# Patient Record
Sex: Female | Born: 1935 | Race: White | Hispanic: No | Marital: Single | Smoking: Never smoker
Health system: Southern US, Community
[De-identification: ages and names within clinical notes are randomized; demographics above are authoritative.]

## PROBLEM LIST (undated history)

## (undated) DIAGNOSIS — J449 Chronic obstructive pulmonary disease, unspecified: Secondary | ICD-10-CM

## (undated) DIAGNOSIS — I10 Essential (primary) hypertension: Secondary | ICD-10-CM

## (undated) DIAGNOSIS — H9190 Unspecified hearing loss, unspecified ear: Secondary | ICD-10-CM

## (undated) DIAGNOSIS — K449 Diaphragmatic hernia without obstruction or gangrene: Secondary | ICD-10-CM

## (undated) DIAGNOSIS — I340 Nonrheumatic mitral (valve) insufficiency: Secondary | ICD-10-CM

## (undated) DIAGNOSIS — Z8679 Personal history of other diseases of the circulatory system: Secondary | ICD-10-CM

## (undated) DIAGNOSIS — I499 Cardiac arrhythmia, unspecified: Secondary | ICD-10-CM

## (undated) DIAGNOSIS — G459 Transient cerebral ischemic attack, unspecified: Secondary | ICD-10-CM

## (undated) DIAGNOSIS — T7840XA Allergy, unspecified, initial encounter: Secondary | ICD-10-CM

## (undated) DIAGNOSIS — H269 Unspecified cataract: Secondary | ICD-10-CM

## (undated) DIAGNOSIS — M810 Age-related osteoporosis without current pathological fracture: Secondary | ICD-10-CM

## (undated) DIAGNOSIS — E785 Hyperlipidemia, unspecified: Secondary | ICD-10-CM

## (undated) DIAGNOSIS — R011 Cardiac murmur, unspecified: Secondary | ICD-10-CM

## (undated) DIAGNOSIS — Z973 Presence of spectacles and contact lenses: Secondary | ICD-10-CM

## (undated) DIAGNOSIS — H409 Unspecified glaucoma: Secondary | ICD-10-CM

## (undated) DIAGNOSIS — Z889 Allergy status to unspecified drugs, medicaments and biological substances status: Secondary | ICD-10-CM

## (undated) DIAGNOSIS — K219 Gastro-esophageal reflux disease without esophagitis: Secondary | ICD-10-CM

## (undated) DIAGNOSIS — Z862 Personal history of diseases of the blood and blood-forming organs and certain disorders involving the immune mechanism: Secondary | ICD-10-CM

## (undated) HISTORY — PX: BREAST SURGERY: SHX581

## (undated) HISTORY — DX: Unspecified cataract: H26.9

## (undated) HISTORY — DX: Transient cerebral ischemic attack, unspecified: G45.9

## (undated) HISTORY — DX: Unspecified glaucoma: H40.9

## (undated) HISTORY — DX: Allergy, unspecified, initial encounter: T78.40XA

## (undated) HISTORY — DX: Hyperlipidemia, unspecified: E78.5

## (undated) HISTORY — DX: Diaphragmatic hernia without obstruction or gangrene: K44.9

## (undated) HISTORY — PX: APPENDECTOMY: SHX54

## (undated) HISTORY — DX: Cardiac murmur, unspecified: R01.1

## (undated) HISTORY — DX: Cardiac arrhythmia, unspecified: I49.9

## (undated) HISTORY — DX: Personal history of diseases of the blood and blood-forming organs and certain disorders involving the immune mechanism: Z86.2

## (undated) HISTORY — DX: Chronic obstructive pulmonary disease, unspecified: J44.9

## (undated) HISTORY — DX: Nonrheumatic mitral (valve) insufficiency: I34.0

## (undated) HISTORY — DX: Allergy status to unspecified drugs, medicaments and biological substances: Z88.9

## (undated) HISTORY — PX: OTHER SURGICAL HISTORY: SHX169

## (undated) HISTORY — DX: Essential (primary) hypertension: I10

## (undated) HISTORY — DX: Gastro-esophageal reflux disease without esophagitis: K21.9

## (undated) HISTORY — DX: Personal history of other diseases of the circulatory system: Z86.79

## (undated) HISTORY — PX: TONSILLECTOMY AND ADENOIDECTOMY: SHX28

## (undated) HISTORY — DX: Age-related osteoporosis without current pathological fracture: M81.0

## (undated) HISTORY — PX: EYE SURGERY: SHX253

---

## 2004-11-23 ENCOUNTER — Emergency Department (HOSPITAL_COMMUNITY): Admission: EM | Admit: 2004-11-23 | Discharge: 2004-11-23 | Payer: Self-pay | Admitting: Emergency Medicine

## 2004-11-23 DIAGNOSIS — K449 Diaphragmatic hernia without obstruction or gangrene: Secondary | ICD-10-CM | POA: Insufficient documentation

## 2006-04-26 HISTORY — PX: CORNEAL TRANSPLANT: SHX108

## 2009-11-01 ENCOUNTER — Encounter: Admission: RE | Admit: 2009-11-01 | Discharge: 2009-11-01 | Payer: Self-pay | Admitting: Cardiology

## 2010-06-11 ENCOUNTER — Ambulatory Visit: Payer: Self-pay | Admitting: Cardiology

## 2010-06-13 ENCOUNTER — Ambulatory Visit: Payer: Self-pay | Admitting: Cardiology

## 2010-06-20 ENCOUNTER — Ambulatory Visit: Payer: Self-pay | Admitting: Cardiology

## 2010-12-24 ENCOUNTER — Ambulatory Visit (INDEPENDENT_AMBULATORY_CARE_PROVIDER_SITE_OTHER): Payer: Medicare Other | Admitting: Cardiology

## 2010-12-24 DIAGNOSIS — E7800 Pure hypercholesterolemia, unspecified: Secondary | ICD-10-CM

## 2010-12-24 DIAGNOSIS — Z79899 Other long term (current) drug therapy: Secondary | ICD-10-CM

## 2010-12-24 DIAGNOSIS — I119 Hypertensive heart disease without heart failure: Secondary | ICD-10-CM

## 2010-12-24 DIAGNOSIS — D509 Iron deficiency anemia, unspecified: Secondary | ICD-10-CM

## 2010-12-25 ENCOUNTER — Ambulatory Visit: Payer: Self-pay | Admitting: Cardiology

## 2011-08-04 ENCOUNTER — Encounter: Payer: Self-pay | Admitting: *Deleted

## 2011-08-04 DIAGNOSIS — Z862 Personal history of diseases of the blood and blood-forming organs and certain disorders involving the immune mechanism: Secondary | ICD-10-CM | POA: Insufficient documentation

## 2011-08-04 DIAGNOSIS — I340 Nonrheumatic mitral (valve) insufficiency: Secondary | ICD-10-CM | POA: Insufficient documentation

## 2011-08-04 DIAGNOSIS — Z889 Allergy status to unspecified drugs, medicaments and biological substances status: Secondary | ICD-10-CM | POA: Insufficient documentation

## 2011-08-04 DIAGNOSIS — Z8679 Personal history of other diseases of the circulatory system: Secondary | ICD-10-CM | POA: Insufficient documentation

## 2011-08-04 DIAGNOSIS — K219 Gastro-esophageal reflux disease without esophagitis: Secondary | ICD-10-CM | POA: Insufficient documentation

## 2011-08-04 DIAGNOSIS — K449 Diaphragmatic hernia without obstruction or gangrene: Secondary | ICD-10-CM | POA: Insufficient documentation

## 2011-08-05 ENCOUNTER — Encounter: Payer: Self-pay | Admitting: Cardiology

## 2011-08-05 ENCOUNTER — Ambulatory Visit (INDEPENDENT_AMBULATORY_CARE_PROVIDER_SITE_OTHER): Payer: Medicare Other | Admitting: Cardiology

## 2011-08-05 ENCOUNTER — Other Ambulatory Visit: Payer: Self-pay | Admitting: *Deleted

## 2011-08-05 VITALS — BP 167/73 | HR 70 | Wt 141.1 lb

## 2011-08-05 DIAGNOSIS — I059 Rheumatic mitral valve disease, unspecified: Secondary | ICD-10-CM

## 2011-08-05 DIAGNOSIS — E7800 Pure hypercholesterolemia, unspecified: Secondary | ICD-10-CM

## 2011-08-05 DIAGNOSIS — I340 Nonrheumatic mitral (valve) insufficiency: Secondary | ICD-10-CM

## 2011-08-05 DIAGNOSIS — I119 Hypertensive heart disease without heart failure: Secondary | ICD-10-CM

## 2011-08-05 DIAGNOSIS — Z862 Personal history of diseases of the blood and blood-forming organs and certain disorders involving the immune mechanism: Secondary | ICD-10-CM

## 2011-08-05 LAB — BASIC METABOLIC PANEL WITH GFR
BUN: 16 mg/dL (ref 6–23)
CO2: 24 meq/L (ref 19–32)
Calcium: 9 mg/dL (ref 8.4–10.5)
Chloride: 112 meq/L (ref 96–112)
Creatinine, Ser: 0.9 mg/dL (ref 0.4–1.2)
GFR: 61.55 mL/min
Glucose, Bld: 101 mg/dL — ABNORMAL HIGH (ref 70–99)
Potassium: 4.9 meq/L (ref 3.5–5.1)
Sodium: 144 meq/L (ref 135–145)

## 2011-08-05 LAB — LIPID PANEL
Cholesterol: 179 mg/dL (ref 0–200)
HDL: 57.8 mg/dL
LDL Cholesterol: 95 mg/dL (ref 0–99)
Total CHOL/HDL Ratio: 3
Triglycerides: 133 mg/dL (ref 0.0–149.0)
VLDL: 26.6 mg/dL (ref 0.0–40.0)

## 2011-08-05 LAB — HEPATIC FUNCTION PANEL
Albumin: 4 g/dL (ref 3.5–5.2)
Alkaline Phosphatase: 62 U/L (ref 39–117)
Total Bilirubin: 0.8 mg/dL (ref 0.3–1.2)

## 2011-08-05 NOTE — Progress Notes (Signed)
[name] Date of Birth:  [birth year] [facility] / [facility] [address]   [address] [place], [place]  [address] [phone]           Fax   [phone]  History of Present Illness: This pleasant 77 year old, Caucasian female is seen for a six-month followup office visit.  She has been widowed since June of 2010.  The patient has a history of essential hypertension.  She also has a history of mitral valve disease with atrial arrhythmias.  She has a history of essential hypertension with diastolic dysfunction.  She is now on lisinopril.  She feels that the lisinopril has helped her palpitations and she has not been aware of any recent palpitations or extrasystoles.  Current Outpatient Prescriptions  Medication Sig Dispense Refill  . aspirin EC 81 MG tablet Take 81 mg by mouth daily.        . Calcium Carbonate-Vitamin D (CALTRATE 600+D PO) Take by mouth.        . cholecalciferol (VITAMIN D) 1000 UNITS tablet Take 1,000 Units by mouth daily.       [name] Calcium (STOOL SOFTENER PO) Take by mouth. Takes prn       . fish oil-omega-3 fatty acids 1000 MG capsule Take 2 g by mouth daily.        Marland Kitchen lisinopril (PRINIVIL,ZESTRIL) 10 MG tablet Take 10 mg by mouth daily.        . methocarbamol (ROBAXIN) 500 MG tablet Take 500 mg by mouth 4 (four) times daily. Takes prn       . Multiple Vitamin (MULTIVITAMIN) tablet Take 1 tablet by mouth daily.        . naproxen sodium (ANAPROX) 220 MG tablet Take 220 mg by mouth 2 (two) times daily with a meal. Takes prn       . prednisoLONE acetate (PRED FORTE) 1 % ophthalmic suspension Place 1 drop into the right eye 2 (two) times daily.       . rosuvastatin (CRESTOR) 10 MG tablet Take 10 mg by mouth daily.        . Timolol Maleate (ISTALOL) 0.5 % (DAILY) SOLN Apply to eye. 1 drop right  eye twice a day-and- 1 drop left eye once a day         Allergies  Allergen Reactions  . Lipitor (Atorvastatin Calcium)   . Zocor (Simvastatin)      Patient Active Problem List  Diagnoses  . Mitral regurgitation  . History of anemia  . History of diastolic dysfunction  . Reflux  . History of seasonal allergies  . Benign hypertensive heart disease without heart failure  . Pure hypercholesterolemia    History  Smoking status  . Never Smoker   Smokeless tobacco  . Not on file    History  Alcohol Use     Family History  Problem Relation Age of Onset  . Heart disease Mother   . Emphysema Father   . Emphysema Brother     Review of Systems: Constitutional: no fever chills diaphoresis or fatigue or change in weight.  Head and neck: no hearing loss, no epistaxis, no photophobia or visual disturbance. Respiratory: No cough, shortness of breath or wheezing. Cardiovascular: No chest pain peripheral edema, palpitations. Gastrointestinal: No abdominal distention, no abdominal pain, no change in bowel habits hematochezia or melena. Genitourinary: No dysuria, no frequency, no urgency, no nocturia. Musculoskeletal:No arthralgias, no back pain, no gait disturbance or myalgias. Neurological: No dizziness, no headaches, no  numbness, no seizures, no syncope, no weakness, no tremors. Hematologic: No lymphadenopathy, no easy bruising. Psychiatric: No confusion, no hallucinations, no sleep disturbance.    Physical Exam: Filed Vitals:   08/05/11 0909  BP: 167/73  Pulse: 70   The general appearance reveals a well-developed, well-nourished, woman, in no distressPupils equal and reactive.   Extraocular Movements are full.  There is no scleral icterus.  The mouth and pharynx are normal.  The neck is supple.  The carotids reveal no bruits.  The jugular venous pressure is normal.  The thyroid is not enlarged.  There is no lymphadenopathy.  The chest is clear to percussion and auscultation. There are no rales or rhonchi. Expansion of the chest is symmetrical.  The precordium is quiet.  The first heart sound is normal.  The second heart  sound is physiologically split.  There is no  gallop rub or click.  There is a faint apical systolic murmur of mitral regurgitation, grade 1/6 There is no abnormal lift or heave.The abdomen is soft and nontender. Bowel sounds are normal. The liver and spleen are not enlarged. There Are no abdominal masses. There are no bruits.  The pedal pulses are good.  There is no phlebitis or edema.  There is no cyanosis or clubbing. Strength is normal and symmetrical in all extremities.  There is no lateralizing weakness.  There are no sensory deficits.  Integument reveals a mole on her left forehead and temple area, but she is concerned about.  The nevus is dark.  She will consult her dermatologist  EKG shows normal sinus rhythm and first degree AV block, unchanged from February 2012  Assessment / Plan: The patient is to continue same medication.  Recheck in 6 months for followup office visit and lab work.

## 2011-08-05 NOTE — Assessment & Plan Note (Signed)
The patient has a history of mitral regurgitation.  She has not been having symptoms of congestive heart failure.  She's not having any orthopnea or paroxysmal nocturnal dyspnea.  His electrocardiogram today which is unchanged from February 2012.  She does have first degree AV block, which is unchanged.

## 2011-08-05 NOTE — Assessment & Plan Note (Signed)
The patient has a past history of anemia.  On a previous visit recheck vitamin B 12 and serum folate and serum iron levels.  All of which were negative.  She has not been aware of any hematochezia or melena.  We will plan to recheck a CBC on her next visit.

## 2011-08-05 NOTE — Patient Instructions (Signed)
continue same dose of medications  Your physician wants you to follow-up in: 6 months You will receive a reminder letter in the mail two months in advance. If you don't receive a letter, please call our office to schedule the follow-up appointment.  

## 2011-08-05 NOTE — Progress Notes (Signed)
Addended by: Alma Friendly on: 08/05/2011 09:59 AM   Modules accepted: Orders

## 2011-08-05 NOTE — Assessment & Plan Note (Signed)
Patient has a history of essential hypertension.  She has been doing better since put her on lisinopril.  This may also have decreased the intensity of her mitral regurgitation.

## 2011-08-08 ENCOUNTER — Telehealth: Payer: Self-pay | Admitting: *Deleted

## 2011-08-08 NOTE — Progress Notes (Signed)
Advised of labs 

## 2011-08-08 NOTE — Telephone Encounter (Signed)
Advised and mailed copy  

## 2011-08-08 NOTE — Telephone Encounter (Signed)
Message copied by Burnell Blanks on Fri Aug 08, 2011  5:34 PM ------      Message from: Cassell Clement      Created: Tue Aug 05, 2011  6:31 PM       Please report.  Her lipids are good.  The cholesterol is 179.  Triglycerides are 133 and the LDL is 95.  The liver and kidney tests are normal.  Continue same medication.

## 2011-08-29 ENCOUNTER — Other Ambulatory Visit: Payer: Self-pay | Admitting: *Deleted

## 2011-08-29 MED ORDER — ROSUVASTATIN CALCIUM 10 MG PO TABS: 10.0000 mg | ORAL_TABLET | Freq: Every day | ORAL | Status: DC

## 2011-10-22 ENCOUNTER — Ambulatory Visit (INDEPENDENT_AMBULATORY_CARE_PROVIDER_SITE_OTHER): Payer: Medicare Other

## 2011-10-22 ENCOUNTER — Telehealth: Payer: Self-pay | Admitting: Cardiology

## 2011-10-22 DIAGNOSIS — J209 Acute bronchitis, unspecified: Secondary | ICD-10-CM

## 2011-10-22 DIAGNOSIS — R059 Cough, unspecified: Secondary | ICD-10-CM

## 2011-10-22 DIAGNOSIS — R05 Cough: Secondary | ICD-10-CM

## 2011-10-22 DIAGNOSIS — D649 Anemia, unspecified: Secondary | ICD-10-CM

## 2011-10-22 NOTE — Telephone Encounter (Signed)
Pt called with increased cough x 2 weeks which is only occasionally slightly productive.  She states she is starting to get sob with exertion and increased coughing.  She states she has been taking Mucinex DM.  I recommended that she call her pcp (doesn't have one), or in absence of not having a pcp to go to Urgent Care this am.

## 2011-10-22 NOTE — Telephone Encounter (Signed)
New message:  Pt has had cold/cough for 2 weeks.  She still is coughing/non productive/but short of breath with any exertion.  No fever to her knowledge.  Has taken Mucinex DM but just does not feel well.Please call and advise.

## 2011-10-29 ENCOUNTER — Telehealth: Payer: Self-pay | Admitting: Cardiology

## 2011-10-29 DIAGNOSIS — R05 Cough: Secondary | ICD-10-CM

## 2011-10-29 NOTE — Telephone Encounter (Signed)
Follow-up:    Patient still is short of breath and has the constant cough after finishing her Z-pak and the urgent care doctor telling her she has bronchitis.  She would like to know how to proceed from here because she isn't getting any better. Please advise.

## 2011-10-29 NOTE — Telephone Encounter (Signed)
Finished Zpak Sunday night.  Dry and wet cough.  Short of breath with the least bit of exertion.  Denies fever.  Xray at [facility] showed "infiltrate" that he said he would call infection.  This started with sore throat on 12/10 and has just gotten worse.  WBC was normal at urgent care.

## 2011-10-29 NOTE — Telephone Encounter (Signed)
Put in order for chest xray and advised patient of xray and office visit

## 2011-10-29 NOTE — Telephone Encounter (Signed)
Her symptoms could be some early CHF.  Consider an office visit and get another chest x-ray ahead of time.  We'll also consider getting a B. natruretic peptide and other labs after we see her.

## 2011-10-30 ENCOUNTER — Encounter: Payer: Self-pay | Admitting: Cardiology

## 2011-10-30 ENCOUNTER — Ambulatory Visit (INDEPENDENT_AMBULATORY_CARE_PROVIDER_SITE_OTHER): Payer: BC Managed Care – PPO | Admitting: Cardiology

## 2011-10-30 ENCOUNTER — Ambulatory Visit (INDEPENDENT_AMBULATORY_CARE_PROVIDER_SITE_OTHER)
Admission: RE | Admit: 2011-10-30 | Discharge: 2011-10-30 | Disposition: A | Discharge status: Home / Self Care | Payer: Medicare Other | Source: Ambulatory Visit | Attending: Cardiology | Admitting: Cardiology

## 2011-10-30 VITALS — BP 124/80 | HR 66 | Ht 63.0 in | Wt 138.0 lb

## 2011-10-30 DIAGNOSIS — J40 Bronchitis, not specified as acute or chronic: Secondary | ICD-10-CM | POA: Diagnosis not present

## 2011-10-30 DIAGNOSIS — R0602 Shortness of breath: Secondary | ICD-10-CM | POA: Diagnosis not present

## 2011-10-30 DIAGNOSIS — R059 Cough, unspecified: Secondary | ICD-10-CM

## 2011-10-30 DIAGNOSIS — I059 Rheumatic mitral valve disease, unspecified: Secondary | ICD-10-CM | POA: Diagnosis not present

## 2011-10-30 DIAGNOSIS — R0989 Other specified symptoms and signs involving the circulatory and respiratory systems: Secondary | ICD-10-CM | POA: Diagnosis not present

## 2011-10-30 DIAGNOSIS — I119 Hypertensive heart disease without heart failure: Secondary | ICD-10-CM

## 2011-10-30 DIAGNOSIS — R05 Cough: Secondary | ICD-10-CM | POA: Diagnosis not present

## 2011-10-30 DIAGNOSIS — I340 Nonrheumatic mitral (valve) insufficiency: Secondary | ICD-10-CM

## 2011-10-30 MED ORDER — AMOXICILLIN 500 MG PO CAPS: 500.0000 mg | ORAL_CAPSULE | Freq: Three times a day (TID) | ORAL | Status: AC

## 2011-10-30 NOTE — Assessment & Plan Note (Signed)
The patient has a history of mitral regurgitation.  She gives a past history of rheumatic fever.  Her echocardiogram 03/19/10 showed mild to moderate mitral regurgitation and normal pulmonary artery pressure.  He has not had atrial fibrillation

## 2011-10-30 NOTE — Patient Instructions (Signed)
Will plan on seeing you around may, letter to be mailed to call for appointment. Amoxil Rx has been sent to [facility] Continue all other medications

## 2011-10-30 NOTE — Progress Notes (Signed)
Tina Mercado Date of Birth:  April 10, 1929 [facility] / [facility] [address]   [address] [place], [place]  [address] [phone]           Fax   [phone]  HPI: This pleasant 78 year old widowed Caucasian female is seen for a work in the office visit.  She has been having trouble getting over a case of severe bronchitis.  She has been sick for more than a week.  She finished up a Zithromax Z-Pak 5 days ago.  She continues to have a harsh mainly nonproductive cough.  On Christmas day she coughs so hard that she almost blacked out.  The following day she went to urgent care at [facility] and a chest x-ray at that time was negative for pneumonia.  She also had lab work at urgent care which was unremarkable including a hemoglobin.  Current Outpatient Prescriptions  Medication Sig Dispense Refill  . aspirin EC 81 MG tablet Take 81 mg by mouth daily.        . Calcium Carbonate-Vitamin D (CALTRATE 600+D PO) Take by mouth.        . cholecalciferol (VITAMIN D) 1000 UNITS tablet Take 1,000 Units by mouth daily.       [name] Calcium (STOOL SOFTENER PO) Take by mouth. Takes prn       . fish oil-omega-3 fatty acids 1000 MG capsule Take 2 g by mouth daily.        Marland Kitchen lisinopril (PRINIVIL,ZESTRIL) 10 MG tablet Take 10 mg by mouth daily.        . methocarbamol (ROBAXIN) 500 MG tablet Take 500 mg by mouth 4 (four) times daily. Takes prn       . Multiple Vitamin (MULTIVITAMIN) tablet Take 1 tablet by mouth daily.        . naproxen sodium (ANAPROX) 220 MG tablet Take 220 mg by mouth 2 (two) times daily with a meal. Takes prn       . prednisoLONE acetate (PRED FORTE) 1 % ophthalmic suspension Place 1 drop into the right eye 2 (two) times daily.       . rosuvastatin (CRESTOR) 10 MG tablet Take 1 tablet (10 mg total) by mouth daily.  30 tablet  6  . Timolol Maleate (ISTALOL) 0.5 % (DAILY) SOLN Apply to eye. 1 drop right  eye twice a day-and- 1 drop left eye once a day       . amoxicillin  (AMOXIL) 500 MG capsule Take 1 capsule (500 mg total) by mouth 3 (three) times daily.  20 capsule  0    Allergies  Allergen Reactions  . Celebrex (Celecoxib)     edema  . Darvocet (Propoxyphene N-Acetaminophen)     nausea  . Demerol     nausea  . Feldene (Piroxicam)     edema  . Lipitor (Atorvastatin Calcium)   . Zocor (Simvastatin)     Patient Active Problem List  Diagnoses  . Mitral regurgitation  . History of anemia  . History of diastolic dysfunction  . Reflux  . History of seasonal allergies  . Benign hypertensive heart disease without heart failure  . Pure hypercholesterolemia    History  Smoking status  . Never Smoker   Smokeless tobacco  . Not on file    History  Alcohol Use     Family History  Problem Relation Age of Onset  . Heart disease Mother   . Emphysema Father   . Emphysema Brother  Review of Systems: The patient denies any heat or cold intolerance.  No weight gain or weight loss.  The patient denies headaches or blurry vision.  There is no cough or sputum production.  The patient denies dizziness.  There is no hematuria or hematochezia.  The patient denies any muscle aches or arthritis.  The patient denies any rash.  The patient denies frequent falling or instability.  There is no history of depression or anxiety.  All other systems were reviewed and are negative.   Physical Exam: Filed Vitals:   10/30/11 1117  BP: 124/80  Pulse: 66    the general appearance reveals a healthy-appearing woman in no distress.  She does have a harsh repetitive nonproductive cough.Pupils equal and reactive.   Extraocular Movements are full.  There is no scleral icterus.  The mouth and pharynx are normal.  The neck is supple.  The carotids reveal no bruits.  The jugular venous pressure is normal.  The thyroid is not enlarged.  There is no lymphadenopathy.  Chest reveals a few rhonchi at the right base.  The heart reveals a soft apical systolic murmur of mitral  regurgitation.The abdomen is soft and nontender. Bowel sounds are normal. The liver and spleen are not enlarged. There Are no abdominal masses. There are no bruits.  The pedal pulses are good.  There is no phlebitis or edema.  There is no cyanosis or clubbing. Strength is normal and symmetrical in all extremities.  There is no lateralizing weakness.  There are no sensory deficits.  Integument reveals no rash   Assessment / Plan: Continue Mucinex 600 mg once or twice a day with plenty of water.  Treat the persistent bronchitis with amoxicillin 500 mg 3 times a day #20.  Continue to get plenty of rest at home.  She will keep her regular appointment is scheduled.

## 2011-10-30 NOTE — Assessment & Plan Note (Signed)
The patient has not been having any symptoms referable to her history of high blood pressure.  Blood pressure today is normal.

## 2011-10-30 NOTE — Assessment & Plan Note (Signed)
Because of her persistent symptoms and cough we repeated a chest x-ray today.  It shows no active cardiopulmonary disease.  Specifically no evidence of congestive heart failure or pneumonia.  He has not been running a fever.  She has not had any hemoptysis.  She does have some pleuritic chest pain.

## 2011-11-24 ENCOUNTER — Telehealth: Payer: Self-pay | Admitting: Cardiology

## 2011-11-24 NOTE — Telephone Encounter (Signed)
Left message, need to schedule for ov

## 2011-11-24 NOTE — Telephone Encounter (Signed)
Yes see  for recheck soon

## 2011-11-24 NOTE — Telephone Encounter (Signed)
?   See for recheck

## 2011-11-24 NOTE — Telephone Encounter (Signed)
Seen for recheck soon

## 2011-11-24 NOTE — Telephone Encounter (Signed)
New problem Pt said her cough is not better and has been having some sob on exertion please call her back

## 2011-11-24 NOTE — Telephone Encounter (Signed)
Appointment scheduled for 1/29

## 2011-11-25 ENCOUNTER — Other Ambulatory Visit: Payer: Self-pay | Admitting: Cardiology

## 2011-11-25 ENCOUNTER — Other Ambulatory Visit: Payer: Self-pay | Admitting: *Deleted

## 2011-11-25 ENCOUNTER — Encounter: Payer: Self-pay | Admitting: Cardiology

## 2011-11-25 ENCOUNTER — Ambulatory Visit (INDEPENDENT_AMBULATORY_CARE_PROVIDER_SITE_OTHER): Payer: Medicare Other | Admitting: Cardiology

## 2011-11-25 DIAGNOSIS — E7800 Pure hypercholesterolemia, unspecified: Secondary | ICD-10-CM | POA: Diagnosis not present

## 2011-11-25 DIAGNOSIS — J40 Bronchitis, not specified as acute or chronic: Secondary | ICD-10-CM

## 2011-11-25 DIAGNOSIS — I119 Hypertensive heart disease without heart failure: Secondary | ICD-10-CM | POA: Diagnosis not present

## 2011-11-25 NOTE — Patient Instructions (Signed)
Your physician recommends that you schedule a follow-up appointment in: 3 months Your physician recommends that you continue on your current medications as directed. Please refer to the Current Medication list given to you today. If no better or worse call back

## 2011-11-25 NOTE — Progress Notes (Signed)
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  We had seen her for a work in on 10/30/11 because of a severe cough.  He has a past history of essential hypertension and history of mitral regurgitation.  He has not had clinical congestive heart failure.  Current Outpatient Prescriptions  Medication Sig Dispense Refill  . aspirin EC 81 MG tablet Take 81 mg by mouth daily.        . Calcium Carbonate-Vitamin D (CALTRATE 600+D PO) Take 1 tablet by mouth daily.       . cholecalciferol (VITAMIN D) 1000 UNITS tablet Take 1,000 Units by mouth daily.       [name] Calcium (STOOL SOFTENER PO) Take by mouth. Takes prn       . fish oil-omega-3 fatty acids 1000 MG capsule Take 2 g by mouth daily.        Marland Kitchen lisinopril (PRINIVIL,ZESTRIL) 10 MG tablet TAKE 1 TABLET EVERY DAY  30 tablet  5  . methocarbamol (ROBAXIN) 500 MG tablet Take 500 mg by mouth 4 (four) times daily. Takes prn       . Multiple Vitamin (MULTIVITAMIN) tablet Take 1 tablet by mouth daily.        . naproxen sodium (ANAPROX) 220 MG tablet Take 220 mg by mouth 2 (two) times daily with a meal. Takes prn       . prednisoLONE acetate (PRED FORTE) 1 % ophthalmic suspension Place 1 drop into the right eye 2 (two) times daily.       . rosuvastatin (CRESTOR) 10 MG tablet Take 1 tablet (10 mg total) by mouth daily.  30 tablet  6  . Timolol Maleate (ISTALOL) 0.5 % (DAILY) SOLN Apply to eye. 1 drop right  eye twice a day-and- 1 drop left eye once a day         Allergies  Allergen Reactions  . Celebrex (Celecoxib)     edema  . Darvocet (Propoxyphene N-Acetaminophen)     nausea  . Demerol     nausea  . Feldene (Piroxicam)     edema  . Lipitor (Atorvastatin Calcium)   . Zocor (Simvastatin)     Patient Active Problem List  Diagnoses  .  Mitral regurgitation  . History of anemia  . History of diastolic dysfunction  . Reflux  . History of seasonal allergies  . Benign hypertensive heart disease without heart failure  . Pure hypercholesterolemia  . Bronchitis    History  Smoking status  . Never Smoker   Smokeless tobacco  . Not on file    History  Alcohol Use     Family History  Problem Relation Age of Onset  . Heart disease Mother   . Emphysema Father   . Emphysema Brother     Review of Systems: The patient denies any heat or cold intolerance.  No weight gain or weight loss.  The patient denies headaches or blurry vision.  There is no cough or sputum production.  The patient denies dizziness.  There is no hematuria or hematochezia.  The patient denies any muscle aches or arthritis.  The patient denies any rash.  The patient denies frequent falling or instability.  There is no history of depression or anxiety.  All other systems were reviewed and are negative.   Physical  Exam: Filed Vitals:   11/25/11 1020  BP: 154/78  Pulse: 74   the general appearance reveals a well-developed well-nourished woman in no distress.  She is having paroxysms of dry nonproductive cough.Pupils equal and reactive.   Extraocular Movements are full.  There is no scleral icterus.  The mouth and pharynx are normal.  The neck is supple.  The carotids reveal no bruits.  The jugular venous pressure is normal.  The thyroid is not enlarged.  There is no lymphadenopathy.  The chest is clear to percussion and auscultation. There are no rales or rhonchi. Expansion of the chest is symmetrical.  The precordium is quiet.  The first heart sound is normal.  The second heart sound is physiologically split.  There is no gallop rub or click.  There is a faint apical systolic murmur. There is no abnormal lift or heave.The abdomen is soft and nontender. Bowel sounds are normal. The liver and spleen are not enlarged. There Are no abdominal masses. There are  no bruits.  The pedal pulses are good.  There is no phlebitis or edema.  There is no cyanosis or clubbing. Strength is normal and symmetrical in all extremities.  There is no lateralizing weakness.  There are no sensory deficits.  The skin is warm and dry.  There is no rash.       Assessment / Plan: She will restart Mucinex 600 mg twice a day.  If she needs cough suppression she will use Mucinex DM.  For her blood pressure she is to decrease salt intake in her diet.  Will continue same dose of lisinopril for now but consider switch to an ARB if cough persists.  Recheck in 3 months for followup office visit.  She has had 2 recent chest x-rays so there is no need for any further chest x-ray at this time.  Hopefully her cough will just gradually wear itself out.

## 2011-11-25 NOTE — Assessment & Plan Note (Signed)
The patient has not been having any symptoms referable to her blood pressure.  Blood pressure has been mildly elevated since last visit.  No headaches or dizzy spells.  No symptoms of CHF.

## 2011-11-25 NOTE — Assessment & Plan Note (Signed)
The patient continues to have a severe paroxysmal nonproductive cough.  She has had several rounds of antibiotics.  Her most recent round was amoxicillin which seemed to help while she was on it but several days after stopping in her cough returned.  She is now bringing up any sputum but her cough sounds wet to her.  We discussed the possibility that her ACE inhibitor could be a contributing factor but she felt that to be unlikely since she's been on the ACE inhibitor for a long time and also was not having any cough until she had the onset of clinical bronchitis last month.  If the cough persists then we will consider the option of switching her off her ACE inhibitor and using an ARB instead.

## 2011-11-25 NOTE — Telephone Encounter (Signed)
Fax Received. Refill Completed. Bristol Osentoski Chowoe (R.M.A)   

## 2011-11-25 NOTE — Assessment & Plan Note (Signed)
Patient has a history of hypercholesterolemia and is on Crestor 10 mg daily.  She's not having any side effects Crestor.

## 2012-01-27 DIAGNOSIS — B079 Viral wart, unspecified: Secondary | ICD-10-CM | POA: Diagnosis not present

## 2012-01-27 DIAGNOSIS — D235 Other benign neoplasm of skin of trunk: Secondary | ICD-10-CM | POA: Diagnosis not present

## 2012-01-27 DIAGNOSIS — Z85828 Personal history of other malignant neoplasm of skin: Secondary | ICD-10-CM | POA: Diagnosis not present

## 2012-02-24 ENCOUNTER — Encounter: Payer: Self-pay | Admitting: Cardiology

## 2012-02-24 ENCOUNTER — Ambulatory Visit (INDEPENDENT_AMBULATORY_CARE_PROVIDER_SITE_OTHER): Payer: Medicare Other | Admitting: Cardiology

## 2012-02-24 VITALS — BP 148/90 | HR 60 | Ht 63.0 in | Wt 136.8 lb

## 2012-02-24 DIAGNOSIS — I059 Rheumatic mitral valve disease, unspecified: Secondary | ICD-10-CM

## 2012-02-24 DIAGNOSIS — M810 Age-related osteoporosis without current pathological fracture: Secondary | ICD-10-CM

## 2012-02-24 DIAGNOSIS — E7800 Pure hypercholesterolemia, unspecified: Secondary | ICD-10-CM | POA: Diagnosis not present

## 2012-02-24 DIAGNOSIS — I340 Nonrheumatic mitral (valve) insufficiency: Secondary | ICD-10-CM

## 2012-02-24 DIAGNOSIS — I4949 Other premature depolarization: Secondary | ICD-10-CM | POA: Diagnosis not present

## 2012-02-24 DIAGNOSIS — I493 Ventricular premature depolarization: Secondary | ICD-10-CM

## 2012-02-24 DIAGNOSIS — I119 Hypertensive heart disease without heart failure: Secondary | ICD-10-CM

## 2012-02-24 LAB — BASIC METABOLIC PANEL
BUN: 14 mg/dL (ref 6–23)
Chloride: 110 mEq/L (ref 96–112)
Glucose, Bld: 106 mg/dL — ABNORMAL HIGH (ref 70–99)
Potassium: 3.8 mEq/L (ref 3.5–5.1)

## 2012-02-24 NOTE — Progress Notes (Signed)
Tina Mercado Date of Birth:  [birth year] [facility] [address] [place], [place]  [address] [phone]         Fax   [phone]  History of Present Illness: This pleasant 78 year old woman is seen for a scheduled followup office visit.  She has a past history of mitral regurgitation and a history of essential hypertension.  She's also had a history of hypercholesterolemia.  Since last visit she has had no new cardiac symptoms.  She has not been getting any regular exercise.  She denies any chest pain.  She has noted some palpitations.  She has been drinking occasional cups of regular coffee.  Current Outpatient Prescriptions  Medication Sig Dispense Refill  . aspirin EC 81 MG tablet Take 81 mg by mouth daily.        . Calcium Carbonate-Vitamin D (CALTRATE 600+D PO) Take 1 tablet by mouth daily.       . cholecalciferol (VITAMIN D) 1000 UNITS tablet Take 1,000 Units by mouth daily.       [name] Calcium (STOOL SOFTENER PO) Take by mouth. Takes prn       . fish oil-omega-3 fatty acids 1000 MG capsule Take 2 g by mouth daily.        Marland Kitchen lisinopril (PRINIVIL,ZESTRIL) 10 MG tablet TAKE 1 TABLET EVERY DAY  30 tablet  5  . methocarbamol (ROBAXIN) 500 MG tablet Take 500 mg by mouth 4 (four) times daily. Takes prn       . Multiple Vitamin (MULTIVITAMIN) tablet Take 1 tablet by mouth daily.        . naproxen sodium (ANAPROX) 220 MG tablet Take 220 mg by mouth 2 (two) times daily with a meal. Takes prn       . prednisoLONE acetate (PRED FORTE) 1 % ophthalmic suspension Place 1 drop into the right eye 2 (two) times daily.       . rosuvastatin (CRESTOR) 10 MG tablet Take 1 tablet (10 mg total) by mouth daily.  30 tablet  6  . Timolol Maleate (ISTALOL) 0.5 % (DAILY) SOLN Apply to eye. 1 drop right  eye twice a day-and- 1 drop left eye once a day         Allergies  Allergen Reactions  . Celebrex (Celecoxib)     edema  . Darvocet (Propoxyphene-Acetaminophen)    nausea  . Demerol     nausea  . Feldene (Piroxicam)     edema  . Lipitor (Atorvastatin Calcium)   . Zocor (Simvastatin)     Patient Active Problem List  Diagnoses  . Mitral regurgitation  . History of anemia  . History of diastolic dysfunction  . Reflux  . History of seasonal allergies  . Benign hypertensive heart disease without heart failure  . Pure hypercholesterolemia  . Bronchitis    History  Smoking status  . Never Smoker   Smokeless tobacco  . Not on file    History  Alcohol Use     Family History  Problem Relation Age of Onset  . Heart disease Mother   . Emphysema Father   . Emphysema Brother     Review of Systems: Constitutional: no fever chills diaphoresis or fatigue or change in weight.  Head and neck: no hearing loss, no epistaxis, no photophobia or visual disturbance. Respiratory: No cough, shortness of breath or wheezing. Cardiovascular: No chest pain peripheral edema, palpitations. Gastrointestinal: No abdominal distention, no abdominal pain, no change in bowel habits hematochezia or melena.  Genitourinary: No dysuria, no frequency, no urgency, no nocturia. Musculoskeletal:No arthralgias, no back pain, no gait disturbance or myalgias. Neurological: No dizziness, no headaches, no numbness, no seizures, no syncope, no weakness, no tremors. Hematologic: No lymphadenopathy, no easy bruising. Psychiatric: No confusion, no hallucinations, no sleep disturbance.    Physical Exam: Filed Vitals:   02/24/12 1051  BP: 148/90  Pulse: 60   the general appearance reveals a well-developed well-nourished woman in no distress.Pupils equal and reactive.   Extraocular Movements are full.  There is no scleral icterus.  The mouth and pharynx are normal.  The neck is supple.  The carotids reveal no bruits.  The jugular venous pressure is normal.  The thyroid is not enlarged.  There is no lymphadenopathy.  The chest is clear to percussion and auscultation. There are  no rales or rhonchi. Expansion of the chest is symmetrical.  The heart reveals an irregular rhythm in bigeminy.  She has a soft apical systolic murmur.  No gallop or rub. The abdomen is soft and nontender. Bowel sounds are normal. The liver and spleen are not enlarged. There Are no abdominal masses. There are no bruits.  The pedal pulses are good.  There is no phlebitis or edema.  There is no cyanosis or clubbing. Strength is normal and symmetrical in all extremities.  There is no lateralizing weakness.  There are no sensory deficits.     Assessment / Plan: We are checking a basal metabolic panel and a magnesium level today because of the frequent PVCs. She will return in 4 months for followup office visit and fasting lipid panel hepatic function panel and basal metabolic panel

## 2012-02-24 NOTE — Progress Notes (Signed)
Quick Note:  Please report to patient. The recent labs are stable. Continue same medication and careful diet. K and Mg are normal. ______

## 2012-02-24 NOTE — Assessment & Plan Note (Signed)
Patient has been increasing her caffeine intake recently.  She has a new coffeemaker at home and had run out of decaffeinated coffee.  She has noted increased premature beats.  EKG today shows that she is in normal sinus rhythm with PVCs in bigeminy.  She will cut out the coffee and also try to increase regular exercise by joining [employer] and starting to exercise on a regular basis

## 2012-02-24 NOTE — Patient Instructions (Signed)
Your Physician has recommended:   We check your labs today: BMET and Magnesium   Try to decrease your caffeine intake and exercise more.   We will see you back in 4 months with fasting labs

## 2012-02-24 NOTE — Assessment & Plan Note (Signed)
The patient has not been experiencing any symptoms of congestive heart failure 

## 2012-02-24 NOTE — Assessment & Plan Note (Signed)
Patient has a history of hypercholesterolemia.  She is on Crestor 10 mg daily.  She is not having any side effects from the Crestor.  We will check her lipids at her next visit.

## 2012-02-25 ENCOUNTER — Telehealth: Payer: Self-pay | Admitting: *Deleted

## 2012-02-25 NOTE — Telephone Encounter (Signed)
Mailed copy at request of patient

## 2012-02-25 NOTE — Telephone Encounter (Signed)
Message copied by Burnell Blanks on Wed Feb 25, 2012 10:41 AM ------      Message from: Cassell Clement      Created: Tue Feb 24, 2012  8:29 PM       Please report to patient.  The recent labs are stable. Continue same medication and careful diet. K and Mg are normal.

## 2012-04-19 DIAGNOSIS — H2510 Age-related nuclear cataract, unspecified eye: Secondary | ICD-10-CM | POA: Diagnosis not present

## 2012-04-19 DIAGNOSIS — H40059 Ocular hypertension, unspecified eye: Secondary | ICD-10-CM | POA: Diagnosis not present

## 2012-04-19 DIAGNOSIS — H1820 Unspecified corneal edema: Secondary | ICD-10-CM | POA: Diagnosis not present

## 2012-04-22 ENCOUNTER — Other Ambulatory Visit: Payer: Self-pay | Admitting: Cardiology

## 2012-04-22 NOTE — Telephone Encounter (Signed)
Refilled crestor 

## 2012-05-12 ENCOUNTER — Other Ambulatory Visit: Payer: Self-pay | Admitting: Cardiology

## 2012-05-12 NOTE — Telephone Encounter (Signed)
Refilled robaxin 

## 2012-06-21 ENCOUNTER — Other Ambulatory Visit: Payer: Self-pay | Admitting: Cardiology

## 2012-06-29 ENCOUNTER — Encounter: Payer: Self-pay | Admitting: Cardiology

## 2012-06-29 ENCOUNTER — Other Ambulatory Visit (INDEPENDENT_AMBULATORY_CARE_PROVIDER_SITE_OTHER): Payer: Medicare Other

## 2012-06-29 ENCOUNTER — Ambulatory Visit (INDEPENDENT_AMBULATORY_CARE_PROVIDER_SITE_OTHER): Payer: Medicare Other | Admitting: Cardiology

## 2012-06-29 VITALS — BP 150/68 | HR 70 | Ht 63.0 in | Wt 134.0 lb

## 2012-06-29 DIAGNOSIS — R0989 Other specified symptoms and signs involving the circulatory and respiratory systems: Secondary | ICD-10-CM

## 2012-06-29 DIAGNOSIS — E559 Vitamin D deficiency, unspecified: Secondary | ICD-10-CM | POA: Diagnosis not present

## 2012-06-29 DIAGNOSIS — I059 Rheumatic mitral valve disease, unspecified: Secondary | ICD-10-CM

## 2012-06-29 DIAGNOSIS — E7800 Pure hypercholesterolemia, unspecified: Secondary | ICD-10-CM

## 2012-06-29 DIAGNOSIS — I119 Hypertensive heart disease without heart failure: Secondary | ICD-10-CM

## 2012-06-29 DIAGNOSIS — I493 Ventricular premature depolarization: Secondary | ICD-10-CM

## 2012-06-29 DIAGNOSIS — I4949 Other premature depolarization: Secondary | ICD-10-CM

## 2012-06-29 DIAGNOSIS — I340 Nonrheumatic mitral (valve) insufficiency: Secondary | ICD-10-CM

## 2012-06-29 LAB — BASIC METABOLIC PANEL
CO2: 25 mEq/L (ref 19–32)
Calcium: 9.4 mg/dL (ref 8.4–10.5)
Chloride: 105 mEq/L (ref 96–112)
Creatinine, Ser: 1 mg/dL (ref 0.4–1.2)
Sodium: 138 mEq/L (ref 135–145)

## 2012-06-29 LAB — LIPID PANEL
Cholesterol: 159 mg/dL (ref 0–200)
HDL: 48.9 mg/dL (ref >39.00)
LDL Cholesterol: 84 mg/dL (ref 0–99)
Total CHOL/HDL Ratio: 3
Triglycerides: 131 mg/dL (ref 0.0–149.0)

## 2012-06-29 LAB — HEPATIC FUNCTION PANEL
Albumin: 3.8 g/dL (ref 3.5–5.2)
Alkaline Phosphatase: 52 U/L (ref 39–117)
Total Protein: 6.8 g/dL (ref 6.0–8.3)

## 2012-06-29 NOTE — Progress Notes (Signed)
Quick Note:  Please report to patient. The recent labs are stable. Continue same medication and careful diet. ______ 

## 2012-06-29 NOTE — Assessment & Plan Note (Signed)
Her heart rhythm today his regular.  We did not do an EKG.

## 2012-06-29 NOTE — Assessment & Plan Note (Signed)
Patient is on Crestor for her hypercholesterolemia.  She is tolerating it well without side effects.  We are checking lab work today

## 2012-06-29 NOTE — Progress Notes (Signed)
Tina Mercado Date of Birth:  Mar 14, 1929 [facility] [address] [place], [place]  [address] [phone]         Fax   [phone]  History of Present Illness: This pleasant 78 year old, Caucasian female is seen for a four-month followup office visit. She has been widowed since June of 2010. The patient has a history of essential hypertension. She also has a history of mitral valve disease with atrial arrhythmias. She has a history of essential hypertension with diastolic dysfunction. She is now on lisinopril. She feels that the lisinopril has helped her palpitations and she has not been aware of any recent palpitations or extrasystoles.  Previously she had had some problem with extrasystoles which resolved when she cut out drinking caffeinated coffee.   Current Outpatient Prescriptions  Medication Sig Dispense Refill  . aspirin EC 81 MG tablet Take 81 mg by mouth daily.        . Calcium Carbonate-Vitamin D (CALTRATE 600+D PO) Take 1 tablet by mouth daily.       . cholecalciferol (VITAMIN D) 1000 UNITS tablet Take 1,000 Units by mouth daily.       . CRESTOR 10 MG tablet TAKE 1 TABLET (10 MG TOTAL) BY MOUTH DAILY.  30 tablet  6  . Docusate Calcium (STOOL SOFTENER PO) Take by mouth. Takes prn       . fish oil-omega-3 fatty acids 1000 MG capsule Take 2 g by mouth daily.        Marland Kitchen lisinopril (PRINIVIL,ZESTRIL) 10 MG tablet TAKE 1 TABLET EVERY DAY  30 tablet  5  . Multiple Vitamin (MULTIVITAMIN) tablet Take 1 tablet by mouth daily.        . naproxen sodium (ANAPROX) 220 MG tablet Take 220 mg by mouth 2 (two) times daily with a meal. Takes prn       . prednisoLONE acetate (PRED FORTE) 1 % ophthalmic suspension Place 1 drop into the right eye 2 (two) times daily.       Marland Kitchen ROBAXIN 500 MG tablet TAKE 1 TABLET EVERY 6 HOURS AS NEEDED FOR MUSCLE SPASM  50 tablet  1  . Timolol Maleate (ISTALOL) 0.5 % (DAILY) SOLN Apply to eye. 1 drop right  eye twice a day-and- 1 drop left  eye once a day         Allergies  Allergen Reactions  . Celebrex (Celecoxib)     edema  . Darvocet (Propoxyphene-Acetaminophen)     nausea  . Demerol     nausea  . Feldene (Piroxicam)     edema  . Lipitor (Atorvastatin Calcium)   . Zocor (Simvastatin)     Patient Active Problem List  Diagnosis  . Mitral regurgitation  . History of anemia  . History of diastolic dysfunction  . Reflux  . History of seasonal allergies  . Benign hypertensive heart disease without heart failure  . Pure hypercholesterolemia  . Bronchitis  . Frequent unifocal PVCs    History  Smoking status  . Never Smoker   Smokeless tobacco  . Not on file    History  Alcohol Use     Family History  Problem Relation Age of Onset  . Heart disease Mother   . Emphysema Father   . Emphysema Brother     Review of Systems: Constitutional: no fever chills diaphoresis or fatigue or change in weight.  Head and neck: no hearing loss, no epistaxis, no photophobia or visual disturbance. Respiratory: No cough, shortness of breath  or wheezing. Cardiovascular: No chest pain peripheral edema, palpitations. Gastrointestinal: No abdominal distention, no abdominal pain, no change in bowel habits hematochezia or melena. Genitourinary: No dysuria, no frequency, no urgency, no nocturia. Musculoskeletal:No arthralgias, no back pain, no gait disturbance or myalgias. Neurological: No dizziness, no headaches, no numbness, no seizures, no syncope, no weakness, no tremors. Hematologic: No lymphadenopathy, no easy bruising. Psychiatric: No confusion, no hallucinations, no sleep disturbance.    Physical Exam: Filed Vitals:   06/29/12 1028  BP: 150/68  Pulse: 70   the general appearance reveals a well-developed well-nourished woman in no distress.The head and neck exam reveals pupils equal and reactive.  Extraocular movements are full.  There is no scleral icterus.  The mouth and pharynx are normal.  The neck is supple.   The carotids reveal no bruits.  The jugular venous pressure is normal.  The  thyroid is not enlarged.  There is no lymphadenopathy.  The chest is clear to percussion and auscultation.  There are no rales or rhonchi.  Expansion of the chest is symmetrical.  The precordium is quiet.  The first heart sound is normal.  The second heart sound is physiologically split.  There is no murmur gallop rub or click.  There is no abnormal lift or heave.  The abdomen is soft and nontender.  The bowel sounds are normal.  The liver and spleen are not enlarged.  There are no abdominal masses.  There are no abdominal bruits.  Extremities reveal good pedal pulses.  There is no phlebitis or edema.  There is no cyanosis or clubbing.  Strength is normal and symmetrical in all extremities.  There is no lateralizing weakness.  There are no sensory deficits.  The skin is warm and dry.  There is no rash.     Assessment / Plan: The patient is to continue same medication.  Continue to avoid caffeine.  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  We will send her copies of today's lab work for her to use for her preop labs.  Her surgery will be at [facility] here in 4 months for followup office visit lipid panel hepatic function panel and basal metabolic panel.  Today we are also checking a vitamin D level.  She takes vitamin D 2000 units a day and wonders whether that is too much.

## 2012-06-29 NOTE — Assessment & Plan Note (Signed)
The patient has not been experiencing any recent chest pain or increased shortness of breath.  No symptoms of CHF

## 2012-06-29 NOTE — Patient Instructions (Addendum)
Your physician recommends that you continue on your current medications as directed. Please refer to the Current Medication list given to you today.  Your physician wants you to follow-up in: 4 months with fasting labs (lp/bmet/hfp)  You will receive a reminder letter in the mail two months in advance. If you don't receive a letter, please call our office to schedule the follow-up appointment.   Will obtain labs today and call you with the results (lp/bmet/hfp//vitamin d)

## 2012-06-29 NOTE — Assessment & Plan Note (Signed)
Blood pressure initially was elevated.  On recheck her blood pressure had come down to 132/80.  She has not been experiencing any dizzy spells or headaches.

## 2012-07-06 ENCOUNTER — Telehealth: Payer: Self-pay | Admitting: *Deleted

## 2012-07-06 NOTE — Telephone Encounter (Signed)
Mailed copy of labs and left message to call if any questions  

## 2012-07-06 NOTE — Telephone Encounter (Signed)
Message copied by Burnell Blanks on Tue Jul 06, 2012  4:16 PM ------      Message from: Cassell Clement      Created: Tue Jun 29, 2012  9:12 PM       Please report to patient.  The recent labs are stable. Continue same medication and careful diet.

## 2012-07-08 DIAGNOSIS — I059 Rheumatic mitral valve disease, unspecified: Secondary | ICD-10-CM | POA: Diagnosis not present

## 2012-07-08 DIAGNOSIS — E785 Hyperlipidemia, unspecified: Secondary | ICD-10-CM | POA: Diagnosis not present

## 2012-07-08 DIAGNOSIS — T85398A Other mechanical complication of other ocular prosthetic devices, implants and grafts, initial encounter: Secondary | ICD-10-CM | POA: Diagnosis not present

## 2012-07-08 DIAGNOSIS — I10 Essential (primary) hypertension: Secondary | ICD-10-CM | POA: Diagnosis not present

## 2012-07-09 DIAGNOSIS — T85398A Other mechanical complication of other ocular prosthetic devices, implants and grafts, initial encounter: Secondary | ICD-10-CM | POA: Diagnosis not present

## 2012-07-09 DIAGNOSIS — I059 Rheumatic mitral valve disease, unspecified: Secondary | ICD-10-CM | POA: Diagnosis not present

## 2012-07-09 DIAGNOSIS — I10 Essential (primary) hypertension: Secondary | ICD-10-CM | POA: Diagnosis not present

## 2012-07-09 DIAGNOSIS — E785 Hyperlipidemia, unspecified: Secondary | ICD-10-CM | POA: Diagnosis not present

## 2012-10-07 DIAGNOSIS — H2510 Age-related nuclear cataract, unspecified eye: Secondary | ICD-10-CM | POA: Diagnosis not present

## 2012-10-07 DIAGNOSIS — H35359 Cystoid macular degeneration, unspecified eye: Secondary | ICD-10-CM | POA: Diagnosis not present

## 2012-10-08 DIAGNOSIS — T85398A Other mechanical complication of other ocular prosthetic devices, implants and grafts, initial encounter: Secondary | ICD-10-CM | POA: Diagnosis not present

## 2012-10-08 DIAGNOSIS — H35359 Cystoid macular degeneration, unspecified eye: Secondary | ICD-10-CM | POA: Diagnosis not present

## 2012-10-29 ENCOUNTER — Ambulatory Visit: Payer: Medicare Other

## 2012-10-29 ENCOUNTER — Ambulatory Visit (INDEPENDENT_AMBULATORY_CARE_PROVIDER_SITE_OTHER): Payer: Medicare Other | Admitting: Emergency Medicine

## 2012-10-29 VITALS — BP 128/86 | HR 83 | Temp 98.0°F | Resp 17 | Ht 63.0 in | Wt 140.0 lb

## 2012-10-29 DIAGNOSIS — R05 Cough: Secondary | ICD-10-CM | POA: Diagnosis not present

## 2012-10-29 DIAGNOSIS — K449 Diaphragmatic hernia without obstruction or gangrene: Secondary | ICD-10-CM | POA: Diagnosis not present

## 2012-10-29 NOTE — Patient Instructions (Signed)
Hiatal Hernia A hiatal hernia occurs when a part of the stomach slides above the diaphragm. The diaphragm is the thin muscle separating the belly (abdomen) from the chest. A hiatal hernia can be something you are born with or develop over time. Hiatal hernias may allow stomach acid to flow back into your esophagus, the tube which carries food from your mouth to your stomach. If this acid causes problems it is called GERD (gastro-esophageal reflux disease).  SYMPTOMS  Common symptoms of GERD are heartburn (burning in your chest). This is worse when lying down or bending over. It may also cause belching and indigestion. Some of the things which make GERD worse are:  Increased weight pushes on stomach making acid rise more easily.  Smoking markedly increases acid production.  Alcohol decreases lower esophageal sphincter pressure (valve between stomach and esophagus), allowing acid from stomach into esophagus.  Late evening meals and going to bed with a full stomach increases pressure.  Anything that causes an increase in acid production.  Lower esophageal sphincter incompetence. DIAGNOSIS  Hiatal hernia is often diagnosed with x-rays of your stomach and small bowel. This is called an UGI (upper gastrointestinal x-ray). Sometimes a gastroscopic procedure is done. This is a procedure where your caregiver uses a flexible instrument to look into the stomach and small bowel. HOME CARE INSTRUCTIONS   Try to achieve and maintain an ideal body weight.  Avoid drinking alcoholic beverages.  Stop smoking.  Put the head of your bed on 4 to 6 inch blocks. This will keep your head and esophagus higher than your stomach. If you cannot use blocks, sleep with several pillows under your head and shoulders.  Over-the-counter medications will decrease acid production. Your caregiver can also prescribe medications for this. Take as directed.  1/2 to 1 teaspoon of an antacid taken every hour while awake, with  meals and at bedtime, will neutralize acid.  Do not take aspirin, ibuprofen (Advil or Motrin), or other nonsteroidal anti-inflammatory drugs.  Do not wear tight clothing around your chest or stomach.  Eat smaller meals and eat more frequently. This keeps your stomach from getting too full. Eat slowly.  Do not lie down for 2 or 3 hours after eating. Do not eat or drink anything 1 to 2 hours before going to bed.  Avoid caffeine beverages (colas, coffee, cocoa, tea), fatty foods, citrus fruits and all other foods and drinks that contain acid and that seem to increase the problems.  Avoid bending over, especially after eating. Also avoid straining during bowel movements or when urinating or lifting things. Anything that increases the pressure in your belly increases the amount of acid that may be pushed up into your esophagus. SEEK IMMEDIATE MEDICAL CARE IF:  There is change in location (pain in arms, neck, jaw, teeth or back) of your pain, or the pain is getting worse.  You also experience nausea, vomiting, sweating (diaphoresis), or shortness of breath.  You develop continual vomiting, vomit blood or coffee ground material, have bright red blood in your stools, or have black tarry stools. Some of these symptoms could signal other problems such as heart disease. MAKE SURE YOU:   Understand these instructions.  Monitor your condition.  Contact your caregiver if you are not doing well or are getting worse. Document Released: 01/03/2004 Document Revised: 01/05/2012 Document Reviewed: 10/13/2005 [facility] Patient Information 2013 [place], [place].

## 2012-10-29 NOTE — Progress Notes (Signed)
[facility] [address], [place] [address] [phone]  Date:  10/29/2012   Name:  Tina Mercado   DOB:  Jul 05, 1929   MRN:  [id number]  PCP:  No primary provider on file.    Chief Complaint: Shortness of Breath, Cough and Nasal Congestion   History of Present Illness:  Tina Mercado is a 79 y.o. very pleasant female patient who presents with the following:  Illl with an increasing shortness of breath over the past three months. Has noted a cough productive of only scant sputum over past three weeks.  No wheezing.  No nasal congestion with only clear nasal discharge.  No nausea or vomiting.  No stool change or rash.  No chest pain or peripheral edema.  Today came as she is picking up her invalid sister for a weeklong visit and wants to insure that she is not going to need to leave sis at home alone.  Patient Active Problem List  Diagnosis  . Mitral regurgitation  . History of anemia  . History of diastolic dysfunction  . Reflux  . History of seasonal allergies  . Benign hypertensive heart disease without heart failure  . Pure hypercholesterolemia  . Bronchitis  . Frequent unifocal PVCs    Past Medical History  Diagnosis Date  . Hypertension   . Mitral regurgitation     mild tomoderate  . History of anemia   . History of diastolic dysfunction   . Arrhythmia   . Heart murmur   . Hyperlipidemia   . Reflux   . History of seasonal allergies   . Allergy   . Cataract   . Glaucoma   . Osteoporosis     Past Surgical History  Procedure Date  . Corneal transplant july 2007  . Tonsillectomy and adenoidectomy     age 46yrs  . Appendectomy   . Childbirth     x 1  . Breast surgery   . Eye surgery     History  Substance Use Topics  . Smoking status: Never Smoker   . Smokeless tobacco: Not on file  . Alcohol Use: No    Family History  Problem Relation Age of Onset  . Heart disease Mother   . Emphysema Father   . Emphysema Brother    . COPD Brother     Allergies  Allergen Reactions  . Celebrex (Celecoxib)     edema  . Darvocet (Propoxyphene-Acetaminophen)     nausea  . Demerol     nausea  . Feldene (Piroxicam)     edema  . Lipitor (Atorvastatin Calcium)   . Zocor (Simvastatin)     Medication list has been reviewed and updated.  Current Outpatient Prescriptions on File Prior to Visit  Medication Sig Dispense Refill  . aspirin EC 81 MG tablet Take 81 mg by mouth daily.        . Calcium Carbonate-Vitamin D (CALTRATE 600+D PO) Take 1 tablet by mouth daily.       . cholecalciferol (VITAMIN D) 1000 UNITS tablet Take 1,000 Units by mouth daily.       . CRESTOR 10 MG tablet TAKE 1 TABLET (10 MG TOTAL) BY MOUTH DAILY.  30 tablet  6  . Docusate Calcium (STOOL SOFTENER PO) Take by mouth. Takes prn       . fish oil-omega-3 fatty acids 1000 MG capsule Take 2 g by mouth daily.        Marland Kitchen lisinopril (PRINIVIL,ZESTRIL) 10  MG tablet TAKE 1 TABLET EVERY DAY  30 tablet  5  . Multiple Vitamin (MULTIVITAMIN) tablet Take 1 tablet by mouth daily.        . naproxen sodium (ANAPROX) 220 MG tablet Take 220 mg by mouth 2 (two) times daily with a meal. Takes prn       . prednisoLONE acetate (PRED FORTE) 1 % ophthalmic suspension Place 1 drop into the right eye 2 (two) times daily.       Marland Kitchen ROBAXIN 500 MG tablet TAKE 1 TABLET EVERY 6 HOURS AS NEEDED FOR MUSCLE SPASM  50 tablet  1  . Timolol Maleate (ISTALOL) 0.5 % (DAILY) SOLN Apply to eye. 1 drop right  eye twice a day-and- 1 drop left eye once a day         Review of Systems:  As per HPI, otherwise negative.    Physical Examination: Filed Vitals:   10/29/12 1318  BP: 128/86  Pulse: 83  Temp: 98 F (36.7 C)  Resp: 17   Filed Vitals:   10/29/12 1318  Height: 5\' 3"  (1.6 m)  Weight: 140 lb (63.504 kg)   Body mass index is 24.80 kg/(m^2). Ideal Body Weight: Weight in (lb) to have BMI = 25: 140.8   GEN: WDWN, NAD, Non-toxic, A & O x 3 HEENT: Atraumatic, Normocephalic.  Neck supple. No masses, No LAD. Ears and Nose: No external deformity. CV: RRR, No M/G/R. No JVD. No thrill. No extra heart sounds. PULM: CTA B, no wheezes, crackles, rhonchi. No retractions. No resp. distress. No accessory muscle use. ABD: S, NT, ND, +BS. No rebound. No HSM. EXTR: No c/c/e NEURO Normal gait.  PSYCH: Normally interactive. Conversant. Not depressed or anxious appearing.  Calm demeanor.    Assessment and Plan: Cough and shortness of breat Hiatal hernia  Carmelina Dane, MD   [facility] reading (PRIMARY) by  Dr. Dareen Piano. Osteopenia.  Hiatal hernia.

## 2012-11-03 ENCOUNTER — Telehealth: Payer: Self-pay | Admitting: Cardiology

## 2012-11-03 DIAGNOSIS — R05 Cough: Secondary | ICD-10-CM

## 2012-11-03 DIAGNOSIS — J029 Acute pharyngitis, unspecified: Secondary | ICD-10-CM

## 2012-11-03 MED ORDER — AMOXICILLIN 500 MG PO CAPS: 500.0000 mg | ORAL_CAPSULE | Freq: Three times a day (TID) | ORAL | Status: DC

## 2012-11-03 NOTE — Telephone Encounter (Signed)
Pt has upper respiratory infections, same symptoms as last year when she had bronchitis , can get med?  pls call [phone] or [phone]

## 2012-11-03 NOTE — Telephone Encounter (Signed)
Advised patient and sent to pharmacy  

## 2012-11-03 NOTE — Telephone Encounter (Signed)
Patient requesting something besides Zpak, states it has not really helped her in past. Sore throat, congestion, sneezing, runny nose.  Will forward to  Dr. Patty Sermons for review

## 2012-11-03 NOTE — Telephone Encounter (Signed)
Will forward to  Dr. Brackbill for review 

## 2012-11-03 NOTE — Telephone Encounter (Signed)
Instead of Z-Pak we can use amoxicillin 500 mg 3 times a day #20

## 2012-11-03 NOTE — Telephone Encounter (Signed)
Z-Pak and Mucinex 

## 2012-11-17 ENCOUNTER — Other Ambulatory Visit: Payer: Self-pay

## 2012-11-17 MED ORDER — ROSUVASTATIN CALCIUM 10 MG PO TABS: 10.0000 mg | ORAL_TABLET | Freq: Every day | ORAL | Status: DC

## 2012-11-19 ENCOUNTER — Other Ambulatory Visit: Payer: Self-pay

## 2012-11-19 MED ORDER — ROSUVASTATIN CALCIUM 10 MG PO TABS: 10.0000 mg | ORAL_TABLET | Freq: Every day | ORAL | Status: DC

## 2012-12-01 ENCOUNTER — Telehealth: Payer: Self-pay | Admitting: Cardiology

## 2012-12-01 NOTE — Telephone Encounter (Signed)
New problem:   Would like to set up labs work on same day

## 2012-12-01 NOTE — Telephone Encounter (Signed)
Patient will just see  Dr. Patty Sermons and will order labs then in case he wants something else beside the usual lp, bmet, and hfp. Patient aware

## 2012-12-08 ENCOUNTER — Other Ambulatory Visit: Payer: Medicare Other

## 2012-12-08 ENCOUNTER — Ambulatory Visit: Payer: Medicare Other | Admitting: Cardiology

## 2012-12-15 ENCOUNTER — Other Ambulatory Visit: Payer: Self-pay | Admitting: *Deleted

## 2012-12-15 MED ORDER — LISINOPRIL 10 MG PO TABS: 10.0000 mg | ORAL_TABLET | Freq: Every day | ORAL | Status: DC

## 2012-12-23 ENCOUNTER — Ambulatory Visit (INDEPENDENT_AMBULATORY_CARE_PROVIDER_SITE_OTHER): Payer: Medicare Other | Admitting: Cardiology

## 2012-12-23 ENCOUNTER — Encounter: Payer: Self-pay | Admitting: Cardiology

## 2012-12-23 ENCOUNTER — Other Ambulatory Visit (INDEPENDENT_AMBULATORY_CARE_PROVIDER_SITE_OTHER): Payer: Medicare Other

## 2012-12-23 VITALS — BP 144/90 | HR 60 | Ht 63.0 in | Wt 140.0 lb

## 2012-12-23 DIAGNOSIS — M810 Age-related osteoporosis without current pathological fracture: Secondary | ICD-10-CM

## 2012-12-23 DIAGNOSIS — I340 Nonrheumatic mitral (valve) insufficiency: Secondary | ICD-10-CM

## 2012-12-23 DIAGNOSIS — Z862 Personal history of diseases of the blood and blood-forming organs and certain disorders involving the immune mechanism: Secondary | ICD-10-CM

## 2012-12-23 DIAGNOSIS — I493 Ventricular premature depolarization: Secondary | ICD-10-CM

## 2012-12-23 DIAGNOSIS — I059 Rheumatic mitral valve disease, unspecified: Secondary | ICD-10-CM

## 2012-12-23 DIAGNOSIS — E559 Vitamin D deficiency, unspecified: Secondary | ICD-10-CM | POA: Diagnosis not present

## 2012-12-23 DIAGNOSIS — E7800 Pure hypercholesterolemia, unspecified: Secondary | ICD-10-CM

## 2012-12-23 DIAGNOSIS — I119 Hypertensive heart disease without heart failure: Secondary | ICD-10-CM

## 2012-12-23 DIAGNOSIS — Z79899 Other long term (current) drug therapy: Secondary | ICD-10-CM

## 2012-12-23 LAB — BASIC METABOLIC PANEL
BUN: 21 mg/dL (ref 6–23)
CO2: 26 mEq/L (ref 19–32)
Chloride: 107 mEq/L (ref 96–112)
Creatinine, Ser: 1 mg/dL (ref 0.4–1.2)

## 2012-12-23 LAB — HEPATIC FUNCTION PANEL
ALT: 14 U/L (ref 0–35)
Total Bilirubin: 0.8 mg/dL (ref 0.3–1.2)
Total Protein: 6.8 g/dL (ref 6.0–8.3)

## 2012-12-23 LAB — LIPID PANEL
Cholesterol: 162 mg/dL (ref 0–200)
Triglycerides: 132 mg/dL (ref 0.0–149.0)

## 2012-12-23 NOTE — Progress Notes (Signed)
Tina Mercado Date of Birth:  [birth year] [facility] [address] [place], [place]  [address] [phone]         Fax   [phone]  History of Present Illness: This pleasant 79 year old, Caucasian female is seen for a four-month followup office visit. She has been widowed since June of 2010. The patient has a history of essential hypertension. She also has a history of mitral valve disease with atrial arrhythmias. She has a history of essential hypertension with diastolic dysfunction. She is now on lisinopril. She feels that the lisinopril has helped her palpitations and she has not been aware of any recent palpitations or extrasystoles. Previously she had had some problem with extrasystoles which resolved when she cut out drinking caffeinated coffee.  Since early January she has had a lingering head and chest cold.  She went to urgent care and had a chest x-ray which was okay.  She finally responded after a course of amoxicillin.   Current Outpatient Prescriptions  Medication Sig Dispense Refill  . aspirin EC 81 MG tablet Take 81 mg by mouth daily.        . cholecalciferol (VITAMIN D) 1000 UNITS tablet Take 2,000 Units by mouth daily.       [name] Calcium (STOOL SOFTENER PO) Take by mouth. Takes prn       . lisinopril (PRINIVIL,ZESTRIL) 10 MG tablet Take 1 tablet (10 mg total) by mouth daily.  30 tablet  5  . Multiple Vitamin (MULTIVITAMIN) tablet Take 1 tablet by mouth daily.        . naproxen sodium (ANAPROX) 220 MG tablet Take 220 mg by mouth 2 (two) times daily with a meal. Takes prn       . prednisoLONE acetate (PRED FORTE) 1 % ophthalmic suspension Place 1 drop into the right eye 2 (two) times daily.       Marland Kitchen ROBAXIN 500 MG tablet TAKE 1 TABLET EVERY 6 HOURS AS NEEDED FOR MUSCLE SPASM  50 tablet  1  . rosuvastatin (CRESTOR) 10 MG tablet Take 1 tablet (10 mg total) by mouth daily.  30 tablet  2  . Timolol Maleate (ISTALOL) 0.5 % (DAILY) SOLN Apply to eye.  1 drop right  eye twice a day-and- 1 drop left eye once a day        No current facility-administered medications for this visit.    Allergies  Allergen Reactions  . Celebrex (Celecoxib)     edema  . Darvocet (Propoxyphene-Acetaminophen)     nausea  . Demerol     nausea  . Feldene (Piroxicam)     edema  . Lipitor (Atorvastatin Calcium)   . Zocor (Simvastatin)     Patient Active Problem List  Diagnosis  . Mitral regurgitation  . History of anemia  . History of diastolic dysfunction  . Reflux  . History of seasonal allergies  . Benign hypertensive heart disease without heart failure  . Pure hypercholesterolemia  . Bronchitis  . Frequent unifocal PVCs    History  Smoking status  . Never Smoker   Smokeless tobacco  . Not on file    History  Alcohol Use No    Family History  Problem Relation Age of Onset  . Heart disease Mother   . Emphysema Father   . Emphysema Brother   . COPD Brother     Review of Systems: Constitutional: no fever chills diaphoresis or fatigue or change in weight.  Head and neck: no hearing  loss, no epistaxis, no photophobia or visual disturbance. Respiratory: No cough, shortness of breath or wheezing. Cardiovascular: No chest pain peripheral edema, palpitations. Gastrointestinal: No abdominal distention, no abdominal pain, no change in bowel habits hematochezia or melena. Genitourinary: No dysuria, no frequency, no urgency, no nocturia. Musculoskeletal:No arthralgias, no back pain, no gait disturbance or myalgias. Neurological: No dizziness, no headaches, no numbness, no seizures, no syncope, no weakness, no tremors. Hematologic: No lymphadenopathy, no easy bruising. Psychiatric: No confusion, no hallucinations, no sleep disturbance.    Physical Exam: Filed Vitals:   12/23/12 1130  BP: 144/90  Pulse: 60   the general appearance reveals a well-developed well-nourished woman in no distress.  Blood pressure here in the office is  higher than at home.The head and neck exam reveals pupils equal and reactive.  Extraocular movements are full.  There is no scleral icterus.  The mouth and pharynx are normal.  The neck is supple.  The carotids reveal no bruits.  The jugular venous pressure is normal.  The  thyroid is not enlarged.  There is no lymphadenopathy.  The chest is clear to percussion and auscultation.  There are no rales or rhonchi.  Expansion of the chest is symmetrical.  The precordium is quiet.  The first heart sound is normal.  The second heart sound is physiologically split.  There is no murmur gallop rub or click.  There is no abnormal lift or heave.  The abdomen is soft and nontender.  The bowel sounds are normal.  The liver and spleen are not enlarged.  There are no abdominal masses.  There are no abdominal bruits.  Extremities reveal good pedal pulses.  There is no phlebitis or edema.  There is no cyanosis or clubbing.  Strength is normal and symmetrical in all extremities.  There is no lateralizing weakness.  There are no sensory deficits.  The skin is warm and dry.  There is no rash.     Assessment / Plan: Continue same medication.  Continue Mucinex as needed for residual upper respiratory infection.  Recheck in 4 months for followup office visit lipid panel hepatic function panel and basal metabolic panel

## 2012-12-23 NOTE — Assessment & Plan Note (Signed)
Blood pressure has been remaining stable on current therapy.  No headaches dizziness or syncope.  No symptoms of CHF. 

## 2012-12-23 NOTE — Assessment & Plan Note (Signed)
The patient has a known apical heart murmur and echocardiogram has shown mild to moderate mitral regurgitation.  She has not had any symptoms of CHF or angina pectoris

## 2012-12-23 NOTE — Progress Notes (Signed)
Quick Note:  Please report to patient. The recent labs are stable. Continue same medication and careful diet. ______ 

## 2012-12-23 NOTE — Assessment & Plan Note (Signed)
The patient is on Crestor for her hypercholesterolemia.  We are checking lab work today.

## 2012-12-23 NOTE — Assessment & Plan Note (Signed)
The patient has a history of osteoporosis and a past history of low vitamin D levels.  She has been taking vitamin D and is concerned whether she is taking the proper dose.  We will check a vitamin D level today if possible.

## 2012-12-23 NOTE — Patient Instructions (Addendum)
Will obtain labs today and call you with the results  Your physician recommends that you continue on your current medications as directed. Please refer to the Current Medication list given to you today.  Your physician recommends that you schedule a follow-up appointment in: 4 months with fasting labs (lp/bmet/hfp)  

## 2012-12-24 LAB — VITAMIN D 25 HYDROXY (VIT D DEFICIENCY, FRACTURES): Vit D, 25-Hydroxy: 78 ng/mL (ref 30–89)

## 2013-01-21 DIAGNOSIS — H2510 Age-related nuclear cataract, unspecified eye: Secondary | ICD-10-CM | POA: Diagnosis not present

## 2013-01-21 DIAGNOSIS — T85398A Other mechanical complication of other ocular prosthetic devices, implants and grafts, initial encounter: Secondary | ICD-10-CM | POA: Diagnosis not present

## 2013-03-09 DIAGNOSIS — T85398A Other mechanical complication of other ocular prosthetic devices, implants and grafts, initial encounter: Secondary | ICD-10-CM | POA: Diagnosis not present

## 2013-03-09 DIAGNOSIS — H2510 Age-related nuclear cataract, unspecified eye: Secondary | ICD-10-CM | POA: Diagnosis not present

## 2013-04-07 DIAGNOSIS — H2510 Age-related nuclear cataract, unspecified eye: Secondary | ICD-10-CM | POA: Diagnosis not present

## 2013-04-07 DIAGNOSIS — Z961 Presence of intraocular lens: Secondary | ICD-10-CM | POA: Diagnosis not present

## 2013-04-07 DIAGNOSIS — H35359 Cystoid macular degeneration, unspecified eye: Secondary | ICD-10-CM | POA: Diagnosis not present

## 2013-04-13 ENCOUNTER — Encounter: Payer: Self-pay | Admitting: Cardiology

## 2013-04-13 ENCOUNTER — Other Ambulatory Visit: Payer: Medicare Other

## 2013-04-13 ENCOUNTER — Ambulatory Visit (INDEPENDENT_AMBULATORY_CARE_PROVIDER_SITE_OTHER): Payer: Medicare Other | Admitting: Cardiology

## 2013-04-13 VITALS — BP 132/80 | HR 68 | Ht 63.0 in | Wt 136.8 lb

## 2013-04-13 DIAGNOSIS — IMO0001 Reserved for inherently not codable concepts without codable children: Secondary | ICD-10-CM | POA: Diagnosis not present

## 2013-04-13 DIAGNOSIS — Z8679 Personal history of other diseases of the circulatory system: Secondary | ICD-10-CM

## 2013-04-13 DIAGNOSIS — M2550 Pain in unspecified joint: Secondary | ICD-10-CM | POA: Diagnosis not present

## 2013-04-13 DIAGNOSIS — E7800 Pure hypercholesterolemia, unspecified: Secondary | ICD-10-CM

## 2013-04-13 DIAGNOSIS — M810 Age-related osteoporosis without current pathological fracture: Secondary | ICD-10-CM | POA: Diagnosis not present

## 2013-04-13 DIAGNOSIS — M791 Myalgia, unspecified site: Secondary | ICD-10-CM

## 2013-04-13 DIAGNOSIS — H547 Unspecified visual loss: Secondary | ICD-10-CM

## 2013-04-13 DIAGNOSIS — H5410 Blindness, one eye, low vision other eye, unspecified eyes: Secondary | ICD-10-CM

## 2013-04-13 LAB — LIPID PANEL
Cholesterol: 169 mg/dL (ref 0–200)
Triglycerides: 171 mg/dL — ABNORMAL HIGH (ref 0.0–149.0)
VLDL: 34.2 mg/dL (ref 0.0–40.0)

## 2013-04-13 LAB — BASIC METABOLIC PANEL
BUN: 22 mg/dL (ref 6–23)
GFR: 40.21 mL/min — ABNORMAL LOW (ref >60.00)
Potassium: 4.6 mEq/L (ref 3.5–5.1)
Sodium: 137 mEq/L (ref 135–145)

## 2013-04-13 LAB — HEPATIC FUNCTION PANEL
ALT: 11 U/L (ref 0–35)
Albumin: 4.2 g/dL (ref 3.5–5.2)
Bilirubin, Direct: 0 mg/dL (ref 0.0–0.3)
Total Protein: 7.5 g/dL (ref 6.0–8.3)

## 2013-04-13 LAB — C-REACTIVE PROTEIN: CRP: <0.5 mg/dL (ref 0.5–20.0)

## 2013-04-13 NOTE — Patient Instructions (Addendum)
Your physician recommends that you return for lab work in: TODAY, CRP,SED RATE, BMET LP HFP  Your physician wants you to follow-up in: 4 MONTHS  You will receive a reminder letter in the mail two months in advance. If you don't receive a letter, please call our office to schedule the follow-up appointment.  Your physician recommends that you continue on your current medications as directed. Please refer to the Current Medication list given to you today.

## 2013-04-13 NOTE — Assessment & Plan Note (Signed)
The patient has a history of osteoporosis.  She has a lot of aching across her shoulders and up into her neck from time to time.  She is also having some sciatic discomfort in her low back radiating down to her legs.  In addition to her previously scheduled blood work we will add a sedimentation rate and a C. reactive protein.  She will use Tylenol as necessary for discomfort.

## 2013-04-13 NOTE — Assessment & Plan Note (Signed)
The patient has decreased eyesight.  She is unable to drive her car at night.  She does not drive on the inner [place] or drive [facility] greater than 45 miles an hour.  She has no family here in [place].  Her daughter and son-in-law live outside of [place] and visits her occasionally.

## 2013-04-13 NOTE — Progress Notes (Signed)
Tina Mercado Date of Birth:  02-11-1929 [facility] [address] [place], [place]  [address] [phone]         Fax   [phone]  History of Present Illness: This pleasant 79 year old, Caucasian female is seen for a four-month followup office visit. She has been widowed since June of 2010. The patient has a history of essential hypertension. She also has a history of mitral valve disease with atrial arrhythmias. She has a history of essential hypertension with diastolic dysfunction. She is now on lisinopril. She feels that the lisinopril has helped her palpitations and she has not been aware of any recent palpitations or extrasystoles. Previously she had had some problem with extrasystoles which resolved when she cut out drinking caffeinated coffee.    Current Outpatient Prescriptions  Medication Sig Dispense Refill  . aspirin EC 81 MG tablet Take 81 mg by mouth daily.        . cholecalciferol (VITAMIN D) 1000 UNITS tablet Take 2,000 Units by mouth daily.       [name] Calcium (STOOL SOFTENER PO) Take by mouth. Takes prn       . lisinopril (PRINIVIL,ZESTRIL) 10 MG tablet Take 1 tablet (10 mg total) by mouth daily.  30 tablet  5  . Multiple Vitamin (MULTIVITAMIN) tablet Take 1 tablet by mouth daily.        . naproxen sodium (ANAPROX) 220 MG tablet Take 220 mg by mouth 2 (two) times daily with a meal. Takes prn       . prednisoLONE acetate (PRED FORTE) 1 % ophthalmic suspension Place 1 drop into the right eye 3 (three) times daily.       Marland Kitchen ROBAXIN 500 MG tablet TAKE 1 TABLET EVERY 6 HOURS AS NEEDED FOR MUSCLE SPASM  50 tablet  1  . rosuvastatin (CRESTOR) 10 MG tablet Take 1 tablet (10 mg total) by mouth daily.  30 tablet  2  . timolol (BETIMOL) 0.5 % ophthalmic solution Place 1 drop into both eyes 2 (two) times daily.       No current facility-administered medications for this visit.    Allergies  Allergen Reactions  . Celebrex (Celecoxib)     edema  .  Darvocet (Propoxyphene-Acetaminophen)     nausea  . Demerol     nausea  . Feldene (Piroxicam)     edema  . Lipitor (Atorvastatin Calcium)   . Zocor (Simvastatin)     Patient Active Problem List   Diagnosis Date Noted  . Osteoporosis 12/23/2012  . 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 Bronchitis 10/30/2011  . Benign hypertensive heart disease without heart failure 08/05/2011  . Pure hypercholesterolemia 08/05/2011  . Mitral regurgitation   . History of anemia   . History of diastolic dysfunction   . Reflux   . History of seasonal allergies     History  Smoking status  . Never Smoker   Smokeless tobacco  . Not on file    History  Alcohol Use No    Family History  Problem Relation Age of Onset  . Heart disease Mother   . Emphysema Father   . Emphysema Brother   . COPD Brother     Review of Systems: Constitutional: no fever chills diaphoresis or fatigue or change in weight.  Head and neck: no hearing loss, no epistaxis, no photophobia or visual disturbance. Respiratory: No cough, shortness of breath or wheezing. Cardiovascular: No chest pain peripheral edema, palpitations. Gastrointestinal: No abdominal distention, no abdominal  pain, no change in bowel habits hematochezia or melena. Genitourinary: No dysuria, no frequency, no urgency, no nocturia. Musculoskeletal:No arthralgias, no back pain, no gait disturbance or myalgias. Neurological: No dizziness, no headaches, no numbness, no seizures, no syncope, no weakness, no tremors. Hematologic: No lymphadenopathy, no easy bruising. Psychiatric: No confusion, no hallucinations, no sleep disturbance.    Physical Exam: Filed Vitals:   04/13/13 1123  BP: 132/80  Pulse:    general appearance reveals a well-developed well-nourished woman who looks younger than her stated age.The head and neck exam reveals pupils equal and reactive.  Extraocular movements are full.  There is no scleral icterus.  The mouth and pharynx are  normal.  The neck is supple.  The carotids reveal no bruits.  The jugular venous pressure is normal.  The  thyroid is not enlarged.  There is no lymphadenopathy.  The chest is clear to percussion and auscultation.  There are no rales or rhonchi.  Expansion of the chest is symmetrical.  The precordium is quiet.  The first heart sound is normal.  The second heart sound is physiologically split.  There is no murmur gallop rub or click.  There is no abnormal lift or heave.  The abdomen is soft and nontender.  The bowel sounds are normal.  The liver and spleen are not enlarged.  There are no abdominal masses.  There are no abdominal bruits.  Extremities reveal good pedal pulses.  There is no phlebitis or edema.  There is no cyanosis or clubbing.  Strength is normal and symmetrical in all extremities.  There is no lateralizing weakness.  There are no sensory deficits.  The skin is warm and dry.  There is no rash.     Assessment / Plan: Continue same medication.  Await results of today's lab work including sedimentation rate and C-reactive protein.  Recheck in 4 months for office visit lipid panel hepatic function panel and basal metabolic panel

## 2013-04-13 NOTE — Assessment & Plan Note (Signed)
The patient has a history of hypercholesterolemia and is on Crestor 10 mg daily.  She is not having any myalgias from the Crestor.  Blood work today is pending

## 2013-04-13 NOTE — Progress Notes (Signed)
Quick Note:  Please report to patient. The recent labs are stable. Continue same medication and careful diet. The sed rate is only slightly high. The CRP is normal. No evidence of polymyalgia rheumatica. ______

## 2013-04-13 NOTE — Assessment & Plan Note (Signed)
The patient is not having any symptoms of congestive heart failure 

## 2013-04-20 DIAGNOSIS — L821 Other seborrheic keratosis: Secondary | ICD-10-CM | POA: Diagnosis not present

## 2013-04-20 DIAGNOSIS — D235 Other benign neoplasm of skin of trunk: Secondary | ICD-10-CM | POA: Diagnosis not present

## 2013-04-20 DIAGNOSIS — Z85828 Personal history of other malignant neoplasm of skin: Secondary | ICD-10-CM | POA: Diagnosis not present

## 2013-05-12 ENCOUNTER — Telehealth: Payer: Self-pay | Admitting: Cardiology

## 2013-05-12 ENCOUNTER — Other Ambulatory Visit: Payer: Medicare Other

## 2013-05-12 DIAGNOSIS — R202 Paresthesia of skin: Secondary | ICD-10-CM

## 2013-05-12 DIAGNOSIS — R5383 Other fatigue: Secondary | ICD-10-CM

## 2013-05-12 DIAGNOSIS — G609 Hereditary and idiopathic neuropathy, unspecified: Secondary | ICD-10-CM

## 2013-05-12 DIAGNOSIS — G629 Polyneuropathy, unspecified: Secondary | ICD-10-CM

## 2013-05-12 DIAGNOSIS — Z139 Encounter for screening, unspecified: Secondary | ICD-10-CM

## 2013-05-12 NOTE — Telephone Encounter (Signed)
New problem   Pt needs to speak to you-is all she disregarded

## 2013-05-12 NOTE — Telephone Encounter (Signed)
Patient having burning and tingling in her legs at times and has been getting worse. Tingling is starting to migrate to other places. Patient is also complaining of being tired all of the time. Patient wanting B 12 level and tried to get in ordered but her insurance will not cover. Patient is going to call insurance to look into and call back.

## 2013-05-14 ENCOUNTER — Ambulatory Visit (INDEPENDENT_AMBULATORY_CARE_PROVIDER_SITE_OTHER): Payer: Medicare Other | Admitting: Family Medicine

## 2013-05-14 VITALS — BP 152/70 | HR 79 | Temp 97.9°F | Resp 16 | Ht 63.0 in | Wt 137.0 lb

## 2013-05-14 DIAGNOSIS — G629 Polyneuropathy, unspecified: Secondary | ICD-10-CM

## 2013-05-14 DIAGNOSIS — Z79899 Other long term (current) drug therapy: Secondary | ICD-10-CM

## 2013-05-14 DIAGNOSIS — G609 Hereditary and idiopathic neuropathy, unspecified: Secondary | ICD-10-CM

## 2013-05-14 DIAGNOSIS — Z8679 Personal history of other diseases of the circulatory system: Secondary | ICD-10-CM

## 2013-05-14 DIAGNOSIS — Z862 Personal history of diseases of the blood and blood-forming organs and certain disorders involving the immune mechanism: Secondary | ICD-10-CM | POA: Diagnosis not present

## 2013-05-14 DIAGNOSIS — I119 Hypertensive heart disease without heart failure: Secondary | ICD-10-CM | POA: Diagnosis not present

## 2013-05-14 DIAGNOSIS — E7800 Pure hypercholesterolemia, unspecified: Secondary | ICD-10-CM | POA: Diagnosis not present

## 2013-05-14 DIAGNOSIS — R7309 Other abnormal glucose: Secondary | ICD-10-CM

## 2013-05-14 LAB — POCT SEDIMENTATION RATE: POCT SED RATE: 49 mm/hr — AB (ref 0–22)

## 2013-05-14 LAB — POCT CBC
Granulocyte percent: 73.9 %G (ref 37–80)
HCT, POC: 40.3 % (ref 37.7–47.9)
Hemoglobin: 12.5 g/dL (ref 12.2–16.2)
MPV: 9.5 fL (ref 0–99.8)
POC Granulocyte: 4.7 (ref 2–6.9)
POC LYMPH PERCENT: 17.8 %L (ref 10–50)
POC MID %: 8.3 %M (ref 0–12)
RDW, POC: 18 %

## 2013-05-14 LAB — POCT GLYCOSYLATED HEMOGLOBIN (HGB A1C): Hemoglobin A1C: 5.7

## 2013-05-14 NOTE — Patient Instructions (Addendum)
Peripheral Neuropathy Peripheral neuropathy is a common disorder of your nerves resulting from damage. CAUSES  This disorder may be caused by a disease of the nerves or illness. Many neuropathies have well known causes such as:  Diabetes. This is one of the most common causes.   Uremia.   AIDS.   Nutritional deficiencies.   Other causes include mechanical pressures. These may be from:   Compression.   Injury.   Contusions or bruises.   Fracture or dislocated bones.   Pressure involving the nerves close to the surface. Nerves such as the ulnar, or radial can be injured by prolonged use of crutches.  Other injuries may come from:  Tumor.   Hemorrhage or bleeding into a nerve.   Exposure to cold or radiation.   Certain medicines or toxic substances (rare).   Vascular or collagen disorders such as:   Atherosclerosis.   Systemic lupus erythematosus.   Scleroderma.   Sarcoidosis.   Rheumatoid arthritis.   Polyarteritis nodosa.   A large number of cases are of unknown cause.  SYMPTOMS  Common problems include:  Weakness.   Numbness.   Abnormal sensations (paresthesia) such as:   Burning.   Tickling.   Pricking.   Tingling.   Pain in the arms, hands, legs and/or feet.  TREATMENT  Therapy for this disorder differs depending on the cause. It may vary from medical treatment with medications or physical therapy among others.   For example, therapy for this disorder caused by diabetes involves control of the diabetes.   In cases where a tumor or ruptured disc is the cause, therapy may involve surgery. This would be to remove the tumor or to repair the ruptured disc.   In entrapment or compression neuropathy, treatment may consist of splinting or surgical decompression of the ulnar or median nerves. A common example of entrapment neuropathy is carpal tunnel syndrome. This has become more common because of the increasing use of computers.   Peroneal and  radial compression neuropathies may require avoidance of pressure.   Physical therapy and/or splints may be useful in preventing contractures. This is a condition in which shortened muscles around joints cause abnormal and sometimes painful positioning of the joints.  Document Released: 10/03/2002 Document Revised: 06/25/2011 Document Reviewed: 10/13/2005 [employer] Patient Information 2012 [employer]. 

## 2013-05-14 NOTE — Progress Notes (Signed)
Subjective:    Patient ID: Tina Mercado, female    DOB: 01-18-1929, 79 y.o.   MRN: [id number] Chief Complaint  Patient presents with  . labs    HPI  Tina Mercado is a delightful 79 yo woman who is here today to have some labs drawn that were ordered by her cardiologist Dr. Patty Sermons - he is the pt's only regular physician and she had called him c/o fatigued and neuropathy so he ordered a cbc, esr, and vit B12 which she would like to have done today.  She states that in the past sev wks, she has started experiencing burning shooting pain running up and down her arms and legs - this is completely new.  She has been trying ibuprofen for this with minimal relief.  Past Medical History  Diagnosis Date  . Hypertension   . Mitral regurgitation     mild tomoderate  . History of anemia   . History of diastolic dysfunction   . Arrhythmia   . Heart murmur   . Hyperlipidemia   . Reflux   . History of seasonal allergies   . Allergy   . Cataract   . Glaucoma   . Osteoporosis    Current Outpatient Prescriptions on File Prior to Visit  Medication Sig Dispense Refill  . aspirin EC 81 MG tablet Take 81 mg by mouth daily.        . cholecalciferol (VITAMIN D) 1000 UNITS tablet Take 2,000 Units by mouth daily.       [name] Calcium (STOOL SOFTENER PO) Take by mouth. Takes prn       . lisinopril (PRINIVIL,ZESTRIL) 10 MG tablet Take 1 tablet (10 mg total) by mouth daily.  30 tablet  5  . Multiple Vitamin (MULTIVITAMIN) tablet Take 1 tablet by mouth daily.        . naproxen sodium (ANAPROX) 220 MG tablet Take 220 mg by mouth 2 (two) times daily with a meal. Takes prn       . prednisoLONE acetate (PRED FORTE) 1 % ophthalmic suspension Place 1 drop into the right eye 3 (three) times daily.       Marland Kitchen ROBAXIN 500 MG tablet TAKE 1 TABLET EVERY 6 HOURS AS NEEDED FOR MUSCLE SPASM  50 tablet  1  . rosuvastatin (CRESTOR) 10 MG tablet Take 1 tablet (10 mg total) by mouth daily.  30 tablet  2  . timolol  (BETIMOL) 0.5 % ophthalmic solution Place 1 drop into both eyes 2 (two) times daily.       No current facility-administered medications on file prior to visit.   Allergies  Allergen Reactions  . Celebrex (Celecoxib)     edema  . Darvocet (Propoxyphene-Acetaminophen)     nausea  . Demerol     nausea  . Feldene (Piroxicam)     edema  . Lipitor (Atorvastatin Calcium)   . Zocor (Simvastatin)      Review of Systems  Constitutional: Positive for activity change and fatigue. Negative for fever, chills, appetite change and unexpected weight change.  Musculoskeletal: Positive for myalgias and arthralgias. Negative for gait problem.  Skin: Negative for rash.  Neurological: Negative for weakness and numbness.      BP 152/70  Pulse 79  Temp(Src) 97.9 F (36.6 C) (Oral)  Resp 16  Ht 5\' 3"  (1.6 m)  Wt 137 lb (62.143 kg)  BMI 24.27 kg/m2  SpO2 97% Objective:   Physical Exam  Constitutional: She is oriented to person, place, and  time. She appears well-developed and well-nourished. No distress.  HENT:  Head: Normocephalic and atraumatic.  Right Ear: External ear normal.  Eyes: Conjunctivae are normal. No scleral icterus.  Pulmonary/Chest: Effort normal.  Neurological: She is alert and oriented to person, place, and time.  Skin: Skin is warm and dry. She is not diaphoretic. No erythema.  Psychiatric: She has a normal mood and affect. Her behavior is normal.      Results for orders placed in visit on 05/14/13  POCT CBC      Result Value Range   WBC 6.3  4.6 - 10.2 K/uL   Lymph, poc 1.1  0.6 - 3.4   POC LYMPH PERCENT 17.8  10 - 50 %L   MID (cbc) 0.5  0 - 0.9   POC MID % 8.3  0 - 12 %M   POC Granulocyte 4.7  2 - 6.9   Granulocyte percent 73.9  37 - 80 %G   RBC 4.39  4.04 - 5.48 M/uL   Hemoglobin 12.5  12.2 - 16.2 g/dL   HCT, POC 96.0  45.4 - 47.9 %   MCV 91.9  80 - 97 fL   MCH, POC 28.5  27 - 31.2 pg   MCHC 31.0 (*) 31.8 - 35.4 g/dL   RDW, POC 09.8     Platelet Count, POC  271  142 - 424 K/uL   MPV 9.5  0 - 99.8 fL  POCT GLYCOSYLATED HEMOGLOBIN (HGB A1C)      Result Value Range   Hemoglobin A1C 5.7      Assessment & Plan:  Benign hypertensive heart disease without heart failure - Plan: CANCELED: Lipid panel, CANCELED: Hepatic function panel, CANCELED: Basic metabolic panel - BP mildly elev today - f/u w/ Dr. Patty Sermons.  History of anemia - Plan: POCT CBC, Vitamin B12 - not currently anemia and MCV nml so unlikely low b12.  Encounter for long-term (current) use of other medications - Plan: Comprehensive metabolic panel  Peripheral neuropathy - Plan: POCT glycosylated hemoglobin (Hb A1C), POCT SEDIMENTATION RATE, TSH, Vitamin B12 - advised that a RPR test may also be advisable but pt declines at this time.  Wonder if she could be having an atypical side effect from the crestor?  Consider neurology eval unless labs suggest etiology.  Abnormal glucose  - Plan: POCT glycosylated hemoglobin (Hb A1C) - borderline to pre-DM but not sig enough to be responsible for her new diffuse peripheral neuropathy.

## 2013-05-15 LAB — COMPREHENSIVE METABOLIC PANEL
ALT: 13 U/L (ref 0–35)
AST: 17 U/L (ref 0–37)
Alkaline Phosphatase: 58 U/L (ref 39–117)
Calcium: 9.5 mg/dL (ref 8.4–10.5)
Chloride: 105 mEq/L (ref 96–112)
Creat: 1.13 mg/dL — ABNORMAL HIGH (ref 0.50–1.10)

## 2013-05-16 ENCOUNTER — Encounter: Payer: Self-pay | Admitting: Family Medicine

## 2013-05-17 ENCOUNTER — Telehealth: Payer: Self-pay | Admitting: Cardiology

## 2013-05-17 ENCOUNTER — Telehealth: Payer: Self-pay

## 2013-05-17 NOTE — Telephone Encounter (Signed)
Could be lisinopril side effect. Stop it now.

## 2013-05-17 NOTE — Telephone Encounter (Signed)
Advised patient, will call back with update  

## 2013-05-17 NOTE — Telephone Encounter (Signed)
New prob  Pt forgot to ask you something when you all were on the phone

## 2013-05-17 NOTE — Telephone Encounter (Signed)
Patient having burning and tingling in her legs at times and has been getting worse. Tingling is starting to migrate to other places. Patient is also complaining of being tired all of the time. Patient states tingling is even going into her tongue. Did read side effects of Lisinopril and would like to stop it, does not want to continue. Will forward to  Dr. Patty Sermons for review

## 2013-05-17 NOTE — Telephone Encounter (Signed)
Spoke with patient and she actually went to Urgent Care over the weekend regarding issue. She will call them for her lab results

## 2013-05-17 NOTE — Telephone Encounter (Signed)
Pt would like to know if her labs are in yet. Best# [phone]

## 2013-05-20 ENCOUNTER — Telehealth: Payer: Self-pay | Admitting: Cardiology

## 2013-05-20 ENCOUNTER — Telehealth: Payer: Self-pay | Admitting: Family Medicine

## 2013-05-20 NOTE — Telephone Encounter (Signed)
Spoke with patient regarding recent stopping of a medication. Continues to have tingling and requested neurology consult. Advised to call Dr Clelia Croft for lab results as well as referral to neurologist since evaluated for this. If she will not do

## 2013-05-20 NOTE — Telephone Encounter (Signed)
New Prob     Pt would like to speak to nurse regarding some lab work and a possible allergy. Please call.

## 2013-05-20 NOTE — Telephone Encounter (Signed)
Patient called about lab results i gave her your message that was in letter\notes

## 2013-05-23 DIAGNOSIS — Z947 Corneal transplant status: Secondary | ICD-10-CM | POA: Diagnosis not present

## 2013-05-23 DIAGNOSIS — H2510 Age-related nuclear cataract, unspecified eye: Secondary | ICD-10-CM | POA: Diagnosis not present

## 2013-06-01 ENCOUNTER — Other Ambulatory Visit: Payer: Self-pay | Admitting: Cardiology

## 2013-06-02 NOTE — Telephone Encounter (Signed)
Fax Received. Refill Completed. Tina Mercado (R.M.A)   

## 2013-06-10 ENCOUNTER — Encounter: Payer: Self-pay | Admitting: Family Medicine

## 2013-06-10 ENCOUNTER — Ambulatory Visit (INDEPENDENT_AMBULATORY_CARE_PROVIDER_SITE_OTHER): Payer: Medicare Other | Admitting: Family Medicine

## 2013-06-10 VITALS — BP 162/82 | HR 74 | Temp 98.0°F | Resp 16 | Ht 62.5 in | Wt 138.0 lb

## 2013-06-10 DIAGNOSIS — R202 Paresthesia of skin: Secondary | ICD-10-CM

## 2013-06-10 DIAGNOSIS — Z7289 Other problems related to lifestyle: Secondary | ICD-10-CM

## 2013-06-10 DIAGNOSIS — R209 Unspecified disturbances of skin sensation: Secondary | ICD-10-CM | POA: Diagnosis not present

## 2013-06-10 DIAGNOSIS — Z1159 Encounter for screening for other viral diseases: Secondary | ICD-10-CM | POA: Diagnosis not present

## 2013-06-10 DIAGNOSIS — IMO0001 Reserved for inherently not codable concepts without codable children: Secondary | ICD-10-CM

## 2013-06-10 LAB — RHEUMATOID FACTOR: Rhuematoid fact SerPl-aCnc: 25 IU/mL — ABNORMAL HIGH (ref <=14)

## 2013-06-10 NOTE — Progress Notes (Signed)
Subjective:    Patient ID: Tina Mercado, female    DOB: [birth year], 79 y.o.   MRN: [id number] Chief Complaint  Patient presents with  . Follow-up    labs result  . Referral    possible to neurologist    HPI  H/o severe left sciatic pain and chronic mild right sided sciatica so started taking ibuprofen daily and sciatic pain finely resolved after months.  Tingling, burning, stinging sensations in legs for over a year - started in anterior lower shin - like sunburn and gradually worsened in feet and spread up legs, then started in arms and now around lower face in neck and flushed feeling in face, moderate in arms but occasionally bad in arms.  Waxes and wanes - every few days and sometimes severe.  For about 40 years she has had upper back pain. It is more when she is leaning forward or carrying something but at times completely incapacitating. Helps to lay flat on back and rest. Tylenol, nsaids don't help.  Dr. Patty Sermons thinks due to osteoporosis.   Mother had similar sxs.  As a teenager developed severe pain around scapula - almost like a labor pain - crushing that increases in intensity till she wanted to scream, then subsided. Then moved to other side of her back and then would not go away. Moving made it worse.  Hasn't had an episode in several years, more frequent when she was pregnant.   Occasional gets hot and sweats and arms get shakey, resolves with eating.  Past Medical History  Diagnosis Date  . Hypertension   . Mitral regurgitation     mild tomoderate  . History of anemia   . History of diastolic dysfunction   . Arrhythmia   . Heart murmur   . Hyperlipidemia   . Reflux   . History of seasonal allergies   . Allergy   . Cataract   . Glaucoma   . Osteoporosis    Current Outpatient Prescriptions on File Prior to Visit  Medication Sig Dispense Refill  . aspirin EC 81 MG tablet Take 81 mg by mouth daily.        . cholecalciferol (VITAMIN D) 1000 UNITS tablet Take  2,000 Units by mouth daily.       . CRESTOR 10 MG tablet TAKE 1 TABLET (10 MG TOTAL) BY MOUTH DAILY.  30 tablet  5  . Docusate Calcium (STOOL SOFTENER PO) Take by mouth. Takes prn       . ibuprofen (ADVIL,MOTRIN) 200 MG tablet Take 200 mg by mouth as needed for pain (for arthritis pain).      . Multiple Vitamin (MULTIVITAMIN) tablet Take 1 tablet by mouth daily.        . naproxen sodium (ANAPROX) 220 MG tablet Take 220 mg by mouth 2 (two) times daily with a meal. Takes prn       . prednisoLONE acetate (PRED FORTE) 1 % ophthalmic suspension Place 1 drop into the right eye 3 (three) times daily.       Marland Kitchen ROBAXIN 500 MG tablet TAKE 1 TABLET EVERY 6 HOURS AS NEEDED FOR MUSCLE SPASM  50 tablet  1  . timolol (BETIMOL) 0.5 % ophthalmic solution Place 1 drop into both eyes 2 (two) times daily.       No current facility-administered medications on file prior to visit.   Allergies  Allergen Reactions  . Celebrex [Celecoxib]     edema  . Darvocet [Propoxyphene-Acetaminophen]     nausea  .  Demerol     nausea  . Feldene [Piroxicam]     edema  . Lipitor [Atorvastatin Calcium]   . Zocor [Simvastatin]      Review of Systems  Constitutional: Positive for diaphoresis and fatigue. Negative for fever, chills, activity change, appetite change and unexpected weight change.  Eyes: Positive for visual disturbance.  Respiratory: Negative for cough and shortness of breath.   Cardiovascular: Negative for chest pain, palpitations and leg swelling.  Endocrine: Positive for heat intolerance.  Genitourinary: Negative for decreased urine volume.  Musculoskeletal: Positive for myalgias, back pain and arthralgias. Negative for gait problem.  Skin: Negative for rash.  Neurological: Positive for tremors and numbness. Negative for syncope, weakness and headaches.  Hematological: Does not bruise/bleed easily.      BP 162/82  Pulse 74  Temp(Src) 98 F (36.7 C) (Oral)  Resp 16  Ht 5' 2.5" (1.588 m)  Wt 138 lb  (62.596 kg)  BMI 24.82 kg/m2  SpO2 99% Objective:   Physical Exam  Constitutional: She is oriented to person, place, and time. She appears well-developed and well-nourished. No distress.  HENT:  Head: Normocephalic and atraumatic.  Right Ear: External ear normal.  Left Ear: External ear normal.  Eyes: Conjunctivae are normal. No scleral icterus.  Permanent Rt eyelid ptosis from prior eye surgeries.  Neck: Normal range of motion. Neck supple. No thyromegaly present.  Cardiovascular: Normal rate, regular rhythm, normal heart sounds and intact distal pulses.   Pulmonary/Chest: Effort normal and breath sounds normal. No respiratory distress.  Musculoskeletal: She exhibits no edema.  Lymphadenopathy:    She has no cervical adenopathy.  Neurological: She is alert and oriented to person, place, and time.  Skin: Skin is warm and dry. She is not diaphoretic. No erythema.  Psychiatric: She has a normal mood and affect. Her behavior is normal.       Assessment & Plan:  Myalgia and myositis - Plan: CK, Sedimentation Rate, C-reactive protein, RPR, Rheumatoid factor, ANA, CANCELED: ANA w/Reflex if Positive  Tingling sensation - Plan: CK, Sedimentation Rate, C-reactive protein, RPR, Rheumatoid factor, CANCELED: ANA w/Reflex if Positive - no improvement after stopping lisinopril so ok to restart. Will f/u BP w/ Dr. Patty Sermons.  Consider referral to neurology and/or rheumatology after additional labs return.   Meds ordered this encounter  Medications  . lisinopril (PRINIVIL,ZESTRIL) 10 MG tablet    Sig:   . bromfenac (XIBROM) 0.09 % ophthalmic solution    Sig:

## 2013-06-10 NOTE — Patient Instructions (Signed)
Restart your lisinopril.

## 2013-06-11 LAB — RPR

## 2013-06-13 ENCOUNTER — Encounter: Payer: Self-pay | Admitting: Family Medicine

## 2013-06-13 ENCOUNTER — Encounter: Payer: Self-pay | Admitting: Cardiology

## 2013-06-13 LAB — ANA: Anti Nuclear Antibody(ANA): NEGATIVE

## 2013-06-20 ENCOUNTER — Telehealth: Payer: Self-pay | Admitting: *Deleted

## 2013-06-20 ENCOUNTER — Other Ambulatory Visit: Payer: Self-pay | Admitting: Cardiology

## 2013-06-20 NOTE — Telephone Encounter (Signed)
Those BPs are good for her age.  CSD

## 2013-06-20 NOTE — Telephone Encounter (Signed)
Patient restarted Lisinopril and blood pressure 130's-140's/60's-70's after restarting the Lisinopril 10 mg daily. Will forward to  Dr. Patty Sermons for review

## 2013-06-21 DIAGNOSIS — L820 Inflamed seborrheic keratosis: Secondary | ICD-10-CM | POA: Diagnosis not present

## 2013-06-28 NOTE — Telephone Encounter (Signed)
systolics are too high.  Increase lisinopril to 10 mg BID.

## 2013-06-28 NOTE — Telephone Encounter (Signed)
After calling patient back she stated that her blood pressure has been running up in the 150's and 160's quite often at different times. Lowest systolic was 104 one reading with a few being 110. Will forward to  Dr. Patty Sermons for review

## 2013-06-29 ENCOUNTER — Other Ambulatory Visit: Payer: Self-pay | Admitting: *Deleted

## 2013-06-29 DIAGNOSIS — I119 Hypertensive heart disease without heart failure: Secondary | ICD-10-CM

## 2013-06-29 DIAGNOSIS — Z947 Corneal transplant status: Secondary | ICD-10-CM | POA: Diagnosis not present

## 2013-06-29 DIAGNOSIS — H2510 Age-related nuclear cataract, unspecified eye: Secondary | ICD-10-CM | POA: Diagnosis not present

## 2013-06-29 MED ORDER — LISINOPRIL 10 MG PO TABS: 10.0000 mg | ORAL_TABLET | Freq: Two times a day (BID) | ORAL | Status: DC

## 2013-06-29 NOTE — Telephone Encounter (Signed)
Left message to call back  

## 2013-06-29 NOTE — Telephone Encounter (Signed)
Advised patient, will call back with updated blood pressure

## 2013-07-04 ENCOUNTER — Encounter: Payer: Self-pay | Admitting: Neurology

## 2013-07-04 ENCOUNTER — Ambulatory Visit (INDEPENDENT_AMBULATORY_CARE_PROVIDER_SITE_OTHER): Payer: Medicare Other | Admitting: Neurology

## 2013-07-04 VITALS — BP 157/70 | HR 78 | Temp 98.4°F | Ht 62.5 in | Wt 139.0 lb

## 2013-07-04 DIAGNOSIS — R209 Unspecified disturbances of skin sensation: Secondary | ICD-10-CM | POA: Diagnosis not present

## 2013-07-04 DIAGNOSIS — D649 Anemia, unspecified: Secondary | ICD-10-CM | POA: Diagnosis not present

## 2013-07-04 DIAGNOSIS — R202 Paresthesia of skin: Secondary | ICD-10-CM

## 2013-07-04 DIAGNOSIS — G609 Hereditary and idiopathic neuropathy, unspecified: Secondary | ICD-10-CM | POA: Diagnosis not present

## 2013-07-04 DIAGNOSIS — G2581 Restless legs syndrome: Secondary | ICD-10-CM

## 2013-07-04 NOTE — Patient Instructions (Addendum)
I think overall you are doing fairly well but I do want to suggest a few things today:  Remember to drink plenty of fluid, eat healthy meals and do not skip any meals. Try to eat protein with a every meal and eat a healthy snack such as fruit or nuts in between meals. Try to keep a regular sleep-wake schedule and try to exercise daily, particularly in the form of walking, 20-30 minutes a day, if you can.   Engage in social activities in your community and with your family and try to keep up with current events by reading the newspaper or watching the news.   As far as your medications are concerned, I would like to suggest no new medication, but may consider RLS meds down the road.    As far as diagnostic testing: blood test today. We will hold off on electrophysiological testing of your nerves and muscle.   I would like to see you back in 6 months, sooner if we need to. Please call us with any interim questions, concerns, problems, updates or refill requests.  Please also call us for any test results so we can go over those with you on the phone. Brett Canales is my clinical assistant and will answer any of your questions and relay your messages to me and also relay most of my messages to you.  Our phone number is [phone]. We also have an after hours call service for urgent matters and there is a physician on-call for urgent questions. For any emergencies you know to call 911 or go to the nearest emergency room.

## 2013-07-04 NOTE — Progress Notes (Signed)
Subjective:    Patient ID: Tina Mercado is a 79 y.o. female.  HPI   Huston Foley, MD, PhD [facility] [address], [address] [address] [place], [place] [address]  Dear Dr. Clelia Croft,   I saw your patient, Tina Mercado, upon your kind request in my neurologic clinic today for initial consultation of her pain and tingling. The patient is unaccompanied today. As you know, Ms. Telleria is a very pleasant 79 year old right-handed woman with an underlying medical history of hypertension, anemia, hyperlipidemia, reflux disease, seasonal allergies, cataract, glaucoma, osteoporosis and mitral regurgitation with history of diastolic dysfunction and cardiac arrhythmia, who has been experiencing left-sided sciatica pain and right-sided sciatica symptoms, which are actually improved, but she also reports a gradual onset of a burning sensation in her feet, progressive to involve her distal legs. This has been ongoing for months, perhaps a year and in the past couple of months, she has had a similar burning sensation in her hands. She denies smoking or drinking alcohol and is not diabetic. She denies weakness or numbness. She has a long-standing history of upper back pain. Intermittently, for years, she has had a wave-like pain like labor pain in her abdomen and she has had a band like pain in her mid back. She denies radiating upper or lower back pain. Her burning Sx are worse at night and she does endorse some RLS Sx and walking around helps as well as taking tylenol. She recently had blood work including CK, ESR, CRP, RPR, rheumatoid factor, ANA, which I reviewed: ESR was 35 and RF was 25.   Her Past Medical History Is Significant For: Past Medical History  Diagnosis Date  . Hypertension   . Mitral regurgitation     mild tomoderate  . History of anemia   . History of diastolic dysfunction   . Arrhythmia   . Heart murmur   . Hyperlipidemia   . Reflux   . History of seasonal  allergies   . Allergy   . Cataract   . Glaucoma   . Osteoporosis     Her Past Surgical History Is Significant For: Past Surgical History  Procedure Laterality Date  . Corneal transplant  july 2007  . Tonsillectomy and adenoidectomy      age 27yrs  . Appendectomy    . Childbirth      x 1  . Breast surgery    . Eye surgery      Her Family History Is Significant For: Family History  Problem Relation Age of Onset  . Heart disease Mother   . Emphysema Father   . Emphysema Brother   . COPD Brother     Her Social History Is Significant For: History   Social History  . Marital Status: Single    Spouse Name: N/A    Number of Children: N/A  . Years of Education: N/A   Social History Main Topics  . Smoking status: Never Smoker   . Smokeless tobacco: None  . Alcohol Use: No  . Drug Use: No  . Sexual Activity: No   Other Topics Concern  . None   Social History Narrative  . None    Her Allergies Are:  Allergies  Allergen Reactions  . Celebrex [Celecoxib]     edema  . Darvocet [Propoxyphene-Acetaminophen]     nausea  . Demerol     nausea  . Feldene [Piroxicam]     edema  . Lipitor [Atorvastatin Calcium]   .  Zocor [Simvastatin]   :   Her Current Medications Are:  Outpatient Encounter Prescriptions as of 07/04/2013  Medication Sig Dispense Refill  . aspirin EC 81 MG tablet Take 81 mg by mouth daily.        . bromfenac (XIBROM) 0.09 % ophthalmic solution       . cholecalciferol (VITAMIN D) 1000 UNITS tablet Take 2,000 Units by mouth daily.       . CRESTOR 10 MG tablet TAKE 1 TABLET (10 MG TOTAL) BY MOUTH DAILY.  30 tablet  5  . Docusate Calcium (STOOL SOFTENER PO) Take by mouth. Takes prn       . ibuprofen (ADVIL,MOTRIN) 200 MG tablet Take 200 mg by mouth as needed for pain (for arthritis pain).      Marland Kitchen lisinopril (PRINIVIL,ZESTRIL) 10 MG tablet Take 1 tablet (10 mg total) by mouth 2 (two) times daily.  180 tablet  3  . Multiple Vitamin (MULTIVITAMIN) tablet  Take 1 tablet by mouth daily.        . naproxen sodium (ANAPROX) 220 MG tablet Take 220 mg by mouth 2 (two) times daily with a meal. Takes prn       . prednisoLONE acetate (PRED FORTE) 1 % ophthalmic suspension Place 1 drop into the right eye 4 (four) times daily.       Marland Kitchen ROBAXIN 500 MG tablet TAKE 1 TABLET EVERY 6 HOURS AS NEEDED FOR MUSCLE SPASM  50 tablet  1  . timolol (BETIMOL) 0.5 % ophthalmic solution Place 1 drop into both eyes 2 (two) times daily.       No facility-administered encounter medications on file as of 07/04/2013.  : Review of Systems  Eyes: Positive for visual disturbance (blurred).  Respiratory: Positive for cough.   Cardiovascular:       Murmur  Allergic/Immunologic: Positive for environmental allergies.  Neurological:       Restless leg    Objective:  Neurologic Exam  Physical Exam Physical Examination:   Filed Vitals:   07/04/13 1020  BP: 157/70  Pulse: 78  Temp: 98.4 F (36.9 C)    General Examination: The patient is a very pleasant 79 y.o. female in no acute distress. She appears well-developed and well-nourished and well groomed.   HEENT: Normocephalic, atraumatic, pupils are equal, round and reactive to light and accommodation. Funduscopic exam is normal with sharp disc margins noted on the L and hazy cornea is noted on the R with difficulty with visualizing eye background. Extraocular tracking is good without limitation to gaze excursion or nystagmus noted. Normal smooth pursuit is noted. Hearing is grossly intact. Face is symmetric with normal facial animation and normal facial sensation. Speech is clear with no dysarthria noted. There is no hypophonia. There is no lip, neck/head, jaw or voice tremor. Neck is supple with full range of passive and active motion. There are no carotid bruits on auscultation. Oropharynx exam reveals: mild mouth dryness, adequate dental hygiene and no airway crowding. Mallampati is class I. Tongue protrudes centrally and  palate elevates symmetrically.   Chest: Clear to auscultation without wheezing, rhonchi or crackles noted.  Heart: S1+S2+0, regular and normal without murmurs, rubs or gallops noted.   Abdomen: Soft, non-tender and non-distended with normal bowel sounds appreciated on auscultation.  Extremities: There is trace pitting edema in the distal lower extremities in the ankles bilaterally. Pedal pulses are intact.  Skin: Warm and dry without trophic changes noted. There are no varicose veins.  Musculoskeletal: exam reveals no obvious  joint deformities, tenderness or joint swelling or erythema.   Neurologically:  Mental status: The patient is awake, alert and oriented in all 4 spheres. Her memory, attention, language and knowledge are appropriate. There is no aphasia, agnosia, apraxia or anomia. Speech is clear with normal prosody and enunciation. Thought process is linear. Mood is congruent and affect is normal.  Cranial nerves are as described above under HEENT exam. In addition, shoulder shrug is normal with equal shoulder height noted. Motor exam: Normal bulk, strength and tone is noted. There is no drift, tremor or rebound. Romberg is negative. Reflexes are 2+ throughout. Toes are downgoing bilaterally. Fine motor skills are intact with normal finger taps, normal hand movements, normal rapid alternating patting, normal foot taps and normal foot agility.  Cerebellar testing shows no dysmetria or intention tremor on finger to nose testing. Heel to shin is unremarkable bilaterally. There is no truncal or gait ataxia.  Sensory exam is intact to light touch, pinprick, vibration, temperature sense and proprioception in the upper and lower extremities.  Gait, station and balance are unremarkable. No veering to one side is noted. No leaning to one side is noted. Posture is age-appropriate and stance is narrow based. No problems turning are noted. She turns en bloc. Tandem walk is unremarkable. Intact toe and  heel stance is noted.               Assessment and Plan:   Assessment and Plan:  In summary, AHONESTY WOODFIN is a very pleasant 79 y.o.-year old female with a history of paresthesias, and burning sensation in all 4 extremites.. Her physical exam is non-focal for the most part and is not showing any significant numbness or weakness. She currently does not report much in the way of paresthesias. She may have neuropathy of unclear etiology. She has had significant blood work done which I reviewed but I would like to add a few things today. I also feel she has signs and symptoms of restless leg syndrome and I would also like to add iron studies today. She does have a history of anemia. I do not suggest that we proceed with electrophysiological testing of her nerves and muscles just yet. She is agreeable. I do not think we need to start her on any medication at this time. Down the Road we may try her on Neurontin or even medication such as a low-dose dopamine agonist for her restless leg syndrome. At this juncture, we will call her back with her blood test results and I would like to reevaluate her in 6 months from now, sooner if the need arises. I reassured her that her exam was fairly normal.   I answered all her questions today and the patient was in agreement with the above outlined plan. I encouraged her to call with any interim questions, concerns, problems or updates and test results.   Thank you very much for allowing me to participate in the care of this nice patient. If I can be of any further assistance to you please do not hesitate to call me at [phone].  Sincerely,   Huston Foley, MD, PhD

## 2013-07-06 LAB — IFE AND PE, SERUM
Alpha 1: 0.2 g/dL (ref 0.1–0.4)
Gamma Glob SerPl Elph-Mcnc: 0.5 g/dL (ref 0.5–1.6)
Globulin, Total: 2.7 g/dL (ref 2.0–4.5)
IgA/Immunoglobulin A, Serum: 221 mg/dL (ref 91–414)
IgG (Immunoglobin G), Serum: 639 mg/dL — ABNORMAL LOW (ref 700–1600)

## 2013-07-06 LAB — IRON AND TIBC
Iron Saturation: 19 % (ref 15–55)
TIBC: 408 ug/dL (ref 250–450)
UIBC: 330 ug/dL (ref 150–375)

## 2013-07-06 LAB — HEAVY METALS PROFILE II, BLOOD: Lead, Blood: NOT DETECTED ug/dL (ref 0–19)

## 2013-07-07 NOTE — Progress Notes (Signed)
Quick Note:  Spoke to patient and relayed normal labs, per Dr. Frances Furbish. ______

## 2013-07-07 NOTE — Progress Notes (Signed)
Quick Note:  Please notify patient that her blood test results were within normal limits. We checked heavy-metals in her blood, iron, copper level, iron storage and protein electrophoresis. Huston Foley, MD, PhD [facility] ([facility]  ______

## 2013-08-12 DIAGNOSIS — Z23 Encounter for immunization: Secondary | ICD-10-CM | POA: Diagnosis not present

## 2013-08-30 ENCOUNTER — Ambulatory Visit: Payer: Medicare Other | Admitting: Cardiology

## 2013-08-30 ENCOUNTER — Other Ambulatory Visit: Payer: Medicare Other

## 2013-09-01 ENCOUNTER — Other Ambulatory Visit: Payer: Self-pay

## 2013-09-06 DIAGNOSIS — Z0000 Encounter for general adult medical examination without abnormal findings: Secondary | ICD-10-CM | POA: Diagnosis not present

## 2013-09-23 ENCOUNTER — Encounter: Payer: Self-pay | Admitting: Cardiology

## 2013-09-23 ENCOUNTER — Ambulatory Visit (INDEPENDENT_AMBULATORY_CARE_PROVIDER_SITE_OTHER): Payer: Medicare Other | Admitting: Cardiology

## 2013-09-23 ENCOUNTER — Other Ambulatory Visit: Payer: Medicare Other

## 2013-09-23 VITALS — BP 124/78 | HR 69 | Ht 62.5 in | Wt 142.0 lb

## 2013-09-23 DIAGNOSIS — E7800 Pure hypercholesterolemia, unspecified: Secondary | ICD-10-CM | POA: Diagnosis not present

## 2013-09-23 DIAGNOSIS — I119 Hypertensive heart disease without heart failure: Secondary | ICD-10-CM

## 2013-09-23 DIAGNOSIS — Z8679 Personal history of other diseases of the circulatory system: Secondary | ICD-10-CM

## 2013-09-23 DIAGNOSIS — I340 Nonrheumatic mitral (valve) insufficiency: Secondary | ICD-10-CM

## 2013-09-23 DIAGNOSIS — I059 Rheumatic mitral valve disease, unspecified: Secondary | ICD-10-CM | POA: Diagnosis not present

## 2013-09-23 DIAGNOSIS — I493 Ventricular premature depolarization: Secondary | ICD-10-CM

## 2013-09-23 DIAGNOSIS — I4949 Other premature depolarization: Secondary | ICD-10-CM

## 2013-09-23 LAB — BASIC METABOLIC PANEL
BUN: 18 mg/dL (ref 6–23)
Chloride: 106 mEq/L (ref 96–112)
Potassium: 4.7 mEq/L (ref 3.5–5.1)

## 2013-09-23 LAB — LIPID PANEL
Cholesterol: 170 mg/dL (ref 0–200)
LDL Cholesterol: 92 mg/dL (ref 0–99)
VLDL: 26.8 mg/dL (ref 0.0–40.0)

## 2013-09-23 LAB — HEPATIC FUNCTION PANEL
Bilirubin, Direct: 0.1 mg/dL (ref 0.0–0.3)
Total Bilirubin: 0.7 mg/dL (ref 0.3–1.2)
Total Protein: 6.7 g/dL (ref 6.0–8.3)

## 2013-09-23 MED ORDER — LOSARTAN POTASSIUM 25 MG PO TABS: 25.0000 mg | ORAL_TABLET | Freq: Every day | ORAL | Status: DC

## 2013-09-23 NOTE — Assessment & Plan Note (Signed)
The patient has not been experiencing any symptoms from her mitral regurgitation.  She has not had any atrial fibrillation.  She is not having any symptoms of CHF.

## 2013-09-23 NOTE — Progress Notes (Signed)
Tina Mercado Date of Birth:  October 16, 1929 [address] [facility] [address] [place], [place]  [address] [phone]         Fax   [phone]  History of Present Illness: This pleasant 79 year old, Caucasian female is seen for a four-month followup office visit. She has been widowed since June of 2010. The patient has a history of essential hypertension. She also has a history of mitral valve disease with atrial arrhythmias. She has a history of essential hypertension with diastolic dysfunction. She is now on lisinopril.  She does not feel that the lisinopril his controlling her blood pressure adequately.  She try doubling up on it and it has not seemed to make much difference.. Previously she had had some problem with extrasystoles which resolved when she cut out drinking caffeinated coffee.    Current Outpatient Prescriptions  Medication Sig Dispense Refill  . aspirin EC 81 MG tablet Take 81 mg by mouth daily.        . bromfenac (XIBROM) 0.09 % ophthalmic solution       . cholecalciferol (VITAMIN D) 1000 UNITS tablet Take 2,000 Units by mouth daily.       . CRESTOR 10 MG tablet TAKE 1 TABLET (10 MG TOTAL) BY MOUTH DAILY.  30 tablet  5  . Docusate Calcium (STOOL SOFTENER PO) Take by mouth. Takes prn       . Multiple Vitamin (MULTIVITAMIN) tablet Take 1 tablet by mouth daily.        . naproxen sodium (ANAPROX) 220 MG tablet Take 220 mg by mouth 2 (two) times daily with a meal. Takes prn       . prednisoLONE acetate (PRED FORTE) 1 % ophthalmic suspension Place 1 drop into the right eye 4 (four) times daily.       Marland Kitchen ROBAXIN 500 MG tablet TAKE 1 TABLET EVERY 6 HOURS AS NEEDED FOR MUSCLE SPASM  50 tablet  1  . timolol (BETIMOL) 0.5 % ophthalmic solution Place 1 drop into both eyes 2 (two) times daily.      Marland Kitchen ibuprofen (ADVIL,MOTRIN) 200 MG tablet Take 200 mg by mouth as needed for pain (for arthritis pain).      Marland Kitchen losartan (COZAAR) 25 MG tablet Take 1 tablet (25 mg total) by mouth daily.  30  tablet  5   No current facility-administered medications for this visit.    Allergies  Allergen Reactions  . Celebrex [Celecoxib]     edema  . Darvocet [Propoxyphene-Acetaminophen]     nausea  . Demerol     nausea  . Feldene [Piroxicam]     edema  . Lipitor [Atorvastatin Calcium]   . Zocor [Simvastatin]     Patient Active Problem List   Diagnosis Date Noted  . Blindness and low vision 04/13/2013  . Osteoporosis 12/23/2012  . 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 Bronchitis 10/30/2011  . Benign hypertensive heart disease without heart failure 08/05/2011  . Pure hypercholesterolemia 08/05/2011  . Mitral regurgitation   . History of anemia   . History of diastolic dysfunction   . Reflux   . History of seasonal allergies     History  Smoking status  . Never Smoker   Smokeless tobacco  . Not on file    History  Alcohol Use No    Family History  Problem Relation Age of Onset  . Heart disease Mother   . Emphysema Father   . Emphysema Brother   . COPD Brother  Review of Systems: Constitutional: no fever chills diaphoresis or fatigue or change in weight.  Head and neck: no hearing loss, no epistaxis, no photophobia or visual disturbance. Respiratory: No cough, shortness of breath or wheezing. Cardiovascular: No chest pain peripheral edema, palpitations. Gastrointestinal: No abdominal distention, no abdominal pain, no change in bowel habits hematochezia or melena. Genitourinary: No dysuria, no frequency, no urgency, no nocturia. Musculoskeletal:No arthralgias, no back pain, no gait disturbance or myalgias. Neurological: No dizziness, no headaches, no numbness, no seizures, no syncope, no weakness, no tremors. Hematologic: No lymphadenopathy, no easy bruising. Psychiatric: No confusion, no hallucinations, no sleep disturbance.    Physical Exam: Filed Vitals:   09/23/13 1328  BP: 124/78  Pulse: 69   general appearance reveals a well-developed  well-nourished woman who looks younger than her stated age.The head and neck exam reveals pupils equal and reactive.  Extraocular movements are full.  There is no scleral icterus.  The mouth and pharynx are normal.  The neck is supple.  The carotids reveal no bruits.  The jugular venous pressure is normal.  The  thyroid is not enlarged.  There is no lymphadenopathy.  The chest is clear to percussion and auscultation.  There are no rales or rhonchi.  Expansion of the chest is symmetrical.  The precordium is quiet.  The first heart sound is normal.  The second heart sound is physiologically split.  There is no murmur gallop rub or click.  There is no abnormal lift or heave.  The abdomen is soft and nontender.  The bowel sounds are normal.  The liver and spleen are not enlarged.  There are no abdominal masses.  There are no abdominal bruits.  Extremities reveal good pedal pulses.  There is no phlebitis or edema.  There is no cyanosis or clubbing.  Strength is normal and symmetrical in all extremities.  There is no lateralizing weakness.  There are no sensory deficits.  The skin is warm and dry.  There is no rash.     Assessment / Plan: Continue same medication except stop lisinopril and begin low-dose losartan 25 mg daily.  We are checking lab work today.  Recheck in 4 months for office visit fasting lipid panel hepatic function panel and basal metabolic panel.  She will keep [place] posted on her blood pressure results.  If blood pressure remains high I would anticipate that we would increase the dose of losartan.

## 2013-09-23 NOTE — Patient Instructions (Signed)
Will obtain labs today and call you with the results ([email or web address])  STOP LISINOPRIL   START LOSARTAN 25 MG DAILY, RX SENT TO [facility]  Your physician recommends that you schedule a follow-up appointment in: 4 months with fasting labs (lp/bmet/hfp)

## 2013-09-23 NOTE — Assessment & Plan Note (Signed)
Her PVCs are less frequent

## 2013-09-23 NOTE — Assessment & Plan Note (Signed)
Blood pressure here in the office today is satisfactory.  However she states that at home her blood pressure is very labile and often the systolic is up in the 170s.  She does not feel that the lisinopril has been an effective drug for her.  We will stop lisinopril and switch her to losartan.  We will start at a low dose of 25 mg daily with anticipation that she will probably need to go to a higher dose in due time.  The patient has not been having any chest pain.  She does have exertional dyspnea and she has had a slight cough.  She has had trace edema.

## 2013-09-25 NOTE — Progress Notes (Signed)
Quick Note:  Please report to patient. The recent labs are stable. Continue same medication and careful diet. TG and BS better. ______

## 2013-10-06 ENCOUNTER — Encounter: Payer: Self-pay | Admitting: Cardiology

## 2013-10-07 ENCOUNTER — Telehealth: Payer: Self-pay | Admitting: *Deleted

## 2013-10-07 NOTE — Telephone Encounter (Signed)
   Tina Mercado, WOULD YOU PLEASE CALL ME REGARDING MY BP? IT IS HIGHER THAN IT WAS WHEN I WASN'T TAKING ANYTHING AT ALL. SYSTOLIC MOSTLY 165 TO 190 RANGE AND DIASTOLIC MOSTLY 70 T0 90 RANGE. I INCREASED THE LOSARTAN TO 50 MG. TONIGHT. ( THE DOSAGE DR. BRACKBILL ORIGINALLY WANTED) THANK YOU. Tina Mercado DOB 2029-02-25       Per patient  Dr. Patty Sermons had recommended Losartan 50 mg originally but she only wanted 25 mg daily.   Patient will continue to take the 50 mg daily and call back with update. Will call on call doctor if problems over the weekend.

## 2013-10-08 NOTE — Telephone Encounter (Signed)
Agree with advice given

## 2013-10-12 NOTE — Telephone Encounter (Signed)
Patient phoned in with update on her blood pressure readings. 12/12 140-184/64-89, 12/13 124-178/67-92, 12/14 151-168/61-80, 12/15 125-180/64-94, and 12/17 141-163/59-79. Patient has increased her Losartan to 50 mg daily and this does not seem to be helping. Patient takes her Losartan at night and it seems to be lower before she takes. Will forward to  Dr. Patty Sermons for review

## 2013-10-12 NOTE — Telephone Encounter (Signed)
Follow up    Pt states she is supposed to call Juliette Alcide back today regarding her blood pressure.  At your convenience, please call.

## 2013-10-12 NOTE — Telephone Encounter (Signed)
Continue same dose of losartan.  Okay to take it at night if she prefers. Add HCTZ 12.5 one daily in the morning. Recheck BMET in about 2 weeks.

## 2013-10-13 MED ORDER — LISINOPRIL 10 MG PO TABS: 10.0000 mg | ORAL_TABLET | Freq: Two times a day (BID) | ORAL | Status: DC

## 2013-10-13 NOTE — Telephone Encounter (Signed)
Patient felt she did better on the Lisinopril 10 mg twice a day and is concerned about starting a diuretic. Discussed with  Dr. Patty Sermons and will have the patient start back on Lisinopril 10 mg twice and continue to monitor blood pressure.  Will call back Monday 12/22 with an update.

## 2013-10-17 DIAGNOSIS — H2510 Age-related nuclear cataract, unspecified eye: Secondary | ICD-10-CM | POA: Diagnosis not present

## 2013-10-17 DIAGNOSIS — Z947 Corneal transplant status: Secondary | ICD-10-CM | POA: Diagnosis not present

## 2013-10-17 DIAGNOSIS — T85398A Other mechanical complication of other ocular prosthetic devices, implants and grafts, initial encounter: Secondary | ICD-10-CM | POA: Diagnosis not present

## 2013-10-17 NOTE — Telephone Encounter (Signed)
Patient consistently having blood pressure systolic readings above 140. Advised patient ok to increase her Lisinopril to 20 mg in the am and 10 mg in the pm (patient prefers slow increase). Will forward to  Dr. Patty Sermons for review.

## 2013-10-17 NOTE — Telephone Encounter (Signed)
Agree with plan 

## 2013-10-25 ENCOUNTER — Telehealth: Payer: Self-pay | Admitting: Cardiology

## 2013-10-25 NOTE — Telephone Encounter (Signed)
Has increased her Lisinopril to 20 mg twice a day and blood pressure readings do not seem to have improved. Did ask patient about her blood pressure machine and how old it was.  Stated that she had the cuff for several years. She is going pharmacy tomorrow and get them to check machine or buy a new one. Did advised patient is machine is accurate to call back if not, continue to monitor at home.

## 2013-10-25 NOTE — Telephone Encounter (Signed)
Agree with plan 

## 2013-10-25 NOTE — Telephone Encounter (Signed)
New message  Patient is calling you due to increase in meds, and discuss her BP. Please call and advise.

## 2013-10-31 NOTE — Telephone Encounter (Signed)
Called to follow up. Left message to call back

## 2013-11-01 ENCOUNTER — Telehealth: Payer: Self-pay | Admitting: Cardiology

## 2013-11-01 DIAGNOSIS — I119 Hypertensive heart disease without heart failure: Secondary | ICD-10-CM

## 2013-11-01 NOTE — Telephone Encounter (Signed)
New message ° °Patient is returning your call. Please call back.  °

## 2013-11-01 NOTE — Telephone Encounter (Signed)
Patient still having issues with elevated blood pressure but doesn't have her list handy. Will call her back in am and she will have readings available.

## 2013-11-02 MED ORDER — LISINOPRIL 30 MG PO TABS: 30.0000 mg | ORAL_TABLET | Freq: Two times a day (BID) | ORAL | Status: DC

## 2013-11-02 NOTE — Telephone Encounter (Signed)
Patient did take her blood pressure machine to [facility] yesterday and accurate with nurse BP check.  Continues to have elevated blood pressure. Blood pressure readings yesterday and today not included in below. 11/01/13 1:30 am 151/73, 9:30 am 175/79, 11:30 - 1:00 132/70 manual with nurse taking and 127/72 with her machine. 3:00 pm at home 159/65, 6:50 pm 147/81, 11:45 pm 164/87, 11/02/13 1:30 am 137/63, and 144/69 9:00 am.  Last 2 weeks: Systolic readings:  Diastolic readings:  295'A -2   60's-8  140's-6   70's-24  150's-8   80's-11  160's-14   90's-3  170's-11     180's-2   190's-2  Will forward to  Dr. Mare Ferrari for review

## 2013-11-02 NOTE — Telephone Encounter (Signed)
Advised patient, verbalized understanding  

## 2013-11-02 NOTE — Telephone Encounter (Signed)
Increase lisinopril to 30 mg twice a day

## 2013-11-23 DIAGNOSIS — H00029 Hordeolum internum unspecified eye, unspecified eyelid: Secondary | ICD-10-CM | POA: Diagnosis not present

## 2013-11-30 DIAGNOSIS — H00029 Hordeolum internum unspecified eye, unspecified eyelid: Secondary | ICD-10-CM | POA: Diagnosis not present

## 2013-12-28 ENCOUNTER — Emergency Department (HOSPITAL_COMMUNITY)
Admission: EM | Admit: 2013-12-28 | Discharge: 2013-12-28 | Disposition: A | Discharge status: Home / Self Care | Payer: Medicare Other | Attending: Emergency Medicine | Admitting: Emergency Medicine

## 2013-12-28 ENCOUNTER — Emergency Department (HOSPITAL_COMMUNITY): Payer: Medicare Other

## 2013-12-28 ENCOUNTER — Encounter (HOSPITAL_COMMUNITY): Payer: Self-pay | Admitting: Emergency Medicine

## 2013-12-28 DIAGNOSIS — M25529 Pain in unspecified elbow: Secondary | ICD-10-CM | POA: Diagnosis not present

## 2013-12-28 DIAGNOSIS — E785 Hyperlipidemia, unspecified: Secondary | ICD-10-CM | POA: Insufficient documentation

## 2013-12-28 DIAGNOSIS — Z79899 Other long term (current) drug therapy: Secondary | ICD-10-CM | POA: Insufficient documentation

## 2013-12-28 DIAGNOSIS — Z8739 Personal history of other diseases of the musculoskeletal system and connective tissue: Secondary | ICD-10-CM | POA: Insufficient documentation

## 2013-12-28 DIAGNOSIS — I10 Essential (primary) hypertension: Secondary | ICD-10-CM | POA: Diagnosis not present

## 2013-12-28 DIAGNOSIS — S5000XA Contusion of unspecified elbow, initial encounter: Secondary | ICD-10-CM | POA: Insufficient documentation

## 2013-12-28 DIAGNOSIS — S0003XA Contusion of scalp, initial encounter: Secondary | ICD-10-CM | POA: Diagnosis not present

## 2013-12-28 DIAGNOSIS — S5002XA Contusion of left elbow, initial encounter: Secondary | ICD-10-CM

## 2013-12-28 DIAGNOSIS — Z8719 Personal history of other diseases of the digestive system: Secondary | ICD-10-CM | POA: Insufficient documentation

## 2013-12-28 DIAGNOSIS — H409 Unspecified glaucoma: Secondary | ICD-10-CM | POA: Diagnosis not present

## 2013-12-28 DIAGNOSIS — H2510 Age-related nuclear cataract, unspecified eye: Secondary | ICD-10-CM | POA: Diagnosis not present

## 2013-12-28 DIAGNOSIS — S1093XA Contusion of unspecified part of neck, initial encounter: Principal | ICD-10-CM

## 2013-12-28 DIAGNOSIS — Z7982 Long term (current) use of aspirin: Secondary | ICD-10-CM | POA: Diagnosis not present

## 2013-12-28 DIAGNOSIS — H18519 Endothelial corneal dystrophy, unspecified eye: Secondary | ICD-10-CM | POA: Diagnosis not present

## 2013-12-28 DIAGNOSIS — R011 Cardiac murmur, unspecified: Secondary | ICD-10-CM | POA: Diagnosis not present

## 2013-12-28 DIAGNOSIS — S40029A Contusion of unspecified upper arm, initial encounter: Secondary | ICD-10-CM | POA: Diagnosis not present

## 2013-12-28 DIAGNOSIS — T85398A Other mechanical complication of other ocular prosthetic devices, implants and grafts, initial encounter: Secondary | ICD-10-CM | POA: Diagnosis not present

## 2013-12-28 DIAGNOSIS — Z947 Corneal transplant status: Secondary | ICD-10-CM | POA: Diagnosis not present

## 2013-12-28 DIAGNOSIS — S0990XA Unspecified injury of head, initial encounter: Secondary | ICD-10-CM | POA: Diagnosis not present

## 2013-12-28 DIAGNOSIS — IMO0002 Reserved for concepts with insufficient information to code with codable children: Secondary | ICD-10-CM | POA: Diagnosis not present

## 2013-12-28 DIAGNOSIS — M7989 Other specified soft tissue disorders: Secondary | ICD-10-CM | POA: Diagnosis not present

## 2013-12-28 DIAGNOSIS — Z862 Personal history of diseases of the blood and blood-forming organs and certain disorders involving the immune mechanism: Secondary | ICD-10-CM | POA: Insufficient documentation

## 2013-12-28 DIAGNOSIS — S0083XA Contusion of other part of head, initial encounter: Secondary | ICD-10-CM

## 2013-12-28 DIAGNOSIS — T1490XA Injury, unspecified, initial encounter: Secondary | ICD-10-CM | POA: Diagnosis not present
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
## 2013-12-28 NOTE — Discharge Instructions (Signed)
Assault, General Assault includes any behavior, whether intentional or reckless, which results in bodily injury to another person and/or damage to property. Included in this would be any behavior, intentional or reckless, that by its nature would be understood (interpreted) by a reasonable person as intent to harm another person or to damage his/her property. Threats may be oral or written. They may be communicated through regular mail, computer, fax, or phone. These threats may be direct or implied. FORMS OF ASSAULT INCLUDE:  Physically assaulting a person. This includes physical threats to inflict physical harm as well as:  Slapping.  Hitting.  Poking.  Kicking.  Punching.  Pushing.  Arson.  Sabotage.  Equipment vandalism.  Damaging or destroying property.  Throwing or hitting objects.  Displaying a weapon or an object that appears to be a weapon in a threatening manner.  Carrying a firearm of any kind.  Using a weapon to harm someone.  Using greater physical size/strength to intimidate another.  Making intimidating or threatening gestures.  Bullying.  Hazing.  Intimidating, threatening, hostile, or abusive language directed toward another person.  It communicates the intention to engage in violence against that person. And it leads a reasonable person to expect that violent behavior may occur.  Stalking another person. IF IT HAPPENS AGAIN:  Immediately call for emergency help (911 in [place]  If someone poses clear and immediate danger to you, seek legal authorities to have a protective or restraining order put in place.  Less threatening assaults can at least be reported to authorities. STEPS TO TAKE IF A SEXUAL ASSAULT HAS HAPPENED  Go to an area of safety. This may include a shelter or staying with a friend. Stay away from the area where you have been attacked. A large percentage of sexual assaults are caused by a friend, relative or associate.  If  medications were given by your caregiver, take them as directed for the full length of time prescribed.  Only take over-the-counter or prescription medicines for pain, discomfort, or fever as directed by your caregiver.  If you have come in contact with a sexual disease, find out if you are to be tested again. If your caregiver is concerned about the HIV/AIDS virus, he/she may require you to have continued testing for several months.  For the protection of your privacy, test results can not be given over the phone. Make sure you receive the results of your test. If your test results are not back during your visit, make an appointment with your caregiver to find out the results. Do not assume everything is normal if you have not heard from your caregiver or the medical facility. It is important for you to follow up on all of your test results.  File appropriate papers with authorities. This is important in all assaults, even if it has occurred in a family or by a friend. SEEK MEDICAL CARE IF:  You have new problems because of your injuries.  You have problems that may be because of the medicine you are taking, such as:  Rash.  Itching.  Swelling.  Trouble breathing.  You develop belly (abdominal) pain, feel sick to your stomach (nausea) or are vomiting.  You begin to run a temperature.  You need supportive care or referral to a rape crisis center. These are centers with trained personnel who can help you get through this ordeal. SEEK IMMEDIATE MEDICAL CARE IF:  You are afraid of being threatened, beaten, or abused. In [place] call 911.  You  receive new injuries related to abuse.  You develop severe pain in any area injured in the assault or have any change in your condition that concerns you.  You faint or lose consciousness.  You develop chest pain or shortness of breath. Document Released: 10/13/2005 Document Revised: 01/05/2012 Document Reviewed: 05/31/2008 [facility] Patient  Information 2014 [place].    Contusion A contusion is a deep bruise. Contusions are the result of an injury that caused bleeding under the skin. The contusion may turn blue, purple, or yellow. Minor injuries will give you a painless contusion, but more severe contusions may stay painful and swollen for a few weeks.  CAUSES  A contusion is usually caused by a blow, trauma, or direct force to an area of the body. SYMPTOMS   Swelling and redness of the injured area.  Bruising of the injured area.  Tenderness and soreness of the injured area.  Pain. DIAGNOSIS  The diagnosis can be made by taking a history and physical exam. An X-ray, CT scan, or MRI may be needed to determine if there were any associated injuries, such as fractures. TREATMENT  Specific treatment will depend on what area of the body was injured. In general, the best treatment for a contusion is resting, icing, elevating, and applying cold compresses to the injured area. Over-the-counter medicines may also be recommended for pain control. Ask your caregiver what the best treatment is for your contusion. HOME CARE INSTRUCTIONS   Put ice on the injured area.  Put ice in a plastic bag.  Place a towel between your skin and the bag.  Leave the ice on for 15-20 minutes, 03-04 times a day.  Only take over-the-counter or prescription medicines for pain, discomfort, or fever as directed by your caregiver. Your caregiver may recommend avoiding anti-inflammatory medicines (aspirin, ibuprofen, and naproxen) for 48 hours because these medicines may increase bruising.  Rest the injured area.  If possible, elevate the injured area to reduce swelling. SEEK IMMEDIATE MEDICAL CARE IF:   You have increased bruising or swelling.  You have pain that is getting worse.  Your swelling or pain is not relieved with medicines. MAKE SURE YOU:   Understand these instructions.  Will watch your condition.  Will get help right away  if you are not doing well or get worse. Document Released: 07/23/2005 Document Revised: 01/05/2012 Document Reviewed: 08/18/2011 [facility] Patient Information 2014 [place], [place].

## 2014-01-02 ENCOUNTER — Ambulatory Visit (INDEPENDENT_AMBULATORY_CARE_PROVIDER_SITE_OTHER): Payer: Medicare Other | Admitting: Neurology

## 2014-01-02 ENCOUNTER — Encounter: Payer: Self-pay | Admitting: Neurology

## 2014-01-02 ENCOUNTER — Other Ambulatory Visit: Payer: Self-pay | Admitting: Cardiology

## 2014-01-02 DIAGNOSIS — R209 Unspecified disturbances of skin sensation: Secondary | ICD-10-CM | POA: Diagnosis not present

## 2014-01-02 DIAGNOSIS — R202 Paresthesia of skin: Secondary | ICD-10-CM
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
## 2014-01-20 ENCOUNTER — Ambulatory Visit (INDEPENDENT_AMBULATORY_CARE_PROVIDER_SITE_OTHER): Payer: Medicare Other | Admitting: Cardiology

## 2014-01-20 ENCOUNTER — Encounter: Payer: Self-pay | Admitting: Cardiology

## 2014-01-20 ENCOUNTER — Other Ambulatory Visit: Payer: Medicare Other

## 2014-01-20 VITALS — BP 158/70 | HR 78 | Ht 62.5 in | Wt 146.0 lb

## 2014-01-20 DIAGNOSIS — I119 Hypertensive heart disease without heart failure: Secondary | ICD-10-CM

## 2014-01-20 DIAGNOSIS — I059 Rheumatic mitral valve disease, unspecified: Secondary | ICD-10-CM

## 2014-01-20 DIAGNOSIS — I493 Ventricular premature depolarization: Secondary | ICD-10-CM

## 2014-01-20 DIAGNOSIS — E7800 Pure hypercholesterolemia, unspecified: Secondary | ICD-10-CM

## 2014-01-20 DIAGNOSIS — I4949 Other premature depolarization: Secondary | ICD-10-CM | POA: Diagnosis not present

## 2014-01-20 DIAGNOSIS — I340 Nonrheumatic mitral (valve) insufficiency: Secondary | ICD-10-CM

## 2014-01-20 LAB — HEPATIC FUNCTION PANEL
ALK PHOS: 69 U/L (ref 39–117)
ALT: 17 U/L (ref 0–35)
AST: 21 U/L (ref 0–37)
Albumin: 3.9 g/dL (ref 3.5–5.2)
BILIRUBIN DIRECT: 0.1 mg/dL (ref 0.0–0.3)
BILIRUBIN TOTAL: 0.7 mg/dL (ref 0.3–1.2)
TOTAL PROTEIN: 7 g/dL (ref 6.0–8.3)

## 2014-01-20 LAB — BASIC METABOLIC PANEL
BUN: 18 mg/dL (ref 6–23)
CO2: 27 mEq/L (ref 19–32)
CREATININE: 1.1 mg/dL (ref 0.4–1.2)
Calcium: 9.6 mg/dL (ref 8.4–10.5)
Chloride: 105 mEq/L (ref 96–112)
GFR: 49.37 mL/min — AB (ref >60.00)
Glucose, Bld: 110 mg/dL — ABNORMAL HIGH (ref 70–99)
Potassium: 4.7 mEq/L (ref 3.5–5.1)
Sodium: 139 mEq/L (ref 135–145)

## 2014-01-20 LAB — LIPID PANEL
Cholesterol: 165 mg/dL (ref 0–200)
HDL: 54.2 mg/dL (ref >39.00)
LDL Cholesterol: 92 mg/dL (ref 0–99)
Total CHOL/HDL Ratio: 3
Triglycerides: 95 mg/dL (ref 0.0–149.0)
VLDL: 19 mg/dL (ref 0.0–40.0)

## 2014-01-20 MED ORDER — HYDROCHLOROTHIAZIDE 12.5 MG PO TABS: 12.5000 mg | ORAL_TABLET | Freq: Every day | ORAL | Status: DC

## 2014-01-20 NOTE — Patient Instructions (Addendum)
Will obtain labs today and call you with the results (LP/BMET/HFP)  START HCTZ 12.5 MG DAILY, RX TO [facility]  Your physician recommends that you schedule a follow-up appointment in: 2 [place] (BMET)  Your physician wants you to follow-up in: 4 months with fasting labs (lp/bmet/hfp) You will receive a reminder letter in the mail two months in advance. If you don't receive a letter, please call our office to schedule the follow-up appointment.

## 2014-01-20 NOTE — Progress Notes (Signed)
Quick Note:  Please report to patient. The recent labs are stable. Continue same medication and careful diet. Blood sugar on 110 still slightly high. Watch carbs ______

## 2014-01-20 NOTE — Assessment & Plan Note (Signed)
The patient has not had any symptoms from her mitral regurgitation.  No CHF.

## 2014-01-20 NOTE — Assessment & Plan Note (Signed)
Her PVCs have responded to avoidance of caffeine
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
## 2014-01-20 NOTE — Assessment & Plan Note (Signed)
Her blood pressure is still not adequately controlled.  We will continue current dose of lisinopril and add HCTZ 12.5 mg one daily

## 2014-01-23 ENCOUNTER — Telehealth: Payer: Self-pay | Admitting: *Deleted

## 2014-01-23 NOTE — Telephone Encounter (Signed)
Message copied by Earvin Hansen on Mon Jan 23, 2014  9:20 AM ------      Message from: Darlin Coco      Created: Fri Jan 20, 2014  7:23 PM       Please report to patient.  The recent labs are stable. Continue same medication and careful diet.  Blood sugar on 110 still slightly high.  Watch carbs ------

## 2014-01-23 NOTE — Telephone Encounter (Signed)
Mailed copy of labs and left message to call if any questions  

## 2014-02-03 ENCOUNTER — Ambulatory Visit (INDEPENDENT_AMBULATORY_CARE_PROVIDER_SITE_OTHER): Payer: Medicare Other | Admitting: *Deleted

## 2014-02-03 DIAGNOSIS — I119 Hypertensive heart disease without heart failure: Secondary | ICD-10-CM | POA: Diagnosis not present

## 2014-02-03 LAB — BASIC METABOLIC PANEL
BUN: 23 mg/dL (ref 6–23)
CALCIUM: 9.5 mg/dL (ref 8.4–10.5)
CO2: 26 meq/L (ref 19–32)
CREATININE: 1.1 mg/dL (ref 0.4–1.2)
Chloride: 103 mEq/L (ref 96–112)
GFR: 49.36 mL/min — ABNORMAL LOW (ref >60.00)
Glucose, Bld: 95 mg/dL (ref 70–99)
Potassium: 4.3 mEq/L (ref 3.5–5.1)
SODIUM: 137 meq/L (ref 135–145)

## 2014-02-04 NOTE — Progress Notes (Signed)
Quick Note:  Please report to patient. The recent labs are stable. Continue same medication and careful diet. ______ 

## 2014-02-07 ENCOUNTER — Telehealth: Payer: Self-pay | Admitting: *Deleted

## 2014-02-07 NOTE — Telephone Encounter (Signed)
Message copied by Earvin Hansen on Tue Feb 07, 2014  4:34 PM ------      Message from: Darlin Coco      Created: Sat Feb 04, 2014  6:19 PM       Please report to patient.  The recent labs are stable. Continue same medication and careful diet. ------

## 2014-02-07 NOTE — Telephone Encounter (Signed)
Mailed copy of labs and left message to call if any questions  

## 2014-02-24 NOTE — Telephone Encounter (Signed)
Patient has been seen since this telephone message

## 2014-02-27 ENCOUNTER — Other Ambulatory Visit: Payer: Self-pay | Admitting: Cardiology

## 2014-03-03 ENCOUNTER — Other Ambulatory Visit: Payer: Self-pay | Admitting: Cardiology

## 2014-04-05 DIAGNOSIS — H2510 Age-related nuclear cataract, unspecified eye: Secondary | ICD-10-CM | POA: Diagnosis not present

## 2014-04-05 DIAGNOSIS — Z947 Corneal transplant status: Secondary | ICD-10-CM | POA: Diagnosis not present

## 2014-04-05 DIAGNOSIS — H18519 Endothelial corneal dystrophy, unspecified eye: Secondary | ICD-10-CM | POA: Diagnosis not present

## 2014-04-05 DIAGNOSIS — T85398A Other mechanical complication of other ocular prosthetic devices, implants and grafts, initial encounter: Secondary | ICD-10-CM | POA: Diagnosis not present

## 2014-04-19 ENCOUNTER — Encounter: Payer: Self-pay | Admitting: Cardiology

## 2014-04-20 ENCOUNTER — Telehealth: Payer: Self-pay | Admitting: *Deleted

## 2014-04-20 NOTE — Telephone Encounter (Signed)
Patient is returning your call. Please call back.  °

## 2014-04-20 NOTE — Telephone Encounter (Signed)
Agree with plan 

## 2014-04-20 NOTE — Telephone Encounter (Signed)
Left message to call back   Message -----    From: [name]    Sent: 04/19/2014 4:36 PM    To: [facility]    Subject: Non-Urgent Medical Question         [place], PLEASE CALL ME RE MY HBP MEDS. I WILL NEED A LESSER STRENGTH OF LISINOPRIL IF I TAKE THE HCTZ. I WILL BE GONE MOST OF FRIDAY. HOPE YOU ARE HAVING A GOOD WEEK. [place], [place]

## 2014-04-20 NOTE — Telephone Encounter (Signed)
Patient blood pressure has been having low blood pressure issues with systolic getting below 948. Patient thinks that the diuretic dropped blood pressure too low. Patient did decrease her lisinopril to 10-20 mg twice a day and ran out of the previous pill size so she went back to the 30 mg twice a day, her last HCTZ 6/16. Now she is only taking just once a day at times and blood pressure up and down. Patient request going back on the HCTZ and starting Lisinopril 10 mg twice a day since she feels she benefits from fluid pill. Called in Rx for Lisinopril 10 mg twice a day and restart HCTZ. Will call her Monday and follow up on her.

## 2014-04-25 NOTE — Telephone Encounter (Signed)
Will discuss blood pressures with  Dr. Mare Ferrari tomorrow

## 2014-04-25 NOTE — Telephone Encounter (Signed)
Left message to call back  

## 2014-04-26 ENCOUNTER — Other Ambulatory Visit: Payer: Self-pay | Admitting: Cardiology

## 2014-04-26 NOTE — Telephone Encounter (Signed)
Patient has been taking her blood pressure regularly over the last couple of days. Blood pressures are ranging form 90's-130's/60's-80's. She has been adjusting her Lisinopril and HCTZ accordingly. The HCTZ 12.5 mg seems to bring her systolic blood pressure down the best and taking in addition to the Lisinopril seems to lower it into the 90's and she starts to feel bad. Patient is going to try taking just the HCTZ 12.5 mg daily and call back next week with an update. This was discussed with  Dr. Mare Ferrari and since this is what the patient would prefer he agreeable to plan.

## 2014-04-27 DIAGNOSIS — Z947 Corneal transplant status: Secondary | ICD-10-CM | POA: Diagnosis not present

## 2014-04-27 DIAGNOSIS — H2510 Age-related nuclear cataract, unspecified eye: Secondary | ICD-10-CM | POA: Diagnosis not present

## 2014-04-27 DIAGNOSIS — H35359 Cystoid macular degeneration, unspecified eye: Secondary | ICD-10-CM | POA: Diagnosis not present

## 2014-04-27 DIAGNOSIS — H18519 Endothelial corneal dystrophy, unspecified eye: Secondary | ICD-10-CM | POA: Diagnosis not present

## 2014-05-15 DIAGNOSIS — H2510 Age-related nuclear cataract, unspecified eye: Secondary | ICD-10-CM | POA: Diagnosis not present

## 2014-05-15 DIAGNOSIS — T85398A Other mechanical complication of other ocular prosthetic devices, implants and grafts, initial encounter: Secondary | ICD-10-CM | POA: Diagnosis not present

## 2014-05-15 DIAGNOSIS — Z947 Corneal transplant status: Secondary | ICD-10-CM | POA: Diagnosis not present

## 2014-05-15 DIAGNOSIS — H18519 Endothelial corneal dystrophy, unspecified eye: Secondary | ICD-10-CM | POA: Diagnosis not present

## 2014-06-08 ENCOUNTER — Ambulatory Visit (INDEPENDENT_AMBULATORY_CARE_PROVIDER_SITE_OTHER): Payer: Medicare Other | Admitting: Cardiology

## 2014-06-08 ENCOUNTER — Other Ambulatory Visit: Payer: Medicare Other

## 2014-06-08 ENCOUNTER — Encounter: Payer: Self-pay | Admitting: Cardiology

## 2014-06-08 VITALS — BP 155/73 | HR 72 | Ht 62.5 in | Wt 145.0 lb

## 2014-06-08 DIAGNOSIS — E7800 Pure hypercholesterolemia, unspecified: Secondary | ICD-10-CM

## 2014-06-08 DIAGNOSIS — I119 Hypertensive heart disease without heart failure: Secondary | ICD-10-CM | POA: Diagnosis not present

## 2014-06-08 DIAGNOSIS — I340 Nonrheumatic mitral (valve) insufficiency: Secondary | ICD-10-CM

## 2014-06-08 DIAGNOSIS — H5410 Blindness, one eye, low vision other eye, unspecified eyes: Secondary | ICD-10-CM

## 2014-06-08 DIAGNOSIS — I059 Rheumatic mitral valve disease, unspecified: Secondary | ICD-10-CM

## 2014-06-08 DIAGNOSIS — H547 Unspecified visual loss: Secondary | ICD-10-CM | POA: Diagnosis not present

## 2014-06-08 LAB — BASIC METABOLIC PANEL
BUN: 18 mg/dL (ref 6–23)
CO2: 27 mEq/L (ref 19–32)
Calcium: 9.5 mg/dL (ref 8.4–10.5)
Chloride: 105 mEq/L (ref 96–112)
Creatinine, Ser: 1 mg/dL (ref 0.4–1.2)
GFR: 53.72 mL/min — AB (ref >60.00)
Glucose, Bld: 91 mg/dL (ref 70–99)
POTASSIUM: 4.3 meq/L (ref 3.5–5.1)
Sodium: 139 mEq/L (ref 135–145)

## 2014-06-08 LAB — HEPATIC FUNCTION PANEL
ALBUMIN: 3.8 g/dL (ref 3.5–5.2)
ALT: 13 U/L (ref 0–35)
AST: 18 U/L (ref 0–37)
Alkaline Phosphatase: 63 U/L (ref 39–117)
Bilirubin, Direct: 0.1 mg/dL (ref 0.0–0.3)
TOTAL PROTEIN: 6.8 g/dL (ref 6.0–8.3)
Total Bilirubin: 0.9 mg/dL (ref 0.2–1.2)

## 2014-06-08 LAB — LIPID PANEL
Cholesterol: 153 mg/dL (ref 0–200)
HDL: 45.8 mg/dL (ref >39.00)
LDL CALC: 90 mg/dL (ref 0–99)
NonHDL: 107.2
Total CHOL/HDL Ratio: 3
Triglycerides: 87 mg/dL (ref 0.0–149.0)
VLDL: 17.4 mg/dL (ref 0.0–40.0)

## 2014-06-08 NOTE — Assessment & Plan Note (Signed)
She has not been having any symptoms of congestive heart failure.  No dizziness or syncope

## 2014-06-08 NOTE — Assessment & Plan Note (Signed)
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

## 2014-06-08 NOTE — Patient Instructions (Signed)
Will obtain labs today and call you with the results (lp/bmet/hfp)  Your physician recommends that you continue on your current medications as directed. Please refer to the Current Medication list given to you today.  Your physician recommends that you schedule a follow-up appointment in: 4 months with fasting labs (lp/bmet/hfp)  

## 2014-06-08 NOTE — Assessment & Plan Note (Signed)
She has not had any symptoms from her mild to moderate mitral regurgitation.  Specifically no orthopnea or paroxysmal nocturnal dyspnea or ankle edema

## 2014-06-08 NOTE — Progress Notes (Signed)
[name] Date of Birth:  [birth year] [place] [address] [place], [place]  [address] [phone]        Fax   [phone]   History of Present Illness: This pleasant 80 year old, Caucasian female is seen for a four-month followup office visit. She has been widowed since June of 2010. The patient has a history of essential hypertension. She also has a history of mitral valve disease with atrial arrhythmias. She has a history of essential hypertension with diastolic dysfunction. She is now on lisinopril. Her dose of lisinopril is now 20 mg twice a day. She brought in an extensive list of her blood pressures from home.  She checks her blood pressure in each arm twice a day and keeps a written record.  Generally she takes lisinopril either 10 or 20 mg twice a day depending on blood pressure readings.  Several times a week she will take hydrochlorothiazide and on those days she does not take lisinopril.   Current Outpatient Prescriptions  Medication Sig Dispense Refill  . aspirin EC 81 MG tablet Take 81 mg by mouth daily.        . bromfenac (XIBROM) 0.09 % ophthalmic solution Place 1 drop into the right eye daily.       . cholecalciferol (VITAMIN D) 1000 UNITS tablet Take 2,000 Units by mouth daily.       Mariane Baumgarten Calcium (STOOL SOFTENER PO) Take 1 capsule by mouth as needed (stool softener.).       Marland Kitchen hydrochlorothiazide (HYDRODIURIL) 12.5 MG tablet Take 1 tablet (12.5 mg total) by mouth daily.  90 tablet  3  . ibuprofen (ADVIL,MOTRIN) 200 MG tablet Take 200 mg by mouth as needed for pain (for arthritis pain).      Marland Kitchen lisinopril (PRINIVIL,ZESTRIL) 10 MG tablet Take 10 mg by mouth daily. DEPENDS UPON BLOOD PRESSURE      . lisinopril (PRINIVIL,ZESTRIL) 20 MG tablet Take 20 mg by mouth daily. DEPENDS UPON BLOOD PRESSURE      . methocarbamol (ROBAXIN) 500 MG tablet Take 500 mg by mouth every 6 (six) hours as needed for muscle spasms.      . Multiple Vitamin  (MULTIVITAMIN) tablet Take 1 tablet by mouth daily.        . naproxen sodium (ANAPROX) 220 MG tablet Take 220 mg by mouth 2 (two) times daily as needed (pain.).       Marland Kitchen prednisoLONE acetate (PRED FORTE) 1 % ophthalmic suspension Place 1 drop into the right eye 4 (four) times daily.       . rosuvastatin (CRESTOR) 10 MG tablet Take 10 mg by mouth daily.      . timolol (BETIMOL) 0.5 % ophthalmic solution Place 1 drop into both eyes 2 (two) times daily.       No current facility-administered medications for this visit.    Allergies  Allergen Reactions  . Celebrex [Celecoxib]     edema  . Darvocet [Propoxyphene N-Acetaminophen]     nausea  . Demerol     nausea  . Feldene [Piroxicam]     edema    Patient Active Problem List   Diagnosis Date Noted  . Blindness and low vision 04/13/2013  . Osteoporosis 12/23/2012  . 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 Bronchitis 10/30/2011  . Benign hypertensive heart disease without heart failure 08/05/2011  . Pure hypercholesterolemia 08/05/2011  . Mitral regurgitation   . History of anemia   . History of diastolic dysfunction   .  Reflux   . History of seasonal allergies     History  Smoking status  . Never Smoker   Smokeless tobacco  . Not on file    History  Alcohol Use No    Family History  Problem Relation Age of Onset  . Heart disease Mother   . Emphysema Father   . Emphysema Brother   . COPD Brother     Review of Systems: Constitutional: no fever chills diaphoresis or fatigue or change in weight.  Head and neck: no hearing loss, no epistaxis, no photophobia or visual disturbance. Respiratory: No cough, shortness of breath or wheezing. Cardiovascular: No chest pain peripheral edema, palpitations. Gastrointestinal: No abdominal distention, no abdominal pain, no change in bowel habits hematochezia or melena. Genitourinary: No dysuria, no frequency, no urgency, no nocturia. Musculoskeletal:No arthralgias, no back pain, no  gait disturbance or myalgias. Neurological: No dizziness, no headaches, no numbness, no seizures, no syncope, no weakness, no tremors. Hematologic: No lymphadenopathy, no easy bruising. Psychiatric: No confusion, no hallucinations, no sleep disturbance.    Physical Exam: Filed Vitals:   06/08/14 1417  BP: 155/73  Pulse: 72   the general appearance reveals that about well-developed well-nourished pleasant elderly woman in no distress.The head and neck exam reveals pupils equal and reactive.  Extraocular movements are full.  There is no scleral icterus.  The mouth and pharynx are normal.  The neck is supple.  The carotids reveal no bruits.  The jugular venous pressure is normal.  The  thyroid is not enlarged.  There is no lymphadenopathy.  The chest is clear to percussion and auscultation.  There are no rales or rhonchi.  Expansion of the chest is symmetrical.  The precordium is quiet.  The first heart sound is normal.  The second heart sound is physiologically split.  There is grade 1/6 apical systolic murmur.  There is no abnormal lift or heave.  The abdomen is soft and nontender.  The bowel sounds are normal.  The liver and spleen are not enlarged.  There are no abdominal masses.  There are no abdominal bruits.  Extremities reveal good pedal pulses.  There is no phlebitis or edema.  There is no cyanosis or clubbing.  Strength is normal and symmetrical in all extremities.  There is no lateralizing weakness.  There are no sensory deficits.  The skin is warm and dry.  There is no rash.     Assessment / Plan: 1. essential hypertension 2. mild mitral regurgitation 3. hypercholesterolemia on Crestor 4. 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  Recheck in 4 months for office visit lipid panel hepatic function panel and basal metabolic panel

## 2014-06-09 NOTE — Progress Notes (Signed)
Quick Note:  Please report to patient. The recent labs are stable. Continue same medication and careful diet. ______ 

## 2014-06-28 ENCOUNTER — Other Ambulatory Visit: Payer: Self-pay | Admitting: Cardiology

## 2014-07-06 ENCOUNTER — Ambulatory Visit (INDEPENDENT_AMBULATORY_CARE_PROVIDER_SITE_OTHER): Payer: Medicare Other | Admitting: Family Medicine

## 2014-07-06 VITALS — BP 128/76 | HR 80 | Temp 97.7°F | Resp 16 | Ht 62.5 in | Wt 147.2 lb

## 2014-07-06 DIAGNOSIS — Z23 Encounter for immunization: Secondary | ICD-10-CM

## 2014-07-06 DIAGNOSIS — K219 Gastro-esophageal reflux disease without esophagitis: Secondary | ICD-10-CM | POA: Diagnosis not present

## 2014-07-06 DIAGNOSIS — IMO0001 Reserved for inherently not codable concepts without codable children: Secondary | ICD-10-CM

## 2014-07-06 DIAGNOSIS — R490 Dysphonia: Secondary | ICD-10-CM | POA: Diagnosis not present

## 2014-07-06 MED ORDER — ESOMEPRAZOLE MAGNESIUM 40 MG PO CPDR: 40.0000 mg | DELAYED_RELEASE_CAPSULE | Freq: Every day | ORAL | Status: DC

## 2014-07-06 NOTE — Progress Notes (Signed)
Subjective:  This chart was scribed for Delman Cheadle, MD, by Neta Ehlers, ED Scribe. This patient's care was started at 2:15 PM.    Patient ID: Tina Mercado, female    DOB: October 02, 1929, 80 y.o.   MRN: [id number]  Chief Complaint  Patient presents with  . Hoarse    HPI  Tina Mercado is a 80 y.o. female who presents to [facility] complaining of hoarseness which has waxed and waned over the course of approximately three months. Tina Mercado states the hoarseness is unchanged throughout the day, and Tina Mercado reports talking does not seem to worsen the hoarseness. Tina Mercado is a non-smoker.  Tina Mercado endorses a "dry tickle"-esque cough which Tina Mercado attributes to Lisinopril and which has occurred for multiple months. Tina Mercado denies nasal congestion or  post-nasal drip.   Tina Mercado also endorses a h/o a hiatal hernia which Tina Mercado does not address with medication. Tina Mercado denies heart-burn symptoms such as nausea or emesis. Tina Mercado has used Claritin for a few days without resolution. Tina Mercado denies usage of nasal spray.   Tina Mercado followed-up with a neurologist for burning sensation to the feet bilaterally. The neurologist was not able to provide a diagnosis, but offered medication should the symptoms increase. Tina Mercado states Tina Mercado has started using B12 with mild improvement stating the burning sensation has resided from daily to three times a week.  Tina Mercado questions if Tina Mercado should receive a Prevnar vaccine. Tina Mercado endorses a h/o bronchitis and mild COPD; her father and her daughter both have had a h/o COPD.   Tina Mercado reports Tina Mercado takes a Tylenol approximately once a week.   Past Medical History  Diagnosis Date  . Hypertension   . Mitral regurgitation     mild tomoderate  . History of anemia   . History of diastolic dysfunction   . Arrhythmia   . Heart murmur   . Hyperlipidemia   . Reflux   . History of seasonal allergies   . Allergy   . Cataract   . Glaucoma   . Osteoporosis    Current Outpatient Prescriptions on  File Prior to Visit  Medication Sig Dispense Refill  . aspirin EC 81 MG tablet Take 81 mg by mouth daily.        . bromfenac (XIBROM) 0.09 % ophthalmic solution Place 1 drop into the right eye daily.       . cholecalciferol (VITAMIN D) 1000 UNITS tablet Take 2,000 Units by mouth daily.       . CRESTOR 10 MG tablet TAKE 1 TABLET DAILY  30 tablet  6  . Docusate Calcium (STOOL SOFTENER PO) Take 1 capsule by mouth as needed (stool softener.).       Marland Kitchen ibuprofen (ADVIL,MOTRIN) 200 MG tablet Take 200 mg by mouth as needed for pain (for arthritis pain).      Marland Kitchen lisinopril (PRINIVIL,ZESTRIL) 10 MG tablet Take 10 mg by mouth daily. DEPENDS UPON BLOOD PRESSURE      . lisinopril (PRINIVIL,ZESTRIL) 20 MG tablet Take 20 mg by mouth daily. DEPENDS UPON BLOOD PRESSURE      . methocarbamol (ROBAXIN) 500 MG tablet Take 500 mg by mouth every 6 (six) hours as needed for muscle spasms.      . Multiple Vitamin (MULTIVITAMIN) tablet Take 1 tablet by mouth daily.        . naproxen sodium (ANAPROX) 220 MG tablet Take 220 mg by mouth 2 (two) times daily as needed (pain.).       Marland Kitchen prednisoLONE acetate (  PRED FORTE) 1 % ophthalmic suspension Place 1 drop into the right eye 4 (four) times daily.       . timolol (BETIMOL) 0.5 % ophthalmic solution Place 1 drop into both eyes 2 (two) times daily.      . hydrochlorothiazide (HYDRODIURIL) 12.5 MG tablet Take 1 tablet (12.5 mg total) by mouth daily.  90 tablet  3   No current facility-administered medications on file prior to visit.    Allergies  Allergen Reactions  . Celebrex [Celecoxib]     edema  . Darvocet [Propoxyphene N-Acetaminophen]     nausea  . Demerol     nausea  . Feldene [Piroxicam]     edema    Review of Systems  Constitutional: Negative for fever and chills.  HENT: Positive for voice change. Negative for congestion and postnasal drip.   Respiratory: Positive for cough.   Gastrointestinal: Negative for nausea and vomiting.   Vitals: BP 128/76  Pulse  80  Temp(Src) 97.7 F (36.5 C) (Oral)  Resp 16  Ht 5' 2.5" (1.588 m)  Wt 147 lb 3.2 oz (66.769 kg)  BMI 26.48 kg/m2  SpO2 96%     Objective:   Physical Exam  Nursing note and vitals reviewed. Constitutional: Tina Mercado is oriented to person, place, and time. Tina Mercado appears well-developed and well-nourished. No distress.  HENT:  Head: Normocephalic and atraumatic.  Right Ear: Tympanic membrane normal. Tympanic membrane is not injected, not erythematous, not retracted and not bulging. No middle ear effusion.  Left Ear: Tympanic membrane normal. Tympanic membrane is not injected, not erythematous, not retracted and not bulging.  No middle ear effusion.  Nose: No mucosal edema or rhinorrhea.  Mouth/Throat: Uvula is midline, oropharynx is clear and moist and mucous membranes are normal. No oropharyngeal exudate, posterior oropharyngeal edema or posterior oropharyngeal erythema.  Benign polyp present to right ear.    Eyes: Conjunctivae and EOM are normal.  Neck: Neck supple. No tracheal deviation present. No thyromegaly present.  Cardiovascular: Normal rate, regular rhythm, S1 normal and S2 normal.   Pulmonary/Chest: Effort normal and breath sounds normal. No respiratory distress. Tina Mercado has no decreased breath sounds. Tina Mercado has no wheezes. Tina Mercado has no rhonchi. Tina Mercado has no rales.  Abdominal: Soft. Bowel sounds are normal. Tina Mercado exhibits no distension. There is no hepatosplenomegaly. There is no tenderness.  Musculoskeletal: Normal range of motion.  Lymphadenopathy:       Head (right side): No submental, no submandibular, no tonsillar, no preauricular and no posterior auricular adenopathy present.       Head (left side): No submental, no submandibular, no tonsillar, no preauricular and no posterior auricular adenopathy present.    Tina Mercado has no cervical adenopathy.       Right cervical: No posterior cervical adenopathy present.      Left cervical: No posterior cervical adenopathy present.       Right: No  supraclavicular adenopathy present.       Left: No supraclavicular adenopathy present.  Neurological: Tina Mercado is alert and oriented to person, place, and time.  Skin: Skin is warm and dry.  Psychiatric: Tina Mercado has a normal mood and affect. Her behavior is normal.      Assessment & Plan:  Advised pt to continue use of Claritin for the next two weeks before ceasing.  Will also prescribe Nexium. Advised pt to use thirty minutes prior to dinner. If symptoms improve, use for 2-3 months.  Plan to refer pt to an ENT if symptoms have not resolved within  a month. The pt will research which ENT provider Tina Mercado prefers and follow-up for the referral.  Will provide pt with Prevnar vaccination today.  Reflux  Hoarseness of voice  Meds ordered this encounter  Medications  . esomeprazole (NEXIUM) 40 MG capsule    Sig: Take 1 capsule (40 mg total) by mouth daily.    Dispense:  30 capsule    Refill:  3    I personally performed the services described in this documentation, which was scribed in my presence. The recorded information has been reviewed and considered, and addended by me as needed.  Delman Cheadle, MD MPH

## 2014-07-06 NOTE — Patient Instructions (Addendum)
Call and leave a message with how you are doing towards the end of the month.  Start the nexium.  In 1 week stop the claritin - if your hoarseness comes back or gets worse, then we will suspect that it was due to post-nasal drip and focus more on treating this.  If the hoarseness goes away in about 2-3 weeks then we will assume it was due to laryngeal reflux and have you continue the nexium for several more months.  If at any point, the hoarseness comes back after stopping the nexium, then you need to restart it.  If you are still having any hoarseness in 3 weeks or so, we should go ahead and refer you to ENT so they can look at your vocal cords.  Hoarseness Hoarseness is produced from a variety of causes. It is important to find the cause so it can be treated. In the absence of a cold or upper respiratory illness, any hoarseness lasting more than 2 weeks should be looked at by a specialist. This is especially important if you have a history of smoking or alcohol use. It is also important to keep in mind that as you grow older, your voice will naturally get weaker, making it easier for you to become hoarse from straining your vocal cords.  CAUSES  Any illness that affects your vocal cords can result in a hoarse voice. Examples of conditions that can affect the vocal cords are listed as follows:   Allergies.  Colds.  Sinusitis.  Gastroesophageal reflux disease.  Croup.  Injury.  Nodules.  Exposure to smoke or toxic fumes or gases.  Congenital and genetic defects.  Paralysis of the vocal cords.  Infections.  Advanced age. DIAGNOSIS  In order to diagnose the cause of your hoarseness, your caregiver will examine your throat using an instrument that uses a tube with a small lighted camera (laryngoscope). It allows your caregiver to look into the mouth and down the throat. TREATMENT  For most cases, treatment will focus on the specific cause of the hoarseness. Depending on the cause,  hoarseness can be a temporary condition (acute) or it can be long lasting (chronic). Most cases of hoarseness clear up without complications. Your caregiver will explain to you if this is not likely to happen. SEEK IMMEDIATE MEDICAL CARE IF:   You have increasing hoarseness or loss of voice.  You have shortness of breath.  You are coughing up blood.  There is pain in your neck or throat. Document Released: 09/26/2005 Document Revised: 01/05/2012 Document Reviewed: 12/19/2010 [facility] Patient Information 2015 [place], [place]. This information is not intended to replace advice given to you by your health care provider. Make sure you discuss any questions you have with your health care provider.  Food Choices for Gastroesophageal Reflux Disease When you have gastroesophageal reflux disease (GERD), the foods you eat and your eating habits are very important. Choosing the right foods can help ease the discomfort of GERD. WHAT GENERAL GUIDELINES DO I NEED TO FOLLOW?  Choose fruits, vegetables, whole grains, low-fat dairy products, and low-fat meat, fish, and poultry.  Limit fats such as oils, salad dressings, butter, nuts, and avocado.  Keep a food diary to identify foods that cause symptoms.  Avoid foods that cause reflux. These may be different for different people.  Eat frequent small meals instead of three large meals each day.  Eat your meals slowly, in a relaxed setting.  Limit fried foods.  Cook foods using methods other than  frying.  Avoid drinking alcohol.  Avoid drinking large amounts of liquids with your meals.  Avoid bending over or lying down until 2-3 hours after eating. WHAT FOODS ARE NOT RECOMMENDED? The following are some foods and drinks that may worsen your symptoms: Vegetables Tomatoes. Tomato juice. Tomato and spaghetti sauce. Chili peppers. Onion and garlic. Horseradish. Fruits Oranges, grapefruit, and lemon (fruit and juice). Meats High-fat meats, fish,  and poultry. This includes hot dogs, ribs, ham, sausage, salami, and bacon. Dairy Whole milk and chocolate milk. Sour cream. Cream. Butter. Ice cream. Cream cheese.  Beverages Coffee and tea, with or without caffeine. Carbonated beverages or energy drinks. Condiments Hot sauce. Barbecue sauce.  Sweets/Desserts Chocolate and cocoa. Donuts. Peppermint and spearmint. Fats and Oils High-fat foods, including [place] fries and potato chips. Other Vinegar. Strong spices, such as black pepper, white pepper, red pepper, cayenne, curry powder, cloves, ginger, and chili powder. The items listed above may not be a complete list of foods and beverages to avoid. Contact your dietitian for more information. Document Released: 10/13/2005 Document Revised: 10/18/2013 Document Reviewed: 08/17/2013 [facility] Patient Information 2015 [place], [place]. This information is not intended to replace advice given to you by your health care provider. Make sure you discuss any questions you have with your health care provider. Pneumococcal Vaccine, Polyvalent suspension for injection What is this medicine? PNEUMOCOCCAL VACCINE, POLYVALENT (NEU mo KOK al vak SEEN, pol ee VEY luhnt) is a vaccine to prevent pneumococcus bacteria infection. These bacteria are a major cause of ear infections, 'Strep throat' infections, and serious pneumonia, meningitis, or blood infections worldwide. These vaccines help the body to produce antibodies (protective substances) that help your body defend against these bacteria. This vaccine is recommended for infants and young children. This vaccine will not treat an infection. This medicine may be used for other purposes; ask your health care provider or pharmacist if you have questions. COMMON BRAND NAME(S): Prevnar 13 What should I tell my health care provider before I take this medicine? They need to know if you have any of these conditions: -bleeding problems -fever -immune system  problems -low platelet count in the blood -seizures -an unusual or allergic reaction to pneumococcal vaccine, diphtheria toxoid, other vaccines, latex, other medicines, foods, dyes, or preservatives -pregnant or trying to get pregnant -breast-feeding How should I use this medicine? This vaccine is for injection into a muscle. It is given by a health care professional. A copy of Vaccine Information Statements will be given before each vaccination. Read this sheet carefully each time. The sheet may change frequently. Talk to your pediatrician regarding the use of this medicine in children. While this drug may be prescribed for children as young as 42 weeks old for selected conditions, precautions do apply. Overdosage: If you think you have taken too much of this medicine contact a poison control center or emergency room at once. NOTE: This medicine is only for you. Do not share this medicine with others. What if I miss a dose? It is important not to miss your dose. Call your doctor or health care professional if you are unable to keep an appointment. What may interact with this medicine? -medicines for cancer chemotherapy -medicines that suppress your immune function -medicines that treat or prevent blood clots like warfarin, enoxaparin, and dalteparin -steroid medicines like prednisone or cortisone This list may not describe all possible interactions. Give your health care provider a list of all the medicines, herbs, non-prescription drugs, or dietary supplements you use. Also tell  them if you smoke, drink alcohol, or use illegal drugs. Some items may interact with your medicine. What should I watch for while using this medicine? Mild fever and pain should go away in 3 days or less. Report any unusual symptoms to your doctor or health care professional. What side effects may I notice from receiving this medicine? Side effects that you should report to your doctor or health care professional as  soon as possible: -allergic reactions like skin rash, itching or hives, swelling of the face, lips, or tongue -breathing problems -confused -fever over 102 degrees F -pain, tingling, numbness in the hands or feet -seizures -unusual bleeding or bruising -unusual muscle weakness Side effects that usually do not require medical attention (report to your doctor or health care professional if they continue or are bothersome): -aches and pains -diarrhea -fever of 102 degrees F or less -headache -irritable -loss of appetite -pain, tender at site where injected -trouble sleeping This list may not describe all possible side effects. Call your doctor for medical advice about side effects. You may report side effects to FDA at 1-800-FDA-1088. Where should I keep my medicine? This does not apply. This vaccine is given in a clinic, pharmacy, doctor's office, or other health care setting and will not be stored at home. NOTE: This sheet is a summary. It may not cover all possible information. If you have questions about this medicine, talk to your doctor, pharmacist, or health care provider.  2015, Elsevier/Gold Standard. (2008-12-26 10:17:22)

## 2014-07-10 ENCOUNTER — Other Ambulatory Visit: Payer: Self-pay | Admitting: Cardiology

## 2014-07-10 ENCOUNTER — Encounter: Payer: Self-pay | Admitting: Family Medicine

## 2014-07-11 NOTE — Telephone Encounter (Signed)
She should obtain from her PCP

## 2014-07-14 ENCOUNTER — Other Ambulatory Visit: Payer: Self-pay | Admitting: Cardiology

## 2014-07-17 NOTE — Addendum Note (Signed)
Addended by: Delman Cheadle on: 07/17/2014 10:56 AM   Modules accepted: Orders

## 2014-08-07 DIAGNOSIS — J370 Chronic laryngitis: Secondary | ICD-10-CM | POA: Diagnosis not present

## 2014-08-11 ENCOUNTER — Telehealth: Payer: Self-pay | Admitting: Cardiology

## 2014-08-11 DIAGNOSIS — M62838 Other muscle spasm: Secondary | ICD-10-CM

## 2014-08-11 NOTE — Telephone Encounter (Signed)
New message  Pt requests a call back to discuss her persistent hoarseness.. She believes per her PCP that it has something to do with her BP medication. Please call back to dicsuss

## 2014-08-11 NOTE — Telephone Encounter (Signed)
Left message to call back  

## 2014-08-14 MED ORDER — LOSARTAN POTASSIUM 25 MG PO TABS: 25.0000 mg | ORAL_TABLET | Freq: Every day | ORAL | Status: DC

## 2014-08-14 NOTE — Telephone Encounter (Signed)
Stop lisinopril and try losartan 25 mg one daily

## 2014-08-14 NOTE — Telephone Encounter (Signed)
Patient requesting refill on her Robaxin since  Dr. Mare Ferrari Rx'd years ago. States does not use it very often Will forward to  Dr. Mare Ferrari for review   Advised patient.

## 2014-08-14 NOTE — Telephone Encounter (Signed)
Patient saw PCP for hoarseness and was treated for allergies and reflux. Neither things Rx'd have helped.  Was referred to ENT, everything checked out ok Per patient ENT concerned could be coming from Lisinopril Will forward to  Dr. Mare Ferrari for recommendations.

## 2014-08-14 NOTE — Telephone Encounter (Signed)
Follow up ° ° ° ° °Returning Tina Mercado's call °

## 2014-08-14 NOTE — Telephone Encounter (Signed)
Okay to refill robaxin 

## 2014-08-16 MED ORDER — METHOCARBAMOL 500 MG PO TABS: 500.0000 mg | ORAL_TABLET | Freq: Four times a day (QID) | ORAL | Status: DC | PRN

## 2014-08-16 NOTE — Telephone Encounter (Signed)
Left message to call back  

## 2014-08-16 NOTE — Telephone Encounter (Signed)
Refilled as requested  

## 2014-08-17 ENCOUNTER — Encounter: Payer: Self-pay | Admitting: *Deleted

## 2014-08-17 DIAGNOSIS — J370 Chronic laryngitis: Secondary | ICD-10-CM | POA: Insufficient documentation

## 2014-08-30 DIAGNOSIS — Z23 Encounter for immunization: Secondary | ICD-10-CM | POA: Diagnosis not present

## 2014-09-27 ENCOUNTER — Encounter: Payer: Self-pay | Admitting: Family Medicine

## 2014-09-27 DIAGNOSIS — T86830 Bone graft rejection: Secondary | ICD-10-CM | POA: Diagnosis not present

## 2014-09-27 DIAGNOSIS — H1851 Endothelial corneal dystrophy: Secondary | ICD-10-CM | POA: Diagnosis not present

## 2014-09-27 DIAGNOSIS — H2512 Age-related nuclear cataract, left eye: Secondary | ICD-10-CM | POA: Diagnosis not present

## 2014-10-06 ENCOUNTER — Telehealth: Payer: Self-pay

## 2014-10-06 NOTE — Telephone Encounter (Signed)
Left message reminding patient to get her flu shot.

## 2014-10-07 ENCOUNTER — Other Ambulatory Visit: Payer: Self-pay | Admitting: Family Medicine

## 2014-10-07 ENCOUNTER — Ambulatory Visit (INDEPENDENT_AMBULATORY_CARE_PROVIDER_SITE_OTHER): Payer: Medicare Other

## 2014-10-07 ENCOUNTER — Ambulatory Visit (INDEPENDENT_AMBULATORY_CARE_PROVIDER_SITE_OTHER): Payer: Medicare Other | Admitting: Family Medicine

## 2014-10-07 VITALS — BP 126/70 | HR 74 | Temp 97.8°F | Resp 16 | Ht 62.0 in | Wt 146.0 lb

## 2014-10-07 DIAGNOSIS — J370 Chronic laryngitis: Secondary | ICD-10-CM | POA: Diagnosis not present

## 2014-10-07 DIAGNOSIS — M810 Age-related osteoporosis without current pathological fracture: Secondary | ICD-10-CM | POA: Diagnosis not present

## 2014-10-07 DIAGNOSIS — R0781 Pleurodynia: Secondary | ICD-10-CM | POA: Diagnosis not present

## 2014-10-07 DIAGNOSIS — M25551 Pain in right hip: Secondary | ICD-10-CM | POA: Diagnosis not present

## 2014-10-07 DIAGNOSIS — W108XXA Fall (on) (from) other stairs and steps, initial encounter: Secondary | ICD-10-CM

## 2014-10-07 DIAGNOSIS — T700XXS Otitic barotrauma, sequela: Secondary | ICD-10-CM

## 2014-10-07 DIAGNOSIS — I119 Hypertensive heart disease without heart failure: Secondary | ICD-10-CM | POA: Diagnosis not present

## 2014-10-07 DIAGNOSIS — Z889 Allergy status to unspecified drugs, medicaments and biological substances status: Secondary | ICD-10-CM | POA: Diagnosis not present

## 2014-10-07 DIAGNOSIS — R0789 Other chest pain: Secondary | ICD-10-CM

## 2014-10-07 MED ORDER — HYDROCODONE-ACETAMINOPHEN 5-325 MG PO TABS: 1.0000 | ORAL_TABLET | Freq: Four times a day (QID) | ORAL | Status: DC | PRN

## 2014-10-07 MED ORDER — FLUTICASONE PROPIONATE 50 MCG/ACT NA SUSP: 2.0000 | Freq: Every day | NASAL | Status: DC

## 2014-10-07 NOTE — Progress Notes (Addendum)
Subjective:    Patient ID: Tina Mercado, female    DOB: [birth year], 80 y.o.   MRN: [id number] This chart was scribed for Delman Cheadle, MD by Zola Button, Medical Scribe. This patient was seen in Room 1 and the patient's care was started at 1:35 PM.  Chief Complaint  Patient presents with  . Fall    Wednesday afternoon  . Right Rib Pain  . Groin Pain    Right  . Feels Unsteady    Left ear feels stopped up x couple of months    HPI HPI Comments: Tina Mercado is a 80 y.o. female with a hx of osteoporosis who presents to the [facility] complaining of a fall onto concrete that occurred a few days ago while carrying two bags of items from her car to a friend's car. Patient reports having right rib pain and right hip pain. She is able to ambulate. She has taken a few ibuprofen for pain, 4-5 times in the past 3 days as well as Tylenol one time; she states her pain is not bad when staying still. The pain is worsened by quick breathing, sneezing, coughing and hiccupping. Patient denies head injury or face injury. She also denies dizziness.  She had been having persistent hoarseness. Failed her trial of Nexium. ENT suspected it was due to her lisinopril, so that was stopped. She was continued on HCTZ 6.25 only for BP. She has a follow-up cardiology evaluation this week.  Her hoarseness has not improved since. Patient also notes having had some problems with congestion for a while. She has not been using any nasal sprays. Patient denies problems with nosebleeds. She does not have a humidifier in her room. She denies urinary problems, lightheadedness and dizziness on the medication; however, she has noticed some left ear pressure.  Past Medical History  Diagnosis Date  . Hypertension   . Mitral regurgitation     mild tomoderate  . History of anemia   . History of diastolic dysfunction   . Arrhythmia   . Heart murmur   . Hyperlipidemia   . Reflux   . History of  seasonal allergies   . Allergy   . Cataract   . Glaucoma   . Osteoporosis    Current Outpatient Prescriptions on File Prior to Visit  Medication Sig Dispense Refill  . aspirin EC 81 MG tablet Take 81 mg by mouth daily.      . bromfenac (XIBROM) 0.09 % ophthalmic solution Place 1 drop into the right eye daily.     . cholecalciferol (VITAMIN D) 1000 UNITS tablet Take 2,000 Units by mouth daily.     . CRESTOR 10 MG tablet TAKE 1 TABLET DAILY 30 tablet 6  . Docusate Calcium (STOOL SOFTENER PO) Take 1 capsule by mouth as needed (stool softener.).     Marland Kitchen hydrochlorothiazide (HYDRODIURIL) 12.5 MG tablet Take 1 tablet (12.5 mg total) by mouth daily. (Patient taking differently: Take 6.25-12.5 mg by mouth daily. ) 90 tablet 3  . ibuprofen (ADVIL,MOTRIN) 200 MG tablet Take 200 mg by mouth as needed for pain (for arthritis pain).    . methocarbamol (ROBAXIN) 500 MG tablet Take 1 tablet (500 mg total) by mouth every 6 (six) hours as needed for muscle spasms. 50 tablet 0  . Multiple Vitamin (MULTIVITAMIN) tablet Take 1 tablet by mouth daily.      . naproxen sodium (ANAPROX) 220 MG tablet Take 220 mg by mouth 2 (two) times  daily as needed (pain.).     Marland Kitchen timolol (BETIMOL) 0.5 % ophthalmic solution Place 1 drop into both eyes 2 (two) times daily.    Marland Kitchen losartan (COZAAR) 25 MG tablet Take 1 tablet (25 mg total) by mouth daily. (Patient not taking: Reported on 10/07/2014) 30 tablet 5   No current facility-administered medications on file prior to visit.   Allergies  Allergen Reactions  . Celebrex [Celecoxib]     edema  . Darvocet [Propoxyphene N-Acetaminophen]     nausea  . Demerol     nausea  . Feldene [Piroxicam]     edema  . Lisinopril     Hoarseness     Review of Systems  HENT: Positive for congestion, ear pain and voice change. Negative for nosebleeds.   Cardiovascular: Positive for chest pain.  Genitourinary: Negative for dysuria, urgency, frequency, hematuria, decreased urine volume and  difficulty urinating.  Musculoskeletal: Positive for myalgias and arthralgias.  Neurological: Negative for dizziness and light-headedness.       Objective:  BP 126/70 mmHg  Pulse 74  Temp(Src) 97.8 F (36.6 C) (Oral)  Resp 16  Ht 5\' 2"  (1.575 m)  Wt 146 lb (66.225 kg)  BMI 26.70 kg/m2  SpO2 98%  Physical Exam  Constitutional: She is oriented to person, place, and time. She appears well-developed and well-nourished. No distress.  HENT:  Head: Normocephalic and atraumatic.  Nose: Mucosal edema present.  Mouth/Throat: Oropharynx is clear and moist. No oropharyngeal exudate.  Nasal mucosal edema, left worse than right.  Eyes: Pupils are equal, round, and reactive to light.  Neck: Neck supple.  Cardiovascular: Normal rate.   Pulmonary/Chest: Effort normal and breath sounds normal. No respiratory distress. She has no wheezes. She has no rales.  Good air movement throughout.  Musculoskeletal: She exhibits tenderness. She exhibits no edema.  Right leg: great flexion and internal rotation, but increased pain with internal rotation. External rotation moderately reduced. No tenderness over the head of the greater trochanter.   Left leg: good flexion and external rotation, but decreased internal rotation.   Neurological: She is alert and oriented to person, place, and time. No cranial nerve deficit.  Skin: Skin is warm and dry. No rash noted.  No rash or bruising.  Psychiatric: She has a normal mood and affect. Her behavior is normal.  Nursing note and vitals reviewed.  [facility] reading (PRIMARY) by  Dr. Brigitte Pulse. Right ribs films with PA chest: no acute rib fracture seen Right hip xray: no acute abnormality seen.    Dg Ribs Unilateral W/chest Right  10/07/2014   CLINICAL DATA:  [place] 3 days ago. Injured the right ribs. Complains of right flank pain. No previous injury.  EXAM: RIGHT RIBS AND CHEST - 3+ VIEW  COMPARISON:  10/29/2012  FINDINGS: Lungs are hyperinflated. Heart size is normal.  Moderate hiatal hernia is present. There is minimal left lower lobe atelectasis. No pneumothorax identified. Oblique views demonstrate no acute displaced rib fractures.  IMPRESSION: 1. Hyperinflation. 2. Hiatal hernia. 3.  No evidence for acute  abnormality.   Electronically Signed   By: Shon Hale M.D.   On: 10/07/2014 15:04   Dg Hip Complete Right  10/07/2014   CLINICAL DATA:  80 year old female status post fall 3 days ago with right hip and groin pain  EXAM: RIGHT HIP - COMPLETE 2+ VIEW  COMPARISON:  Prior CT abdomen/ pelvis 11/23/2004  FINDINGS: No evidence of acute fracture or malalignment. Bilateral hip joint osteoarthritis slightly worse on the left  than the right. Bones are mildly osteopenic. Unremarkable bowel gas pattern.  IMPRESSION: 1. No acute fracture or malalignment. 2. Mild right and moderate left hip joint osteoarthritis. 3. Osteopenia.   Electronically Signed   By: Jacqulynn Cadet M.D.   On: 10/07/2014 15:04    Assessment & Plan:   Barotitis media, sequela  - try nasal saline, humidifier, and steam treatments  Fall down steps, initial encounter - Plan: DG Ribs Unilateral W/Chest Right, CANCELED: DG Tibia/Fibula Right  Rib pain on right side - Plan: DG Ribs Unilateral W/Chest Right - rib contusion - ok to calll for pain medicine if sxs are not improving. Reviewed need for deep breathing and use flutter valve.  Right hip pain - Plan: DG Ribs Unilateral W/Chest Right  Osteoporosis  History of seasonal allergies  Benign hypertensive heart disease without heart failure  Chronic laryngitis  Meds ordered this encounter  Medications  . prednisoLONE acetate (OMNIPRED) 1 % ophthalmic suspension    Sig: 1 drop 4 (four) times daily.    I personally performed the services described in this documentation, which was scribed in my presence. The recorded information has been reviewed and considered, and addended by me as needed.  Delman Cheadle, MD MPH

## 2014-10-07 NOTE — Patient Instructions (Signed)
Barotitis Media Barotitis media is inflammation of your middle ear. This occurs when the auditory tube (eustachian tube) leading from the back of your nose (nasopharynx) to your eardrum is blocked. This blockage may result from a cold, environmental allergies, or an upper respiratory infection. Unresolved barotitis media may lead to damage or hearing loss (barotrauma), which may become permanent. HOME CARE INSTRUCTIONS   Use medicines as recommended by your health care provider. Over-the-counter medicines will help unblock the canal and can help during times of air travel.  Do not put anything into your ears to clean or unplug them. Eardrops will not be helpful.  Do not swim, dive, or fly until your health care provider says it is all right to do so. If these activities are necessary, chewing gum with frequent, forceful swallowing may help. It is also helpful to hold your nose and gently blow to pop your ears for equalizing pressure changes. This forces air into the eustachian tube.  Only take over-the-counter or prescription medicines for pain, discomfort, or fever as directed by your health care provider.  A decongestant may be helpful in decongesting the middle ear and make pressure equalization easier. SEEK MEDICAL CARE IF:  You experience a serious form of dizziness in which you feel as if the room is spinning and you feel nauseated (vertigo).  Your symptoms only involve one ear. SEEK IMMEDIATE MEDICAL CARE IF:   You develop a severe headache, dizziness, or severe ear pain.  You have bloody or pus-like drainage from your ears.  You develop a fever.  Your problems do not improve or become worse. MAKE SURE YOU:   Understand these instructions.  Will watch your condition.  Will get help right away if you are not doing well or get worse. Document Released: 10/10/2000 Document Revised: 08/03/2013 Document Reviewed: 05/10/2013 [facility] Patient Information 2015 [place], [place]. This  information is not intended to replace advice given to you by your health care provider. Make sure you discuss any questions you have with your health care provider.   Rib Contusion A rib contusion (bruise) can occur by a blow to the chest or by a fall against a hard object. Usually these will be much better in a couple weeks. If X-rays were taken today and there are no broken bones (fractures), the diagnosis of bruising is made. However, broken ribs may not show up for several days, or may be discovered later on a routine X-ray when signs of healing show up. If this happens to you, it does not mean that something was missed on the X-ray, but simply that it did not show up on the first X-rays. Earlier diagnosis will not usually change the treatment. HOME CARE INSTRUCTIONS   Avoid strenuous activity. Be careful during activities and avoid bumping the injured ribs. Activities that pull on the injured ribs and cause pain should be avoided, if possible.  For the first day or two, an ice pack used every 20 minutes while awake may be helpful. Put ice in a plastic bag and put a towel between the bag and the skin.  Eat a normal, well-balanced diet. Drink plenty of fluids to avoid constipation.  Take deep breaths several times a day to keep lungs free of infection. Try to cough several times a day. Splint the injured area with a pillow while coughing to ease pain. Coughing can help prevent pneumonia.  Wear a rib belt or binder only if told to do so by your caregiver. If you are  wearing a rib belt or binder, you must do the breathing exercises as directed by your caregiver. If not used properly, rib belts or binders restrict breathing which can lead to pneumonia.  Only take over-the-counter or prescription medicines for pain, discomfort, or fever as directed by your caregiver. SEEK MEDICAL CARE IF:   You or your child has an oral temperature above 102 F (38.9 C).  Your baby is older than 3 months with  a rectal temperature of 100.5 F (38.1 C) or higher for more than 1 day.  You develop a cough, with thick or bloody sputum. SEEK IMMEDIATE MEDICAL CARE IF:   You have difficulty breathing.  You feel sick to your stomach (nausea), have vomiting or belly (abdominal) pain.  You have worsening pain, not controlled with medications, or there is a change in the location of the pain.  You develop sweating or radiation of the pain into the arms, jaw or shoulders, or become light headed or faint.  You or your child has an oral temperature above 102 F (38.9 C), not controlled by medicine.  Your or your baby is older than 3 months with a rectal temperature of 102 F (38.9 C) or higher.  Your baby is 30 months old or younger with a rectal temperature of 100.4 F (38 C) or higher. MAKE SURE YOU:   Understand these instructions.  Will watch your condition.  Will get help right away if you are not doing well or get worse. Document Released: 07/08/2001 Document Revised: 02/07/2013 Document Reviewed: 05/31/2008 [facility] Patient Information 2015 [place], [place]. This information is not intended to replace advice given to you by your health care provider. Make sure you discuss any questions you have with your health care provider.  Hip Pain Your hip is the joint between your upper legs and your lower pelvis. The bones, cartilage, tendons, and muscles of your hip joint perform a lot of work each day supporting your body weight and allowing you to move around. Hip pain can range from a minor ache to severe pain in one or both of your hips. Pain may be felt on the inside of the hip joint near the groin, or the outside near the buttocks and upper thigh. You may have swelling or stiffness as well.  HOME CARE INSTRUCTIONS   Take medicines only as directed by your health care provider.  Apply ice to the injured area:  Put ice in a plastic bag.  Place a towel between your skin and the bag.  Leave  the ice on for 15-20 minutes at a time, 3-4 times a day.  Keep your leg raised (elevated) when possible to lessen swelling.  Avoid activities that cause pain.  Follow specific exercises as directed by your health care provider.  Sleep with a pillow between your legs on your most comfortable side.  Record how often you have hip pain, the location of the pain, and what it feels like. SEEK MEDICAL CARE IF:   You are unable to put weight on your leg.  Your hip is red or swollen or very tender to touch.  Your pain or swelling continues or worsens after 1 week.  You have increasing difficulty walking.  You have a fever. SEEK IMMEDIATE MEDICAL CARE IF:   You have fallen.  You have a sudden increase in pain and swelling in your hip. MAKE SURE YOU:   Understand these instructions.  Will watch your condition.  Will get help right away if you  are not doing well or get worse. Document Released: 04/02/2010 Document Revised: 02/27/2014 Document Reviewed: 06/09/2013 [facility] Patient Information 2015 [place], [place]. This information is not intended to replace advice given to you by your health care provider. Make sure you discuss any questions you have with your health care provider. Hip Exercises RANGE OF MOTION (ROM) AND STRETCHING EXERCISES  These exercises may help you when beginning to rehabilitate your injury. Doing them too aggressively can worsen your condition. Complete them slowly and gently. Your symptoms may resolve with or without further involvement from your physician, physical therapist or athletic trainer. While completing these exercises, remember:   Restoring tissue flexibility helps normal motion to return to the joints. This allows healthier, less painful movement and activity.  An effective stretch should be held for at least 30 seconds.  A stretch should never be painful. You should only feel a gentle lengthening or release in the stretched tissue. If these  stretches worsen your symptoms even when done gently, consult your physician, physical therapist or athletic trainer. STRETCH - Hamstrings, Supine   Lie on your back. Loop a belt or towel over the ball of your right / left foot.  Straighten your right / left knee and slowly pull on the belt to raise your leg. Do not allow the right / left knee to bend. Keep your opposite leg flat on the floor.  Raise the leg until you feel a gentle stretch behind your right / left knee or thigh. Hold this position for __________ seconds. Repeat __________ times. Complete this stretch __________ times per day.  STRETCH - Hip Rotators   Lie on your back on a firm surface. Grasp your right / left knee with your right / left hand and your ankle with your opposite hand.  Keeping your hips and shoulders firmly planted, gently pull your right / left knee and rotate your lower leg toward your opposite shoulder until you feel a stretch in your buttocks.  Hold this stretch for __________ seconds. Repeat this stretch __________ times. Complete this stretch __________ times per day. STRETCH - Hamstrings/Adductors, V-Sit   Sit on the floor with your legs extended in a large "V," keeping your knees straight.  With your head and chest upright, bend at your waist reaching for your right foot to stretch your left adductors.  You should feel a stretch in your left inner thigh. Hold for __________ seconds.  Return to the upright position to relax your leg muscles.  Continuing to keep your chest upright, bend straight forward at your waist to stretch your hamstrings.  You should feel a stretch behind both of your thighs and/or knees. Hold for __________ seconds.  Return to the upright position to relax your leg muscles.  Repeat steps 2 through 4 for opposite leg. Repeat __________ times. Complete this exercise __________ times per day.  STRETCHING - Hip Flexors, Lunge  Half kneel with your right / left knee on the  floor and your opposite knee bent and directly over your ankle.  Keep good posture with your head over your shoulders. Tighten your buttocks to point your tailbone downward; this will prevent your back from arching too much.  You should feel a gentle stretch in the front of your thigh and/or hip. If you do not feel any resistance, slightly slide your opposite foot forward and then slowly lunge forward so your knee once again lines up over your ankle. Be sure your tailbone remains pointed downward.  Hold this stretch  for __________ seconds. Repeat __________ times. Complete this stretch __________ times per day. STRENGTHENING EXERCISES These exercises may help you when beginning to rehabilitate your injury. They may resolve your symptoms with or without further involvement from your physician, physical therapist or athletic trainer. While completing these exercises, remember:   Muscles can gain both the endurance and the strength needed for everyday activities through controlled exercises.  Complete these exercises as instructed by your physician, physical therapist or athletic trainer. Progress the resistance and repetitions only as guided.  You may experience muscle soreness or fatigue, but the pain or discomfort you are trying to eliminate should never worsen during these exercises. If this pain does worsen, stop and make certain you are following the directions exactly. If the pain is still present after adjustments, discontinue the exercise until you can discuss the trouble with your clinician. STRENGTH - Hip Extensors, Bridge   Lie on your back on a firm surface. Bend your knees and place your feet flat on the floor.  Tighten your buttocks muscles and lift your bottom off the floor until your trunk is level with your thighs. You should feel the muscles in your buttocks and back of your thighs working. If you do not feel these muscles, slide your feet 1-2 inches further away from your  buttocks.  Hold this position for __________ seconds.  Slowly lower your hips to the starting position and allow your buttock muscles relax completely before beginning the next repetition.  If this exercise is too easy, you may cross your arms over your chest. Repeat __________ times. Complete this exercise __________ times per day.  STRENGTH - Hip Abductors, Straight Leg Raises  Be aware of your form throughout the entire exercise so that you exercise the correct muscles. Sloppy form means that you are not strengthening the correct muscles.  Lie on your side so that your head, shoulders, knee and hip line up. You may bend your lower knee to help maintain your balance. Your right / left leg should be on top.  Roll your hips slightly forward, so that your hips are stacked directly over each other and your right / left knee is facing forward.  Lift your top leg up 4-6 inches, leading with your heel. Be sure that your foot does not drift forward or that your knee does not roll toward the ceiling.  Hold this position for __________ seconds. You should feel the muscles in your outer hip lifting (you may not notice this until your leg begins to tire).  Slowly lower your leg to the starting position. Allow the muscles to fully relax before beginning the next repetition. Repeat __________ times. Complete this exercise __________ times per day.  STRENGTH - Hip Adductors, Straight Leg Raises   Lie on your side so that your head, shoulders, knee and hip line up. You may place your upper foot in front to help maintain your balance. Your right / left leg should be on the bottom.  Roll your hips slightly forward, so that your hips are stacked directly over each other and your right / left knee is facing forward.  Tense the muscles in your inner thigh and lift your bottom leg 4-6 inches. Hold this position for __________ seconds.  Slowly lower your leg to the starting position. Allow the muscles to  fully relax before beginning the next repetition. Repeat __________ times. Complete this exercise __________ times per day.  STRENGTH - Quadriceps, Straight Leg Raises  Quality counts! Watch for  signs that the quadriceps muscle is working to insure you are strengthening the correct muscles and not "cheating" by substituting with healthier muscles.  Lay on your back with your right / left leg extended and your opposite knee bent.  Tense the muscles in the front of your right / left thigh. You should see either your knee cap slide up or increased dimpling just above the knee. Your thigh may even quiver.  Tighten these muscles even more and raise your leg 4 to 6 inches off the floor. Hold for right / left seconds.  Keeping these muscles tense, lower your leg.  Relax the muscles slowly and completely in between each repetition. Repeat __________ times. Complete this exercise __________ times per day.  STRENGTH - Hip Abductors, Standing  Tie one end of a rubber exercise band/tubing to a secure surface (table, pole) and tie a loop at the other end.  Place the loop around your right / left ankle. Keeping your ankle with the band directly opposite of the secured end, step away until there is tension in the tube/band.  Hold onto a chair as needed for balance.  Keeping your back upright, your shoulders over your hips, and your toes pointing forward, lift your right / left leg out to your side. Be sure to lift your leg with your hip muscles. Do not "throw" your leg or tip your body to lift your leg.  Slowly and with control, return to the starting position. Repeat exercise __________ times. Complete this exercise __________ times per day.  STRENGTH - Quadriceps, Squats  Stand in a door frame so that your feet and knees are in line with the frame.  Use your hands for balance, not support, on the frame.  Slowly lower your weight, bending at the hips and knees. Keep your lower legs upright so that  they are parallel with the door frame. Squat only within the range that does not increase your knee pain. Never let your hips drop below your knees.  Slowly return upright, pushing with your legs, not pulling with your hands. Document Released: 10/31/2005 Document Revised: 01/05/2012 Document Reviewed: 01/25/2009 [facility] Patient Information 2015 [place], [place]. This information is not intended to replace advice given to you by your health care provider. Make sure you discuss any questions you have with your health care provider.

## 2014-10-09 ENCOUNTER — Ambulatory Visit (INDEPENDENT_AMBULATORY_CARE_PROVIDER_SITE_OTHER): Payer: Medicare Other | Admitting: Cardiology

## 2014-10-09 ENCOUNTER — Other Ambulatory Visit: Payer: Medicare Other | Admitting: *Deleted

## 2014-10-09 VITALS — BP 150/80 | HR 70 | Ht 62.0 in | Wt 145.0 lb

## 2014-10-09 DIAGNOSIS — E7800 Pure hypercholesterolemia, unspecified: Secondary | ICD-10-CM

## 2014-10-09 DIAGNOSIS — I340 Nonrheumatic mitral (valve) insufficiency: Secondary | ICD-10-CM

## 2014-10-09 DIAGNOSIS — I119 Hypertensive heart disease without heart failure: Secondary | ICD-10-CM | POA: Diagnosis not present

## 2014-10-09 DIAGNOSIS — I493 Ventricular premature depolarization: Secondary | ICD-10-CM | POA: Diagnosis not present

## 2014-10-09 LAB — LIPID PANEL
CHOL/HDL RATIO: 3
Cholesterol: 153 mg/dL (ref 0–200)
HDL: 47.9 mg/dL (ref >39.00)
LDL CALC: 78 mg/dL (ref 0–99)
NonHDL: 105.1
TRIGLYCERIDES: 136 mg/dL (ref 0.0–149.0)
VLDL: 27.2 mg/dL (ref 0.0–40.0)

## 2014-10-09 LAB — HEPATIC FUNCTION PANEL
ALT: 12 U/L (ref 0–35)
AST: 16 U/L (ref 0–37)
Albumin: 3.8 g/dL (ref 3.5–5.2)
Alkaline Phosphatase: 72 U/L (ref 39–117)
BILIRUBIN DIRECT: 0.1 mg/dL (ref 0.0–0.3)
BILIRUBIN TOTAL: 0.6 mg/dL (ref 0.2–1.2)
TOTAL PROTEIN: 7 g/dL (ref 6.0–8.3)

## 2014-10-09 LAB — BASIC METABOLIC PANEL
BUN: 17 mg/dL (ref 6–23)
CO2: 24 mEq/L (ref 19–32)
Calcium: 9.3 mg/dL (ref 8.4–10.5)
Chloride: 107 mEq/L (ref 96–112)
Creatinine, Ser: 1 mg/dL (ref 0.4–1.2)
GFR: 57.49 mL/min — AB (ref >60.00)
Glucose, Bld: 100 mg/dL — ABNORMAL HIGH (ref 70–99)
Potassium: 4.4 mEq/L (ref 3.5–5.1)
SODIUM: 139 meq/L (ref 135–145)

## 2014-10-09 NOTE — Assessment & Plan Note (Signed)
She has not been aware of any recent PVCs or palpitations.

## 2014-10-09 NOTE — Assessment & Plan Note (Signed)
She is not having any symptoms of heart failure from her mild mitral regurgitation.

## 2014-10-09 NOTE — Assessment & Plan Note (Signed)
Blood pressures have been more stable.  Continue same medication

## 2014-10-09 NOTE — Progress Notes (Signed)
Tina Mercado Date of Birth:  06-Jan-1929 [place] [address] [place], [place]  [address] [phone]        Fax   [phone]   History of Present Illness: This pleasant 80 year old, Caucasian female is seen for a four-month followup office visit. She has been widowed since June of 2010. The patient has a history of essential hypertension. She also has a history of mitral valve disease with atrial arrhythmias. She has a history of essential hypertension with diastolic dysfunction.  Since last visit she has stopped her ACE inhibitor and is on just a small dose of daily HCTZ.  She takes either 6.25 mg or 12.5 mg pending on her blood pressure.  She brought in a list of her blood pressures today which are excellent and are in a normal range with very little variation from day-to-day  Current Outpatient Prescriptions  Medication Sig Dispense Refill  . aspirin EC 81 MG tablet Take 81 mg by mouth daily.      . bromfenac (XIBROM) 0.09 % ophthalmic solution Place 1 drop into the right eye daily.     . cholecalciferol (VITAMIN D) 1000 UNITS tablet Take 2,000 Units by mouth daily.     . CRESTOR 10 MG tablet TAKE 1 TABLET DAILY 30 tablet 6  . Docusate Calcium (STOOL SOFTENER PO) Take 1 capsule by mouth as needed (stool softener.).     Marland Kitchen fluticasone (FLONASE) 50 MCG/ACT nasal spray Place 2 sprays into both nostrils at bedtime. 16 g 2  . hydrochlorothiazide (HYDRODIURIL) 12.5 MG tablet Take 1 tablet (12.5 mg total) by mouth daily. (Patient taking differently: Take 6.25-12.5 mg by mouth daily. Take 6.25 daily.) 90 tablet 3  . HYDROcodone-acetaminophen (NORCO/VICODIN) 5-325 MG per tablet Take 1 tablet by mouth every 6 (six) hours as needed for moderate pain. 30 tablet 0  . ibuprofen (ADVIL,MOTRIN) 200 MG tablet Take 200 mg by mouth as needed for pain (for arthritis pain).    . methocarbamol (ROBAXIN) 500 MG tablet Take 1 tablet (500 mg total) by mouth every 6 (six) hours as  needed for muscle spasms. 50 tablet 0  . Multiple Vitamin (MULTIVITAMIN) tablet Take 1 tablet by mouth daily.      . naproxen sodium (ANAPROX) 220 MG tablet Take 220 mg by mouth 2 (two) times daily as needed (pain.).     Marland Kitchen prednisoLONE acetate (OMNIPRED) 1 % ophthalmic suspension 1 drop 4 (four) times daily.    . timolol (BETIMOL) 0.5 % ophthalmic solution Place 1 drop into both eyes 2 (two) times daily.     No current facility-administered medications for this visit.    Allergies  Allergen Reactions  . Celebrex [Celecoxib]     edema  . Darvocet [Propoxyphene N-Acetaminophen]     nausea  . Demerol     nausea  . Feldene [Piroxicam]     edema  . Lisinopril     Hoarseness     Patient Active Problem List   Diagnosis Date Noted  . Chronic laryngitis 08/17/2014  . Blindness and low vision 04/13/2013  . Osteoporosis 12/23/2012  . 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 Bronchitis 10/30/2011  . Benign hypertensive heart disease without heart failure 08/05/2011  . Pure hypercholesterolemia 08/05/2011  . Mitral regurgitation   . History of anemia   . History of diastolic dysfunction   . Reflux   . History of seasonal allergies     History  Smoking status  . Never Smoker  Smokeless tobacco  . Never Used    History  Alcohol Use No    Family History  Problem Relation Age of Onset  . Heart disease Mother   . Emphysema Father   . Emphysema Brother   . COPD Brother     Review of Systems: Constitutional: no fever chills diaphoresis or fatigue or change in weight.  Head and neck: no hearing loss, no epistaxis, no photophobia or visual disturbance. Respiratory: No cough, shortness of breath or wheezing. Cardiovascular: No chest pain peripheral edema, palpitations. Gastrointestinal: No abdominal distention, no abdominal pain, no change in bowel habits hematochezia or melena. Genitourinary: No dysuria, no frequency, no urgency, no nocturia. Musculoskeletal:No arthralgias, no  back pain, no gait disturbance or myalgias. Neurological: No dizziness, no headaches, no numbness, no seizures, no syncope, no weakness, no tremors. Hematologic: No lymphadenopathy, no easy bruising. Psychiatric: No confusion, no hallucinations, no sleep disturbance.    Physical Exam: Filed Vitals:   10/09/14 1041  BP: 150/80  Pulse: 70   the general appearance reveals that about well-developed well-nourished pleasant elderly woman in no distress.The head and neck exam reveals pupils equal and reactive.  Extraocular movements are full.  There is no scleral icterus.  The mouth and pharynx are normal.  The neck is supple.  The carotids reveal no bruits.  The jugular venous pressure is normal.  The  thyroid is not enlarged.  There is no lymphadenopathy.  The chest is clear to percussion and auscultation.  There are no rales or rhonchi.  Expansion of the chest is symmetrical.  The precordium is quiet.  The first heart sound is normal.  The second heart sound is physiologically split.  There is grade 1/6 apical systolic murmur.  There is no abnormal lift or heave.  The abdomen is soft and nontender.  The bowel sounds are normal.  The liver and spleen are not enlarged.  There are no abdominal masses.  There are no abdominal bruits.  Extremities reveal good pedal pulses.  There is no phlebitis or edema.  There is no cyanosis or clubbing.  Strength is normal and symmetrical in all extremities.  There is no lateralizing weakness.  There are no sensory deficits.  The skin is warm and dry.  There is no rash.     Assessment / Plan: 1. essential hypertension, improved on low dose daily diuretic 2. mild mitral regurgitation 3. hypercholesterolemia on Crestor 4. 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  Continue current medication.  Recheck in 4 months for office visit lipid panel hepatic function panel and basal metabolic panel

## 2014-10-09 NOTE — Patient Instructions (Signed)
Will obtain labs today and call you with the results (LP/BMET/HFP)  Your physician recommends that you continue on your current medications as directed. Please refer to the Current Medication list given to you today.  Your physician wants you to follow-up in: 4 months with fasting labs (lp/bmet/hfp)  You will receive a reminder letter in the mail two months in advance. If you don't receive a letter, please call our office to schedule the follow-up appointment.

## 2014-10-09 NOTE — Progress Notes (Signed)
Quick Note:  Please report to patient. The recent labs are stable. Continue same medication and careful diet. ______ 

## 2014-10-23 ENCOUNTER — Other Ambulatory Visit: Payer: Self-pay | Admitting: Family Medicine

## 2014-11-21 ENCOUNTER — Other Ambulatory Visit: Payer: Self-pay | Admitting: Cardiology

## 2014-12-13 DIAGNOSIS — T86830 Bone graft rejection: Secondary | ICD-10-CM | POA: Diagnosis not present

## 2014-12-13 DIAGNOSIS — H2512 Age-related nuclear cataract, left eye: Secondary | ICD-10-CM | POA: Diagnosis not present

## 2014-12-13 DIAGNOSIS — H1851 Endothelial corneal dystrophy: Secondary | ICD-10-CM | POA: Diagnosis not present

## 2015-02-01 ENCOUNTER — Other Ambulatory Visit: Payer: Self-pay | Admitting: Cardiology

## 2015-02-01 NOTE — Telephone Encounter (Signed)
Darlin Coco, MD at 10/09/2014 11:00 AM  CRESTOR 10 MG tablet TAKE 1 TABLET DAILY 30 tablet 6 Your physician recommends that you continue on your current medications as directed. Please refer to the Current Medication list given to you today  Notes Recorded by Darlin Coco, MD on 10/09/2014 at 9:57 PM Please report to patient. The recent labs are stable. Continue same medication and careful diet.

## 2015-02-28 ENCOUNTER — Encounter: Payer: Self-pay | Admitting: Cardiology

## 2015-02-28 ENCOUNTER — Other Ambulatory Visit (INDEPENDENT_AMBULATORY_CARE_PROVIDER_SITE_OTHER): Payer: Medicare Other | Admitting: *Deleted

## 2015-02-28 ENCOUNTER — Ambulatory Visit (INDEPENDENT_AMBULATORY_CARE_PROVIDER_SITE_OTHER): Payer: Medicare Other | Admitting: Cardiology

## 2015-02-28 VITALS — BP 154/60 | HR 71 | Ht 62.5 in | Wt 146.4 lb

## 2015-02-28 DIAGNOSIS — I340 Nonrheumatic mitral (valve) insufficiency: Secondary | ICD-10-CM | POA: Diagnosis not present

## 2015-02-28 DIAGNOSIS — E7800 Pure hypercholesterolemia, unspecified: Secondary | ICD-10-CM

## 2015-02-28 DIAGNOSIS — I119 Hypertensive heart disease without heart failure: Secondary | ICD-10-CM | POA: Diagnosis not present

## 2015-02-28 DIAGNOSIS — R5381 Other malaise: Secondary | ICD-10-CM

## 2015-02-28 DIAGNOSIS — R5383 Other fatigue: Secondary | ICD-10-CM

## 2015-02-28 LAB — HEPATIC FUNCTION PANEL
ALBUMIN: 4.1 g/dL (ref 3.5–5.2)
ALT: 7 U/L (ref 0–35)
AST: 11 U/L (ref 0–37)
Alkaline Phosphatase: 83 U/L (ref 39–117)
BILIRUBIN DIRECT: 0.1 mg/dL (ref 0.0–0.3)
Total Bilirubin: 0.6 mg/dL (ref 0.2–1.2)
Total Protein: 7 g/dL (ref 6.0–8.3)

## 2015-02-28 LAB — BASIC METABOLIC PANEL
BUN: 14 mg/dL (ref 6–23)
CALCIUM: 9.6 mg/dL (ref 8.4–10.5)
CO2: 27 mEq/L (ref 19–32)
CREATININE: 0.98 mg/dL (ref 0.40–1.20)
Chloride: 105 mEq/L (ref 96–112)
GFR: 57.44 mL/min — AB (ref >60.00)
Glucose, Bld: 109 mg/dL — ABNORMAL HIGH (ref 70–99)
POTASSIUM: 4.3 meq/L (ref 3.5–5.1)
SODIUM: 138 meq/L (ref 135–145)

## 2015-02-28 LAB — LIPID PANEL
CHOL/HDL RATIO: 4
Cholesterol: 158 mg/dL (ref 0–200)
HDL: 43.4 mg/dL (ref >39.00)
LDL CALC: 82 mg/dL (ref 0–99)
NonHDL: 114.6
TRIGLYCERIDES: 161 mg/dL — AB (ref 0.0–149.0)
VLDL: 32.2 mg/dL (ref 0.0–40.0)

## 2015-02-28 NOTE — Patient Instructions (Signed)
Medication Instructions:  Your physician recommends that you continue on your current medications as directed. Please refer to the Current Medication list given to you today.  Labwork: Lp/bmet/hfp  Testing/Procedures: none  Follow-Up: Your physician recommends that you schedule a follow-up appointment in: 4 months with fasting labs (lp/bmet/hfp)

## 2015-02-28 NOTE — Progress Notes (Signed)
Cardiology Office Note   Date:  02/28/2015   ID:  Tina Mercado, DOB Feb 28, 1929, MRN [id number]  PCP:  Delman Cheadle, MD  Cardiologist: Darlin Coco MD  No chief complaint on file.     History of Present Illness: Tina Mercado is a 81 y.o. female who presents for a four-month follow-up office visit.  This pleasant 81 year old, Caucasian female is seen for a four-month followup office visit. She has been widowed since June of 2010. The patient has a history of essential hypertension. She also has a history of mitral valve disease with atrial arrhythmias. She has a history of essential hypertension with diastolic dysfunction. Since last visit she has stopped her ACE inhibitor and is on just a small dose of daily HCTZ. She takes either 6.25 mg or 12.5 mg pending on her blood pressure. She brought in a list of her blood pressures today which are excellent and are in a normal range with very little variation from day-to-day She is still having a lot of difficulty with her vision.  She cannot drive at night or on the highway.  Her physicians are suggesting additional eye surgery for her condition but she has not yet decided whether to have additional surgery or not. Past Medical History  Diagnosis Date  . Hypertension   . Mitral regurgitation     mild tomoderate  . History of anemia   . History of diastolic dysfunction   . Arrhythmia   . Heart murmur   . Hyperlipidemia   . Reflux   . History of seasonal allergies   . Allergy   . Cataract   . Glaucoma   . Osteoporosis     Past Surgical History  Procedure Laterality Date  . Corneal transplant  july 2007  . Tonsillectomy and adenoidectomy      age 76yrs  . Appendectomy    . Childbirth      x 1  . Breast surgery    . Eye surgery       Current Outpatient Prescriptions  Medication Sig Dispense Refill  . aspirin EC 81 MG tablet Take 81 mg by mouth daily.      . bromfenac (XIBROM) 0.09 % ophthalmic solution Place 1  drop into the right eye daily.     . cholecalciferol (VITAMIN D) 1000 UNITS tablet Take 2,000 Units by mouth daily.     . CRESTOR 10 MG tablet TAKE 1 TABLET BY MOUTH EVERY DAY 90 tablet 2  . Docusate Calcium (STOOL SOFTENER PO) Take 1 capsule by mouth as needed (stool softener.).     Marland Kitchen hydrochlorothiazide (MICROZIDE) 12.5 MG capsule Take 6.25 mg by mouth daily as needed (for blood pressure).    Marland Kitchen ibuprofen (ADVIL,MOTRIN) 200 MG tablet Take 200 mg by mouth as needed for pain (for arthritis pain).    . methocarbamol (ROBAXIN) 500 MG tablet Take 1 tablet (500 mg total) by mouth every 6 (six) hours as needed for muscle spasms. 50 tablet 0  . Multiple Vitamin (MULTIVITAMIN) tablet Take 1 tablet by mouth daily.      . naproxen sodium (ANAPROX) 220 MG tablet Take 220 mg by mouth 2 (two) times daily as needed (pain.).     Marland Kitchen prednisoLONE acetate (OMNIPRED) 1 % ophthalmic suspension Place 1 drop into the right eye 2 (two) times daily.     . timolol (BETIMOL) 0.5 % ophthalmic solution Place 1 drop into both eyes 2 (two) times daily.     No current facility-administered  medications for this visit.    Allergies:   Celebrex; Darvocet; Demerol; Feldene; and Lisinopril    Social History:  The patient  reports that she has never smoked. She has never used smokeless tobacco. She reports that she does not drink alcohol or use illicit drugs.   Family History:  The patient's family history includes COPD in her brother; Emphysema in her brother and father; Heart disease in her mother.    ROS:  Please see the history of present illness.   Otherwise, review of systems are positive for none.   All other systems are reviewed and negative.    PHYSICAL EXAM: VS:  BP 154/60 mmHg  Pulse 71  Ht 5' 2.5" (1.588 m)  Wt 146 lb 6.4 oz (66.407 kg)  BMI 26.33 kg/m2 , BMI Body mass index is 26.33 kg/(m^2). GEN: Well nourished, well developed, in no acute distress HEENT: normal Neck: no JVD, carotid bruits, or  masses Cardiac: RRR; no murmurs, rubs, or gallops,no edema  Respiratory:  clear to auscultation bilaterally, normal work of breathing GI: soft, nontender, nondistended, + BS MS: no deformity or atrophy Skin: warm and dry, no rash Neuro:  Strength and sensation are intact Psych: euthymic mood, full affect   EKG:  EKG is ordered today. The ekg ordered today demonstrates normal sinus rhythm with first-degree AV block.  Since previous tracing of 02/24/12, PVCs are no longer seen.   Recent Labs: 02/28/2015: ALT 7; BUN 14; Creatinine 0.98; Potassium 4.3; Sodium 138    Lipid Panel    Component Value Date/Time   CHOL 158 02/28/2015 1430   TRIG 161.0* 02/28/2015 1430   HDL 43.40 02/28/2015 1430   CHOLHDL 4 02/28/2015 1430   VLDL 32.2 02/28/2015 1430   LDLCALC 82 02/28/2015 1430      Wt Readings from Last 3 Encounters:  02/28/15 146 lb 6.4 oz (66.407 kg)  10/09/14 145 lb (65.772 kg)  10/07/14 146 lb (66.225 kg)         ASSESSMENT AND PLAN:  1. essential hypertension, improved on low dose daily diuretic 2. mild mitral regurgitation 3. hypercholesterolemia on Crestor 4. 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 Continue current medication. Recheck in 4 months for office visit lipid panel hepatic function panel and basal metabolic panel     Labs/ tests ordered today include:   Orders Placed This Encounter  Procedures  . Lipid panel  . Hepatic function panel  . Basic metabolic panel  . EKG 12-Lead     Follow-up in 4 months for follow-up office visit and lab work  Signed, Darlin Coco MD 02/28/2015 6:51 PM    [address], [place], [place]  [address] Phone: [phone]; Fax: [phone]

## 2015-02-28 NOTE — Progress Notes (Signed)
Quick Note:  Please report to patient. The recent labs are stable. Continue same medication and careful diet. ______ 

## 2015-03-18 ENCOUNTER — Encounter: Payer: Self-pay | Admitting: Family Medicine

## 2015-03-18 DIAGNOSIS — J449 Chronic obstructive pulmonary disease, unspecified: Secondary | ICD-10-CM

## 2015-03-18 DIAGNOSIS — R0602 Shortness of breath: Secondary | ICD-10-CM

## 2015-04-06 ENCOUNTER — Other Ambulatory Visit: Payer: Self-pay | Admitting: Cardiology

## 2015-04-10 ENCOUNTER — Other Ambulatory Visit: Payer: Medicare Other

## 2015-04-10 ENCOUNTER — Ambulatory Visit (INDEPENDENT_AMBULATORY_CARE_PROVIDER_SITE_OTHER)
Admission: RE | Admit: 2015-04-10 | Discharge: 2015-04-10 | Disposition: A | Discharge status: Home / Self Care | Payer: Medicare Other | Source: Ambulatory Visit | Attending: Pulmonary Disease | Admitting: Pulmonary Disease

## 2015-04-10 ENCOUNTER — Encounter: Payer: Self-pay | Admitting: Pulmonary Disease

## 2015-04-10 ENCOUNTER — Ambulatory Visit (INDEPENDENT_AMBULATORY_CARE_PROVIDER_SITE_OTHER): Payer: Medicare Other | Admitting: Pulmonary Disease

## 2015-04-10 VITALS — BP 150/82 | HR 80 | Temp 98.7°F | Resp 18 | Ht 62.0 in | Wt 148.0 lb

## 2015-04-10 DIAGNOSIS — J370 Chronic laryngitis: Secondary | ICD-10-CM | POA: Diagnosis not present

## 2015-04-10 DIAGNOSIS — I119 Hypertensive heart disease without heart failure: Secondary | ICD-10-CM | POA: Diagnosis not present

## 2015-04-10 DIAGNOSIS — J449 Chronic obstructive pulmonary disease, unspecified: Secondary | ICD-10-CM

## 2015-04-10 DIAGNOSIS — Z8679 Personal history of other diseases of the circulatory system: Secondary | ICD-10-CM

## 2015-04-10 DIAGNOSIS — I340 Nonrheumatic mitral (valve) insufficiency: Secondary | ICD-10-CM

## 2015-04-10 DIAGNOSIS — J387 Other diseases of larynx: Secondary | ICD-10-CM | POA: Diagnosis not present

## 2015-04-10 DIAGNOSIS — J40 Bronchitis, not specified as acute or chronic: Secondary | ICD-10-CM

## 2015-04-10 DIAGNOSIS — K219 Gastro-esophageal reflux disease without esophagitis: Secondary | ICD-10-CM

## 2015-04-10 MED ORDER — ESOMEPRAZOLE MAGNESIUM 40 MG PO CPDR: 40.0000 mg | DELAYED_RELEASE_CAPSULE | Freq: Every day | ORAL | Status: DC

## 2015-04-10 MED ORDER — BUDESONIDE-FORMOTEROL FUMARATE 160-4.5 MCG/ACT IN AERO: 2.0000 | INHALATION_SPRAY | Freq: Two times a day (BID) | RESPIRATORY_TRACT | Status: DC

## 2015-04-10 NOTE — Progress Notes (Addendum)
Subjective:     Patient ID: Tina Mercado, female   DOB: 1929-07-16, 81 y.o.   MRN: [id number]  HPI      81 y/o WF, retired [employer], referred by [employer] for a pulmonary evaluation due to dyspnea>  Tina Mercado is a never smoker and about 4 years ago she recalls a bad bout of bronchitis and CXR at the time indicated an overinflated chest c/w COPD; after this episode she noticed increasing SOB/DOE and had several evaluations at [facility] for repeated bronchitic infections- usually treated w/ antibiotics and improved but she's had gradual increased DOE to the point she notes dyspnea walking to the mailbox & back (has to rest but recovers quickly); notes that she does ok if she goes slowly but incr SOB is rushing or on an incline; she notes min cough (dry tickle), no sput, no blood, & she thinks allergy related w/ some dripping/ sneezing (but intol to antihist "they raise my BP"); states that her daughter has heard her wheeze; she has never been told to have asthma, tb or exposure, etc; she denies any occupational exposures in her past;  While she is a never smoker she notes +2nd hand smoke exposure in her life- Father died at 40 w/ emphysema but he was only a mild smoker per pt; one brother also has emphysema and smoked as well; she notes her daughter has COPD but only smoked a little she says...       She is followed by DrBrackbill for many yrs> HBP, mitral valve dis (mildMR), DD, atrial arrhythmias, & HL; prev on ACE inhib but this was stopped in the last several months; there is no 2DEcho in Epic to review; current meds= ASA81, HCT12.5- 1/2 tab daily, Cres10...      Other medical problems include visual difficulties w/ several corneal procedures & she is on several eye drops and facing a corneal transplant soon by Dr[facility]ack at [facility] (there are no records in [facility]);  She also saw ENT w/ hoarseness felt to be due to reflux, tried Nexium but didn't stick w/ it; she denies much heartburn, dysphagia, choking, etc;  CXRs have shown a large HH & she has never been on a vigorous antireflux regimen or seen GI for this...        EXAM showed Afeb, VSS, O2sat=97% on RA;  HEENT- neg, mallampati2;  Chest- decr BS at bases but clear w/o w/r/r;  Heart- RR Gr1/6SEM w/o r/g;  Abd- soft, nontender;  Ext- neg w/o c/c/e...  CXR 10/2012 showed norm heart size, tortuous Ao, mod sized HH, hyperinflation, clear, NAD  CXR 04/10/15 showed norm heart size, atherosclerotic changes in Ao, COPD/emphysema, mod sized HH seen, scarring left base w/o change, NAD...   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  Ambulatory oxygen saturation test 04/10/15> O2sat=97% on RA at rest w/ pulse=73/min; she ambulated 3 laps in the office w/ lowest O2sat=94% w/ pulse=107/min...  LABS 6/16:  Alpha-1-Antitrypsin level= 146 & enzyme phenotype= MM  IMP/PLAN>>  Tina Mercado had mod severe COPD, GOLD stage 2-3 by PFTs, and this is quite remarkable in a never smoker!  We will check an Alpha-1-AT level & phenotype since several family members also had COPD/emphysema and some where only mild smokers;  We will start SYMBICORT160- 2spBid and plan f/u w/ Full PFTs later to check LVs and DLCO, consider adding an anticholinergic later;  She also has a  signif HH on her CXR & I feel it would be prudent to place her on a vigorous antireflux regimen- Nexium40 before dinner, NPO after dinner, elev HOB 6", & Pepcid Qhs... She has corneal surg coming up soon, we will plan recheck in 4-6weeks.    Past Medical History  Diagnosis Date  . Hypertension   . Mitral regurgitation     mild tomoderate  . History of anemia   . History of diastolic dysfunction   . Arrhythmia   . Heart murmur   . Hyperlipidemia   . Reflux   . History of seasonal allergies   . Allergy   . Cataract   . Glaucoma   . Osteoporosis     Past Surgical History  Procedure  Laterality Date  . Corneal transplant  july 2007  . Tonsillectomy and adenoidectomy      age 74yrs  . Appendectomy    . Childbirth      x 1  . Breast surgery    . Eye surgery      Outpatient Encounter Prescriptions as of 04/10/2015  Medication Sig  . aspirin EC 81 MG tablet Take 81 mg by mouth daily.    . bromfenac (XIBROM) 0.09 % ophthalmic solution Place 1 drop into the right eye daily.   . cholecalciferol (VITAMIN D) 1000 UNITS tablet Take 2,000 Units by mouth daily.   . CRESTOR 10 MG tablet TAKE 1 TABLET BY MOUTH EVERY DAY  . Docusate Calcium (STOOL SOFTENER PO) Take 1 capsule by mouth as needed (stool softener.).   Marland Kitchen hydrochlorothiazide (MICROZIDE) 12.5 MG capsule Take 6.25 mg by mouth daily as needed (for blood pressure).  Marland Kitchen ibuprofen (ADVIL,MOTRIN) 200 MG tablet Take 200 mg by mouth as needed for pain (for arthritis pain).  . methocarbamol (ROBAXIN) 500 MG tablet Take 1 tablet (500 mg total) by mouth every 6 (six) hours as needed for muscle spasms.  . naproxen sodium (ANAPROX) 220 MG tablet Take 220 mg by mouth 2 (two) times daily as needed (pain.).   Marland Kitchen prednisoLONE acetate (OMNIPRED) 1 % ophthalmic suspension Place 1 drop into the right eye 2 (two) times daily.   . timolol (BETIMOL) 0.5 % ophthalmic solution Place 1 drop into both eyes 2 (two) times daily.  . budesonide-formoterol (SYMBICORT) 160-4.5 MCG/ACT inhaler Inhale 2 puffs into the lungs 2 (two) times daily.  Marland Kitchen esomeprazole (NEXIUM) 40 MG capsule Take 1 capsule (40 mg total) by mouth daily before supper.  . Multiple Vitamin (MULTIVITAMIN) tablet Take 1 tablet by mouth daily.     No facility-administered encounter medications on file as of 04/10/2015.    Allergies  Allergen Reactions  . Antihistamines, Diphenhydramine-Type Other (See Comments)    Increases blood pressure   . Celebrex [Celecoxib]     edema  . Darvocet [Propoxyphene N-Acetaminophen]     nausea  . Demerol     nausea  . Feldene [Piroxicam]     edema   . Lisinopril     Hoarseness     Family History  Problem Relation Age of Onset  . Heart disease Mother   . Emphysema Father   . Emphysema Brother   . COPD Brother     History   Social History  . Marital Status: Single    Spouse Name: N/A  . Number of Children: N/A  . Years of Education: N/A   Occupational History  . Not on file.   Social History Main Topics  . Smoking status: Never Smoker   .  Smokeless tobacco: Never Used  . Alcohol Use: No  . Drug Use: No  . Sexual Activity: No   Other Topics Concern  . Not on file   Social History Narrative    Current Medications, Allergies, Past Medical History, Past Surgical History, Family History, and Social History were reviewed in [employer] record.   Review of Systems            All symptoms NEG except where BOLDED >>  Constitutional:  F/C/S, fatigue, anorexia, unexpected weight change. HEENT:  HA, visual changes, hearing loss, earache, nasal symptoms, sore throat, mouth sores, hoarseness. Resp:  cough, sputum, hemoptysis; SOB, tightness, wheezing. Cardio:  CP, palpit, DOE, orthopnea, edema. GI:  N/V/D/C, blood in stool; reflux, abd pain, distention, gas. GU:  dysuria, freq, urgency, hematuria, flank pain, voiding difficulty. MS:  joint pain, swelling, tenderness, decr ROM; neck pain, back pain, etc. Neuro:  HA, tremors, seizures, dizziness, syncope, weakness, numbness, gait abn. Skin:  suspicious lesions or skin rash. Heme:  adenopathy, bruising, bleeding. Psyche:  confusion, agitation, sleep disturbance, hallucinations, anxiety, depression suicidal.   Objective:   Physical Exam      Vital Signs:  Reviewed...  General:  WD, WN, 81 y/o WF in NAD; alert & oriented; pleasant & cooperative... HEENT:  [place]/AT; Conjunctiva- pink, Sclera- nonicteric, EOM-wnl, Corneal problems, EACs-clear, TMs-wnl; NOSE-clear; THROAT-clear & wnl. Neck:  Supple w/ fair ROM; no JVD; normal carotid impulses w/o  bruits; no thyromegaly or nodules palpated; no lymphadenopathy. Chest:  Decr BS at bases but clear w/o w/r/r Heart:  Regular Rhythm; without murmurs, rubs, or gallops detected. Abdomen:  Soft & nontender- no guarding or rebound; normal bowel sounds; no organomegaly or masses palpated. Ext:  Normal ROM; without deformities or arthritic changes; no varicose veins, +venous insuffic, no edema;  Pulses intact w/o bruits. Neuro:  No focal neuro deficitsl; gait normal & balance OK. Derm:  No lesions noted; no rash etc. Lymph:  No cervical, supraclavicular, axillary, or inguinal adenopathy palpated.   Assessment:      COPD- likely mixed chr bronchitis & emphysema but she is a never smoker! We will check A1AT level & start Rx w/ ICS/LABA- SYMBICORT160- 2spBid... Mod hiatus hernia on CXR-- rec vigorous antireflux regimen w/ Nexium, NPO after dinner, elev HOB, pepcid...  HBP Mitral valve dis Atrial arrhythmias HL Corneal transplant  IMP/PLAN>>  Tina Mercado had mod severe COPD, GOLD stage 2-3 by PFTs, and this is quite remarkable in a never smoker!  We will check an Alpha-1-AT level & phenotype since several family members also had COPD/emphysema and some where only mild smokers;  We will start SYMBICORT160- 2spBid and plan f/u w/ Full PFTs later to check LVs and DLCO, consider adding an anticholinergic later;  She also has a signif HH on her CXR & I feel it would be prudent to place her on a vigorous antireflux regimen- Nexium40 before dinner, NPO after dinner, elev HOB 6", & Pepcid Qhs... She has corneal surg coming up soon, we will plan recheck in 4-6weeks     Plan:     Patient's Medications  New Prescriptions   BUDESONIDE-FORMOTEROL (SYMBICORT) 160-4.5 MCG/ACT INHALER    Inhale 2 puffs into the lungs 2 (two) times daily.   ESOMEPRAZOLE (NEXIUM) 40 MG CAPSULE    Take 1 capsule (40 mg total) by mouth daily before supper.  Previous Medications   ASPIRIN EC 81 MG TABLET    Take 81 mg by mouth daily.  BROMFENAC (XIBROM) 0.09 % OPHTHALMIC SOLUTION    Place 1 drop into the right eye daily.    CHOLECALCIFEROL (VITAMIN D) 1000 UNITS TABLET    Take 2,000 Units by mouth daily.    CRESTOR 10 MG TABLET    TAKE 1 TABLET BY MOUTH EVERY DAY   DOCUSATE CALCIUM (STOOL SOFTENER PO)    Take 1 capsule by mouth as needed (stool softener.).    HYDROCHLOROTHIAZIDE (MICROZIDE) 12.5 MG CAPSULE    Take 6.25 mg by mouth daily as needed (for blood pressure).   IBUPROFEN (ADVIL,MOTRIN) 200 MG TABLET    Take 200 mg by mouth as needed for pain (for arthritis pain).   METHOCARBAMOL (ROBAXIN) 500 MG TABLET    Take 1 tablet (500 mg total) by mouth every 6 (six) hours as needed for muscle spasms.   MULTIPLE VITAMIN (MULTIVITAMIN) TABLET    Take 1 tablet by mouth daily.     NAPROXEN SODIUM (ANAPROX) 220 MG TABLET    Take 220 mg by mouth 2 (two) times daily as needed (pain.).    PREDNISOLONE ACETATE (OMNIPRED) 1 % OPHTHALMIC SUSPENSION    Place 1 drop into the right eye 2 (two) times daily.    TIMOLOL (BETIMOL) 0.5 % OPHTHALMIC SOLUTION    Place 1 drop into both eyes 2 (two) times daily.  Modified Medications   No medications on file  Discontinued Medications   No medications on file

## 2015-04-10 NOTE — Patient Instructions (Signed)
Lamira- it was a pleasure meeting you today...  Today we did a follow up CXR, PFT, and blood work to check your Alpha-1 level...    We will contact you w/ the results when available...   Despite your non-smoking history, the PFTs showed some moderate obstructive lung disease...    In this regard I would like to start SYMBICORT160- 2sprays twice daily on a regular basis.Marland KitchenMarland Kitchen  In addition- we want to vigorously treat any nocturnal reflux from your hiatus hernia (visible on the CXR)...    Take the Nexium 40mg  about 30 min before the evening meal...    Do not eat or drink much after dinner in the eve...    Elevate the head of your bed on 6" blocks...    Take OTC Pepcid about 1H before bedtime w/ a si[p of water...  Call for any questions...  Let's plan a follow up visit in 6-8 weeks, sooner if needed for problems.Marland KitchenMarland Kitchen

## 2015-04-11 ENCOUNTER — Telehealth: Payer: Self-pay | Admitting: Pulmonary Disease

## 2015-04-11 NOTE — Telephone Encounter (Signed)
Result Notes     Notes Recorded by Osa Craver, CMA on 04/11/2015 at 11:22 AM LVM for pt to return call. ------  Notes Recorded by Noralee Space, MD on 04/10/2015 at 4:13 PM Please notify patient>  CXR shows norm heart size, some atherosclerotic changes in Ao, underlying chronic lung dis w/ scarring in LLL area, mod HH behind the heart, no acute changes...     Pt is aware of results. Nothing further was needed.

## 2015-04-11 NOTE — Progress Notes (Signed)
Quick Note:  LVM for pt to return call ______ 

## 2015-04-13 LAB — ALPHA-1 ANTITRYPSIN PHENOTYPE: A-1 Antitrypsin: 146 mg/dL (ref 83–199)

## 2015-04-16 ENCOUNTER — Encounter: Payer: Self-pay | Admitting: Pulmonary Disease

## 2015-04-16 NOTE — Telephone Encounter (Signed)
SN please advise. Thanks.  

## 2015-04-17 MED ORDER — AEROCHAMBER MV MISC: Status: DC

## 2015-04-17 NOTE — Addendum Note (Signed)
Addended by: Parke Poisson E on: 04/17/2015 05:29 PM   Modules accepted: Orders

## 2015-04-24 DIAGNOSIS — H1851 Endothelial corneal dystrophy: Secondary | ICD-10-CM | POA: Diagnosis not present

## 2015-04-24 DIAGNOSIS — H179 Unspecified corneal scar and opacity: Secondary | ICD-10-CM | POA: Diagnosis not present

## 2015-04-24 DIAGNOSIS — J449 Chronic obstructive pulmonary disease, unspecified: Secondary | ICD-10-CM | POA: Diagnosis not present

## 2015-04-24 DIAGNOSIS — E785 Hyperlipidemia, unspecified: Secondary | ICD-10-CM | POA: Diagnosis not present

## 2015-04-24 DIAGNOSIS — T86848 Other complications of corneal transplant: Secondary | ICD-10-CM | POA: Diagnosis not present

## 2015-04-24 DIAGNOSIS — I10 Essential (primary) hypertension: Secondary | ICD-10-CM | POA: Diagnosis not present

## 2015-04-24 DIAGNOSIS — E780 Pure hypercholesterolemia: Secondary | ICD-10-CM | POA: Diagnosis not present

## 2015-04-24 DIAGNOSIS — Z885 Allergy status to narcotic agent status: Secondary | ICD-10-CM | POA: Diagnosis not present

## 2015-04-24 DIAGNOSIS — I340 Nonrheumatic mitral (valve) insufficiency: Secondary | ICD-10-CM | POA: Diagnosis not present

## 2015-04-24 DIAGNOSIS — Z888 Allergy status to other drugs, medicaments and biological substances status: Secondary | ICD-10-CM | POA: Diagnosis not present

## 2015-04-24 DIAGNOSIS — Z91041 Radiographic dye allergy status: Secondary | ICD-10-CM | POA: Diagnosis not present

## 2015-06-05 ENCOUNTER — Encounter: Payer: Self-pay | Admitting: Pulmonary Disease

## 2015-06-05 ENCOUNTER — Ambulatory Visit (INDEPENDENT_AMBULATORY_CARE_PROVIDER_SITE_OTHER): Payer: Medicare Other | Admitting: Pulmonary Disease

## 2015-06-05 ENCOUNTER — Ambulatory Visit: Payer: Medicare Other | Admitting: Pulmonary Disease

## 2015-06-05 VITALS — BP 122/62 | HR 72 | Temp 97.0°F | Ht 62.5 in | Wt 147.8 lb

## 2015-06-05 DIAGNOSIS — I340 Nonrheumatic mitral (valve) insufficiency: Secondary | ICD-10-CM

## 2015-06-05 DIAGNOSIS — I119 Hypertensive heart disease without heart failure: Secondary | ICD-10-CM | POA: Diagnosis not present

## 2015-06-05 DIAGNOSIS — Z8679 Personal history of other diseases of the circulatory system: Secondary | ICD-10-CM

## 2015-06-05 DIAGNOSIS — J387 Other diseases of larynx: Secondary | ICD-10-CM

## 2015-06-05 DIAGNOSIS — J449 Chronic obstructive pulmonary disease, unspecified: Secondary | ICD-10-CM

## 2015-06-05 DIAGNOSIS — K219 Gastro-esophageal reflux disease without esophagitis: Secondary | ICD-10-CM

## 2015-06-05 NOTE — Progress Notes (Signed)
Subjective:     Patient ID: Tina Mercado, female   DOB: Nov 09, 1928, 81 y.o.   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, referred by Tina Mercado for a pulmonary evaluation due to dyspnea>  Tina Mercado is a never smoker and about 4 years ago she recalls a bad bout of bronchitis and CXR at the time indicated an overinflated chest c/w COPD; after this episode she noticed increasing SOB/DOE and had several evaluations at [facility]ado for repeated bronchitic infections- usually treated w/ antibiotics and improved but she's had gradual increased DOE to the point she notes dyspnea walking to the mailbox & back (has to rest but recovers quickly); notes that she does ok if she goes slowly but incr SOB is rushing or on an incline; she notes min cough (dry tickle), no sput, no blood, & she thinks allergy related w/ some dripping/ sneezing (but intol to antihist "they raise my BP"); states that her daughter has heard her wheeze; she has never been told to have asthma, tb or exposure, etc; she denies any occupational exposures in her past;  While she is a never smoker she notes +2nd hand smoke exposure in her life- Father died at 28 w/ emphysema but he was only a mild smoker per pt; one brother also has emphysema and smoked as well; she notes her daughter has COPD but only smoked a little she says...       She is followed by Tina Mercado for many yrs> HBP, mitral valve dis (mildMR), DD, atrial arrhythmias, & HL; prev on ACE inhib but this was stopped in the last several months; there is no 2DEcho in Epic to review; current meds= ASA81, HCT12.5- 1/2 tab daily, Cres10...      Other medical problems include visual difficulties w/ several corneal procedures & she is on several eye drops and facing a corneal transplant soon by [facility] at [facility] (there are no records in [facility]);  She also saw ENT w/ hoarseness felt to be due to reflux, tried Nexium but didn't stick w/  it; she denies much heartburn, dysphagia, choking, etc; CXRs have shown a large HH & she has never been on a vigorous antireflux regimen or seen GI for this...       EXAM showed Afeb, VSS, O2sat=97% on RA;  HEENT- neg, mallampati2;  Chest- decr BS at bases but clear w/o w/r/r;  Heart- RR Gr1/6SEM w/o r/g;  Abd- soft, nontender;  Ext- neg w/o c/c/e...  CXR 10/2012 showed norm heart size, tortuous Ao, mod sized HH, hyperinflation, clear, NAD  CXR 04/10/15 showed norm heart size, atherosclerotic changes in Ao, COPD/emphysema, mod sized HH seen, scarring left base w/o change, NAD...   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  Ambulatory oxygen saturation test 04/10/15> O2sat=97% on RA at rest w/ pulse=73/min; she ambulated 3 laps in the office w/ lowest O2sat=94% w/ pulse=107/min...  LABS 6/16:  Alpha-1-Antitrypsin level= 146 & enzyme phenotype= MM     IMP/PLAN>>  Tina Mercado had mod severe COPD, GOLD stage 2-3 by PFTs, and this is quite remarkable in a never smoker!  We will check an Alpha-1-AT level & phenotype since several family members also had COPD/emphysema and some where only mild smokers;  We will start SYMBICORT160- 2spBid and plan f/u w/ Full PFTs later  to check LVs and DLCO, consider adding an anticholinergic later;  She also has a signif HH on her CXR & I feel it would be prudent to place her on a vigorous antireflux regimen- Nexium40 before dinner, NPO after dinner, elev HOB 6", & Pepcid Qhs... She has corneal surg coming up soon, we will plan recheck in 4-6weeks.  ~  June 05, 2015:  45mo ROV w/ SN>  During our initial evaluation 04/10/15 Tina Mercado showed evid of mod obstructive lung dis (GOLD Stage2-3) despite being a never smoker; she also had a large HH & we reviewed needed antireflux regimen + starting SYMBICORT160-2spBid;  She reports improvement on the inhaler via  aerochamber- she is less SOB & able to walk farther she says, do her housework etc; she notes min cough, no sput or hemoptysis, no CP etc... she had a corneal transplant in the interval ([facility]) and she has been given permission to start exercising...     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 A1AT level was norm & phenotype MM; she feels better on the ICS/LABA...    Mod hiatus hernia on CXR> on vigorous antireflux regimen w/ Nexium, NPO after dinner, elev HOB, pepcid Qhs... EXAM showed Afeb, VSS, O2sat=97% on RA;  HEENT- neg, mallampati2;  Chest- decr BS at bases but clear w/o w/r/r;  Heart- RR Gr1/6SEM w/o r/g;  Abd- soft, nontender;  Ext- neg w/o c/c/e... IMP/PLAN>>  Tina Mercado is feeling better on the Symbicort & antireflux regimen- encouraged to continue this therapy regularly; needs to check w/ her PCP to be sure she is up to date on all needed vaccinations; try to avoid infections/ exacerbations & we will plan routine ROV in about 54mo...    Past Medical History  Diagnosis Date  . Hypertension >> on HCT12.5mg - taking 1/2 tab prn   . Mitral regurgitation     mild tomoderate  . History of anemia   . History of diastolic dysfunction   . Arrhythmia   . Heart murmur   . Hyperlipidemia >> on Crestor10/d   . Reflux/ HH seen on CXR >> on Nexium40, antireflux regimen, Pepcid   . History of seasonal allergies   . Allergy   . Cataract   . Glaucoma   . Osteoporosis     Past Surgical History  Procedure Laterality Date  . Corneal transplant  july 2007  . Tonsillectomy and adenoidectomy      age 82yrs  . Appendectomy    . Childbirth      x 1  . Breast surgery    . Eye surgery      Outpatient Encounter Prescriptions as of 06/05/2015  Medication Sig  . aspirin EC 81 MG tablet Take 81 mg by mouth daily.    . bromfenac (XIBROM) 0.09 % ophthalmic solution Place 1 drop into the right eye daily.   .  budesonide-formoterol (SYMBICORT) 160-4.5 MCG/ACT inhaler Inhale 2 puffs into the lungs 2 (two) times daily.  . cholecalciferol (VITAMIN D) 1000 UNITS tablet Take 2,000 Units by mouth daily.   . CRESTOR 10 MG tablet TAKE 1 TABLET BY MOUTH EVERY DAY  . Docusate Calcium (STOOL SOFTENER PO) Take 1 capsule by mouth as needed (stool softener.).   Marland Kitchen esomeprazole (NEXIUM) 40 MG capsule Take 1 capsule (40 mg total) by mouth daily before supper.  . hydrochlorothiazide (MICROZIDE) 12.5 MG capsule Take 6.25 mg by mouth daily as needed (for blood pressure).  Marland Kitchen  ibuprofen (ADVIL,MOTRIN) 200 MG tablet Take 200 mg by mouth as needed for pain (for arthritis pain).  . methocarbamol (ROBAXIN) 500 MG tablet Take 1 tablet (500 mg total) by mouth every 6 (six) hours as needed for muscle spasms.  . Multiple Vitamin (MULTIVITAMIN) tablet Take 1 tablet by mouth daily.    . naproxen sodium (ANAPROX) 220 MG tablet Take 220 mg by mouth 2 (two) times daily as needed (pain.).   Marland Kitchen prednisoLONE acetate (OMNIPRED) 1 % ophthalmic suspension Place 1 drop into the right eye 2 (two) times daily.   Marland Kitchen Spacer/Aero-Holding Chambers (AEROCHAMBER MV) inhaler Use as instructed  . timolol (BETIMOL) 0.5 % ophthalmic solution Place 1 drop into both eyes 2 (two) times daily.   No facility-administered encounter medications on file as of 06/05/2015.    Allergies  Allergen Reactions  . Antihistamines, Diphenhydramine-Type Other (See Comments)    Increases blood pressure   . Celebrex [Celecoxib]     edema  . Darvocet [Propoxyphene N-Acetaminophen]     nausea  . Demerol     nausea  . Feldene [Piroxicam]     edema  . Lisinopril     Hoarseness   . Iodine-131 Rash    IVP dye    Current Medications, Allergies, Past Medical History, Past Surgical History, Family History, and Social History were reviewed in [employer] record.   Review of Systems            All symptoms NEG except where BOLDED >>    Constitutional:  F/C/S, fatigue, anorexia, unexpected weight change. HEENT:  HA, visual changes, hearing loss, earache, nasal symptoms, sore throat, mouth sores, hoarseness. Resp:  cough, sputum, hemoptysis; SOB, tightness, wheezing. Cardio:  CP, palpit, DOE, orthopnea, edema. GI:  N/V/D/C, blood in stool; reflux, abd pain, distention, gas. GU:  dysuria, freq, urgency, hematuria, flank pain, voiding difficulty. MS:  joint pain, swelling, tenderness, decr ROM; neck pain, back pain, etc. Neuro:  HA, tremors, seizures, dizziness, syncope, weakness, numbness, gait abn. Skin:  suspicious lesions or skin rash. Heme:  adenopathy, bruising, bleeding. Psyche:  confusion, agitation, sleep disturbance, hallucinations, anxiety, depression suicidal.   Objective:   Physical Exam      Vital Signs:  Reviewed...  General:  WD, WN, 81 y/o WF in NAD; alert & oriented; pleasant & cooperative... HEENT:  [place]/AT; Conjunctiva- pink, Sclera- nonicteric, EOM-wnl, Corneal problems, EACs-clear, TMs-wnl; NOSE-clear; THROAT-clear & wnl. Neck:  Supple w/ fair ROM; no JVD; normal carotid impulses w/o bruits; no thyromegaly or nodules palpated; no lymphadenopathy. Chest:  Decr BS at bases but clear w/o w/r/r Heart:  Regular Rhythm; without murmurs, rubs, or gallops detected. Abdomen:  Soft & nontender- no guarding or rebound; normal bowel sounds; no organomegaly or masses palpated. Ext:  Normal ROM; without deformities or arthritic changes; no varicose veins, +venous insuffic, no edema;  Pulses intact w/o bruits. Neuro:  No focal neuro deficitsl; gait normal & balance OK. Derm:  No lesions noted; no rash etc. Lymph:  No cervical, supraclavicular, axillary, or inguinal adenopathy palpated.   Assessment:      IMP >>     COPD> continue Symbicort160-2spBid    Mod hiatus hernia on CXR> continue vigorous antireflux regimen w/ Nexium, NPO after dinner, elev HOB, pepcid...  PLAN>>   Taisley is feeling better on the  Symbicort & antireflux regimen- encouraged to continue this therapy regularly; needs to check w/ her PCP to be sure she is up to date on all needed vaccinations; try to  avoid infections/ exacerbations & we will plan routine ROV in about 46mo.     Plan:     Patient's Medications  New Prescriptions   No medications on file  Previous Medications   ASPIRIN EC 81 MG TABLET    Take 81 mg by mouth daily.     BROMFENAC (XIBROM) 0.09 % OPHTHALMIC SOLUTION    Place 1 drop into the right eye daily.    BUDESONIDE-FORMOTEROL (SYMBICORT) 160-4.5 MCG/ACT INHALER    Inhale 2 puffs into the lungs 2 (two) times daily.   CHOLECALCIFEROL (VITAMIN D) 1000 UNITS TABLET    Take 2,000 Units by mouth daily.    CRESTOR 10 MG TABLET    TAKE 1 TABLET BY MOUTH EVERY DAY   DOCUSATE CALCIUM (STOOL SOFTENER PO)    Take 1 capsule by mouth as needed (stool softener.).    ESOMEPRAZOLE (NEXIUM) 40 MG CAPSULE    Take 1 capsule (40 mg total) by mouth daily before supper.   HYDROCHLOROTHIAZIDE (MICROZIDE) 12.5 MG CAPSULE    Take 6.25 mg by mouth daily as needed (for blood pressure).   IBUPROFEN (ADVIL,MOTRIN) 200 MG TABLET    Take 200 mg by mouth as needed for pain (for arthritis pain).   METHOCARBAMOL (ROBAXIN) 500 MG TABLET    Take 1 tablet (500 mg total) by mouth every 6 (six) hours as needed for muscle spasms.   MULTIPLE VITAMIN (MULTIVITAMIN) TABLET    Take 1 tablet by mouth daily.     NAPROXEN SODIUM (ANAPROX) 220 MG TABLET    Take 220 mg by mouth 2 (two) times daily as needed (pain.).    PREDNISOLONE ACETATE (OMNIPRED) 1 % OPHTHALMIC SUSPENSION    Place 1 drop into the right eye 2 (two) times daily.    SPACER/AERO-HOLDING CHAMBERS (AEROCHAMBER MV) INHALER    Use as instructed   TIMOLOL (BETIMOL) 0.5 % OPHTHALMIC SOLUTION    Place 1 drop into both eyes 2 (two) times daily.  Modified Medications   No medications on file  Discontinued Medications   No medications on file

## 2015-06-05 NOTE — Patient Instructions (Signed)
Today we updated your med list in our EPIC system...    Continue your current medications the same...  Stay as active as possible...  Call for any questions...  Let's plan a follow up visit in 6mo, sooner if needed for problems...   

## 2015-07-03 ENCOUNTER — Encounter: Payer: Self-pay | Admitting: *Deleted

## 2015-07-04 ENCOUNTER — Encounter: Payer: Self-pay | Admitting: Cardiology

## 2015-07-04 ENCOUNTER — Ambulatory Visit (INDEPENDENT_AMBULATORY_CARE_PROVIDER_SITE_OTHER): Payer: Medicare Other | Admitting: Cardiology

## 2015-07-04 VITALS — BP 112/64 | HR 66 | Ht 62.5 in | Wt 147.8 lb

## 2015-07-04 DIAGNOSIS — E7800 Pure hypercholesterolemia, unspecified: Secondary | ICD-10-CM

## 2015-07-04 DIAGNOSIS — I119 Hypertensive heart disease without heart failure: Secondary | ICD-10-CM

## 2015-07-04 DIAGNOSIS — I340 Nonrheumatic mitral (valve) insufficiency: Secondary | ICD-10-CM

## 2015-07-04 LAB — HEPATIC FUNCTION PANEL
ALT: 23 U/L (ref 0–35)
AST: 24 U/L (ref 0–37)
Albumin: 4.1 g/dL (ref 3.5–5.2)
Alkaline Phosphatase: 72 U/L (ref 39–117)
Bilirubin, Direct: 0.1 mg/dL (ref 0.0–0.3)
Total Bilirubin: 0.6 mg/dL (ref 0.2–1.2)
Total Protein: 7.1 g/dL (ref 6.0–8.3)

## 2015-07-04 LAB — LIPID PANEL
CHOLESTEROL: 165 mg/dL (ref 0–200)
HDL: 48.9 mg/dL (ref >39.00)
LDL CALC: 82 mg/dL (ref 0–99)
NONHDL: 115.67
Total CHOL/HDL Ratio: 3
Triglycerides: 166 mg/dL — ABNORMAL HIGH (ref 0.0–149.0)
VLDL: 33.2 mg/dL (ref 0.0–40.0)

## 2015-07-04 LAB — BASIC METABOLIC PANEL
BUN: 18 mg/dL (ref 6–23)
CO2: 27 mEq/L (ref 19–32)
Calcium: 9.5 mg/dL (ref 8.4–10.5)
Chloride: 103 mEq/L (ref 96–112)
Creatinine, Ser: 1.05 mg/dL (ref 0.40–1.20)
GFR: 53 mL/min — AB (ref >60.00)
Glucose, Bld: 107 mg/dL — ABNORMAL HIGH (ref 70–99)
POTASSIUM: 4.6 meq/L (ref 3.5–5.1)
SODIUM: 137 meq/L (ref 135–145)

## 2015-07-04 NOTE — Progress Notes (Signed)
Cardiology Office Note   Date:  07/04/2015   ID:  Tina Mercado, DOB 1929-05-31, MRN [id number]  PCP:  Delman Cheadle, MD  Cardiologist: Darlin Coco MD  No chief complaint on file.     History of Present Illness: Tina Mercado is a 81 y.o. female who presents for a four-month follow-up visit  This pleasant 81 year old, Caucasian female is seen for a four-month followup office visit. She has been widowed since June of 2010. The patient has a history of essential hypertension. She also has a history of mitral valve disease with atrial arrhythmias. She has a history of essential hypertension with diastolic dysfunction. Since last visit she has stopped her ACE inhibitor and is on just a small dose of daily HCTZ. She takes either 6.25 mg or 12.5 mg pending on her blood pressure.  Many days she does not have to take anything at all.  She states it is been several weeks since she took any blood pressure medicine at all.  She checks her blood pressure and it has been staying normal. She brought in a list of her blood pressures today which are excellent and are in a normal range with very little variation from day-to-day Since last visit she did have surgery on her right eye.  She has noted an improvement in vision since the surgery. She has not been getting any regular exercise but now that her is better she is considering rejoining [employer]. She denies any chest discomfort or shortness of breath or palpitations.  She has had some leg cramps at night. She has been diagnosed as having mild emphysema and sees Dr. Lenna Gilford who has her on Symbicort..  Past Medical History  Diagnosis Date  . Hypertension   . Mitral regurgitation     mild to moderate  . History of anemia   . History of diastolic dysfunction   . Arrhythmia   . Heart murmur   . Hyperlipidemia   . Reflux   . History of seasonal allergies   . Allergy   . Cataract   . Glaucoma   . Osteoporosis     Past Surgical  History  Procedure Laterality Date  . Corneal transplant  july 2007  . Tonsillectomy and adenoidectomy      age 45yrs  . Appendectomy    . Childbirth      x 1  . Breast surgery    . Eye surgery       Current Outpatient Prescriptions  Medication Sig Dispense Refill  . aspirin EC 81 MG tablet Take 81 mg by mouth daily.      . bromfenac (XIBROM) 0.09 % ophthalmic solution Place 1 drop into the right eye daily.     . budesonide-formoterol (SYMBICORT) 160-4.5 MCG/ACT inhaler Inhale 2 puffs into the lungs 2 (two) times daily. 10.2 g 5  . cholecalciferol (VITAMIN D) 1000 UNITS tablet Take 2,000 Units by mouth daily.     . CRESTOR 10 MG tablet TAKE 1 TABLET BY MOUTH EVERY DAY 90 tablet 2  . Docusate Calcium (STOOL SOFTENER PO) Take 1 capsule by mouth as needed (stool softener.).     Marland Kitchen esomeprazole (NEXIUM) 40 MG capsule Take 1 capsule (40 mg total) by mouth daily before supper. 90 capsule 1  . hydrochlorothiazide (MICROZIDE) 12.5 MG capsule Take 6.25 mg by mouth daily as needed (for blood pressure).    Marland Kitchen ibuprofen (ADVIL,MOTRIN) 200 MG tablet Take 200 mg by mouth as needed for pain (for arthritis  pain).    . methocarbamol (ROBAXIN) 500 MG tablet Take 1 tablet (500 mg total) by mouth every 6 (six) hours as needed for muscle spasms. 50 tablet 0  . Multiple Vitamin (MULTIVITAMIN) tablet Take 1 tablet by mouth daily.      . naproxen sodium (ANAPROX) 220 MG tablet Take 220 mg by mouth 2 (two) times daily as needed (pain.).     Marland Kitchen prednisoLONE acetate (OMNIPRED) 1 % ophthalmic suspension Place 1 drop into the right eye 2 (two) times daily.     Marland Kitchen Spacer/Aero-Holding Chambers (AEROCHAMBER MV) inhaler Use as instructed 1 each 0  . timolol (BETIMOL) 0.5 % ophthalmic solution Place 1 drop into both eyes 2 (two) times daily.     No current facility-administered medications for this visit.    Allergies:   Antihistamines, diphenhydramine-type; Celebrex; Darvocet; Demerol; Feldene; Lisinopril; Loratadine;  and Iodine-131    Social History:  The patient  reports that she has never smoked. She has never used smokeless tobacco. She reports that she does not drink alcohol or use illicit drugs.   Family History:  The patient's family history includes COPD in her brother; Emphysema in her brother and father; Heart disease in her mother.    ROS:  Please see the history of present illness.   Otherwise, review of systems are positive for none.   All other systems are reviewed and negative.    PHYSICAL EXAM: VS:  BP 112/64 mmHg  Pulse 66  Ht 5' 2.5" (1.588 m)  Wt 147 lb 12.8 oz (67.042 kg)  BMI 26.59 kg/m2 , BMI Body mass index is 26.59 kg/(m^2). GEN: Well nourished, well developed, in no acute distress HEENT: normal Neck: no JVD, carotid bruits, or masses Cardiac: RRR; no murmurs, rubs, or gallops,no edema  Respiratory:  clear to auscultation bilaterally, normal work of breathing GI: soft, nontender, nondistended, + BS MS: no deformity or atrophy Skin: warm and dry, no rash Neuro:  Strength and sensation are intact Psych: euthymic mood, full affect   EKG:  EKG is not ordered today.    Recent Labs: 07/04/2015: ALT 23; BUN 18; Creatinine, Ser 1.05; Potassium 4.6; Sodium 137    Lipid Panel    Component Value Date/Time   CHOL 165 07/04/2015 1351   TRIG 166.0* 07/04/2015 1351   HDL 48.90 07/04/2015 1351   CHOLHDL 3 07/04/2015 1351   VLDL 33.2 07/04/2015 1351   LDLCALC 82 07/04/2015 1351      Wt Readings from Last 3 Encounters:  07/04/15 147 lb 12.8 oz (67.042 kg)  06/05/15 147 lb 12.8 oz (67.042 kg)  04/10/15 148 lb (67.132 kg)        ASSESSMENT AND PLAN:  1. essential hypertension, stable on only occasional HCTZ now 2. mild mitral regurgitation 3. hypercholesterolemia on Crestor 4. 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 Continue current medication. Recheck in 4 months for office visit lipid panel  hepatic function panel and basal metabolic panel   Current medicines are reviewed at length with the patient today.  The patient does not have concerns regarding medicines.  The following changes have been made:  no change  Labs/ tests ordered today include:  No orders of the defined types were placed in this encounter.      Berna Spare MD 07/04/2015 6:17 PM    [facility] [address], [place], [place]  [address] Phone: [phone]; Fax: [phone]

## 2015-07-04 NOTE — Progress Notes (Signed)
Quick Note:  Please report to patient. The recent labs are stable. Continue same medication and careful diet. ______ 

## 2015-07-04 NOTE — Patient Instructions (Signed)
Medication Instructions:  Your physician recommends that you continue on your current medications as directed. Please refer to the Current Medication list given to you today.  Labwork: Lp/bmet/hfp  Testing/Procedures: none  Follow-Up: Your physician wants you to follow-up in: 4 months with fasting labs (lp/bmet/hfp)  You will receive a reminder letter in the mail two months in advance. If you don't receive a letter, please call our office to schedule the follow-up appointment.     

## 2015-07-16 ENCOUNTER — Other Ambulatory Visit: Payer: Self-pay | Admitting: *Deleted

## 2015-07-16 DIAGNOSIS — E7800 Pure hypercholesterolemia, unspecified: Secondary | ICD-10-CM

## 2015-07-19 DIAGNOSIS — Z947 Corneal transplant status: Secondary | ICD-10-CM | POA: Diagnosis not present

## 2015-07-19 DIAGNOSIS — H1851 Endothelial corneal dystrophy: Secondary | ICD-10-CM | POA: Diagnosis not present

## 2015-07-19 DIAGNOSIS — H2512 Age-related nuclear cataract, left eye: Secondary | ICD-10-CM | POA: Diagnosis not present

## 2015-07-19 DIAGNOSIS — H35353 Cystoid macular degeneration, bilateral: Secondary | ICD-10-CM | POA: Diagnosis not present

## 2015-08-29 DIAGNOSIS — Z947 Corneal transplant status: Secondary | ICD-10-CM | POA: Diagnosis not present

## 2015-09-24 ENCOUNTER — Other Ambulatory Visit: Payer: Self-pay | Admitting: Pulmonary Disease

## 2015-09-24 MED ORDER — BUDESONIDE-FORMOTEROL FUMARATE 160-4.5 MCG/ACT IN AERO: 2.0000 | INHALATION_SPRAY | Freq: Two times a day (BID) | RESPIRATORY_TRACT | Status: DC

## 2015-09-24 NOTE — Telephone Encounter (Signed)
Received paper refill request from pt's pharmacy for symbicort Refill sent electronically to pt's pharmacy  Nothing further is needed at this time.

## 2015-09-28 ENCOUNTER — Other Ambulatory Visit: Payer: Self-pay | Admitting: Pulmonary Disease

## 2015-10-05 ENCOUNTER — Encounter: Payer: Self-pay | Admitting: Pulmonary Disease

## 2015-10-05 NOTE — Telephone Encounter (Signed)
Dr. Lenna Gilford, pt cannot afford Symbicort inhaler and is requesting an alternative.  Please advise on an alternative for pt.  Thanks!

## 2015-10-09 MED ORDER — FLUTICASONE-SALMETEROL 115-21 MCG/ACT IN AERO: 2.0000 | INHALATION_SPRAY | Freq: Two times a day (BID) | RESPIRATORY_TRACT | Status: DC

## 2015-10-09 NOTE — Telephone Encounter (Signed)
Per SN>> Change medication to Advair 115 HFA with instructions to take 2 puffs twice daily. Notify pt of change   Order sent to pt's pharmacy and pt notified via mychart.  Nothing further is needed

## 2015-10-10 DIAGNOSIS — Z23 Encounter for immunization: Secondary | ICD-10-CM | POA: Diagnosis not present

## 2015-10-12 ENCOUNTER — Other Ambulatory Visit: Payer: Self-pay | Admitting: Cardiology

## 2015-10-17 ENCOUNTER — Encounter: Payer: Self-pay | Admitting: Pulmonary Disease

## 2015-10-25 ENCOUNTER — Encounter: Payer: Self-pay | Admitting: Pulmonary Disease

## 2015-10-26 MED ORDER — BENZONATATE 200 MG PO CAPS: 200.0000 mg | ORAL_CAPSULE | Freq: Three times a day (TID) | ORAL | Status: DC | PRN

## 2015-10-26 NOTE — Telephone Encounter (Signed)
Per MW call in tess 200 mg TID PRN. RX has been sent in and pt advice sent to pt. Nothing further needed

## 2015-10-26 NOTE — Telephone Encounter (Signed)
MW please advise which dosage of tess pearls? Looked on past med list and do not see this in recent history. thanks

## 2015-10-26 NOTE — Telephone Encounter (Signed)
Please advise MW in SN absence thanks

## 2015-10-31 DIAGNOSIS — Z947 Corneal transplant status: Secondary | ICD-10-CM | POA: Diagnosis not present

## 2015-10-31 DIAGNOSIS — H1851 Endothelial corneal dystrophy: Secondary | ICD-10-CM | POA: Diagnosis not present

## 2015-11-01 ENCOUNTER — Encounter: Payer: Self-pay | Admitting: Cardiology

## 2015-11-01 ENCOUNTER — Ambulatory Visit (INDEPENDENT_AMBULATORY_CARE_PROVIDER_SITE_OTHER): Payer: Medicare Other | Admitting: Cardiology

## 2015-11-01 VITALS — BP 110/72 | HR 68 | Ht 62.5 in | Wt 145.1 lb

## 2015-11-01 DIAGNOSIS — I119 Hypertensive heart disease without heart failure: Secondary | ICD-10-CM

## 2015-11-01 DIAGNOSIS — I340 Nonrheumatic mitral (valve) insufficiency: Secondary | ICD-10-CM | POA: Diagnosis not present

## 2015-11-01 DIAGNOSIS — E7800 Pure hypercholesterolemia, unspecified: Secondary | ICD-10-CM | POA: Diagnosis not present

## 2015-11-01 DIAGNOSIS — M62838 Other muscle spasm: Secondary | ICD-10-CM

## 2015-11-01 NOTE — Patient Instructions (Signed)
Medication Instructions:  Your physician recommends that you continue on your current medications as directed. Please refer to the Current Medication list given to you today.  Labwork: LP/BMET/HFP   Testing/Procedures: NONE  Follow-Up: Your physician wants you to follow-up in: [place] will receive a reminder letter in the mail two months in advance. If you don't receive a letter, please call our office to schedule the follow-up appointment.  If you need a refill on your cardiac medications before your next appointment, please call your pharmacy.

## 2015-11-01 NOTE — Progress Notes (Signed)
Cardiology Office Note   Date:  11/01/2015   ID:  Tina Mercado, DOB [birth year], MRN [id number]  PCP:  Delman Cheadle, MD  Cardiologist: Darlin Coco MD  Chief Complaint  Patient presents with  . Follow-up    denies cp/le edema  c/o sob unchanged since last visit      History of Present Illness: Tina Mercado is a 82 y.o. female who presents for a scheduled follow-up visit  This pleasant 82 year old, Caucasian female is seen for a four-month followup office visit. She has been widowed since June of 2010. The patient has a history of essential hypertension. She also has a history of mitral valve disease with atrial arrhythmias. She has a history of essential hypertension with diastolic dysfunction. Since last visit she has stopped her ACE inhibitor and is on just a small dose of daily HCTZ. She takes either 6.25 mg or 12.5 mg pending on her blood pressure. Many days she does not have to take anything at all. She states it is been several weeks since she took any blood pressure medicine at all. She checks her blood pressure and it has been staying normal. She brought in a list of her blood pressures today which are excellent and are in a normal range with very little variation from day-to-day Since last visit she did have surgery on her right eye on 04/24/2015.  He takes a full year to know if the surgery was successful or not.  So far there is no evidence of rejection. She has not been getting any regular exercise but now that her is better she is considering rejoining [employer]. She denies any chest discomfort or shortness of breath or palpitations. She has had some leg cramps at night.  These usually occur on the days that she has not drunk enough water She has been diagnosed as having mild emphysema and sees Dr. Lenna Gilford who has her on Symbicort..  Past Medical History  Diagnosis Date  . Hypertension   . Mitral regurgitation     mild to moderate  . History of anemia    . History of diastolic dysfunction   . Arrhythmia   . Heart murmur   . Hyperlipidemia   . Reflux   . History of seasonal allergies   . Allergy   . Cataract   . Glaucoma   . Osteoporosis     Past Surgical History  Procedure Laterality Date  . Corneal transplant  july 2007  . Tonsillectomy and adenoidectomy      age 37yrs  . Appendectomy    . Childbirth      x 1  . Breast surgery    . Eye surgery       Current Outpatient Prescriptions  Medication Sig Dispense Refill  . aspirin EC 81 MG tablet Take 81 mg by mouth daily.      . benzonatate (TESSALON) 200 MG capsule Take 1 capsule (200 mg total) by mouth 3 (three) times daily as needed for cough. 30 capsule 0  . bromfenac (XIBROM) 0.09 % ophthalmic solution Place 1 drop into the right eye daily.     . cholecalciferol (VITAMIN D) 1000 UNITS tablet Take 2,000 Units by mouth daily.     Mariane Baumgarten Calcium (STOOL SOFTENER PO) Take 1 capsule by mouth as needed (stool softener.).     Marland Kitchen esomeprazole (NEXIUM) 40 MG capsule TAKE 1 CAPSULE (40 MG TOTAL) BY MOUTH DAILY BEFORE SUPPER. 90 capsule 1  . fluticasone-salmeterol (ADVAIR HFA)  115-21 MCG/ACT inhaler Inhale 2 puffs into the lungs 2 (two) times daily. 1 Inhaler 12  . hydrochlorothiazide (MICROZIDE) 12.5 MG capsule Take 6.25 mg by mouth daily as needed (for blood pressure).    Marland Kitchen ibuprofen (ADVIL,MOTRIN) 200 MG tablet Take 200 mg by mouth as needed for pain (for arthritis pain).    . methocarbamol (ROBAXIN) 500 MG tablet Take 1 tablet (500 mg total) by mouth every 6 (six) hours as needed for muscle spasms. 50 tablet 0  . naproxen sodium (ANAPROX) 220 MG tablet Take 220 mg by mouth 2 (two) times daily as needed (pain.).     Marland Kitchen prednisoLONE acetate (OMNIPRED) 1 % ophthalmic suspension Place 1 drop into the right eye 4 (four) times daily.     . rosuvastatin (CRESTOR) 10 MG tablet TAKE 1 TABLET BY MOUTH EVERY DAY 90 tablet 1  . Spacer/Aero-Holding Chambers (AEROCHAMBER MV) inhaler Use as  instructed 1 each 0  . timolol (BETIMOL) 0.5 % ophthalmic solution Place 1 drop into both eyes 2 (two) times daily.     No current facility-administered medications for this visit.    Allergies:   Antihistamines, diphenhydramine-type; Celebrex; Darvocet; Demerol; Diphenhydramine hcl; Feldene; Lisinopril; Loratadine; Meperidine; Propoxyphene; and Iodine-131    Social History:  The patient  reports that she has never smoked. She has never used smokeless tobacco. She reports that she does not drink alcohol or use illicit drugs.   Family History:  The patient's family history includes COPD in her brother; Emphysema in her brother and father; Heart disease in her mother.    ROS:  Please see the history of present illness.   Otherwise, review of systems are positive for none.   All other systems are reviewed and negative.    PHYSICAL EXAM: VS:  BP 110/72 mmHg  Pulse 68  Ht 5' 2.5" (1.588 m)  Wt 145 lb 1.9 oz (65.826 kg)  BMI 26.10 kg/m2  SpO2 97% , BMI Body mass index is 26.1 kg/(m^2). GEN: Well nourished, well developed, in no acute distress HEENT: normal Neck: no JVD, carotid bruits, or masses Cardiac: RRR; no murmurs, rubs, or gallops,no edema  Respiratory:  clear to auscultation bilaterally, normal work of breathing GI: soft, nontender, nondistended, + BS MS: no deformity or atrophy Skin: warm and dry, no rash Neuro:  Strength and sensation are intact Psych: euthymic mood, full affect   EKG:  EKG is not ordered today.    Recent Labs: 07/04/2015: ALT 23; BUN 18; Creatinine, Ser 1.05; Potassium 4.6; Sodium 137    Lipid Panel    Component Value Date/Time   CHOL 165 07/04/2015 1351   TRIG 166.0* 07/04/2015 1351   HDL 48.90 07/04/2015 1351   CHOLHDL 3 07/04/2015 1351   VLDL 33.2 07/04/2015 1351   LDLCALC 82 07/04/2015 1351      Wt Readings from Last 3 Encounters:  11/01/15 145 lb 1.9 oz (65.826 kg)  07/04/15 147 lb 12.8 oz (67.042 kg)  06/05/15 147 lb 12.8 oz (67.042  kg)       ASSESSMENT AND PLAN:  1. essential hypertension, stable on only occasional HCTZ now 2. mild mitral regurgitation.  No symptoms of CHF. 3. hypercholesterolemia on Crestor.  No myalgias. 4. 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  The patient does not have concerns regarding medicines.  The following changes have been made:  no change  Labs/ tests ordered today include:   Orders Placed  This Encounter  Procedures  . Lipid panel  . Hepatic function panel  . Basic metabolic panel    Disposition: We are checking fasting lab work today including lipid panel hepatic function panel and basal metabolic panel.  Continue current medication.  Recheck in 4 months for office visit and EKG with Dr. Stanford Breed at [facility], Darlin Coco MD 11/01/2015 4:38 PM    [address] [place], [place], [place]  [address] Phone: [phone]; Fax: [phone]

## 2015-11-02 ENCOUNTER — Telehealth: Payer: Self-pay | Admitting: Cardiology

## 2015-11-02 LAB — HEPATIC FUNCTION PANEL
ALBUMIN: 3.9 g/dL (ref 3.6–5.1)
ALK PHOS: 72 U/L (ref 33–130)
ALT: 13 U/L (ref 6–29)
AST: 16 U/L (ref 10–35)
Bilirubin, Direct: 0.1 mg/dL (ref <=0.2)
Indirect Bilirubin: 0.5 mg/dL (ref 0.2–1.2)
Total Bilirubin: 0.6 mg/dL (ref 0.2–1.2)
Total Protein: 7.1 g/dL (ref 6.1–8.1)

## 2015-11-02 LAB — LIPID PANEL
CHOL/HDL RATIO: 3.8 ratio (ref <=5.0)
CHOLESTEROL: 163 mg/dL (ref 125–200)
HDL: 43 mg/dL — AB (ref >=46)
LDL Cholesterol: 90 mg/dL (ref <130)
Triglycerides: 152 mg/dL — ABNORMAL HIGH (ref <150)
VLDL: 30 mg/dL (ref <30)

## 2015-11-02 LAB — BASIC METABOLIC PANEL
BUN: 14 mg/dL (ref 7–25)
CO2: 23 mmol/L (ref 20–31)
CREATININE: 0.91 mg/dL — AB (ref 0.60–0.88)
Calcium: 9.4 mg/dL (ref 8.6–10.4)
Chloride: 105 mmol/L (ref 98–110)
Glucose, Bld: 103 mg/dL — ABNORMAL HIGH (ref 65–99)
Potassium: 5.1 mmol/L (ref 3.5–5.3)
SODIUM: 137 mmol/L (ref 135–146)

## 2015-11-02 NOTE — Telephone Encounter (Signed)
Advised patient of lab results  

## 2015-11-02 NOTE — Telephone Encounter (Signed)
-----   Message from Darlin Coco, MD sent at 11/02/2015  7:55 AM EST ----- Please report to patient.  The recent labs are stable. Continue same medication and careful diet.

## 2015-11-02 NOTE — Telephone Encounter (Signed)
Follow Up ° °Pt returned call for labs °

## 2015-11-02 NOTE — Progress Notes (Signed)
Quick Note:  Please report to patient. The recent labs are stable. Continue same medication and careful diet. ______ 

## 2015-12-05 DIAGNOSIS — Z947 Corneal transplant status: Secondary | ICD-10-CM | POA: Diagnosis not present

## 2015-12-05 DIAGNOSIS — H1851 Endothelial corneal dystrophy: Secondary | ICD-10-CM | POA: Diagnosis not present

## 2015-12-06 ENCOUNTER — Encounter: Payer: Self-pay | Admitting: Pulmonary Disease

## 2015-12-06 ENCOUNTER — Ambulatory Visit (INDEPENDENT_AMBULATORY_CARE_PROVIDER_SITE_OTHER): Payer: Medicare Other | Admitting: Pulmonary Disease

## 2015-12-06 VITALS — BP 130/78 | HR 70 | Temp 97.0°F | Ht 62.5 in | Wt 148.6 lb

## 2015-12-06 DIAGNOSIS — K219 Gastro-esophageal reflux disease without esophagitis: Secondary | ICD-10-CM

## 2015-12-06 DIAGNOSIS — J387 Other diseases of larynx: Secondary | ICD-10-CM

## 2015-12-06 DIAGNOSIS — Z8679 Personal history of other diseases of the circulatory system: Secondary | ICD-10-CM | POA: Diagnosis not present

## 2015-12-06 DIAGNOSIS — I340 Nonrheumatic mitral (valve) insufficiency: Secondary | ICD-10-CM

## 2015-12-06 DIAGNOSIS — I119 Hypertensive heart disease without heart failure: Secondary | ICD-10-CM

## 2015-12-06 DIAGNOSIS — J449 Chronic obstructive pulmonary disease, unspecified: Secondary | ICD-10-CM | POA: Diagnosis not present

## 2015-12-06 NOTE — Patient Instructions (Signed)
Today we updated your med list in our EPIC system...    Continue your current medications the same...  Stay as active as possible...  Call for any questions...  Let's plan a follow up visit in 6-86mo, sooner if needed for problems.Marland KitchenMarland Kitchen

## 2015-12-06 NOTE — Progress Notes (Signed)
Subjective:     Patient ID: Tina Mercado, female   DOB: Nov 09, 1928, 82 y.o.   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, retired [employer] nurse, referred by [employer] for a pulmonary evaluation due to dyspnea>  Tina Mercado is a never smoker and about 4 years ago she recalls a bad bout of bronchitis and CXR at the time indicated an overinflated chest c/w COPD; after this episode she noticed increasing SOB/DOE and had several evaluations at [facility] for repeated bronchitic infections- usually treated w/ antibiotics and improved but she's had gradual increased DOE to the point she notes dyspnea walking to the mailbox & back (has to rest but recovers quickly); notes that she does ok if she goes slowly but incr SOB is rushing or on an incline; she notes min cough (dry tickle), no sput, no blood, & she thinks allergy related w/ some dripping/ sneezing (but intol to antihist "they raise my BP"); states that her daughter has heard her wheeze; she has never been told to have asthma, tb or exposure, etc; she denies any occupational exposures in her past;  While she is a never smoker she notes +2nd hand smoke exposure in her life- Father died at 28 w/ emphysema but he was only a mild smoker per pt; one brother also has emphysema and smoked as well; she notes her daughter has COPD but only smoked a little she says...       She is followed by DrBrackbill for many yrs> HBP, mitral valve dis (mildMR), DD, atrial arrhythmias, & HL; prev on ACE inhib but this was stopped in the last several months; there is no 2DEcho in Epic to review; current meds= ASA81, HCT12.5- 1/2 tab daily, Cres10...      Other medical problems include visual difficulties w/ several corneal procedures & she is on several eye drops and facing a corneal transplant soon by [facility] at [facility] (there are no records in [facility]);  She also saw ENT w/ hoarseness felt to be due to reflux, tried Nexium but didn't stick w/  it; she denies much heartburn, dysphagia, choking, etc; CXRs have shown a large HH & she has never been on a vigorous antireflux regimen or seen GI for this...       EXAM showed Afeb, VSS, O2sat=97% on RA;  HEENT- neg, mallampati2;  Chest- decr BS at bases but clear w/o w/r/r;  Heart- RR Gr1/6SEM w/o r/g;  Abd- soft, nontender;  Ext- neg w/o c/c/e...  CXR 10/2012 showed norm heart size, tortuous Ao, mod sized HH, hyperinflation, clear, NAD  CXR 04/10/15 showed norm heart size, atherosclerotic changes in Ao, COPD/emphysema, mod sized HH seen, scarring left base w/o change, NAD...   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  Ambulatory oxygen saturation test 04/10/15> O2sat=97% on RA at rest w/ pulse=73/min; she ambulated 3 laps in the office w/ lowest O2sat=94% w/ pulse=107/min...  LABS 6/16:  Alpha-1-Antitrypsin level= 146 & enzyme phenotype= MM     IMP/PLAN>>  Tina Mercado had mod severe COPD, GOLD stage 2-3 by PFTs, and this is quite remarkable in a never smoker!  We will check an Alpha-1-AT level & phenotype since several family members also had COPD/emphysema and some where only mild smokers;  We will start SYMBICORT160- 2spBid and plan f/u w/ Full PFTs later  to check LVs and DLCO, consider adding an anticholinergic later;  She also has a signif HH on her CXR & I feel it would be prudent to place her on a vigorous antireflux regimen- Nexium40 before dinner, NPO after dinner, elev HOB 6", & Pepcid Qhs... She has corneal surg coming up soon, we will plan recheck in 4-6weeks.  ~  June 05, 2015:  52mo ROV w/ SN>  During our initial evaluation 04/10/15 Tina Mercado showed evid of mod obstructive lung dis (GOLD Stage2-3) despite being a never smoker; she also had a large HH & we reviewed needed antireflux regimen + starting SYMBICORT160-2spBid;  She reports improvement on the inhaler via  aerochamber- she is less SOB & able to walk farther she says, do her housework etc; she notes min cough, no sput or hemoptysis, no CP etc... she had a corneal transplant in the interval ([name]) and she has been given permission to start exercising...     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 A1AT level was norm & phenotype MM; she feels better on the ICS/LABA...    Mod hiatus hernia on CXR> on vigorous antireflux regimen w/ Nexium, NPO after dinner, elev HOB, pepcid Qhs... EXAM showed Afeb, VSS, O2sat=97% on RA;  HEENT- neg, mallampati2;  Chest- decr BS at bases but clear w/o w/r/r;  Heart- RR Gr1/6SEM w/o r/g;  Abd- soft, nontender;  Ext- neg w/o c/c/e... IMP/PLAN>>  Tina Mercado is feeling better on the Symbicort & antireflux regimen- encouraged to continue this therapy regularly; needs to check w/ her PCP to be sure she is up to date on all needed vaccinations; try to avoid infections/ exacerbations & we will plan routine ROV in about 71mo...  ~  December 06, 2015:  82mo ROV w/ SN>   Tina Mercado reports that she is stable & is turning 87 this wk, breathing about the same; she had a URI around Christmas & used Tessalon & OTC Mucinex w/ relief; she is still too sedentary 7 we discussed the need to join an exercise program for regular exercise & to incr her stamina & improve her breathing (she says she will join [name])...     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 A1AT level was norm & phenotype MM; she feels better on the ICS/LABA...    She had f/u DrBrackbill for Cards 11/01/15> HBP, mitral valve dis & atrial arrhythmias, diastolic dysfunction, & HL on Crestor10; on ASA81 & HCT which she takes prn; stable- no changes made...    Mod hiatus hernia on CXR> on vigorous antireflux regimen w/ Nexium, NPO after dinner, elev HOB, pepcid Qhs; she is  encouraged to continue vigorous antireflux regimen due to the potential to worsen her COPD/ breathing... EXAM showed Afeb, VSS, O2sat=97% on RA;  HEENT- neg, mallampati2;  Chest- decr BS at bases but clear w/o w/r/r;  Heart- RR Gr1/6SEM w/o r/g;  Abd- soft, nontender;  Ext- neg w/o c/c/e...  LABS 10/2015 in Epic>  [place]- wnl IMP/PLAN>>  Tina Mercado appears stable w/ her GOLD Stage 2-3 COPD & she is rec to continue the Advair regularly & get into a better exercise program; be sure she is up to date on all indicated adult vaccinations; We will plan a routine f/u in 53mo.    Past Medical History  Diagnosis Date  . Hypertension >> on HCT12.5mg - taking 1/2 tab prn   .  Mitral regurgitation     mild tomoderate  . History of anemia   . History of diastolic dysfunction   . Arrhythmia   . Heart murmur   . Hyperlipidemia >> on Crestor10/d   . Reflux/ HH seen on CXR >> on Nexium40, antireflux regimen, Pepcid   . History of seasonal allergies   . Allergy   . Cataract   . Glaucoma   . Osteoporosis     Past Surgical History  Procedure Laterality Date  . Corneal transplant  july 2007  . Tonsillectomy and adenoidectomy      age 66yrs  . Appendectomy    . Childbirth      x 1  . Breast surgery    . Eye surgery      Outpatient Encounter Prescriptions as of 12/06/2015  Medication Sig  . amoxicillin (AMOXIL) 500 MG tablet Take 500 mg by mouth. BEFORE DENTAL PROCEDURES .  Marland Kitchen aspirin EC 81 MG tablet Take 81 mg by mouth daily.    . bromfenac (XIBROM) 0.09 % ophthalmic solution Place 1 drop into the right eye daily.   . cholecalciferol (VITAMIN D) 1000 UNITS tablet Take 2,000 Units by mouth daily.   Tina Mercado Calcium (STOOL SOFTENER PO) Take 1 capsule by mouth as needed (stool softener.).   Marland Kitchen esomeprazole (NEXIUM) 40 MG capsule TAKE 1 CAPSULE (40 MG TOTAL) BY MOUTH DAILY BEFORE SUPPER.  . fluticasone-salmeterol (ADVAIR HFA) 115-21 MCG/ACT inhaler Inhale 2 puffs into the lungs 2 (two) times daily.  Marland Kitchen  ibuprofen (ADVIL,MOTRIN) 200 MG tablet Take 200 mg by mouth as needed for pain (for arthritis pain).  . methocarbamol (ROBAXIN) 500 MG tablet Take 1 tablet (500 mg total) by mouth every 6 (six) hours as needed for muscle spasms.  . naproxen sodium (ANAPROX) 220 MG tablet Take 220 mg by mouth 2 (two) times daily as needed (pain.).   Marland Kitchen prednisoLONE acetate (OMNIPRED) 1 % ophthalmic suspension Place 1 drop into the right eye 4 (four) times daily.   . rosuvastatin (CRESTOR) 10 MG tablet TAKE 1 TABLET BY MOUTH EVERY DAY  . Spacer/Aero-Holding Chambers (AEROCHAMBER MV) inhaler Use as instructed  . timolol (BETIMOL) 0.5 % ophthalmic solution Place 1 drop into both eyes 2 (two) times daily.  . hydrochlorothiazide (MICROZIDE) 12.5 MG capsule Take 6.25 mg by mouth daily as needed (for blood pressure). Reported on 12/06/2015  . [DISCONTINUED] benzonatate (TESSALON) 200 MG capsule Take 1 capsule (200 mg total) by mouth 3 (three) times daily as needed for cough.   No facility-administered encounter medications on file as of 12/06/2015.    Allergies  Allergen Reactions  . Antihistamines, Diphenhydramine-Type Other (See Comments)    Increases blood pressure   . Celebrex [Celecoxib]     edema  . Darvocet [Propoxyphene N-Acetaminophen]     nausea  . Demerol     nausea  . Diphenhydramine Hcl Other (See Comments)    tachycardia  . Feldene [Piroxicam]     edema  . Lisinopril     Hoarseness   . Loratadine Other (See Comments)    Makes BP go Up  . Meperidine Nausea And Vomiting    nausea  . Propoxyphene Other (See Comments)    dizziness  . Iodine-131 Rash    IVP dye    Current Medications, Allergies, Past Medical History, Past Surgical History, Family History, and Social History were reviewed in [employer] record.   Review of Systems  All symptoms NEG except where BOLDED >>  Constitutional:  F/C/S, fatigue, anorexia, unexpected weight change. HEENT:  HA, visual  changes, hearing loss, earache, nasal symptoms, sore throat, mouth sores, hoarseness. Resp:  cough, sputum, hemoptysis; SOB, tightness, wheezing. Cardio:  CP, palpit, DOE, orthopnea, edema. GI:  N/V/D/C, blood in stool; reflux, abd pain, distention, gas. GU:  dysuria, freq, urgency, hematuria, flank pain, voiding difficulty. MS:  joint pain, swelling, tenderness, decr ROM; neck pain, back pain, etc. Neuro:  HA, tremors, seizures, dizziness, syncope, weakness, numbness, gait abn. Skin:  suspicious lesions or skin rash. Heme:  adenopathy, bruising, bleeding. Psyche:  confusion, agitation, sleep disturbance, hallucinations, anxiety, depression suicidal.   Objective:   Physical Exam      Vital Signs:  Reviewed...  General:  WD, WN, 82 y/o WF in NAD; alert & oriented; pleasant & cooperative... HEENT:  [place]/AT; Conjunctiva- pink, Sclera- nonicteric, EOM-wnl, Corneal problems, EACs-clear, TMs-wnl; NOSE-clear; THROAT-clear & wnl. Neck:  Supple w/ fair ROM; no JVD; normal carotid impulses w/o bruits; no thyromegaly or nodules palpated; no lymphadenopathy. Chest:  Decr BS at bases but clear w/o w/r/r Heart:  Regular Rhythm; without murmurs, rubs, or gallops detected. Abdomen:  Soft & nontender- no guarding or rebound; normal bowel sounds; no organomegaly or masses palpated. Ext:  Normal ROM; without deformities or arthritic changes; no varicose veins, +venous insuffic, no edema;  Pulses intact w/o bruits. Neuro:  No focal neuro deficitsl; gait normal & balance OK. Derm:  No lesions noted; no rash etc. Lymph:  No cervical, supraclavicular, axillary, or inguinal adenopathy palpated.   Assessment:      IMP >>     COPD> continue Advair115-2spBid    Mod hiatus hernia on CXR> continue vigorous antireflux regimen w/ Nexium, NPO after dinner, elev HOB, pepcid...  PLAN>>   8/9>  Tina Mercado is feeling better on the Symbicort & antireflux regimen- encouraged to continue this therapy regularly; needs to check  w/ her PCP to be sure she is up to date on all needed vaccinations; try to avoid infections/ exacerbations & we will plan routine ROV in about 40mo. 2/9>  Tina Mercado appears stable w/ her GOLD Stage 2-3 COPD & she is rec to continue the Advair regularly & get into a better exercise program; be sure she is up to date on all indicated adult vaccinations; We will plan a routine f/u in 41mo.     Plan:     Patient's Medications  New Prescriptions   No medications on file  Previous Medications   AMOXICILLIN (AMOXIL) 500 MG TABLET    Take 500 mg by mouth. BEFORE DENTAL PROCEDURES .   ASPIRIN EC 81 MG TABLET    Take 81 mg by mouth daily.     BROMFENAC (XIBROM) 0.09 % OPHTHALMIC SOLUTION    Place 1 drop into the right eye daily.    CHOLECALCIFEROL (VITAMIN D) 1000 UNITS TABLET    Take 2,000 Units by mouth daily.    DOCUSATE CALCIUM (STOOL SOFTENER PO)    Take 1 capsule by mouth as needed (stool softener.).    ESOMEPRAZOLE (NEXIUM) 40 MG CAPSULE    TAKE 1 CAPSULE (40 MG TOTAL) BY MOUTH DAILY BEFORE SUPPER.   FLUTICASONE-SALMETEROL (ADVAIR HFA) 115-21 MCG/ACT INHALER    Inhale 2 puffs into the lungs 2 (two) times daily.   HYDROCHLOROTHIAZIDE (MICROZIDE) 12.5 MG CAPSULE    Take 6.25 mg by mouth daily as needed (for blood pressure). Reported on 12/06/2015   IBUPROFEN (ADVIL,MOTRIN) 200 MG TABLET  Take 200 mg by mouth as needed for pain (for arthritis pain).   METHOCARBAMOL (ROBAXIN) 500 MG TABLET    Take 1 tablet (500 mg total) by mouth every 6 (six) hours as needed for muscle spasms.   NAPROXEN SODIUM (ANAPROX) 220 MG TABLET    Take 220 mg by mouth 2 (two) times daily as needed (pain.).    PREDNISOLONE ACETATE (OMNIPRED) 1 % OPHTHALMIC SUSPENSION    Place 1 drop into the right eye 4 (four) times daily.    ROSUVASTATIN (CRESTOR) 10 MG TABLET    TAKE 1 TABLET BY MOUTH EVERY DAY   SPACER/AERO-HOLDING CHAMBERS (AEROCHAMBER MV) INHALER    Use as instructed   TIMOLOL (BETIMOL) 0.5 % OPHTHALMIC SOLUTION    Place 1  drop into both eyes 2 (two) times daily.  Modified Medications   No medications on file  Discontinued Medications   BENZONATATE (TESSALON) 200 MG CAPSULE    Take 1 capsule (200 mg total) by mouth 3 (three) times daily as needed for cough.

## 2015-12-19 ENCOUNTER — Encounter: Payer: Self-pay | Admitting: Pulmonary Disease

## 2015-12-19 DIAGNOSIS — R05 Cough: Secondary | ICD-10-CM

## 2015-12-19 DIAGNOSIS — R059 Cough, unspecified: Secondary | ICD-10-CM

## 2015-12-20 ENCOUNTER — Ambulatory Visit (INDEPENDENT_AMBULATORY_CARE_PROVIDER_SITE_OTHER): Payer: Medicare Other | Admitting: Acute Care

## 2015-12-20 ENCOUNTER — Encounter: Payer: Self-pay | Admitting: Acute Care

## 2015-12-20 ENCOUNTER — Ambulatory Visit (INDEPENDENT_AMBULATORY_CARE_PROVIDER_SITE_OTHER)
Admission: RE | Admit: 2015-12-20 | Discharge: 2015-12-20 | Disposition: A | Discharge status: Home / Self Care | Payer: Medicare Other | Source: Ambulatory Visit | Attending: Acute Care | Admitting: Acute Care

## 2015-12-20 VITALS — BP 112/78 | HR 75 | Temp 97.8°F | Ht 62.5 in | Wt 148.0 lb

## 2015-12-20 DIAGNOSIS — K219 Gastro-esophageal reflux disease without esophagitis: Secondary | ICD-10-CM

## 2015-12-20 DIAGNOSIS — J387 Other diseases of larynx: Secondary | ICD-10-CM

## 2015-12-20 DIAGNOSIS — R05 Cough: Secondary | ICD-10-CM

## 2015-12-20 DIAGNOSIS — R059 Cough, unspecified: Secondary | ICD-10-CM

## 2015-12-20 DIAGNOSIS — R0602 Shortness of breath: Secondary | ICD-10-CM | POA: Diagnosis not present

## 2015-12-20 DIAGNOSIS — J449 Chronic obstructive pulmonary disease, unspecified: Secondary | ICD-10-CM

## 2015-12-20 MED ORDER — METHYLPREDNISOLONE ACETATE 80 MG/ML IJ SUSP
80.0000 mg | Freq: Once | INTRAMUSCULAR | Status: AC
Start: 2015-12-20 — End: 2015-12-20
  Administered 2015-12-20: 80 mg via INTRAMUSCULAR

## 2015-12-20 NOTE — Assessment & Plan Note (Addendum)
Cough and nasal congestion of 8 days duration. Expiratory Wheezing with increased shortness of breath. Clear secretions, afebrile. Concern of increased intra-ocular pressure with prednisone taper, so prefers Depo Medrol injection  She is currently using opthalmic steroid drops QID for partial thickness lens transplant.  Plan: Depo Medrol 80 mg injection today for your wheezing. Saline mist for your nose or AYR saline gel. Continue using your Mucinex as you have been doing. Offered Hydromet Syrup, but patient declined. Please call if you are not getting better so we can get you into the office or call in a prescription. If your secretions change color please call the office. Drink sips of water instead of clearing your throat Sugarless hard candy to sooth your throat. No mints or menthol. Follow up in 2 weeks to make sure your symptoms have resolved. Please contact office for sooner follow up if symptoms do not improve or worsen or seek emergency care

## 2015-12-20 NOTE — Progress Notes (Signed)
   Subjective:    Patient ID: Tina Mercado, female    DOB: 1929/04/30, 82 y.o.   MRN: [id number]  HPI 82 year old ,never smoker, female patient of Dr. Lenna Gilford with Moderate to Severe COPD Gold Stage 2-3 per PFT's who is maintained on Advair 115/21 two puffs twice daily, and Aggressive  Anti reflux regimen for hiatal hernia. Last office visit 12/06/15 for regular check up.  Significant Events/Procedures:  04/18/15: A-1 Antitrypsin 83 - 199 mg/dL 146        12/06/15: Regular Follow Up with Dr. Lenna Gilford  12/20/15:  EXAM: CHEST 2 VIEW  COMPARISON: 10/07/2014.  FINDINGS: Emphysema. Moderate-sized hiatal hernia. Scarring at the LEFT lung base extending to the LEFT lower lobe appears unchanged compared to prior. Aortic arch atherosclerosis and tortuosity. The cardiopericardial silhouette is within normal limits. No airspace disease. No pleural effusion.  IMPRESSION: 1. Emphysema without acute cardiopulmonary disease. 2. Moderate hiatal hernia and chronic pulmonary parenchymal scarring.    12/20/15 Acute Visit:  Pt presents to the office today with a cough and sinus congestion that started 8 days ago. She has been using Mucinex, but her cough has been persistent. She denies fever, and all secretions from her nose/ chest have been clear. She states that she has had increased shortness of breath with the cough, and that her oxygen saturation is running in the 93% range rather than 95% which is her baseline.She denies chest pain, orthopnea, hemoptysis or chest pain. 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     Review of Systems    Constitutional:   No  weight loss, night sweats,  Fevers, chills, fatigue, or  lassitude.  HEENT:   No headaches,  Difficulty swallowing,  Tooth/dental problems, or  Sore throat,                No sneezing, itching, ear  ache, + nasal congestion, post nasal drip,   CV:  No chest pain,  Orthopnea, PND, swelling in lower extremities, anasarca, dizziness, palpitations, syncope.   GI  No heartburn, indigestion, abdominal pain, nausea, vomiting, diarrhea, change in bowel habits, loss of appetite, bloody stools.   Resp: + shortness of breath with exertion not at rest.  scant excess mucus, no productive cough,  + non-productive cough,  No coughing up of blood.  No change in color of mucus.  + wheezing.  No chest wall deformity  Skin: no rash or lesions.  GU: no dysuria, change in color of urine, no urgency or frequency.  No flank pain, no hematuria   MS:  No joint pain or swelling.  No decreased range of motion.  No back pain.  Psych:  No change in mood or affect. No depression or anxiety.  No memory loss.     Objective:   Physical Exam   Physical Exam:  General- No distress,  A&Ox3 ENT: No sinus tenderness, TM clear, pedematous nasal mucosa, no oral exudate,no post nasal drip, no LAN Cardiac: S1, S2, regular rate and rhythm,slight murmur Chest: + wheeze/ rales/ dullness; no accessory muscle use, no nasal flaring, no sternal retractions Abd.: Soft Non-tender Ext: No clubbing cyanosis, edema Neuro:  normal strength Skin: No rashes, warm and dry Psych: normal mood and behavior       Assessment & Plan:

## 2015-12-20 NOTE — Assessment & Plan Note (Signed)
Hiatal Hernia with GERD: Aggressive Regimen per Dr. Lenna Gilford  Plan:  Continue your Nexium 40 mg before dinner Try to remain NPO after dinner. Continue your Pepcid QHS

## 2015-12-20 NOTE — Telephone Encounter (Signed)
Patient scheduled to see Eric Form, NP today at 11:15am. Per Sarah, ordered CXR and bloodwork prior to appointment. Advised front desk staff Vallarie Mare) that patient has been added to schedule and will need to be sent down for labs and Xray prior to appointment. Nothing further needed.

## 2015-12-20 NOTE — Telephone Encounter (Signed)
Patient sending sick message through MyChart: Dr. Annamaria Boots, please advise in Dr. Jeannine Kitten absence.

## 2015-12-20 NOTE — Patient Instructions (Addendum)
It is nice to meet you today. I am sorry you are not feeling well. Depo Medrol 80 mg injection today for your wheezing. Saline mist for your nose or AYR saline gel. Continue using your Mucinex as you have been doing. Please call if you are not getting better so we can get you into the office or call in a prescription. If your secretions change color please call the office. Drink sips of water instead of clearing your throat Sugarless hard candy to sooth your throat. No mints or menthol. Continue your Nexium 40 mg before dinner Try to remain NPO after dinner. Continue your Pepcid QHS Follow up in 2 weeks to make sure your symptoms have resolved. Please contact office for sooner follow up if symptoms do not improve or worsen or seek emergency care

## 2015-12-21 ENCOUNTER — Telehealth: Payer: Self-pay | Admitting: Acute Care

## 2015-12-21 MED ORDER — AZITHROMYCIN 250 MG PO TABS: 250.0000 mg | ORAL_TABLET | ORAL | Status: DC

## 2015-12-21 MED ORDER — PROMETHAZINE-CODEINE 6.25-10 MG/5ML PO SYRP: 5.0000 mL | ORAL_SOLUTION | Freq: Four times a day (QID) | ORAL | Status: DC | PRN

## 2015-12-21 NOTE — Telephone Encounter (Signed)
I called and spoke with Miss Tina Mercado. She explained that her symptoms have not changed but that she is worried because it is the weekend she should have something called in. I suggested antibiotic treatment yesterday which she refused. I have called in a Z-Pak and some promethazine syrup for cough. She did receive Depo Medrol shot yesterday. She did not want to take a prednisone taper due to the fact she has a partial-thickness lens transplant and is concerned about elevated intraocular pressure. I have called the patient and spoken with her about being careful with the codeine cough syrup, as it can make you drowsy. I counseled her not to drive while sleepy. She verbalized understanding and had no further questions upon ending the call. I told her to follow-up on Monday if she was not improved . I told her to contact office for sooner follow up if symptoms do not improve or worsen or seek emergency care . She verbalized understanding.

## 2015-12-21 NOTE — Telephone Encounter (Signed)
Please call in a z pack  Please call in Hydromet 5 cc every 6 hours as needed for cough, 120 cc's Thanks so much.

## 2015-12-21 NOTE — Telephone Encounter (Signed)
Per Judson Roch called in [place] and Prometh-Cediene cough syrup - 5cc every 6 hrs prn cough, #179mL Sarah aware that Hydromet is not able to be prescribed today as the patient would have to pick this up and its already after 5pm. Judson Roch states that she is calling the patient to make aware of this.  Nothing further needed.

## 2015-12-21 NOTE — Telephone Encounter (Signed)
Spoke with the pt  She is following all of the AVS instructions since yesterdays visit and reports not feeling any better today She is still wheezing, has DOE with minimal exertion and cough with clear sputum  She states that she is no worse since ov yesterday, and has no new symptoms, but did not want to go through the wkend w/o having something else called in She fears ending up in the ER  Please advise, thanks!  Allergies  Allergen Reactions  . Antihistamines, Diphenhydramine-Type Other (See Comments)    Increases blood pressure   . Celebrex [Celecoxib]     edema  . Darvocet [Propoxyphene N-Acetaminophen]     nausea  . Demerol     nausea  . Diphenhydramine Hcl Other (See Comments)    tachycardia  . Feldene [Piroxicam]     edema  . Lisinopril     Hoarseness   . Loratadine Other (See Comments)    Makes BP go Up  . Meperidine Nausea And Vomiting    nausea  . Propoxyphene Other (See Comments)    dizziness  . Iodine-131 Rash    IVP dye

## 2015-12-25 ENCOUNTER — Telehealth: Payer: Self-pay | Admitting: Pulmonary Disease

## 2015-12-25 NOTE — Telephone Encounter (Signed)
Per SN: tell her I looked up the 2/23 cxr in Epic > looks good, large HH, I didn't see pneumonia.  I can see her Friday 3/3 AM if she wants.  In the meantime, I recommend mucinex 1200mg  BID, increase fluids, continue Symbicort 160 2puffs BID.    Called spoke with patient and discussed SN's recs as stated above.  Pt voiced her understanding and reported that she is already following the above recommendations.  Pt feels that the zpak does not work for her but is okay with waiting for Friday 3/3 appt.  Pt is aware to contact the office if her symptoms worsen prior to ov and that if her symptoms improve, she may call to cancel the ov.  Nothing further needed; will sign off.

## 2015-12-25 NOTE — Telephone Encounter (Signed)
Spoke with pt, requesting an asap appt with SN- pt saw SG last week for same things- was given solumedrol inj, zpak. Pt declined prednisone at that time d/t glaucoma.  C/o sob with any exertion, chest tightness, wheezing, chest congestion, mostly nonprod cough X1 week approx.   Pt uses [facility] on [address].   SN please advise on recs.  Thanks.   Allergies  Allergen Reactions  . Antihistamines, Diphenhydramine-Type Other (See Comments)    Increases blood pressure   . Celebrex [Celecoxib]     edema  . Darvocet [Propoxyphene N-Acetaminophen]     nausea  . Demerol     nausea  . Diphenhydramine Hcl Other (See Comments)    tachycardia  . Feldene [Piroxicam]     edema  . Lisinopril     Hoarseness   . Loratadine Other (See Comments)    Makes BP go Up  . Meperidine Nausea And Vomiting    nausea  . Propoxyphene Other (See Comments)    dizziness  . Iodine-131 Rash    IVP dye

## 2015-12-28 ENCOUNTER — Ambulatory Visit: Payer: Medicare Other | Admitting: Pulmonary Disease

## 2016-01-04 ENCOUNTER — Ambulatory Visit (INDEPENDENT_AMBULATORY_CARE_PROVIDER_SITE_OTHER): Payer: Medicare Other | Admitting: Acute Care

## 2016-01-04 ENCOUNTER — Encounter: Payer: Self-pay | Admitting: Acute Care

## 2016-01-04 VITALS — BP 122/84 | HR 65 | Ht 62.5 in | Wt 141.0 lb

## 2016-01-04 DIAGNOSIS — J449 Chronic obstructive pulmonary disease, unspecified: Secondary | ICD-10-CM | POA: Diagnosis not present

## 2016-01-04 NOTE — Assessment & Plan Note (Signed)
Resolved COPD/ Bronchitis exacerbation  Plan:  Continue your Symbicort 160 2 puffs twice daily. Use Mucinex 1200 mg BID as needed. Drink plenty of fluids. Continue using sips of water or sugarless hard candy to soothe your throat and help with cough. Continue your nexium and GI reimen You can Try adding Zyrtec if needed for allergies. Check with your eye doctor to make sure it is all right to take these non sedating antihistamines with your glaucoma . Follow up with Dr. Lenna Gilford on 07/07/16 as scheduled. Please call us if you need [place] sooner.

## 2016-01-04 NOTE — Patient Instructions (Addendum)
It is good to see you again today. Your Oxygen Saturation today is 97%, I am glad you are feeling better. Continue your Symbicort 160 2 puffs twice daily. Use Mucinex 1200 mg BID as needed. Drink plenty of fluids. Continue using sips of water or sugarless hard candy to soothe your throat and help with cough. Continue your nexium and GI reimen You can Try adding Zyrtec if needed for allergies. Check with your eye doctor to make sure it is all right to take these non sedating antihistamines . Follow up with Dr. Lenna Gilford on 07/07/16 as scheduled. Please call us if you need [place] sooner. Please contact office for sooner follow up if symptoms do not improve or worsen or seek emergency care

## 2016-01-04 NOTE — Progress Notes (Addendum)
Subjective:    Patient ID: Tina Mercado, female    DOB: August 07, 1929, 82 y.o.   MRN: [id number]  HPI   82 year old ,never smoker, female patient of Dr. Lenna Gilford with Moderate to Severe COPD Gold Stage 2-3 per PFT's who is maintained on Symbicort two puffs twice daily, and Aggressive Anti reflux regimen for large hiatal hernia.  Significant Events/Procedures:  04/18/15:  A-1 Antitrypsin 83 - 199 mg/dL 146       Recent full thickness corneal transplant for which she is taking steroid drops 4 times daily. Recent history of 2 Viral illnesses in December and February.Both have been slow to resolve.  12/20/15:  EXAM: CHEST 2 VIEW  COMPARISON: 10/07/2014.  FINDINGS: Emphysema. Moderate-sized hiatal hernia. Scarring at the LEFT lung base extending to the LEFT lower lobe appears unchanged compared to prior. Aortic arch atherosclerosis and tortuosity. The cardiopericardial silhouette is within normal limits. No airspace disease. No pleural effusion.  IMPRESSION: 1. Emphysema without acute cardiopulmonary disease. 2. Moderate hiatal hernia and chronic pulmonary parenchymal scarring.        01/04/16 Follow up visit:  Patient presents to the office today, stating she is feeling much better. She is less short of breath, no longer has chest tightness, or wheezing, chest congestion has cleared, and cough is dry and non-productive. Saturations are 97% in the office today.She denies orthopnea, hemoptysis or chest pain.No recent car trips/travel.   Current outpatient prescriptions:  .  amoxicillin (AMOXIL) 500 MG tablet, Take 500 mg by mouth. BEFORE DENTAL PROCEDURES ., Disp: , Rfl:  .  aspirin EC 81 MG tablet, Take 81 mg by mouth daily.  , Disp: , Rfl:  .  benzonatate (TESSALON) 200 MG capsule, , Disp: , Rfl:  .  bromfenac (XIBROM) 0.09 % ophthalmic solution, Place 1 drop into the right eye daily. , Disp: , Rfl:  .  cholecalciferol (VITAMIN D) 1000 UNITS tablet, Take  2,000 Units by mouth daily. , Disp: , Rfl:  .  dextromethorphan-guaiFENesin (MUCINEX DM) 30-600 MG 12hr tablet, Take 1 tablet by mouth 2 (two) times daily as needed. , Disp: , Rfl:  .  Docusate Calcium (STOOL SOFTENER PO), Take 1 capsule by mouth as needed (stool softener.). , Disp: , Rfl:  .  esomeprazole (NEXIUM) 40 MG capsule, TAKE 1 CAPSULE (40 MG TOTAL) BY MOUTH DAILY BEFORE SUPPER., Disp: 90 capsule, Rfl: 1 .  fluticasone-salmeterol (ADVAIR HFA) 115-21 MCG/ACT inhaler, Inhale 2 puffs into the lungs 2 (two) times daily., Disp: 1 Inhaler, Rfl: 12 .  hydrochlorothiazide (MICROZIDE) 12.5 MG capsule, Take 6.25 mg by mouth daily as needed (for blood pressure). Reported on 12/06/2015, Disp: , Rfl:  .  ibuprofen (ADVIL,MOTRIN) 200 MG tablet, Take 200 mg by mouth as needed for pain (for arthritis pain)., Disp: , Rfl:  .  methocarbamol (ROBAXIN) 500 MG tablet, Take 1 tablet (500 mg total) by mouth every 6 (six) hours as needed for muscle spasms., Disp: 50 tablet, Rfl: 0 .  naproxen sodium (ANAPROX) 220 MG tablet, Take 220 mg by mouth 2 (two) times daily as needed (pain.). , Disp: , Rfl:  .  prednisoLONE acetate (OMNIPRED) 1 % ophthalmic suspension, Place 1 drop into the right eye 4 (four) times daily. , Disp: , Rfl:  .  promethazine-codeine (PHENERGAN WITH CODEINE) 6.25-10 MG/5ML syrup, Take 5 mLs by mouth every 6 (six) hours as needed for cough., Disp: 120 mL, Rfl: ` .  rosuvastatin (CRESTOR) 10 MG tablet, TAKE 1 TABLET BY MOUTH  EVERY DAY, Disp: 90 tablet, Rfl: 1 .  Spacer/Aero-Holding Chambers (AEROCHAMBER MV) inhaler, Use as instructed, Disp: 1 each, Rfl: 0 .  timolol (BETIMOL) 0.5 % ophthalmic solution, Place 1 drop into both eyes 2 (two) times daily., Disp: , Rfl:    Past Medical History  Diagnosis Date  . Hypertension   . Mitral regurgitation     mild to moderate  . History of anemia   . History of diastolic dysfunction   . Arrhythmia   . Heart murmur   . Hyperlipidemia   . Reflux   .  History of seasonal allergies   . Allergy   . Cataract   . Glaucoma   . Osteoporosis     Allergies  Allergen Reactions  . Antihistamines, Diphenhydramine-Type Other (See Comments)    Increases blood pressure   . Celebrex [Celecoxib]     edema  . Darvocet [Propoxyphene N-Acetaminophen]     nausea  . Demerol     nausea  . Diphenhydramine Hcl Other (See Comments)    tachycardia  . Feldene [Piroxicam]     edema  . Lisinopril     Hoarseness   . Loratadine Other (See Comments)    Makes BP go Up  . Meperidine Nausea And Vomiting    nausea  . Propoxyphene Other (See Comments)    dizziness  . Iodine-131 Rash    IVP dye     Review of Systems Constitutional:   No  weight loss, night sweats,  Fevers, chills, fatigue, or  lassitude.  HEENT:   No headaches,  Difficulty swallowing,  Tooth/dental problems, or  Sore throat,                No sneezing, itching, ear ache, nasal congestion,+ post nasal drip,   CV:  No chest pain,  Orthopnea, PND, swelling in lower extremities, anasarca, dizziness, palpitations, syncope.   GI  No heartburn, indigestion, abdominal pain, nausea, vomiting, diarrhea, change in bowel habits, loss of appetite, bloody stools.   Resp: + shortness of breath with exertion not at rest ( at her baseline).  No excess mucus, no productive cough,  + non-productive cough,  No coughing up of blood.  No change in color of mucus.  No wheezing.  No chest wall deformity  Skin: no rash or lesions.  GU: no dysuria, change in color of urine, no urgency or frequency.  No flank pain, no hematuria   MS:  No joint pain or swelling.  No decreased range of motion.  No back pain.  Psych:  No change in mood or affect. No depression or anxiety.  No memory loss.        Objective:   Physical Exam  BP 122/84 mmHg  Pulse 65  Ht 5' 2.5" (1.588 m)  Wt 141 lb (63.957 kg)  BMI 25.36 kg/m2  SpO2 97%  Physical Exam:  General- No distress,  A&Ox3, pleasant ENT: No sinus  tenderness, TM clear, pale nasal mucosa, no oral exudate,+ post nasal drip, no LAN Cardiac: S1, S2, regular rate and rhythm, no murmur Chest: No wheeze/ rales/ dullness; no accessory muscle use, no nasal flaring, no sternal retractions, continued dry cough that is non-productive. Abd.: Soft Non-tender Ext: No clubbing cyanosis, edema Neuro:  normal strength Skin: No rashes, warm and dry Psych: normal mood and behavior   Magdalen Spatz, AGACNP-BC [place] Pager # [phone]  01/04/2016    Assessment & Plan:

## 2016-01-09 DIAGNOSIS — Z947 Corneal transplant status: Secondary | ICD-10-CM | POA: Diagnosis not present

## 2016-01-09 DIAGNOSIS — Z961 Presence of intraocular lens: Secondary | ICD-10-CM | POA: Diagnosis not present

## 2016-01-29 DIAGNOSIS — D225 Melanocytic nevi of trunk: Secondary | ICD-10-CM | POA: Diagnosis not present

## 2016-01-29 DIAGNOSIS — L820 Inflamed seborrheic keratosis: Secondary | ICD-10-CM | POA: Diagnosis not present

## 2016-01-29 DIAGNOSIS — L708 Other acne: Secondary | ICD-10-CM | POA: Diagnosis not present

## 2016-01-29 DIAGNOSIS — L723 Sebaceous cyst: Secondary | ICD-10-CM | POA: Diagnosis not present

## 2016-02-12 DIAGNOSIS — L723 Sebaceous cyst: Secondary | ICD-10-CM | POA: Diagnosis not present

## 2016-02-13 DIAGNOSIS — Z947 Corneal transplant status: Secondary | ICD-10-CM | POA: Diagnosis not present

## 2016-02-13 DIAGNOSIS — Z961 Presence of intraocular lens: Secondary | ICD-10-CM | POA: Diagnosis not present

## 2016-02-13 DIAGNOSIS — H1851 Endothelial corneal dystrophy: Secondary | ICD-10-CM | POA: Diagnosis not present

## 2016-03-04 NOTE — Progress Notes (Signed)
HPI: FU hypertension and MR. Previously followed by DR Mare Ferrari. H/O mild to moderate MR although previous echos not available. Since last seen, the patient has dyspnea with more extreme activities but not with routine activities. It is relieved with rest. It is not associated with chest pain. There is no orthopnea, PND or pedal edema. There is no syncope or palpitations. There is no exertional chest pain.   Current Outpatient Prescriptions  Medication Sig Dispense Refill  . amoxicillin (AMOXIL) 500 MG tablet Take 500 mg by mouth. BEFORE DENTAL PROCEDURES .    Marland Kitchen aspirin EC 81 MG tablet Take 81 mg by mouth daily.      . benzonatate (TESSALON) 200 MG capsule     . bromfenac (XIBROM) 0.09 % ophthalmic solution Place 1 drop into the right eye daily.     . cholecalciferol (VITAMIN D) 1000 UNITS tablet Take 2,000 Units by mouth daily.     Marland Kitchen dextromethorphan-guaiFENesin (MUCINEX DM) 30-600 MG 12hr tablet Take 1 tablet by mouth 2 (two) times daily as needed.     Mariane Baumgarten Calcium (STOOL SOFTENER PO) Take 1 capsule by mouth as needed (stool softener.).     Marland Kitchen esomeprazole (NEXIUM) 40 MG capsule TAKE 1 CAPSULE (40 MG TOTAL) BY MOUTH DAILY BEFORE SUPPER. 90 capsule 1  . fluticasone-salmeterol (ADVAIR HFA) 115-21 MCG/ACT inhaler Inhale 2 puffs into the lungs 2 (two) times daily. 1 Inhaler 12  . hydrochlorothiazide (MICROZIDE) 12.5 MG capsule Take 6.25 mg by mouth daily as needed (for blood pressure). Reported on 12/06/2015    . ibuprofen (ADVIL,MOTRIN) 200 MG tablet Take 200 mg by mouth as needed for pain (for arthritis pain).    . methocarbamol (ROBAXIN) 500 MG tablet Take 1 tablet (500 mg total) by mouth every 6 (six) hours as needed for muscle spasms. 50 tablet 0  . naproxen sodium (ANAPROX) 220 MG tablet Take 220 mg by mouth 2 (two) times daily as needed (pain.).     Marland Kitchen prednisoLONE acetate (OMNIPRED) 1 % ophthalmic suspension Place 1 drop into the right eye 4 (four) times daily.     .  promethazine-codeine (PHENERGAN WITH CODEINE) 6.25-10 MG/5ML syrup Take 5 mLs by mouth every 6 (six) hours as needed for cough. 120 mL `  . rosuvastatin (CRESTOR) 10 MG tablet TAKE 1 TABLET BY MOUTH EVERY DAY 90 tablet 1  . Spacer/Aero-Holding Chambers (AEROCHAMBER MV) inhaler Use as instructed 1 each 0  . timolol (BETIMOL) 0.5 % ophthalmic solution Place 1 drop into both eyes 2 (two) times daily.     No current facility-administered medications for this visit.     Past Medical History  Diagnosis Date  . Hypertension   . Mitral regurgitation     mild to moderate  . History of anemia   . History of diastolic dysfunction   . Arrhythmia   . Heart murmur   . Hyperlipidemia   . Reflux   . History of seasonal allergies   . Allergy   . Cataract   . Glaucoma   . Osteoporosis     Past Surgical History  Procedure Laterality Date  . Corneal transplant  july 2007  . Tonsillectomy and adenoidectomy      age 10yrs  . Appendectomy    . Childbirth      x 1  . Breast surgery    . Eye surgery      Social History   Social History  . Marital Status: Single  Spouse Name: N/A  . Number of Children: N/A  . Years of Education: N/A   Occupational History  . Not on file.   Social History Main Topics  . Smoking status: Never Smoker   . Smokeless tobacco: Never Used  . Alcohol Use: No  . Drug Use: No  . Sexual Activity: No   Other Topics Concern  . Not on file   Social History Narrative    Family History  Problem Relation Age of Onset  . Heart disease Mother   . Emphysema Father   . Emphysema Brother   . COPD Brother     ROS: no fevers or chills, productive cough, hemoptysis, dysphasia, odynophagia, melena, hematochezia, dysuria, hematuria, rash, seizure activity, orthopnea, PND, pedal edema, claudication. Remaining systems are negative.  Physical Exam: Well-developed well-nourished in no acute distress.  Skin is warm and dry.  HEENT is normal.  Neck is supple.    Chest is clear to auscultation with normal expansion.  Cardiovascular exam is regular rate and rhythm.  Abdominal exam nontender or distended. No masses palpated. Extremities show no edema. neuro grossly intact  ECG Sinus rhythm with occasional PVCs. First degree AV block.Prior anterior infarct.

## 2016-03-10 ENCOUNTER — Ambulatory Visit (INDEPENDENT_AMBULATORY_CARE_PROVIDER_SITE_OTHER): Payer: Medicare Other | Admitting: Cardiology

## 2016-03-10 ENCOUNTER — Ambulatory Visit (INDEPENDENT_AMBULATORY_CARE_PROVIDER_SITE_OTHER): Payer: Medicare Other | Admitting: Family Medicine

## 2016-03-10 ENCOUNTER — Encounter: Payer: Self-pay | Admitting: Cardiology

## 2016-03-10 VITALS — BP 128/68 | HR 87 | Temp 98.6°F | Resp 16 | Ht 62.0 in | Wt 142.0 lb

## 2016-03-10 DIAGNOSIS — N9089 Other specified noninflammatory disorders of vulva and perineum: Secondary | ICD-10-CM | POA: Diagnosis not present

## 2016-03-10 DIAGNOSIS — I340 Nonrheumatic mitral (valve) insufficiency: Secondary | ICD-10-CM

## 2016-03-10 DIAGNOSIS — I059 Rheumatic mitral valve disease, unspecified: Secondary | ICD-10-CM

## 2016-03-10 DIAGNOSIS — M546 Pain in thoracic spine: Secondary | ICD-10-CM

## 2016-03-10 DIAGNOSIS — G8929 Other chronic pain: Secondary | ICD-10-CM

## 2016-03-10 DIAGNOSIS — E7800 Pure hypercholesterolemia, unspecified: Secondary | ICD-10-CM | POA: Diagnosis not present

## 2016-03-10 DIAGNOSIS — L905 Scar conditions and fibrosis of skin: Secondary | ICD-10-CM | POA: Diagnosis not present

## 2016-03-10 LAB — HEPATIC FUNCTION PANEL
ALBUMIN: 3.9 g/dL (ref 3.6–5.1)
ALT: 12 U/L (ref 6–29)
AST: 17 U/L (ref 10–35)
Alkaline Phosphatase: 67 U/L (ref 33–130)
Bilirubin, Direct: 0.1 mg/dL (ref <=0.2)
Indirect Bilirubin: 0.4 mg/dL (ref 0.2–1.2)
TOTAL PROTEIN: 6.2 g/dL (ref 6.1–8.1)
Total Bilirubin: 0.5 mg/dL (ref 0.2–1.2)

## 2016-03-10 LAB — BASIC METABOLIC PANEL
BUN: 17 mg/dL (ref 7–25)
CO2: 24 mmol/L (ref 20–31)
Calcium: 9.4 mg/dL (ref 8.6–10.4)
Chloride: 109 mmol/L (ref 98–110)
Creat: 0.96 mg/dL — ABNORMAL HIGH (ref 0.60–0.88)
GLUCOSE: 103 mg/dL — AB (ref 65–99)
POTASSIUM: 5 mmol/L (ref 3.5–5.3)
Sodium: 142 mmol/L (ref 135–146)

## 2016-03-10 LAB — LIPID PANEL
Cholesterol: 183 mg/dL (ref 125–200)
HDL: 55 mg/dL (ref >=46)
LDL Cholesterol: 104 mg/dL (ref <130)
Total CHOL/HDL Ratio: 3.3 Ratio (ref <=5.0)
Triglycerides: 118 mg/dL (ref <150)
VLDL: 24 mg/dL (ref <30)

## 2016-03-10 MED ORDER — TRAMADOL HCL 50 MG PO TABS: 50.0000 mg | ORAL_TABLET | Freq: Four times a day (QID) | ORAL | Status: DC | PRN

## 2016-03-10 NOTE — Assessment & Plan Note (Signed)
Plan repeat echocardiogram to reassess.

## 2016-03-10 NOTE — Patient Instructions (Addendum)
IF you received an x-ray today, you will receive an invoice from [facility]. Please contact [facility] at [phone] with questions or concerns regarding your invoice.   IF you received labwork today, you will receive an invoice from [employer]. Please contact [facility] at [phone] with questions or concerns regarding your invoice.   Our billing staff will not be able to assist you with questions regarding bills from these companies.  You will be contacted with the lab results as soon as they are available. The fastest way to get your results is to activate your My Chart account. Instructions are located on the last page of this paperwork. If you have not heard from [place] regarding the results in 2 weeks, please contact this office.    Bone Health Bones protect organs, store calcium, and anchor muscles. Good health habits, such as eating nutritious foods and exercising regularly, are important for maintaining healthy bones. They can also help to prevent a condition that causes bones to lose density and become weak and brittle (osteoporosis). WHY IS BONE MASS IMPORTANT? Bone mass refers to the amount of bone tissue that you have. The higher your bone mass, the stronger your bones. An important step toward having healthy bones throughout life is to have strong and dense bones during childhood. A young adult who has a high bone mass is more likely to have a high bone mass later in life. Bone mass at its greatest it is called peak bone mass. A large decline in bone mass occurs in older adults. In women, it occurs about the time of menopause. During this time, it is important to practice good health habits, because if more bone is lost than what is replaced, the bones will become less healthy and more likely to break (fracture). If you find that you have a low bone mass, you may be able to prevent osteoporosis or further bone loss by changing your diet  and lifestyle. HOW CAN I FIND OUT IF MY BONE MASS IS LOW? Bone mass can be measured with an X-ray test that is called a bone mineral density (BMD) test. This test is recommended for all women who are age 25 or older. It may also be recommended for men who are age 95 or older, or for people who are more likely to develop osteoporosis due to:  Having bones that break easily.  Having a long-term disease that weakens bones, such as kidney disease or rheumatoid arthritis.  Having menopause earlier than normal.  Taking medicine that weakens bones, such as steroids, thyroid hormones, or hormone treatment for breast cancer or prostate cancer.  Smoking.  Drinking three or more alcoholic drinks each day. WHAT ARE THE NUTRITIONAL RECOMMENDATIONS FOR HEALTHY BONES? To have healthy bones, you need to get enough of the right minerals and vitamins. Most nutrition experts recommend getting these nutrients from the foods that you eat. Nutritional recommendations vary from person to person. Ask your health care provider what is healthy for you. Here are some general guidelines. Calcium Recommendations Calcium is the most important (essential) mineral for bone health. Most people can get enough calcium from their diet, but supplements may be recommended for people who are at risk for osteoporosis. Good sources of calcium include:  Dairy products, such as low-fat or nonfat milk, cheese, and yogurt.  Dark green leafy vegetables, such as bok choy and broccoli.  Calcium-fortified foods, such as orange juice, cereal, bread, soy beverages, and tofu products.  Nuts, such as almonds. Follow these recommended amounts for daily calcium intake:  Children, age 260-3: 700 mg.  Children, age 26-8: 1,000 mg.  Children, age 67-13: 1,300 mg.  Teens, age 65-18: 1,300 mg.  Adults, age 80-50: 1,000 mg.  Adults, age 51-70:  Men: 1,000 mg.  Women: 1,200 mg.  Adults, age 32 or older: 1,200 mg.  Pregnant and  breastfeeding females:  Teens: 1,300 mg.  Adults: 1,000 mg. Vitamin D Recommendations Vitamin D is the most essential vitamin for bone health. It helps the body to absorb calcium. Sunlight stimulates the skin to make vitamin D, so be sure to get enough sunlight. If you live in a cold climate or you do not get outside often, your health care provider may recommend that you take vitamin D supplements. Good sources of vitamin D in your diet include:  Egg yolks.  Saltwater fish.  Milk and cereal fortified with vitamin D. Follow these recommended amounts for daily vitamin D intake:  Children and teens, age 260-18: 600 international units.  Adults, age 27 or younger: 400-800 international units.  Adults, age 8 or older: 800-1,000 international units. Other Nutrients Other nutrients for bone health include:  Phosphorus. This mineral is found in meat, poultry, dairy foods, nuts, and legumes. The recommended daily intake for adult men and adult women is 700 mg.  Magnesium. This mineral is found in seeds, nuts, dark green vegetables, and legumes. The recommended daily intake for adult men is 400-420 mg. For adult women, it is 310-320 mg.  Vitamin K. This vitamin is found in green leafy vegetables. The recommended daily intake is 120 mg for adult men and 90 mg for adult women. WHAT TYPE OF PHYSICAL ACTIVITY IS BEST FOR BUILDING AND MAINTAINING HEALTHY BONES? Weight-bearing and strength-building activities are important for building and maintaining peak bone mass. Weight-bearing activities cause muscles and bones to work against gravity. Strength-building activities increases muscle strength that supports bones. Weight-bearing and muscle-building activities include:  Walking and hiking.  Jogging and running.  Dancing.  Gym exercises.  Lifting weights.  Tennis and racquetball.  Climbing stairs.  Aerobics. Adults should get at least 30 minutes of moderate physical activity on most  days. Children should get at least 60 minutes of moderate physical activity on most days. Ask your health care provide what type of exercise is best for you. WHERE CAN I FIND MORE INFORMATION? For more information, check out the following websites:  [employer]: YardHomes.se  [employer] of Health: http://www.niams.AnonymousEar.fr.asp   This information is not intended to replace advice given to you by your health care provider. Make sure you discuss any questions you have with your health care provider.   Document Released: 01/03/2004 Document Revised: 02/27/2015 Document Reviewed: 10/18/2014 Elsevier Interactive Patient Education [employer].

## 2016-03-10 NOTE — Assessment & Plan Note (Signed)
Continue statin. Check lipids and liver. 

## 2016-03-10 NOTE — Progress Notes (Signed)
By signing my name below, I, Tina Mercado, attest that this documentation has been prepared under the direction and in the presence of Tina Cheadle, MD.  Electronically Signed: Verlee Mercado, Medical Scribe. 03/10/2016. 4:10 PM.  Subjective:    Patient ID: Tina Mercado, female    DOB: [birth year], 82 y.o.   MRN: [id number]  HPI Chief Complaint  Patient presents with  . skin lesion    in groin area, x 3 months    HPI Comments: Tina Mercado is a 82 y.o. female who presents to the [facility] complaining of skin lesion in her groin area onset 3-4 months. Pt describes the sensation as a deep itchiness. Pt recalls scratching a tine pea sized lump on groin in the past and it felt moist afterwards. 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 Pt reports the doxycycline upset her stomach. She finished the doxycycline for 2 weeks and it upset her stomach so she got a prescription lidex that she's been using topically for relief of the itch. Pt denies drainage. Pt reports taking nexium without relief.  She had been taking methocarbamol as needed for periotic back spasms for a number of years. Last year she had 1-2+ pittiing edema in feet and ankles hyperpigmentation. She stopped medication and it resolved in 1-2 weeks. She tinks she's developed a medication induced allergy. For much of her life she's had random back spasms it starts on the right and progressed bilaterally debilitating. Equates them to labor pains and can't move until they have resolved after some minutes. She is not having increased thoracic mid line pain rating bilaterally up neck to shoulders under scapula.  Varies by day, not dependent on activity level rest wether minimally responsive to tylenol not responsive to ibuprofen or alleve. Worsening to now keeping her from  doing her activities. Fear that she may trigger the pain.   Pt has COPD but has never smoked in her life. She did grow up in a smoking environment as a child with a wood burning stove in her childhood home.  She's seen a pulmonologist regularly.   Depression screen PHQ 2/9 03/10/2016  Decreased Interest 0  Down, Depressed, Hopeless 0  PHQ - 2 Score 0   Patient Active Problem List   Diagnosis Date Noted  . COPD mixed type ([place]) 04/10/2015  . Laryngopharyngeal reflux (LPR) 04/10/2015  . Chronic laryngitis 08/17/2014  . Blindness and low vision 04/13/2013  . Osteoporosis 12/23/2012  . 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 Bronchitis 10/30/2011  . Benign hypertensive heart disease without heart failure 08/05/2011  . Pure hypercholesterolemia 08/05/2011  . Mitral regurgitation   . History of anemia   . History of diastolic dysfunction   . Reflux   . History of seasonal allergies    Past Medical History  Diagnosis Date  . Hypertension   . Mitral regurgitation     mild to moderate  . History of anemia   . History of diastolic dysfunction   . Arrhythmia   . Heart murmur   . Hyperlipidemia   . Reflux   . History of seasonal allergies   . Allergy   . Cataract   . Glaucoma   . Osteoporosis   . COPD (chronic obstructive pulmonary disease) ([place])   . GERD (gastroesophageal reflux disease)  Past Surgical History  Procedure Laterality Date  . Corneal transplant  july 2007  . Tonsillectomy and adenoidectomy      age 69yrs  . Appendectomy    . Childbirth      x 1  . Breast surgery    . Eye surgery     Allergies  Allergen Reactions  . Antihistamines, Diphenhydramine-Type Other (See Comments)    Increases blood pressure   . Celebrex [Celecoxib]     edema  . Darvocet [Propoxyphene N-Acetaminophen]     nausea  . Demerol     nausea  . Diphenhydramine Hcl Other (See Comments)    tachycardia  . Feldene [Piroxicam]     edema  . Meperidine Nausea And Vomiting    nausea    . Propoxyphene Other (See Comments)    dizziness  . Iodine-131 Rash    IVP dye   Prior to Admission medications   Medication Sig Start Date End Date Taking? Authorizing Provider  aspirin EC 81 MG tablet Take 81 mg by mouth daily.     Yes Historical Provider, MD  bromfenac (XIBROM) 0.09 % ophthalmic solution Place 1 drop into the right eye daily.  05/23/13  Yes Historical Provider, MD  cholecalciferol (VITAMIN D) 1000 UNITS tablet Take 2,000 Units by mouth daily.    Yes Historical Provider, MD  dextromethorphan-guaiFENesin (MUCINEX DM) 30-600 MG 12hr tablet Take 1 tablet by mouth 2 (two) times daily as needed.    Yes Historical Provider, MD  Docusate Calcium (STOOL SOFTENER PO) Take 1 capsule by mouth as needed (stool softener.).    Yes Historical Provider, MD  esomeprazole (NEXIUM) 40 MG capsule TAKE 1 CAPSULE (40 MG TOTAL) BY MOUTH DAILY BEFORE SUPPER. 09/28/15  Yes Noralee Space, MD  fluticasone-salmeterol (ADVAIR HFA) 115-21 MCG/ACT inhaler Inhale 2 puffs into the lungs 2 (two) times daily. 10/09/15  Yes Noralee Space, MD  hydrochlorothiazide (MICROZIDE) 12.5 MG capsule Take 6.25 mg by mouth daily as needed (for blood pressure). Reported on 12/06/2015   Yes Historical Provider, MD  ibuprofen (ADVIL,MOTRIN) 200 MG tablet Take 200 mg by mouth as needed for pain (for arthritis pain).   Yes Historical Provider, MD  naproxen sodium (ANAPROX) 220 MG tablet Take 220 mg by mouth 2 (two) times daily as needed (pain.).    Yes Historical Provider, MD  prednisoLONE acetate (OMNIPRED) 1 % ophthalmic suspension Place 1 drop into the right eye 4 (four) times daily.    Yes Historical Provider, MD  rosuvastatin (CRESTOR) 10 MG tablet TAKE 1 TABLET BY MOUTH EVERY DAY 10/12/15  Yes Darlin Coco, MD  timolol (BETIMOL) 0.5 % ophthalmic solution Place 1 drop into both eyes 2 (two) times daily.   Yes Historical Provider, MD   Social History   Social History  . Marital Status: Single    Spouse Name: Tina Mercado  .  Number of Children: Tina Mercado  . Years of Education: Tina Mercado   Occupational History  . Not on file.   Social History Main Topics  . Smoking status: Never Smoker   . Smokeless tobacco: Never Used  . Alcohol Use: No  . Drug Use: No  . Sexual Activity: No   Other Topics Concern  . Not on file   Social History Narrative   Review of Systems  Constitutional: Positive for activity change. Negative for fever and chills.  Respiratory: Positive for choking. Negative for shortness of breath and wheezing.   Genitourinary: Positive for genital sores. Negative for dysuria, vaginal bleeding,  vaginal discharge, difficulty urinating and vaginal pain.  Musculoskeletal: Positive for myalgias, back pain and arthralgias. Negative for joint swelling and gait problem.  Skin: Positive for color change, rash and wound.       Itchiness in her groin   Objective:   Physical Exam  Constitutional: She appears well-developed and well-nourished. No distress.  HENT:  Head: Normocephalic and atraumatic.  Eyes: Conjunctivae are normal.  Neck: Neck supple.  Cardiovascular: Normal rate.   Pulmonary/Chest: Effort normal.  Neurological: She is alert.  Skin: Skin is warm and dry.  Psychiatric: She has a normal mood and affect. Her behavior is normal.  Nursing note and vitals reviewed.  BP 128/68 mmHg  Pulse 87  Temp(Src) 98.6 F (37 C) (Oral)  Resp 16  Ht 5\' 2"  (1.575 m)  Wt 142 lb (64.411 kg)  BMI 25.97 kg/m2  SpO2 97%    Assessment & Plan:   1. Chronic bilateral thoracic back pain   2.    Labial lesion - appears benign - may be irritation from manipulation but pt would like to proceed with biopsy so she does not have to continue to worry and watch it which I think is appropriate  Meds ordered this encounter  Medications  . traMADol (ULTRAM) 50 MG tablet    Sig: Take 1 tablet (50 mg total) by mouth every 6 (six) hours as needed for moderate pain.    Dispense:  90 tablet    Refill:  1    I personally  performed the services described in this documentation, which was scribed in my presence. The recorded information has been reviewed and considered, and addended by me as needed.   Tina Mercado, M.D.  [facility] [address] [place], [place] [address] [phone] phone [phone] fax  03/28/2016 9:48 AM

## 2016-03-10 NOTE — Patient Instructions (Signed)
Medication Instructions: Your physician recommends that you continue on your current medications as directed. Please refer to the Current Medication list given to you today.  Labwork: Your physician recommends that you return for lab work in [place].  Testing/Procedures: 1. Echocardiogram in 6 months  - Your physician has requested that you have an echocardiogram. Echocardiography is a painless test that uses sound waves to create images of your heart. It provides your doctor with information about the size and shape of your heart and how well your heart's chambers and valves are working. This procedure takes approximately one hour. There are no restrictions for this procedure.  Follow-up: Dr Stanford Breed recommends that you schedule a follow-up appointment in 6 months. You will receive a reminder letter in the mail two months in advance. If you don't receive a letter, please call our office to schedule the follow-up appointment.  If you need a refill on your cardiac medications before your next appointment, please call your pharmacy.

## 2016-03-10 NOTE — Progress Notes (Signed)
Verbal consent obtained from patient.  Local anesthesia with 1cc Lidocaine 2% with epinephrine.  Sterile prep and drape. 3 mm punch on the medial, inferior aspect of the lesion, along the border.  Wound cleansed and dressed.

## 2016-03-10 NOTE — Assessment & Plan Note (Signed)
Patient follows her blood pressure and it is controlled on the medications. We will continue lifestyle modification. Follow blood pressure and adjust regimen if needed.

## 2016-03-20 ENCOUNTER — Telehealth: Payer: Self-pay

## 2016-03-20 NOTE — Telephone Encounter (Signed)
Notes Recorded by Shawnee Knapp, MD on 03/14/2016 at 6:07 PM Tina Mercado news! The biopsy returned showing that the area was just an inflamed scar - likely a response to de-roofing and manipulating a prior cyst and papule of some type. Absolutely nothing to worry about or that needs any further f/u. [name]

## 2016-03-20 NOTE — Telephone Encounter (Signed)
Pt is needing the results of her biopsy  Best number [phone]

## 2016-03-21 ENCOUNTER — Encounter: Payer: Self-pay | Admitting: Family Medicine

## 2016-03-21 NOTE — Telephone Encounter (Signed)
Pt aware of results via phone

## 2016-03-25 ENCOUNTER — Other Ambulatory Visit: Payer: Self-pay

## 2016-03-25 ENCOUNTER — Ambulatory Visit (HOSPITAL_COMMUNITY): Payer: Medicare Other | Attending: Cardiovascular Disease

## 2016-03-25 DIAGNOSIS — E785 Hyperlipidemia, unspecified: Secondary | ICD-10-CM | POA: Diagnosis not present

## 2016-03-25 DIAGNOSIS — I119 Hypertensive heart disease without heart failure: Secondary | ICD-10-CM | POA: Insufficient documentation

## 2016-03-25 DIAGNOSIS — I059 Rheumatic mitral valve disease, unspecified: Secondary | ICD-10-CM | POA: Insufficient documentation

## 2016-04-01 ENCOUNTER — Emergency Department (HOSPITAL_COMMUNITY): Payer: Medicare Other

## 2016-04-01 ENCOUNTER — Emergency Department (HOSPITAL_COMMUNITY)
Admission: EM | Admit: 2016-04-01 | Discharge: 2016-04-01 | Disposition: A | Discharge status: Home / Self Care | Payer: Medicare Other | Attending: Emergency Medicine | Admitting: Emergency Medicine

## 2016-04-01 ENCOUNTER — Encounter (HOSPITAL_COMMUNITY): Payer: Self-pay | Admitting: *Deleted

## 2016-04-01 DIAGNOSIS — Z862 Personal history of diseases of the blood and blood-forming organs and certain disorders involving the immune mechanism: Secondary | ICD-10-CM | POA: Insufficient documentation

## 2016-04-01 DIAGNOSIS — R011 Cardiac murmur, unspecified: Secondary | ICD-10-CM | POA: Insufficient documentation

## 2016-04-01 DIAGNOSIS — S29001A Unspecified injury of muscle and tendon of front wall of thorax, initial encounter: Secondary | ICD-10-CM | POA: Insufficient documentation

## 2016-04-01 DIAGNOSIS — S61412A Laceration without foreign body of left hand, initial encounter: Secondary | ICD-10-CM | POA: Diagnosis present

## 2016-04-01 DIAGNOSIS — Y998 Other external cause status: Secondary | ICD-10-CM | POA: Insufficient documentation

## 2016-04-01 DIAGNOSIS — M810 Age-related osteoporosis without current pathological fracture: Secondary | ICD-10-CM | POA: Insufficient documentation

## 2016-04-01 DIAGNOSIS — S29002A Unspecified injury of muscle and tendon of back wall of thorax, initial encounter: Secondary | ICD-10-CM | POA: Diagnosis not present

## 2016-04-01 DIAGNOSIS — K449 Diaphragmatic hernia without obstruction or gangrene: Secondary | ICD-10-CM | POA: Diagnosis not present

## 2016-04-01 DIAGNOSIS — S199XXA Unspecified injury of neck, initial encounter: Secondary | ICD-10-CM | POA: Diagnosis not present

## 2016-04-01 DIAGNOSIS — K219 Gastro-esophageal reflux disease without esophagitis: Secondary | ICD-10-CM | POA: Insufficient documentation

## 2016-04-01 DIAGNOSIS — Z79899 Other long term (current) drug therapy: Secondary | ICD-10-CM | POA: Insufficient documentation

## 2016-04-01 DIAGNOSIS — Z792 Long term (current) use of antibiotics: Secondary | ICD-10-CM | POA: Insufficient documentation

## 2016-04-01 DIAGNOSIS — Z7951 Long term (current) use of inhaled steroids: Secondary | ICD-10-CM | POA: Diagnosis not present

## 2016-04-01 DIAGNOSIS — Y92410 Unspecified street and highway as the place of occurrence of the external cause: Secondary | ICD-10-CM | POA: Diagnosis not present

## 2016-04-01 DIAGNOSIS — S6992XA Unspecified injury of left wrist, hand and finger(s), initial encounter: Secondary | ICD-10-CM | POA: Diagnosis not present

## 2016-04-01 DIAGNOSIS — T148 Other injury of unspecified body region: Secondary | ICD-10-CM | POA: Insufficient documentation

## 2016-04-01 DIAGNOSIS — J449 Chronic obstructive pulmonary disease, unspecified: Secondary | ICD-10-CM | POA: Insufficient documentation

## 2016-04-01 DIAGNOSIS — Z7982 Long term (current) use of aspirin: Secondary | ICD-10-CM | POA: Diagnosis not present

## 2016-04-01 DIAGNOSIS — T07XXXA Unspecified multiple injuries, initial encounter: Secondary | ICD-10-CM

## 2016-04-01 DIAGNOSIS — Z23 Encounter for immunization: Secondary | ICD-10-CM | POA: Diagnosis not present

## 2016-04-01 DIAGNOSIS — R079 Chest pain, unspecified: Secondary | ICD-10-CM | POA: Diagnosis not present

## 2016-04-01 DIAGNOSIS — Y9389 Activity, other specified: Secondary | ICD-10-CM | POA: Diagnosis not present

## 2016-04-01 DIAGNOSIS — S51012A Laceration without foreign body of left elbow, initial encounter: Secondary | ICD-10-CM | POA: Diagnosis not present

## 2016-04-01 DIAGNOSIS — S41109A Unspecified open wound of unspecified upper arm, initial encounter: Secondary | ICD-10-CM | POA: Diagnosis not present

## 2016-04-01 DIAGNOSIS — M79645 Pain in left finger(s): Secondary | ICD-10-CM | POA: Diagnosis not present

## 2016-04-01 DIAGNOSIS — R911 Solitary pulmonary nodule: Secondary | ICD-10-CM

## 2016-04-01 DIAGNOSIS — S3991XA Unspecified injury of abdomen, initial encounter: Secondary | ICD-10-CM | POA: Diagnosis not present

## 2016-04-01 DIAGNOSIS — S61217A Laceration without foreign body of left little finger without damage to nail, initial encounter: Secondary | ICD-10-CM | POA: Diagnosis not present

## 2016-04-01 DIAGNOSIS — S50312A Abrasion of left elbow, initial encounter: Secondary | ICD-10-CM | POA: Insufficient documentation

## 2016-04-01 DIAGNOSIS — IMO0002 Reserved for concepts with insufficient information to code with codable children: Secondary | ICD-10-CM

## 2016-04-01 DIAGNOSIS — I10 Essential (primary) hypertension: Secondary | ICD-10-CM | POA: Insufficient documentation

## 2016-04-01 DIAGNOSIS — Z7952 Long term (current) use of systemic steroids: Secondary | ICD-10-CM | POA: Diagnosis not present

## 2016-04-01 DIAGNOSIS — S0990XA Unspecified injury of head, initial encounter: Secondary | ICD-10-CM | POA: Insufficient documentation

## 2016-04-01 DIAGNOSIS — R52 Pain, unspecified: Secondary | ICD-10-CM

## 2016-04-01 DIAGNOSIS — H409 Unspecified glaucoma: Secondary | ICD-10-CM | POA: Diagnosis not present

## 2016-04-01 DIAGNOSIS — R51 Headache: Secondary | ICD-10-CM | POA: Diagnosis not present

## 2016-04-01 DIAGNOSIS — Z791 Long term (current) use of non-steroidal anti-inflammatories (NSAID): Secondary | ICD-10-CM | POA: Insufficient documentation

## 2016-04-01 DIAGNOSIS — E785 Hyperlipidemia, unspecified: Secondary | ICD-10-CM | POA: Diagnosis not present

## 2016-04-01 DIAGNOSIS — S299XXA Unspecified injury of thorax, initial encounter: Secondary | ICD-10-CM | POA: Diagnosis not present

## 2016-04-01 LAB — CBC WITH DIFFERENTIAL/PLATELET
BASOS ABS: 0 10*3/uL (ref 0.0–0.1)
BASOS PCT: 0 %
EOS ABS: 0.3 10*3/uL (ref 0.0–0.7)
Eosinophils Relative: 2 %
HEMATOCRIT: 36.3 % (ref 36.0–46.0)
HEMOGLOBIN: 11.4 g/dL — AB (ref 12.0–15.0)
Lymphocytes Relative: 10 %
Lymphs Abs: 1.1 10*3/uL (ref 0.7–4.0)
MCH: 25.4 pg — ABNORMAL LOW (ref 26.0–34.0)
MCHC: 31.4 g/dL (ref 30.0–36.0)
MCV: 80.8 fL (ref 78.0–100.0)
Monocytes Absolute: 0.7 10*3/uL (ref 0.1–1.0)
Monocytes Relative: 7 %
NEUTROS ABS: 8.2 10*3/uL — AB (ref 1.7–7.7)
NEUTROS PCT: 81 %
Platelets: 278 10*3/uL (ref 150–400)
RBC: 4.49 MIL/uL (ref 3.87–5.11)
RDW: 17.3 % — AB (ref 11.5–15.5)
WBC: 10.3 10*3/uL (ref 4.0–10.5)

## 2016-04-01 LAB — BASIC METABOLIC PANEL
ANION GAP: 10 (ref 5–15)
BUN: 15 mg/dL (ref 6–20)
CALCIUM: 9.2 mg/dL (ref 8.9–10.3)
CO2: 22 mmol/L (ref 22–32)
Chloride: 107 mmol/L (ref 101–111)
Creatinine, Ser: 1.09 mg/dL — ABNORMAL HIGH (ref 0.44–1.00)
GFR, EST AFRICAN AMERICAN: 52 mL/min — AB (ref >60)
GFR, EST NON AFRICAN AMERICAN: 45 mL/min — AB (ref >60)
GLUCOSE: 109 mg/dL — AB (ref 65–99)
POTASSIUM: 3.9 mmol/L (ref 3.5–5.1)
Sodium: 139 mmol/L (ref 135–145)

## 2016-04-01 LAB — I-STAT CHEM 8, ED
BUN: 18 mg/dL (ref 6–20)
CREATININE: 1.1 mg/dL — AB (ref 0.44–1.00)
Calcium, Ion: 1.13 mmol/L (ref 1.13–1.30)
Chloride: 107 mmol/L (ref 101–111)
Glucose, Bld: 106 mg/dL — ABNORMAL HIGH (ref 65–99)
HEMATOCRIT: 37 % (ref 36.0–46.0)
HEMOGLOBIN: 12.6 g/dL (ref 12.0–15.0)
POTASSIUM: 4 mmol/L (ref 3.5–5.1)
SODIUM: 141 mmol/L (ref 135–145)
TCO2: 23 mmol/L (ref 0–100)

## 2016-04-01 MED ORDER — SODIUM CHLORIDE 0.9 % IV BOLUS (SEPSIS)
500.0000 mL | Freq: Once | INTRAVENOUS | Status: AC
  Administered 2016-04-01: 500 mL via INTRAVENOUS

## 2016-04-01 MED ORDER — TETANUS-DIPHTH-ACELL PERTUSSIS 5-2.5-18.5 LF-MCG/0.5 IM SUSP
0.5000 mL | Freq: Once | INTRAMUSCULAR | Status: AC
  Administered 2016-04-01: 0.5 mL via INTRAMUSCULAR
  Filled 2016-04-01: qty 0.5

## 2016-04-01 MED ORDER — LIDOCAINE HCL (PF) 1 % IJ SOLN
5.0000 mL | Freq: Once | INTRAMUSCULAR | Status: AC
  Administered 2016-04-01: 5 mL
  Filled 2016-04-01: qty 5

## 2016-04-01 MED ORDER — CEPHALEXIN 500 MG PO CAPS: 500.0000 mg | ORAL_CAPSULE | Freq: Three times a day (TID) | ORAL | Status: DC

## 2016-04-01 NOTE — ED Notes (Signed)
Pt departed in NAD.  
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
## 2016-04-01 NOTE — ED Notes (Signed)
Pt returned from CT. Pt ambulated with only standby assistance to bathroom.
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
## 2016-04-01 NOTE — ED Provider Notes (Signed)
LACERATION REPAIR Performed by: Charlann Lange A Authorized by: Charlann Lange A Consent: Verbal consent obtained. Risks and benefits: risks, benefits and alternatives were discussed Consent given by: patient Patient identity confirmed: provided demographic data Prepped and Draped in normal sterile fashion Wound explored  Laceration Location: left elbow  Laceration Length: 3cm  No Foreign Bodies seen or palpated  Anesthesia: local infiltration  Local anesthetic: lidocaine 1% w/o epinephrine  Anesthetic total: 2 ml  Irrigation method: syringe Amount of cleaning: standard  Skin closure: 3-0 prolene  Number of sutures: 5  Technique: simple interrupted  Patient tolerance: Patient tolerated the procedure well with no immediate complications.   Posterior forearm abrasions cleaned and irrigated. No FB's observed Left 5th finger laceration numbed with 1% lidocaine plain and explored with forceps. No FB observed or palpated.   Charlann Lange, PA-C 04/01/16 [place], MD 04/01/16 [phone]

## 2016-04-08 ENCOUNTER — Ambulatory Visit (INDEPENDENT_AMBULATORY_CARE_PROVIDER_SITE_OTHER): Payer: Medicare Other | Admitting: Physician Assistant

## 2016-04-08 VITALS — BP 110/72 | HR 78 | Temp 98.2°F | Resp 16

## 2016-04-08 DIAGNOSIS — Z5189 Encounter for other specified aftercare: Secondary | ICD-10-CM

## 2016-04-08 NOTE — Progress Notes (Signed)
   04/08/2016 3:46 PM   DOB: 18-Feb-1929 / MRN: [id number]  SUBJECTIVE:  Tina Mercado is a 82 y.o. female presenting for suture removal.  Reports the sutures were placed at [facility] 7 days ago.  She denies any pain about the wound.   She is allergic to contrast media; antihistamines, diphenhydramine-type; celebrex; darvocet; demerol; diphenhydramine hcl; feldene; meperidine; propoxyphene; and iodine-131.   She  has a past medical history of Hypertension; Mitral regurgitation; History of anemia; History of diastolic dysfunction; Arrhythmia; Heart murmur; Hyperlipidemia; Reflux; History of seasonal allergies; Allergy; Cataract; Glaucoma; Osteoporosis; COPD (chronic obstructive pulmonary disease) ([place]); and GERD (gastroesophageal reflux disease).    She  reports that she has never smoked. She has never used smokeless tobacco. She reports that she does not drink alcohol or use illicit drugs. She  reports that she does not engage in sexual activity. 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  Her family history includes COPD in her brother; Emphysema in her brother and father; Heart disease in her mother.  Review of Systems  Constitutional: Negative for fever.  Skin: Negative for itching and rash.    Problem list and medications reviewed and updated by myself where necessary, and exist elsewhere in the encounter.   OBJECTIVE:  BP 110/72 mmHg  Pulse 78  Temp(Src) 98.2 F (36.8 C)  Resp 16  SpO2 95%  Physical Exam  Constitutional: Vital signs are normal.  Skin:       No results found for this or any previous visit (from the past 72 hour(s)).  No results found.  ASSESSMENT AND PLAN  Tina Mercado was seen today for suture / staple removal.  Diagnoses and all orders for this visit:  Encounter for wound care: The wound is healing. Advised against  suture removal today as there appears to be some tissue laxity across the wound.  She will return in three days for re-evaluation.    The patient was advised to call or return to clinic if she does not see an improvement in symptoms or to seek the care of the closest emergency department if she worsens with the above plan.   Philis Fendt, MHS, PA-C [facility] 04/08/2016 3:46 PM

## 2016-04-09 DIAGNOSIS — H1851 Endothelial corneal dystrophy: Secondary | ICD-10-CM | POA: Diagnosis not present

## 2016-04-09 DIAGNOSIS — Z947 Corneal transplant status: Secondary | ICD-10-CM | POA: Diagnosis not present

## 2016-04-09 DIAGNOSIS — Z961 Presence of intraocular lens: Secondary | ICD-10-CM | POA: Diagnosis not present

## 2016-04-11 ENCOUNTER — Ambulatory Visit (INDEPENDENT_AMBULATORY_CARE_PROVIDER_SITE_OTHER): Payer: Medicare Other | Admitting: Family Medicine

## 2016-04-11 VITALS — BP 110/62 | HR 88 | Temp 98.7°F | Resp 16 | Wt 139.8 lb

## 2016-04-11 DIAGNOSIS — Z4802 Encounter for removal of sutures: Secondary | ICD-10-CM
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
## 2016-04-11 NOTE — Patient Instructions (Signed)
     IF you received an x-ray today, you will receive an invoice from [facility]. Please contact [facility] at [phone] with questions or concerns regarding your invoice.   IF you received labwork today, you will receive an invoice from [facility]. Please contact [facility] at [phone] with questions or concerns regarding your invoice.   Our billing staff will not be able to assist you with questions regarding bills from these companies.  You will be contacted with the lab results as soon as they are available. The fastest way to get your results is to activate your My Chart account. Instructions are located on the last page of this paperwork. If you have not heard from us regarding the results in 2 weeks, please contact this office.      

## 2016-04-26 ENCOUNTER — Other Ambulatory Visit: Payer: Self-pay | Admitting: Pulmonary Disease

## 2016-04-28 ENCOUNTER — Other Ambulatory Visit: Payer: Self-pay

## 2016-04-28 MED ORDER — ROSUVASTATIN CALCIUM 10 MG PO TABS: 10.0000 mg | ORAL_TABLET | Freq: Every day | ORAL | Status: DC

## 2016-05-29 DIAGNOSIS — J029 Acute pharyngitis, unspecified: Secondary | ICD-10-CM | POA: Diagnosis not present

## 2016-07-07 ENCOUNTER — Encounter: Payer: Self-pay | Admitting: Pulmonary Disease

## 2016-07-07 ENCOUNTER — Ambulatory Visit (INDEPENDENT_AMBULATORY_CARE_PROVIDER_SITE_OTHER): Payer: Medicare Other | Admitting: Pulmonary Disease

## 2016-07-07 VITALS — BP 120/70 | HR 66 | Temp 97.0°F | Ht 62.5 in | Wt 139.0 lb

## 2016-07-07 DIAGNOSIS — J387 Other diseases of larynx: Secondary | ICD-10-CM | POA: Diagnosis not present

## 2016-07-07 DIAGNOSIS — I340 Nonrheumatic mitral (valve) insufficiency: Secondary | ICD-10-CM | POA: Diagnosis not present

## 2016-07-07 DIAGNOSIS — K219 Gastro-esophageal reflux disease without esophagitis: Secondary | ICD-10-CM

## 2016-07-07 DIAGNOSIS — Z8679 Personal history of other diseases of the circulatory system: Secondary | ICD-10-CM | POA: Diagnosis not present

## 2016-07-07 DIAGNOSIS — J449 Chronic obstructive pulmonary disease, unspecified: Secondary | ICD-10-CM | POA: Diagnosis not present

## 2016-07-07 DIAGNOSIS — I119 Hypertensive heart disease without heart failure: Secondary | ICD-10-CM | POA: Diagnosis not present

## 2016-07-07 NOTE — Patient Instructions (Signed)
Today we updated your med list in our EPIC system...    Continue your current medications the same...  Stay as active as possible, and BE SAFE...  Call for any questions...  Let's plan a follow up visit in 59mo, sooner if needed for problems.Marland KitchenMarland Kitchen
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
## 2016-07-16 DIAGNOSIS — Z947 Corneal transplant status: Secondary | ICD-10-CM | POA: Diagnosis not present

## 2016-07-16 DIAGNOSIS — Z961 Presence of intraocular lens: Secondary | ICD-10-CM | POA: Diagnosis not present

## 2016-07-16 DIAGNOSIS — H1851 Endothelial corneal dystrophy: Secondary | ICD-10-CM | POA: Diagnosis not present

## 2016-07-24 DIAGNOSIS — H2512 Age-related nuclear cataract, left eye: Secondary | ICD-10-CM | POA: Diagnosis not present

## 2016-07-24 DIAGNOSIS — Z947 Corneal transplant status: Secondary | ICD-10-CM | POA: Diagnosis not present

## 2016-07-24 DIAGNOSIS — H40041 Steroid responder, right eye: Secondary | ICD-10-CM | POA: Diagnosis not present

## 2016-07-24 DIAGNOSIS — Z961 Presence of intraocular lens: Secondary | ICD-10-CM | POA: Diagnosis not present

## 2016-07-24 DIAGNOSIS — H35353 Cystoid macular degeneration, bilateral: Secondary | ICD-10-CM | POA: Diagnosis not present

## 2016-07-25 DIAGNOSIS — H1851 Endothelial corneal dystrophy: Secondary | ICD-10-CM | POA: Diagnosis not present

## 2016-07-25 DIAGNOSIS — Z961 Presence of intraocular lens: Secondary | ICD-10-CM | POA: Diagnosis not present

## 2016-07-25 DIAGNOSIS — Z947 Corneal transplant status: Secondary | ICD-10-CM | POA: Diagnosis not present

## 2016-08-13 DIAGNOSIS — Z23 Encounter for immunization: Secondary | ICD-10-CM | POA: Diagnosis not present

## 2016-08-27 DIAGNOSIS — Z947 Corneal transplant status: Secondary | ICD-10-CM | POA: Diagnosis not present

## 2016-08-27 DIAGNOSIS — H1851 Endothelial corneal dystrophy: Secondary | ICD-10-CM | POA: Diagnosis not present

## 2016-08-27 DIAGNOSIS — Z961 Presence of intraocular lens: Secondary | ICD-10-CM | POA: Diagnosis not present

## 2016-09-08 NOTE — Progress Notes (Signed)
HPI: FU hypertension and MR. H/O mild to moderate MR although previous echos not available. Echo 5/17 showed normal LV systolic function; grade 1 DD; mild LAE. Since last seen, the patient has dyspnea with more extreme activities but not with routine activities. It is relieved with rest. It is not associated with chest pain. There is no orthopnea, PND or pedal edema. There is no syncope or palpitations. There is no exertional chest pain.   Current Outpatient Prescriptions  Medication Sig Dispense Refill  . amoxicillin (AMOXIL) 500 MG capsule Take 4 capsules by mouth one hour prior to dental procedure    . aspirin EC 81 MG tablet Take 81 mg by mouth daily.      . bromfenac (XIBROM) 0.09 % ophthalmic solution Place 1 drop into the right eye daily.     . cholecalciferol (VITAMIN D) 1000 UNITS tablet Take 2,000 Units by mouth daily.     Mariane Baumgarten Calcium (STOOL SOFTENER PO) Take 1 capsule by mouth daily as needed (for mild constipation).     Marland Kitchen esomeprazole (NEXIUM) 40 MG capsule TAKE ONE CAPSULE BY MOUTH EVERY DAY BEFORE SUPPER 90 capsule 1  . fluocinonide cream (LIDEX) AB-123456789 % Apply 1 application topically 2 (two) times daily.     . fluticasone-salmeterol (ADVAIR HFA) 115-21 MCG/ACT inhaler Inhale 2 puffs into the lungs 2 (two) times daily. 1 Inhaler 12  . hydrochlorothiazide (HYDRODIURIL) 12.5 MG tablet Take 6.25 mg by mouth daily as needed.    Marland Kitchen ibuprofen (ADVIL,MOTRIN) 200 MG tablet Take 200 mg by mouth every 6 (six) hours as needed for headache or mild pain (for arthritis pain).     . rosuvastatin (CRESTOR) 10 MG tablet Take 1 tablet (10 mg total) by mouth daily. 90 tablet 3  . timolol (BETIMOL) 0.5 % ophthalmic solution Place 1 drop into both eyes 2 (two) times daily.    . traMADol (ULTRAM) 50 MG tablet Take 1 tablet (50 mg total) by mouth every 6 (six) hours as needed for moderate pain. 90 tablet 1   No current facility-administered medications for this visit.      Past Medical  History:  Diagnosis Date  . Allergy   . Arrhythmia   . Cataract   . COPD (chronic obstructive pulmonary disease) ([place])   . GERD (gastroesophageal reflux disease)   . Glaucoma   . Heart murmur   . History of anemia   . History of diastolic dysfunction   . History of seasonal allergies   . Hyperlipidemia   . Hypertension   . Mitral regurgitation    mild to moderate  . Osteoporosis   . Reflux     Past Surgical History:  Procedure Laterality Date  . APPENDECTOMY    . BREAST SURGERY    . childbirth     x 1  . CORNEAL TRANSPLANT  july 2007  . EYE SURGERY    . TONSILLECTOMY AND ADENOIDECTOMY     age 57yrs    Social History   Social History  . Marital status: Single    Spouse name: N/A  . Number of children: N/A  . Years of education: N/A   Occupational History  . Not on file.   Social History Main Topics  . Smoking status: Never Smoker  . Smokeless tobacco: Never Used  . Alcohol use No  . Drug use: No  . Sexual activity: No   Other Topics Concern  . Not on file   Social History Narrative  .  No narrative on file    Family History  Problem Relation Age of Onset  . Heart disease Mother   . Emphysema Father   . Emphysema Brother   . COPD Brother     ROS: no fevers or chills, productive cough, hemoptysis, dysphasia, odynophagia, melena, hematochezia, dysuria, hematuria, rash, seizure activity, orthopnea, PND, pedal edema, claudication. Remaining systems are negative.  Physical Exam: Well-developed well-nourished in no acute distress.  Skin is warm and dry.  HEENT is normal.  Neck is supple.  Chest is clear to auscultation with normal expansion.  Cardiovascular exam is regular rate and rhythm.  Abdominal exam nontender or distended. No masses palpated. Extremities show no edema. neuro grossly intact  ECG-Sinus rhythm with first-degree AV block, frequent PVCs, no ST changes.  A/P  1 Hyperlipidemia-continue statin.  2 mitral regurgitation-most  recent echocardiogram showed no significant mitral regurgitation.  3 hypertension-blood pressure elevated. However she follows this and it is typically controlled. Continue present medications. Check potassium and renal function. Patient also requests hemoglobin A1c.  Kirk Ruths, MD

## 2016-09-15 ENCOUNTER — Encounter: Payer: Self-pay | Admitting: Cardiology

## 2016-09-15 ENCOUNTER — Ambulatory Visit (INDEPENDENT_AMBULATORY_CARE_PROVIDER_SITE_OTHER): Payer: Medicare Other | Admitting: Cardiology

## 2016-09-15 VITALS — BP 164/70 | HR 68 | Ht 62.5 in | Wt 132.2 lb

## 2016-09-15 DIAGNOSIS — E7800 Pure hypercholesterolemia, unspecified: Secondary | ICD-10-CM

## 2016-09-15 DIAGNOSIS — R739 Hyperglycemia, unspecified: Secondary | ICD-10-CM | POA: Diagnosis not present

## 2016-09-15 DIAGNOSIS — I10 Essential (primary) hypertension: Secondary | ICD-10-CM

## 2016-09-15 LAB — BASIC METABOLIC PANEL
BUN: 15 mg/dL (ref 7–25)
CALCIUM: 9.2 mg/dL (ref 8.6–10.4)
CO2: 26 mmol/L (ref 20–31)
CREATININE: 0.9 mg/dL — AB (ref 0.60–0.88)
Chloride: 106 mmol/L (ref 98–110)
Glucose, Bld: 104 mg/dL — ABNORMAL HIGH (ref 65–99)
Potassium: 5 mmol/L (ref 3.5–5.3)
SODIUM: 139 mmol/L (ref 135–146)

## 2016-09-15 NOTE — Patient Instructions (Signed)

## 2016-09-16 ENCOUNTER — Encounter: Payer: Self-pay | Admitting: Cardiology

## 2016-09-16 LAB — HEMOGLOBIN A1C
HEMOGLOBIN A1C: 5.9 % — AB (ref <5.7)
MEAN PLASMA GLUCOSE: 123 mg/dL

## 2016-09-16 NOTE — Telephone Encounter (Signed)
This encounter was created in error - please disregard.

## 2016-09-16 NOTE — Telephone Encounter (Signed)
Pt is wondering if she needs a lipid test and have questions about test results

## 2016-09-24 DIAGNOSIS — Z947 Corneal transplant status: Secondary | ICD-10-CM | POA: Diagnosis not present

## 2016-09-24 DIAGNOSIS — Z961 Presence of intraocular lens: Secondary | ICD-10-CM | POA: Diagnosis not present

## 2016-09-24 DIAGNOSIS — H1851 Endothelial corneal dystrophy: Secondary | ICD-10-CM | POA: Diagnosis not present

## 2016-10-06 ENCOUNTER — Other Ambulatory Visit: Payer: Self-pay | Admitting: Pulmonary Disease

## 2016-11-26 DIAGNOSIS — H1851 Endothelial corneal dystrophy: Secondary | ICD-10-CM | POA: Diagnosis not present

## 2016-11-26 DIAGNOSIS — Z947 Corneal transplant status: Secondary | ICD-10-CM | POA: Diagnosis not present

## 2016-11-26 DIAGNOSIS — Z961 Presence of intraocular lens: Secondary | ICD-10-CM | POA: Diagnosis not present

## 2016-11-26 DIAGNOSIS — H04121 Dry eye syndrome of right lacrimal gland: Secondary | ICD-10-CM | POA: Diagnosis not present

## 2016-12-02 ENCOUNTER — Other Ambulatory Visit: Payer: Self-pay | Admitting: Pulmonary Disease

## 2016-12-17 DIAGNOSIS — H1851 Endothelial corneal dystrophy: Secondary | ICD-10-CM | POA: Diagnosis not present

## 2016-12-17 DIAGNOSIS — Z961 Presence of intraocular lens: Secondary | ICD-10-CM | POA: Diagnosis not present

## 2016-12-17 DIAGNOSIS — H04121 Dry eye syndrome of right lacrimal gland: Secondary | ICD-10-CM | POA: Diagnosis not present

## 2016-12-17 DIAGNOSIS — Z947 Corneal transplant status: Secondary | ICD-10-CM | POA: Diagnosis not present

## 2017-01-05 ENCOUNTER — Ambulatory Visit: Payer: Medicare Other | Admitting: Pulmonary Disease

## 2017-01-09 ENCOUNTER — Other Ambulatory Visit: Payer: Self-pay | Admitting: Pulmonary Disease

## 2017-01-13 ENCOUNTER — Ambulatory Visit (INDEPENDENT_AMBULATORY_CARE_PROVIDER_SITE_OTHER): Payer: Medicare Other | Admitting: Pulmonary Disease

## 2017-01-13 ENCOUNTER — Encounter: Payer: Self-pay | Admitting: Pulmonary Disease

## 2017-01-13 VITALS — BP 120/68 | HR 67 | Temp 97.3°F | Ht 62.5 in | Wt 128.5 lb

## 2017-01-13 DIAGNOSIS — I340 Nonrheumatic mitral (valve) insufficiency: Secondary | ICD-10-CM

## 2017-01-13 DIAGNOSIS — I119 Hypertensive heart disease without heart failure: Secondary | ICD-10-CM

## 2017-01-13 DIAGNOSIS — J449 Chronic obstructive pulmonary disease, unspecified: Secondary | ICD-10-CM

## 2017-01-13 DIAGNOSIS — Z8679 Personal history of other diseases of the circulatory system: Secondary | ICD-10-CM | POA: Diagnosis not present

## 2017-01-13 DIAGNOSIS — K219 Gastro-esophageal reflux disease without esophagitis: Secondary | ICD-10-CM | POA: Diagnosis not present

## 2017-01-13 MED ORDER — BACLOFEN 10 MG PO TABS: ORAL_TABLET | ORAL | 1 refills | Status: DC
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
## 2017-01-13 NOTE — Patient Instructions (Signed)
Today we updated your med list in our EPIC system...    Continue your current medications the same...  We discussed trying to help your "back spasms" w/ a muscle relaxer>>    Take the new LIORESAL 10mg  tabs 1/2 to 1 tab up to 3 times daily as needed...  Call for any questions...  Let's plan a follow up visit in 44mo, sooner if needed for any breathing problems.Marland KitchenMarland Kitchen

## 2017-02-09 ENCOUNTER — Other Ambulatory Visit: Payer: Self-pay | Admitting: Cardiology

## 2017-02-10 NOTE — Telephone Encounter (Signed)
Rx(s) sent to pharmacy electronically.  

## 2017-03-25 DIAGNOSIS — Z961 Presence of intraocular lens: Secondary | ICD-10-CM | POA: Diagnosis not present

## 2017-03-25 DIAGNOSIS — Z947 Corneal transplant status: Secondary | ICD-10-CM | POA: Diagnosis not present

## 2017-03-25 DIAGNOSIS — H1851 Endothelial corneal dystrophy: Secondary | ICD-10-CM | POA: Diagnosis not present

## 2017-03-25 DIAGNOSIS — H04121 Dry eye syndrome of right lacrimal gland: Secondary | ICD-10-CM | POA: Diagnosis not present

## 2017-03-25 DIAGNOSIS — H2512 Age-related nuclear cataract, left eye: Secondary | ICD-10-CM | POA: Diagnosis not present

## 2017-05-04 ENCOUNTER — Encounter: Payer: Self-pay | Admitting: Physician Assistant

## 2017-05-04 ENCOUNTER — Ambulatory Visit (INDEPENDENT_AMBULATORY_CARE_PROVIDER_SITE_OTHER): Payer: Medicare Other | Admitting: Physician Assistant

## 2017-05-04 VITALS — BP 122/72 | HR 64 | Ht 62.0 in | Wt 129.6 lb

## 2017-05-04 DIAGNOSIS — I340 Nonrheumatic mitral (valve) insufficiency: Secondary | ICD-10-CM

## 2017-05-04 DIAGNOSIS — Z79899 Other long term (current) drug therapy: Secondary | ICD-10-CM

## 2017-05-04 DIAGNOSIS — E785 Hyperlipidemia, unspecified: Secondary | ICD-10-CM

## 2017-05-04 DIAGNOSIS — R7309 Other abnormal glucose: Secondary | ICD-10-CM

## 2017-05-04 DIAGNOSIS — I10 Essential (primary) hypertension: Secondary | ICD-10-CM

## 2017-05-04 DIAGNOSIS — J449 Chronic obstructive pulmonary disease, unspecified: Secondary | ICD-10-CM

## 2017-05-04 DIAGNOSIS — R7303 Prediabetes: Secondary | ICD-10-CM

## 2017-05-04 NOTE — Patient Instructions (Signed)
Medication Instructions:   No changes  Labwork:   Hemoglobin A1C, cholesterol, and liver function tests TODAY.  Testing/Procedures:  none  Follow-Up:  6 months with Dr. Stanford Breed   If you need a refill on your cardiac medications before your next appointment, please call your pharmacy.

## 2017-05-04 NOTE — Progress Notes (Signed)
Cardiology Office Note    Date:  05/04/2017   ID:  Tina Mercado, DOB September 14, 1929, MRN [id number]  PCP:  Shawnee Knapp, MD  Cardiologist:  Dr. Stanford Breed (previously Dr. Mare Ferrari)  Chief Complaint  Patient presents with  . Follow-up    seen for Dr. Stanford Breed    History of Present Illness:  Tina Mercado is a 83 y.o. female with PMH of HTN, HLD, MR and COPD. She has a history of mild to moderate MR, although previous echocardiogram was not available. Last echocardiogram obtained on 03/25/2016 showed EF 55-60%, mild LVH, grade 1 diastolic dysfunction, trivial regurgitation. She was last seen by Dr. Stanford Breed on 09/15/2016 at which time she was doing well.  Patient presents today for cardiology office evaluation. EKG today does show poor R wave progression in the anterior leads, however she denies any recent chest pain or shortness of breath at rest. She does have chronic dyspnea on exertion related to COPD, this has not changed. She has no lower extremity edema, no orthopnea or PND. Her previous hemoglobin A1c was 5.9 which put her in the prediabetes range, we will obtain a repeat hemoglobin A1c today. She has been compliant on the Crestor, we will obtain fasting lipid panel and LFT. Despite her previous history of hypertension, she is actually no longer take any blood pressure medication. She has hydrochlorothiazide on an as-needed basis which she has not taken for a long time. She says her blood pressure was only high for several years after her husband passed away. I think it this is probably more related to her emotion than true hypertension. Otherwise on physical exam, I was unable to hear her mitral regurgitation. Given previously reassuring echocardiogram last year, there is no need to repeat echocardiogram at this time. She can see Dr. Stanford Breed in 6 months.  Note, patient has chronic right eyelid due to cataract surgery, both open and closed angle glaucoma in the past.   Past Medical  History:  Diagnosis Date  . Allergy   . Arrhythmia   . Cataract   . COPD (chronic obstructive pulmonary disease) ([place])   . GERD (gastroesophageal reflux disease)   . Glaucoma   . Heart murmur   . History of anemia   . History of diastolic dysfunction   . History of seasonal allergies   . Hyperlipidemia   . Hypertension   . Mitral regurgitation    mild to moderate  . Osteoporosis   . Reflux     Past Surgical History:  Procedure Laterality Date  . APPENDECTOMY    . BREAST SURGERY    . childbirth     x 1  . CORNEAL TRANSPLANT  july 2007  . EYE SURGERY    . TONSILLECTOMY AND ADENOIDECTOMY     age 10yrs    Current Medications: Outpatient Medications Prior to Visit  Medication Sig Dispense Refill  . acetaminophen (TYLENOL) 500 MG tablet Take 500 mg by mouth every 6 (six) hours as needed.    Marland Kitchen ADVAIR HFA 115-21 MCG/ACT inhaler TAKE 2 PUFFS BY MOUTH TWICE A DAY 12 Inhaler 11  . amoxicillin (AMOXIL) 500 MG capsule Take 4 capsules by mouth one hour prior to dental procedure    . aspirin EC 81 MG tablet Take 81 mg by mouth daily.      . baclofen (LIORESAL) 10 MG tablet Take 1/2 to 1 tablet by mouth three times daily as needed for back spasms 50 tablet 1  . bromfenac (XIBROM)  0.09 % ophthalmic solution Place 1 drop into the right eye daily.     . cholecalciferol (VITAMIN D) 1000 UNITS tablet Take 2,000 Units by mouth daily.     Mariane Baumgarten Calcium (STOOL SOFTENER PO) Take 1 capsule by mouth daily as needed (for mild constipation).     Marland Kitchen esomeprazole (NEXIUM) 40 MG capsule TAKE ONE CAPSULE BY MOUTH EVERY DAY BEFORE SUPPER 90 capsule 1  . fluocinonide cream (LIDEX) 2.22 % Apply 1 application topically 2 (two) times daily.     . hydrochlorothiazide (HYDRODIURIL) 12.5 MG tablet Take 6.25 mg by mouth daily as needed.    Marland Kitchen ibuprofen (ADVIL,MOTRIN) 200 MG tablet Take 200 mg by mouth every 6 (six) hours as needed for headache or mild pain (for arthritis pain).     . rosuvastatin (CRESTOR)  10 MG tablet Take 1 tablet (10 mg total) by mouth daily. <PLEASE MAKE APPOINTMENT FOR REFILLS> 90 tablet 0  . timolol (BETIMOL) 0.5 % ophthalmic solution Place 1 drop into both eyes 2 (two) times daily.    . traMADol (ULTRAM) 50 MG tablet Take 1 tablet (50 mg total) by mouth every 6 (six) hours as needed for moderate pain. 90 tablet 1   No facility-administered medications prior to visit.      Allergies:   Contrast media [iodinated diagnostic agents]; Antihistamines, diphenhydramine-type; Celebrex [celecoxib]; Darvocet [propoxyphene n-acetaminophen]; Demerol; Diphenhydramine hcl; Feldene [piroxicam]; Meperidine; Propoxyphene; and Iodine-131   Social History   Social History  . Marital status: Single    Spouse name: N/A  . Number of children: N/A  . Years of education: N/A   Social History Main Topics  . Smoking status: Never Smoker  . Smokeless tobacco: Never Used  . Alcohol use No  . Drug use: No  . Sexual activity: No   Other Topics Concern  . None   Social History Narrative  . None     Family History:  The patient's family history includes COPD in her brother; Emphysema in her brother and father; Heart disease in her mother.   ROS:   Please see the history of present illness.    ROS All other systems reviewed and are negative.   PHYSICAL EXAM:   VS:  BP 122/72   Pulse 64   Ht 5\' 2"  (1.575 m)   Wt 129 lb 9.6 oz (58.8 kg)   BMI 23.70 kg/m    GEN: Well nourished, well developed, in no acute distress  HEENT: normal  Neck: no JVD, carotid bruits, or masses Cardiac: RRR; no murmurs, rubs, or gallops,no edema  Respiratory:  clear to auscultation bilaterally, normal work of breathing GI: soft, nontender, nondistended, + BS MS: no deformity or atrophy  Skin: warm and dry, no rash Neuro:  Alert and Oriented x 3, Strength and sensation are intact Psych: euthymic mood, full affect  Wt Readings from Last 3 Encounters:  05/04/17 129 lb 9.6 oz (58.8 kg)  01/13/17 128 lb  8 oz (58.3 kg)  09/15/16 132 lb 3.2 oz (60 kg)      Studies/Labs Reviewed:   EKG:  EKG is ordered today.  The ekg ordered today demonstrates Normal sinus rhythm, poor R wave progression in anterior lead  Recent Labs: 09/15/2016: BUN 15; Creat 0.90; Potassium 5.0; Sodium 139   Lipid Panel    Component Value Date/Time   CHOL 183 03/10/2016 1237   TRIG 118 03/10/2016 1237   HDL 55 03/10/2016 1237   CHOLHDL 3.3 03/10/2016 1237   VLDL 24  03/10/2016 1237   [place] 104 03/10/2016 1237    Additional studies/ records that were reviewed today include:   Echo 03/25/2016 LV EF: 55% -   60%  Study Conclusions  - Left ventricle: The cavity size was normal. Wall thickness was   increased in a pattern of mild LVH. Systolic function was normal.   The estimated ejection fraction was in the range of 55% to 60%.   Wall motion was normal; there were no regional wall motion   abnormalities. Doppler parameters are consistent with abnormal   left ventricular relaxation (grade 1 diastolic dysfunction). - Mitral valve: Calcified annulus. Mildly thickened leaflets . - Left atrium: The atrium was mildly dilated. - Atrial septum: No defect or patent foramen ovale was identified.   ASSESSMENT:    1. Non-rheumatic mitral regurgitation   2. Elevated hemoglobin A1c measurement   3. Encounter for long-term (current) use of medications   4. Hyperlipidemia, unspecified hyperlipidemia type   5. Prediabetes   6. Essential hypertension   7. Chronic obstructive pulmonary disease, unspecified COPD type ([place])      PLAN:  In order of problems listed above:  1. Mitral regurgitation: Had a remote history of mild to moderate MR, however recent echocardiogram in 2017 did not show significant mitral regurgitation. On physical exam, she has no significant heart murmur either.  2. History of hypertension: Despite she carry a diagnosis of hypertension, she actually has normal blood pressure. She has HCTZ on  an as needed basis which she has not needed for a long time. She says her blood pressure was only elevated for several years after her husband passed away and she was under a lot of stress.  3. Hyperlipidemia: On Crestor, obtain fasting lipid panel and LFTs.  4. Prediabetes: Previous hemoglobin A1c was 5.9, will recheck a hemoglobin A1c today.    Medication Adjustments/Labs and Tests Ordered: Current medicines are reviewed at length with the patient today.  Concerns regarding medicines are outlined above.  Medication changes, Labs and Tests ordered today are listed in the Patient Instructions below. Patient Instructions  Medication Instructions:   No changes  Labwork:   Hemoglobin A1C, cholesterol, and liver function tests TODAY.  Testing/Procedures:  none  Follow-Up:  6 months with Dr. Stanford Breed   If you need a refill on your cardiac medications before your next appointment, please call your pharmacy.      Hilbert Corrigan, [place]  05/04/2017 9:25 AM    [address], [place], [place]  [address] Phone: [phone]; Fax: [phone]

## 2017-05-05 ENCOUNTER — Other Ambulatory Visit: Payer: Self-pay | Admitting: Cardiology

## 2017-05-05 LAB — HEPATIC FUNCTION PANEL
ALBUMIN: 4.1 g/dL (ref 3.5–4.7)
ALK PHOS: 65 IU/L (ref 39–117)
ALT: 11 IU/L (ref 0–32)
AST: 15 IU/L (ref 0–40)
BILIRUBIN, DIRECT: 0.16 mg/dL (ref 0.00–0.40)
Bilirubin Total: 0.6 mg/dL (ref 0.0–1.2)
Total Protein: 6.4 g/dL (ref 6.0–8.5)

## 2017-05-05 LAB — LIPID PANEL
CHOL/HDL RATIO: 2.9 ratio (ref 0.0–4.4)
Cholesterol, Total: 164 mg/dL (ref 100–199)
HDL: 57 mg/dL (ref >39)
LDL Calculated: 84 mg/dL (ref 0–99)
Triglycerides: 113 mg/dL (ref 0–149)
VLDL Cholesterol Cal: 23 mg/dL (ref 5–40)

## 2017-05-05 LAB — HEMOGLOBIN A1C
Est. average glucose Bld gHb Est-mCnc: 120 mg/dL
HEMOGLOBIN A1C: 5.8 % — AB (ref 4.8–5.6)

## 2017-05-05 NOTE — Addendum Note (Signed)
Addended by: Theodore Demark on: 05/05/2017 10:12 AM   Modules accepted: Orders

## 2017-05-05 NOTE — Progress Notes (Signed)
Hgb A1C 5.8, similar to previous 5.9, recommend diet control for now

## 2017-07-01 DIAGNOSIS — H1851 Endothelial corneal dystrophy: Secondary | ICD-10-CM | POA: Diagnosis not present

## 2017-07-01 DIAGNOSIS — H02401 Unspecified ptosis of right eyelid: Secondary | ICD-10-CM | POA: Diagnosis not present

## 2017-07-01 DIAGNOSIS — Z961 Presence of intraocular lens: Secondary | ICD-10-CM | POA: Diagnosis not present

## 2017-07-01 DIAGNOSIS — Z947 Corneal transplant status: Secondary | ICD-10-CM | POA: Diagnosis not present

## 2017-07-01 DIAGNOSIS — H2512 Age-related nuclear cataract, left eye: Secondary | ICD-10-CM | POA: Diagnosis not present

## 2017-07-16 ENCOUNTER — Ambulatory Visit (INDEPENDENT_AMBULATORY_CARE_PROVIDER_SITE_OTHER): Payer: Medicare Other | Admitting: Pulmonary Disease

## 2017-07-16 ENCOUNTER — Encounter: Payer: Self-pay | Admitting: Pulmonary Disease

## 2017-07-16 ENCOUNTER — Telehealth: Payer: Self-pay | Admitting: Pulmonary Disease

## 2017-07-16 VITALS — BP 128/76 | HR 61 | Temp 97.6°F | Ht 62.5 in | Wt 126.1 lb

## 2017-07-16 DIAGNOSIS — K219 Gastro-esophageal reflux disease without esophagitis: Secondary | ICD-10-CM

## 2017-07-16 DIAGNOSIS — I119 Hypertensive heart disease without heart failure: Secondary | ICD-10-CM

## 2017-07-16 DIAGNOSIS — J449 Chronic obstructive pulmonary disease, unspecified: Secondary | ICD-10-CM | POA: Diagnosis not present

## 2017-07-16 DIAGNOSIS — G629 Polyneuropathy, unspecified: Secondary | ICD-10-CM | POA: Insufficient documentation

## 2017-07-16 DIAGNOSIS — I340 Nonrheumatic mitral (valve) insufficiency: Secondary | ICD-10-CM | POA: Diagnosis not present

## 2017-07-16 MED ORDER — GABAPENTIN 100 MG PO CAPS: ORAL_CAPSULE | ORAL | 1 refills | Status: DC

## 2017-07-16 NOTE — Patient Instructions (Signed)
Today we updated your med list in our EPIC system...    Continue your current medications the same...  Try the GABAPENTIN as we discussed...    Start w/ one tab at bedtime for several weeks, then slowly increase to 2 tabs and 3 tabs at bedtime...    Make a mental note or write down how the neuropathy & back pain are doing on this medication...  Call for any questions...  Let's plan a follow up visit in 39mo, sooner if needed for problems.Marland KitchenMarland Kitchen
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
## 2017-07-16 NOTE — Telephone Encounter (Signed)
gabapentin (NEURONTIN) 100 MG capsule [[id number]]  Order Details  Dose, Route, Frequency: As Directed   Dispense Quantity:  90 capsule Refills:  1 Fills remaining:  --        Sig: Take as directed       Written Date:  07/16/17 Expiration Date:  07/16/18    Start Date:  07/16/17 End Date:  --         Ordering Provider:  Noralee Space, MD DEA #:  [id number] NPI:  [id number]   Authorizing Provider:  Noralee Space, MD DEA #:  [id number] NPI:  [id number]      The Rx states take as directed.  Try the GABAPENTIN as we discussed...    Start w/ one tab at bedtime for several weeks, then slowly increase to 2 tabs and 3 tabs at bedtime...    Make a mental note or write down how the neuropathy & back pain are doing on this medication...  Spoke with the pharmacist, she states she is afraid pt will run out of pills because I read her the note above.. Do you want her to do 2 week increments? Please advise SN.

## 2017-07-20 NOTE — Telephone Encounter (Signed)
Per SN---   Send her #90 for her to take 3 tabs at bedtime as directed.  Thanks

## 2017-07-20 NOTE — Telephone Encounter (Signed)
Spoke with pharmacist and clarified rx per Dr Lenna Gilford.  Nothing further needed.

## 2017-09-03 DIAGNOSIS — Z961 Presence of intraocular lens: Secondary | ICD-10-CM | POA: Diagnosis not present

## 2017-09-03 DIAGNOSIS — H35353 Cystoid macular degeneration, bilateral: Secondary | ICD-10-CM | POA: Diagnosis not present

## 2017-09-03 DIAGNOSIS — H35363 Drusen (degenerative) of macula, bilateral: Secondary | ICD-10-CM | POA: Diagnosis not present

## 2017-09-03 DIAGNOSIS — H353131 Nonexudative age-related macular degeneration, bilateral, early dry stage: Secondary | ICD-10-CM | POA: Diagnosis not present

## 2017-09-03 DIAGNOSIS — Z947 Corneal transplant status: Secondary | ICD-10-CM | POA: Diagnosis not present

## 2017-09-03 DIAGNOSIS — H2512 Age-related nuclear cataract, left eye: Secondary | ICD-10-CM | POA: Diagnosis not present

## 2017-09-08 ENCOUNTER — Telehealth: Payer: Self-pay | Admitting: Family Medicine

## 2017-09-08 NOTE — Telephone Encounter (Unsigned)
Copied from [place] [phone]. Topic: Inquiry >> Sep 08, 2017  4:06 PM Boyd Kerbs wrote: Reason for CRM: patient is wanting labs done when she comes to see dr on 12/3. Protocol shows office will schedule.

## 2017-09-10 ENCOUNTER — Ambulatory Visit: Payer: Medicare Other

## 2017-09-10 NOTE — Telephone Encounter (Signed)
Spoke with patient confirmed appointment, advised she can come in early before appointment to have blood drawn.   Patient voiced understanding

## 2017-09-11 ENCOUNTER — Other Ambulatory Visit: Payer: Self-pay | Admitting: Pulmonary Disease

## 2017-09-15 ENCOUNTER — Other Ambulatory Visit: Payer: Self-pay

## 2017-09-15 ENCOUNTER — Encounter: Payer: Self-pay | Admitting: Emergency Medicine

## 2017-09-15 ENCOUNTER — Ambulatory Visit: Payer: Self-pay

## 2017-09-15 ENCOUNTER — Ambulatory Visit (INDEPENDENT_AMBULATORY_CARE_PROVIDER_SITE_OTHER): Payer: Medicare Other | Admitting: Emergency Medicine

## 2017-09-15 VITALS — BP 142/88 | HR 71 | Temp 98.0°F | Resp 16 | Ht 63.5 in | Wt 133.2 lb

## 2017-09-15 DIAGNOSIS — R42 Dizziness and giddiness: Secondary | ICD-10-CM | POA: Diagnosis not present

## 2017-09-15 MED ORDER — MECLIZINE HCL 25 MG PO TABS: 25.0000 mg | ORAL_TABLET | Freq: Three times a day (TID) | ORAL | 0 refills | Status: DC | PRN

## 2017-09-15 NOTE — Patient Instructions (Addendum)
   IF you received an x-ray today, you will receive an invoice from [facility]. Please contact [facility] at [phone] with questions or concerns regarding your invoice.   IF you received labwork today, you will receive an invoice from [facility]. Please contact [facility] at [phone] with questions or concerns regarding your invoice.   Our billing staff will not be able to assist you with questions regarding bills from these companies.  You will be contacted with the lab results as soon as they are available. The fastest way to get your results is to activate your My Chart account. Instructions are located on the last page of this paperwork. If you have not heard from us regarding the results in 2 weeks, please contact this office.    Dizziness Dizziness is a common problem. It makes you feel unsteady or light-headed. You may feel like you are about to pass out (faint). Dizziness can lead to getting hurt if you stumble or fall. Dizziness can be caused by many things, including:  Medicines.  Not having enough water in your body (dehydration).  Illness.  Follow these instructions at home: Eating and drinking  Drink enough fluid to keep your pee (urine) clear or pale yellow. This helps to keep you from getting dehydrated. Try to drink more clear fluids, such as water.  Do not drink alcohol.  Limit how much caffeine you drink or eat, if your doctor tells you to do that.  Limit how much salt (sodium) you drink or eat, if your doctor tells you to do that. Activity  Avoid making quick movements. ? When you stand up from sitting in a chair, steady yourself until you feel okay. ? In the morning, first sit up on the side of the bed. When you feel okay, stand slowly while you hold onto something. Do this until you know that your balance is fine.  If you need to stand in one place for a long time, move your legs often. Tighten and relax the muscles in your legs  while you are standing.  Do not drive or use heavy machinery if you feel dizzy.  Avoid bending down if you feel dizzy. Place items in your home so you can reach them easily without leaning over. Lifestyle  Do not use any products that contain nicotine or tobacco, such as cigarettes and e-cigarettes. If you need help quitting, ask your doctor.  Try to lower your stress level. You can do this by using methods such as yoga or meditation. Talk with your doctor if you need help. General instructions  Watch your dizziness for any changes.  Take over-the-counter and prescription medicines only as told by your doctor. Talk with your doctor if you think that you are dizzy because of a medicine that you are taking.  Tell a friend or a family member that you are feeling dizzy. If he or she notices any changes in your behavior, have this person call your doctor.  Keep all follow-up visits as told by your doctor. This is important. Contact a doctor if:  Your dizziness does not go away.  Your dizziness or light-headedness gets worse.  You feel sick to your stomach (nauseous).  You have trouble hearing.  You have new symptoms.  You are unsteady on your feet.  You feel like the room is spinning. Get help right away if:  You throw up (vomit) or have watery poop (diarrhea), and you cannot eat or drink anything.  You have trouble: ?   Talking. ? Walking. ? Swallowing. ? Using your arms, hands, or legs.  You feel generally weak.  You are not thinking clearly, or you have trouble forming sentences. A friend or family member may notice this.  You have: ? Chest pain. ? Pain in your belly (abdomen). ? Shortness of breath. ? Sweating.  Your vision changes.  You are bleeding.  You have a very bad headache.  You have neck pain or a stiff neck.  You have a fever. These symptoms may be an emergency. Do not wait to see if the symptoms will go away. Get medical help right away. Call  your local emergency services (911 in the [place] Do not drive yourself to the hospital. Summary  Dizziness makes you feel unsteady or light-headed. You may feel like you are about to pass out (faint).  Drink enough fluid to keep your pee (urine) clear or pale yellow. Do not drink alcohol.  Avoid making quick movements if you feel dizzy.  Watch your dizziness for any changes. This information is not intended to replace advice given to you by your health care provider. Make sure you discuss any questions you have with your health care provider. Document Released: 10/02/2011 Document Revised: 10/30/2016 Document Reviewed: 10/30/2016 Elsevier Interactive Patient Education  2017 Elsevier Inc.  

## 2017-09-15 NOTE — Assessment & Plan Note (Signed)
No red flag signs or symptoms; no signs of CVA on exam; she thinks it could be her inner ear and she's had similar symptoms in the past; will treat symptomatically and follow up in 1 week.
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
## 2017-09-15 NOTE — Telephone Encounter (Signed)
  Reason for Disposition . [1] MODERATE dizziness (e.g., interferes with normal activities) AND [2] has NOT been evaluated by physician for this  (Exception: dizziness caused by heat exposure, sudden standing, or poor fluid intake)  Answer Assessment - Initial Assessment Questions 1. DESCRIPTION: "Describe your dizziness."     Dizzy with movement or bending over. 2. LIGHTHEADED: "Do you feel lightheaded?" (e.g., somewhat faint, woozy, weak upon standing)     Weak with standing 3. VERTIGO: "Do you feel like either you or the room is spinning or tilting?" (i.e. vertigo)     No 4. SEVERITY: "How bad is it?"  "Do you feel like you are going to faint?" "Can you stand and walk?"   - MILD - walking normally   - MODERATE - interferes with normal activities (e.g., work, school)    - SEVERE - unable to stand, requires support to walk, feels like passing out now.      Moderate 5. ONSET:  "When did the dizziness begin?"     When she started Gabapentin 2 weeks ago. 6. AGGRAVATING FACTORS: "Does anything make it worse?" (e.g., standing, change in head position)     Movement makes it worse 7. HEART RATE: "Can you tell me your heart rate?" "How many beats in 15 seconds?"  (Note: not all patients can do this)       Feels steady 8. CAUSE: "What do you think is causing the dizziness?"      Don't know 9. RECURRENT SYMPTOM: "Have you had dizziness before?" If so, ask: "When was the last time?" "What happened that time?"     No 10. OTHER SYMPTOMS: "Do you have any other symptoms?" (e.g., fever, chest pain, vomiting, diarrhea, bleeding)       No 11. PREGNANCY: "Is there any chance you are pregnant?" "When was your last menstrual period?"       No  Protocols used: DIZZINESS [facility]

## 2017-09-16 ENCOUNTER — Encounter: Payer: Self-pay | Admitting: Radiology

## 2017-09-16 LAB — COMPREHENSIVE METABOLIC PANEL
ALK PHOS: 79 IU/L (ref 39–117)
ALT: 8 IU/L (ref 0–32)
AST: 16 IU/L (ref 0–40)
Albumin/Globulin Ratio: 1.7 (ref 1.2–2.2)
Albumin: 4.3 g/dL (ref 3.5–4.7)
BUN/Creatinine Ratio: 16 (ref 12–28)
BUN: 15 mg/dL (ref 8–27)
Bilirubin Total: 0.5 mg/dL (ref 0.0–1.2)
CHLORIDE: 103 mmol/L (ref 96–106)
CO2: 23 mmol/L (ref 20–29)
CREATININE: 0.92 mg/dL (ref 0.57–1.00)
Calcium: 9.3 mg/dL (ref 8.7–10.3)
GFR calc Af Amer: 65 mL/min/{1.73_m2} (ref >59)
GFR calc non Af Amer: 57 mL/min/{1.73_m2} — ABNORMAL LOW (ref >59)
GLOBULIN, TOTAL: 2.6 g/dL (ref 1.5–4.5)
GLUCOSE: 104 mg/dL — AB (ref 65–99)
POTASSIUM: 4.4 mmol/L (ref 3.5–5.2)
SODIUM: 140 mmol/L (ref 134–144)
Total Protein: 6.9 g/dL (ref 6.0–8.5)

## 2017-09-16 LAB — CBC WITH DIFFERENTIAL/PLATELET
BASOS ABS: 0 10*3/uL (ref 0.0–0.2)
BASOS: 0 %
EOS (ABSOLUTE): 0.3 10*3/uL (ref 0.0–0.4)
Eos: 3 %
HEMOGLOBIN: 13.4 g/dL (ref 11.1–15.9)
Hematocrit: 42.5 % (ref 34.0–46.6)
IMMATURE GRANS (ABS): 0 10*3/uL (ref 0.0–0.1)
IMMATURE GRANULOCYTES: 1 %
LYMPHS: 12 %
Lymphocytes Absolute: 1.1 10*3/uL (ref 0.7–3.1)
MCH: 28.2 pg (ref 26.6–33.0)
MCHC: 31.5 g/dL (ref 31.5–35.7)
MCV: 90 fL (ref 79–97)
MONOCYTES: 8 %
Monocytes Absolute: 0.7 10*3/uL (ref 0.1–0.9)
NEUTROS PCT: 76 %
Neutrophils Absolute: 6.5 10*3/uL (ref 1.4–7.0)
PLATELETS: 276 10*3/uL (ref 150–379)
RBC: 4.75 x10E6/uL (ref 3.77–5.28)
RDW: 16.3 % — ABNORMAL HIGH (ref 12.3–15.4)
WBC: 8.6 10*3/uL (ref 3.4–10.8)

## 2017-09-21 ENCOUNTER — Telehealth: Payer: Self-pay

## 2017-09-21 NOTE — Telephone Encounter (Signed)
Called patient to schedule her AWV and patient declined. She has an upcoming appointment with her PCP and would rather have it completed at that visit with her provider.

## 2017-09-22 ENCOUNTER — Other Ambulatory Visit: Payer: Self-pay | Admitting: Pulmonary Disease

## 2017-09-28 ENCOUNTER — Ambulatory Visit (INDEPENDENT_AMBULATORY_CARE_PROVIDER_SITE_OTHER): Payer: Medicare Other

## 2017-09-28 ENCOUNTER — Ambulatory Visit (INDEPENDENT_AMBULATORY_CARE_PROVIDER_SITE_OTHER): Payer: Medicare Other | Admitting: Family Medicine

## 2017-09-28 ENCOUNTER — Other Ambulatory Visit: Payer: Self-pay

## 2017-09-28 ENCOUNTER — Encounter: Payer: Self-pay | Admitting: Family Medicine

## 2017-09-28 VITALS — BP 126/88 | HR 72 | Temp 98.5°F | Resp 16 | Ht 63.5 in | Wt 132.0 lb

## 2017-09-28 DIAGNOSIS — G609 Hereditary and idiopathic neuropathy, unspecified: Secondary | ICD-10-CM

## 2017-09-28 DIAGNOSIS — G629 Polyneuropathy, unspecified: Secondary | ICD-10-CM

## 2017-09-28 DIAGNOSIS — R6889 Other general symptoms and signs: Secondary | ICD-10-CM

## 2017-09-28 DIAGNOSIS — Z23 Encounter for immunization: Secondary | ICD-10-CM

## 2017-09-28 DIAGNOSIS — L820 Inflamed seborrheic keratosis: Secondary | ICD-10-CM

## 2017-09-28 DIAGNOSIS — E7800 Pure hypercholesterolemia, unspecified: Secondary | ICD-10-CM | POA: Diagnosis not present

## 2017-09-28 DIAGNOSIS — Z1211 Encounter for screening for malignant neoplasm of colon: Secondary | ICD-10-CM | POA: Diagnosis not present

## 2017-09-28 DIAGNOSIS — M810 Age-related osteoporosis without current pathological fracture: Secondary | ICD-10-CM | POA: Diagnosis not present

## 2017-09-28 DIAGNOSIS — R634 Abnormal weight loss: Secondary | ICD-10-CM | POA: Diagnosis not present

## 2017-09-28 DIAGNOSIS — M19012 Primary osteoarthritis, left shoulder: Secondary | ICD-10-CM | POA: Diagnosis not present

## 2017-09-28 DIAGNOSIS — Z0000 Encounter for general adult medical examination without abnormal findings: Secondary | ICD-10-CM | POA: Diagnosis not present

## 2017-09-28 DIAGNOSIS — H5410 Blindness, one eye, low vision other eye, unspecified eyes: Secondary | ICD-10-CM

## 2017-09-28 DIAGNOSIS — M2550 Pain in unspecified joint: Secondary | ICD-10-CM | POA: Diagnosis not present

## 2017-09-28 DIAGNOSIS — R7303 Prediabetes: Secondary | ICD-10-CM | POA: Diagnosis not present

## 2017-09-28 DIAGNOSIS — M546 Pain in thoracic spine: Secondary | ICD-10-CM | POA: Diagnosis not present

## 2017-09-28 DIAGNOSIS — M5136 Other intervertebral disc degeneration, lumbar region: Secondary | ICD-10-CM | POA: Diagnosis not present

## 2017-09-28 DIAGNOSIS — Z780 Asymptomatic menopausal state: Secondary | ICD-10-CM

## 2017-09-28 DIAGNOSIS — J449 Chronic obstructive pulmonary disease, unspecified: Secondary | ICD-10-CM | POA: Diagnosis not present

## 2017-09-28 DIAGNOSIS — M7581 Other shoulder lesions, right shoulder: Secondary | ICD-10-CM

## 2017-09-28 DIAGNOSIS — K219 Gastro-esophageal reflux disease without esophagitis: Secondary | ICD-10-CM

## 2017-09-28 DIAGNOSIS — R490 Dysphonia: Secondary | ICD-10-CM | POA: Diagnosis not present

## 2017-09-28 LAB — POCT URINALYSIS DIP (MANUAL ENTRY)
Bilirubin, UA: NEGATIVE
Blood, UA: NEGATIVE
Glucose, UA: NEGATIVE mg/dL
LEUKOCYTES UA: NEGATIVE
Nitrite, UA: NEGATIVE
PH UA: 5.5 (ref 5.0–8.0)
PROTEIN UA: NEGATIVE mg/dL
SPEC GRAV UA: 1.02 (ref 1.010–1.025)
Urobilinogen, UA: 0.2 E.U./dL

## 2017-09-28 LAB — POCT SKIN KOH: Skin KOH, POC: NEGATIVE

## 2017-09-28 MED ORDER — DICLOFENAC SODIUM 75 MG PO TBEC: 75.0000 mg | DELAYED_RELEASE_TABLET | Freq: Every day | ORAL | 1 refills | Status: DC | PRN

## 2017-09-28 MED ORDER — ACETAMINOPHEN-CODEINE #3 300-30 MG PO TABS: 1.0000 | ORAL_TABLET | ORAL | 1 refills | Status: DC | PRN

## 2017-09-28 MED ORDER — AMITRIPTYLINE HCL 10 MG PO TABS: 10.0000 mg | ORAL_TABLET | Freq: Every day | ORAL | 0 refills | Status: DC

## 2017-09-28 NOTE — Patient Instructions (Addendum)
You need to schedule your DEXA bone scan.  Please call [place] at [phone] to schedule OR [facility] - [phone]  ICE your shoulders for 20 minutes 3 times a day.  Take the diclofenac every morning for a week for your shoulders - then just take it before a day out or a busy day. If it does not improve, you would likely benefit a lot from seeing physical therapy.   IF you received an x-ray today, you will receive an invoice from [facility]. Please contact [facility] at [phone] with questions or concerns regarding your invoice.   IF you received labwork today, you will receive an invoice from [place]. Please contact [facility] at [phone] with questions or concerns regarding your invoice.   Our billing staff will not be able to assist you with questions regarding bills from these companies.  You will be contacted with the lab results as soon as they are available. The fastest way to get your results is to activate your My Chart account. Instructions are located on the last page of this paperwork. If you have not heard from [place] regarding the results in 2 weeks, please contact this office.     Rotator Cuff Tendinitis Rotator cuff tendinitis is inflammation of the tough, cord-like bands that connect muscle to bone (tendons) in the rotator cuff. The rotator cuff includes all of the muscles and tendons that connect the arm to the shoulder. The rotator cuff holds the head of the upper arm bone (humerus) in the cup (fossa) of the shoulder blade (scapula). This condition can lead to a long-lasting (chronic) tear. The tear may be partial or complete. What are the causes? This condition is usually caused by overusing the rotator cuff. What increases the risk? This condition is more likely to develop in athletes and workers who frequently use their shoulder or reach over their heads. This can include activities such  as:  Tennis.  Baseball or softball.  Swimming.  Construction work.  Painting.  What are the signs or symptoms? Symptoms of this condition include:  Pain spreading (radiating) from the shoulder to the upper arm.  Swelling and tenderness in front of the shoulder.  Pain when reaching, pulling, or lifting the arm above the head.  Pain when lowering the arm from above the head.  Minor pain in the shoulder when resting.  Increased pain in the shoulder at night.  Difficulty placing the arm behind the back.  How is this diagnosed? This condition is diagnosed with a medical history and physical exam. Tests may also be done, including:  X-rays.  MRI.  Ultrasounds.  CT or MR arthrogram. During this test, a contrast material is injected and then images are taken.  How is this treated? Treatment for this condition depends on the severity of the condition. In less severe cases, treatment may include:  Rest. This may be done with a sling that holds the shoulder still (immobilization). Your health care provider may also recommend avoiding activities that involve lifting your arm over your head.  Icing the shoulder.  Anti-inflammatory medicines, such as aspirin or ibuprofen.  In more severe cases, treatment may include:  Physical therapy.  Steroid injections.  Surgery.  Follow these instructions at home: If you have a sling:  Wear the sling as told by your health care provider. Remove it only as told by your health care provider.  Loosen the sling if your fingers tingle, become numb, or turn cold  and blue.  Keep the sling clean.  If the sling is not waterproof, do not let it get wet. Remove it, if allowed, or cover it with a watertight covering when you take a bath or shower. Managing pain, stiffness, and swelling  If directed, put ice on the injured area. ? If you have a removable sling, remove it as told by your health care provider. ? Put ice in a plastic  bag. ? Place a towel between your skin and the bag. ? Leave the ice on for 20 minutes, 2-3 times a day.  Move your fingers often to avoid stiffness and to lessen swelling.  Raise (elevate) the injured area above the level of your heart while you are lying down.  Find a comfortable sleeping position or sleep on a recliner, if available. Driving  Do not drive or use heavy machinery while taking prescription pain medicine.  Ask your health care provider when it is safe to drive if you have a sling on your arm. Activity  Rest your shoulder as told by your health care provider.  Return to your normal activities as told by your health care provider. Ask your health care provider what activities are safe for you.  Do any exercises or stretches as told by your health care provider.  If you do repetitive overhead tasks, take small breaks in between and include stretching exercises as told by your health care provider. General instructions  Do not use any products that contain nicotine or tobacco, such as cigarettes and e-cigarettes. These can delay healing. If you need help quitting, ask your health care provider.  Take over-the-counter and prescription medicines only as told by your health care provider.  Keep all follow-up visits as told by your health care provider. This is important. Contact a health care provider if:  Your pain gets worse.  You have new pain in your arm, hands, or fingers.  Your pain is not relieved with medicine or does not get better after 6 weeks of treatment.  You have cracking sensations when moving your shoulder in certain directions.  You hear a snapping sound after using your shoulder, followed by severe pain and weakness. Get help right away if:  Your arm, hand, or fingers are numb or tingling.  Your arm, hand, or fingers are swollen or painful or they turn white or blue. Summary  Rotator cuff tendinitis is inflammation of the tough, cord-like  bands that connect muscle to bone (tendons) in the rotator cuff.  This condition is usually caused by overusing the rotator cuff, which includes all of the muscles and tendons that connect the arm to the shoulder.  This condition is more likely to develop in athletes and workers who frequently use their shoulder or reach over their heads.  Treatment generally includes rest, anti-inflammatory medicines, and icing. In some cases, physical therapy and steroid injections may be needed. In severe cases, surgery may be needed. This information is not intended to replace advice given to you by your health care provider. Make sure you discuss any questions you have with your health care provider. Document Released: 01/03/2004 Document Revised: 09/29/2016 Document Reviewed: 09/29/2016 Elsevier Interactive Patient Education  2017 Tina Mercado. [place] , Thank you for taking time to come for your Medicare Wellness Visit. I appreciate your ongoing commitment to your health goals. Please review the following plan we discussed and let me know if I can assist you in the future.   These are the  goals we discussed: Goals    None      This is a list of the screening recommended for you and due dates:  Health Maintenance  Topic Date Due  . DEXA scan (bone density measurement)  12/09/1995  . Pneumonia vaccines (2 of 2 - PPSV23) 07/07/2015  . Tetanus Vaccine  04/01/2026  . Flu Shot  Completed

## 2017-09-28 NOTE — Progress Notes (Signed)
Subjective:   Chief Complaint  Patient presents with  . Annual Exam    to discuss neuropathy      Tina Mercado is a 83 y.o. female who presents for Medicare Annual/Subsequent preventive examination. This is the first wellness exam/CPE she has had in our system. I have not seen pt in >18 mos - last visit 03/10/16 for groin skin lesion which was benign.  Preventive Screening-Counseling & Management  Tobacco Social History   Tobacco Use  Smoking Status Never Smoker  Smokeless Tobacco Never Used     Problems Prior to Visit 1. HLD - on Crestor 10 from Dr. Stanford Breed, cardiology. Last lipids 5 mos prior - 05/04/17 LDL 84, non-HDL 107 2. Osteoporosis - no prior dexa in chart??? On vit D 2000u/d otc? Vit D 78 11/2012 - pt not sure she has ever had one. 3. COPD mixed type GOLD stage 2-3: no h/o smoking. Advair 115-21 2 puffs bid (feels better on it, sxs improved) followed by pulmonology Dr. Teressa Lower q6 mos. Nml alpha-1 antitrypsin. Also instructed to stay on vigorous antireflux regimen with ppi and h2 blocker as well as lifestyle interventions.  She thinks the Advair might be making her hoarseness worse. Her breathing is fine - no limitations 4. Allergies - low dose antihistamines rase BP per pt and can't use nasal steroids due to eyes.  5. Laryngopharyngeal reflux and moderate size Hiatal hernia: on nexium 40 qd and pepcid qhs. 6. Anemia - none, now, hgb nml 7. HTN?? - takes hctz 6.25 sporadically PRN - only a few times a year? HTN resolved a few yrs after her husband died -was likely elevate due to stress/adjustment 8. Poor vision-- Dr. Cordelia Pen and Dr. Susa Simmonds at [facility] - extensive ocular hx - "history of CME OD, ocular hypertension. She is s/p PK on 04/24/15 by Dr. Susa Simmonds. 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 Susa Simmonds, cataract OS, and PC IOL OD." 9. Dizziness - seen by my colleague Dr. Mitchel Honour 2 wks ago - thought to likely be inner ear - no vertigo sxs but was  intermittent, positional - has had similar sxs which self-resolved in past so placed on meclizine 25 tid prn. Started when she began the gabapentin 1 mo ago. Has now copmletely resolved after 3 meclizine. 10. Pre-DM - dx'd 08/2016 1 yr prior. Last hgba1c 5.8 5 mos prior 11.  LE neuropathy/Lt>Rt sciatica and back pain/muscle spasms chronically - failed tylenol, tramadol, baclofen, methocarbamol (worried that caused pedal pitting edema and hyperpigmentation at her ankles - that she became allergic to the latter). Ibuprofen daily did help over sev mos but can't take due to large [place] and LRD which exacerbates COPD/DOE. Gabapentin 100 qhs made her dizzy (see visit 11/20).  Saw ortho and neuro (Dr. Rexene Alberts 06/2013) many yrs ago for this - nml lab w/u - did have mildly/low + RF with very mildly elev ESR 34 and nml CRP. Nml vit B12 488 04/2013. 06/10/13: "H/o severe left sciatic pain and chronic mild right sided sciatica so started taking ibuprofen daily and sciatic pain finely resolved after months.  Tingling, burning, stinging sensations in legs for over a year - started in anterior lower shin - like sunburn and gradually worsened in feet and spread up legs, then started in arms and now around lower face in neck and flushed feeling in face, moderate in arms but occasionally bad in arms.  Waxes and wanes - every few days and sometimes severe." 03/10/16: "For much  of her life, >40 yrs, she's had random back spasms it starts on the right and progressed bilaterally debilitating. Equates them to labor pains and can't move until they have resolved after some minutes. She is not having increased thoracic mid line pain rating bilaterally up neck to shoulders under scapula.  Varies by day, not dependent on activity level, rest, weather. minimally responsive to tylenol not responsive to ibuprofen or alleve. Worsening to now keeping her from doing her activities. Fear that she may trigger the pain." Had c-spine CT 03/2016 multilevel  OA change worse at C5-6, nml alignment. 04/01/16 CT chest/abd/pelvis notes osteopenia, small T10 hemangioma, mild deg changes of spine. 09/2014 xray mild Rt and mod Lt hip OA - no other spine imaging Baclofen and tramadol made no difference so just stopped taking for her back pain.  Wonders about ER tylenol. Has to stop when she is doing a lot of walking, bending forward, carrying.   When she went up to the 271m on the gabapentin she got temp sensation, low back pain, dizzy, achy after 2 weeks so went back down to 1059mbut the dizzyness stayed so stopped taking it and came in after she was off of it for 2 weeks. She wondered if it might have made her memory a little foggy. The 20067meally did help significantly more than the 100m24mBoth feet to mid shin on the anterior aspect - less on the posterior. Sometimes she can't even lay her back of the foot or her sole on the mattress due to burning. She does have a little on the tops of her arms, her neck, her face somtimes. More at night but perhaps she isn't paying as much attention to it - it wakes her up at night with the burning.    12.  H/o mild-mod MR - last echo 03/25/2016 showed EF 55-60%, mild LVH, grade 1 diastolic dysfunction, trivial regurgitation.  relatively nml, sees cardiology/Dr. CrenStanford Breedmos.   Current Problems (verified) Patient Active Problem List   Diagnosis Date Noted  . Dizziness 09/15/2017  . Neuropathy 07/16/2017  . COPD mixed type (HCC)[place]/14/2016  . Laryngopharyngeal reflux (LPR) 04/10/2015  . Chronic laryngitis 08/17/2014  . Blindness and low vision 04/13/2013  . Osteoporosis 12/23/2012  . Bronchitis 10/30/2011  . Benign hypertensive heart disease without heart failure 08/05/2011  . Pure hypercholesterolemia 08/05/2011  . Mitral regurgitation - still takes amox 2g 1 yr prior to dental procedure? Despite change in recs??   . History of anemia   . History of diastolic dysfunction   . Reflux   . History of seasonal  allergies     Medications Prior to Visit Current Outpatient Medications on File Prior to Visit  Medication Sig Dispense Refill  . acetaminophen (TYLENOL) 500 MG tablet Take 500 mg by mouth every 6 (six) hours as needed.    . ADMarland KitchenAIR HFA 115-21 MCG/ACT inhaler TAKE 2 PUFFS BY MOUTH TWICE A DAY 12 Inhaler 11  . amoxicillin (AMOXIL) 500 MG capsule Take 4 capsules by mouth one hour prior to dental procedure    . aspirin EC 81 MG tablet Take 81 mg by mouth daily.      . baclofen (LIORESAL) 10 MG tablet Take 1/2 to 1 tablet by mouth three times daily as needed for back spasms (Patient not taking: Reported on 09/15/2017) 50 tablet 1  . bromfenac (XIBROM) 0.09 % ophthalmic solution Place 1 drop into the right eye daily.     . cholecalciferol (  VITAMIN D) 1000 UNITS tablet Take 2,000 Units by mouth daily.     Mariane Baumgarten Calcium (STOOL SOFTENER PO) Take 1 capsule by mouth daily as needed (for mild constipation).     Marland Kitchen esomeprazole (NEXIUM) 40 MG capsule TAKE ONE CAPSULE BY MOUTH EVERY DAY BEFORE SUPPER 90 capsule 1  . fluocinonide cream (LIDEX) 8.12 % Apply 1 application topically 2 (two) times daily.     Marland Kitchen gabapentin (NEURONTIN) 100 MG capsule TAKE AS DIRECTED (Patient not taking: Reported on 09/15/2017) 90 capsule 0  . hydrochlorothiazide (HYDRODIURIL) 12.5 MG tablet Take 6.25 mg by mouth daily as needed.    Marland Kitchen ibuprofen (ADVIL,MOTRIN) 200 MG tablet Take 200 mg by mouth every 6 (six) hours as needed for headache or mild pain (for arthritis pain).     . meclizine (ANTIVERT) 25 MG tablet Take 1 tablet (25 mg total) by mouth 3 (three) times daily as needed for dizziness. 30 tablet 0  . rosuvastatin (CRESTOR) 10 MG tablet Take 1 tablet (10 mg total) by mouth daily. 90 tablet 3  . timolol (BETIMOL) 0.5 % ophthalmic solution Place 1 drop into both eyes 2 (two) times daily.    . traMADol (ULTRAM) 50 MG tablet Take 1 tablet (50 mg total) by mouth every 6 (six) hours as needed for moderate pain. (Patient not  taking: Reported on 09/15/2017) 90 tablet 1   No current facility-administered medications on file prior to visit.     Current Medications (verified) Current Outpatient Medications  Medication Sig Dispense Refill  . acetaminophen (TYLENOL) 500 MG tablet Take 500 mg by mouth every 6 (six) hours as needed.    Marland Kitchen ADVAIR HFA 115-21 MCG/ACT inhaler TAKE 2 PUFFS BY MOUTH TWICE A DAY 12 Inhaler 11  . amoxicillin (AMOXIL) 500 MG capsule Take 4 capsules by mouth one hour prior to dental procedure    . aspirin EC 81 MG tablet Take 81 mg by mouth daily.      . baclofen (LIORESAL) 10 MG tablet Take 1/2 to 1 tablet by mouth three times daily as needed for back spasms (Patient not taking: Reported on 09/15/2017) 50 tablet 1  . bromfenac (XIBROM) 0.09 % ophthalmic solution Place 1 drop into the right eye daily.     . cholecalciferol (VITAMIN D) 1000 UNITS tablet Take 2,000 Units by mouth daily.     Mariane Baumgarten Calcium (STOOL SOFTENER PO) Take 1 capsule by mouth daily as needed (for mild constipation).     Marland Kitchen esomeprazole (NEXIUM) 40 MG capsule TAKE ONE CAPSULE BY MOUTH EVERY DAY BEFORE SUPPER 90 capsule 1  . fluocinonide cream (LIDEX) 7.51 % Apply 1 application topically 2 (two) times daily.     Marland Kitchen gabapentin (NEURONTIN) 100 MG capsule TAKE AS DIRECTED (Patient not taking: Reported on 09/15/2017) 90 capsule 0  . hydrochlorothiazide (HYDRODIURIL) 12.5 MG tablet Take 6.25 mg by mouth daily as needed.    Marland Kitchen ibuprofen (ADVIL,MOTRIN) 200 MG tablet Take 200 mg by mouth every 6 (six) hours as needed for headache or mild pain (for arthritis pain).     . meclizine (ANTIVERT) 25 MG tablet Take 1 tablet (25 mg total) by mouth 3 (three) times daily as needed for dizziness. 30 tablet 0  . rosuvastatin (CRESTOR) 10 MG tablet Take 1 tablet (10 mg total) by mouth daily. 90 tablet 3  . timolol (BETIMOL) 0.5 % ophthalmic solution Place 1 drop into both eyes 2 (two) times daily.    . traMADol (ULTRAM) 50 MG tablet Take  1 tablet  (50 mg total) by mouth every 6 (six) hours as needed for moderate pain. (Patient not taking: Reported on 09/15/2017) 90 tablet 1   No current facility-administered medications for this visit.      Allergies (verified) Contrast media [iodinated diagnostic agents]; Antihistamines, diphenhydramine-type; Celebrex [celecoxib]; Darvocet [propoxyphene n-acetaminophen]; Demerol; Diphenhydramine hcl; Feldene [piroxicam]; Meperidine; Propoxyphene; and Iodine-131   PAST HISTORY  Family History Family History  Problem Relation Age of Onset  . Heart disease Mother   . Emphysema Father   . Emphysema Brother   . COPD Brother     Social History Social History   Tobacco Use  . Smoking status: Never Smoker  . Smokeless tobacco: Never Used  Substance Use Topics  . Alcohol use: No    Alcohol/week: 0.0 oz     Are there smokers in your home (other than you)? No  Risk Factors Current exercise habits: The patient does not participate in regular exercise at present. . When saw pulmonology sev mos ago, Dr. Lenna Gilford noted that she was WAY to sedentary and really encouraged pt to increase activites, discussed exercise program in detail. Dietary issues discussed: tries to eat healthy but difficult when living alone so does eat more junk than she should   Cardiac risk factors: advanced age (older than 33 for men, 31 for women) and sedentary lifestyle.  Depression Screen Depression screen [facility] 2/9 09/28/2017 09/15/2017 03/10/2016  Decreased Interest 0 0 0  Down, Depressed, Hopeless 0 0 0  PHQ - 2 Score 0 0 0     Activities of Daily Living In your present state of health, do you have any difficulty performing the following activities?:  Driving? No Managing money?  No Feeding yourself? No Getting from bed to chair? No Climbing a flight of stairs? No Preparing food and eating?: No Bathing or showering? No Getting dressed: No Getting to the toilet? No Using the toilet:No Moving around from place to  place: No In the past year have you fallen or had a near fall?:No   Are you sexually active?  No  Do you have more than one partner?  No  Hearing Difficulties: No Do you often ask people to speak up or repeat themselves? No Do you experience ringing or noises in your ears? No Do you have difficulty understanding soft or whispered voices? No   Do you feel that you have a problem with memory? No  Do you often misplace items? No  Do you feel safe at home?  Yes  Cognitive Testing  Alert? Yes  Normal Appearance?Yes  Oriented to person? Yes  Place? Yes   Time? Yes  Can perform simple calculations? Yes  Displays appropriate judgment?Yes    Advanced Directives have been discussed with the patient? Yes   Daughter in [place], [place] is HCPOA  List the Names of Other Physician/Practitioners you currently use: 1.  Pulmonology Dr. Teressa Lower 2. Optho - Dr. Susa Simmonds and Dr. Cordelia Pen at [facility] 3.  Derm - ?? 4.  GI - ?? 5.  Cardiology Dr. Lubertha South. Mare Ferrari prior 6.  ENT - ?? 7.   Neuro - Dr. Rexene Alberts 8.  Ortho - ?? Indicate any recent Medical Services you may have received from other than [facility] providers in the past year (date may be approximate).  Immunization History  Administered Date(s) Administered  . Influenza Split 07/27/2014  . Influenza, High Dose Seasonal PF 08/11/2016  . Influenza,inj,Quad PF,6+ Mos 08/28/2015  . Pneumococcal Conjugate-13  07/06/2014  . Tdap 04/01/2016    Screening Tests Health Maintenance  Topic Date Due  . DEXA SCAN  12/09/1995  . PNA vac Low Risk Adult (2 of 2 - PPSV23) 07/07/2015  . INFLUENZA VACCINE  05/27/2017  . TETANUS/TDAP  04/01/2026    All answers were reviewed with the patient and necessary referrals were made:  Joselin Crandell, MD   09/28/2017   History reviewed: allergies, current medications, past family history, past medical history, past social history, past surgical history and problem list  Review of  Systems Pertinent items noted in HPI and remainder of comprehensive ROS otherwise negative.    Objective:    Body mass index is 23.02 kg/m. BP 126/88   Pulse 72   Temp 98.5 F (36.9 C)   Resp 16   Ht 5' 3.5" (1.613 m)   Wt 132 lb (59.9 kg)   SpO2 95%   BMI 23.02 kg/m  BP at home is even lower -100-130 - mostly high 110s and low 120s /high 60s-low 70  BP 126/88   Pulse 72   Temp 98.5 F (36.9 C)   Resp 16   Ht 5' 3.5" (1.613 m)   Wt 132 lb (59.9 kg)   SpO2 95%   BMI 23.02 kg/m   General Appearance:    Alert, cooperative, no distress, appears stated age  Head:    Normocephalic, without obvious abnormality, atraumatic  Eyes:    PERRL, conjunctiva/corneas clear, EOM's intact, fundi    benign, both eyes  Ears:    Normal TM's and external ear canals, both ears  Nose:   Nares normal, septum midline, mucosa normal, no drainage    or sinus tenderness  Throat:   Lips, mucosa, and tongue normal; teeth and gums normal  Neck:   Supple, symmetrical, trachea midline, no adenopathy;    thyroid:  no enlargement/tenderness/nodules; no carotid   bruit or JVD  Back:     Symmetric, no curvature, ROM normal, no CVA tenderness  Lungs:     Clear to auscultation bilaterally, respirations unlabored  Chest Wall:    No tenderness or deformity   Heart:    Regular rate and rhythm, S1 and S2 normal, no murmur, rub   or gallop  Breast Exam:    No tenderness, masses, or nipple abnormality  Abdomen:     Soft, non-tender, bowel sounds active all four quadrants,    no masses, no organomegaly        Extremities:   Extremities normal, atraumatic, no cyanosis or edema  Pulses:   2+ and symmetric all extremities  Skin:   Skin color, texture, turgor normal, no rashes or lesions  Lymph nodes:   Cervical, supraclavicular, and axillary nodes normal  Neurologic:   CNII-XII intact, normal strength, sensation and reflexes    throughout       Results for orders placed or performed in visit on 09/28/17   TSH  Result Value Ref Range   TSH 2.500 0.450 - 4.500 uIU/mL  Vitamin B12  Result Value Ref Range   Vitamin B-12 455 232 - 1,245 pg/mL  Hemoglobin A1c  Result Value Ref Range   Hgb A1c MFr Bld 6.1 (H) 4.8 - 5.6 %   Est. average glucose Bld gHb Est-mCnc 128 mg/dL  Sedimentation Rate  Result Value Ref Range   Sed Rate 13 0 - 40 mm/hr  C-reactive protein  Result Value Ref Range   CRP 1.8 0.0 - 4.9 mg/L  Rheumatoid Arthritis Profile  Result  Value Ref Range   Rhuematoid fact SerPl-aCnc 33.6 (H) 0.0 - 72.0 IU/mL   Cyclic Citrullin Peptide Ab 14 0 - 19 units  POCT urinalysis dipstick  Result Value Ref Range   Color, UA yellow yellow   Clarity, UA clear clear   Glucose, UA negative negative mg/dL   Bilirubin, UA negative negative   Ketones, POC UA trace (5) (A) negative mg/dL   Spec Grav, UA 1.020 1.010 - 1.025   Blood, UA negative negative   pH, UA 5.5 5.0 - 8.0   Protein Ur, POC negative negative mg/dL   Urobilinogen, UA 0.2 0.2 or 1.0 E.U./dL   Nitrite, UA Negative Negative   Leukocytes, UA Negative Negative  POCT Skin KOH  Result Value Ref Range   Skin KOH, POC Negative Negative   Dg Thoracic Spine 2 View  Result Date: 09/28/2017 CLINICAL DATA:  Chronic low back pain.  Decreased range of motion. EXAM: THORACIC SPINE 2 VIEWS COMPARISON:  None. FINDINGS: There is no evidence of thoracic spine fracture. Generalized osteopenia. Alignment is normal. No other significant bone abnormalities are identified. Thoracic aortic atherosclerosis.  Large hiatal hernia. IMPRESSION: No acute osseous injury of the thoracic spine. Electronically Signed   By: Kathreen Devoid   On: 09/28/2017 17:13   Dg Lumbar Spine 2-3 Views  Result Date: 09/28/2017 CLINICAL DATA:  Decreased range of motion. EXAM: LUMBAR SPINE - 2-3 VIEW COMPARISON:  None. FINDINGS: There are 5 nonrib bearing lumbar-type vertebral bodies. The vertebral body heights are maintained. There is generalized osteopenia. The alignment is  anatomic. There is no static listhesis. There is no spondylolysis. There is no acute fracture. There is degenerative disc disease with disc height loss at L2-3 and L5-S1. The SI joints are unremarkable. There is abdominal aortic atherosclerosis. IMPRESSION: 1. Mild lumbar spine spondylosis. Electronically Signed   By: Kathreen Devoid   On: 09/28/2017 17:09   Dg Shoulder Left  Result Date: 09/28/2017 CLINICAL DATA:  No known injury, left shoulder pain, initial encounter EXAM: LEFT SHOULDER - 2+ VIEW COMPARISON:  None. FINDINGS: No acute fracture or dislocation is noted. Mild degenerative changes of the acromioclavicular joint are seen. The underlying bony thorax is within normal limits. No soft tissue changes are noted. IMPRESSION: Degenerative change without acute abnormality. Electronically Signed   By: Inez Catalina M.D.   On: 09/28/2017 17:10    Assessment:     1. Medicare annual wellness visit, subsequent   2. Pure hypercholesterolemia   3. Age-related osteoporosis without current pathological fracture   4. COPD mixed type ([place])   5. Laryngopharyngeal reflux (LPR)   6. Neuropathy   7. Blindness and low vision   8. Prediabetes   9. Abnormal loss of weight   10. Decreased activity   11. Polyneuropathy   12. Idiopathic peripheral neuropathy   13. Arthralgia of multiple joints - try elavil 10 qhs. Can use diclofenac in morning before busy day/activites that flaire sxs and can try 1/2 tab tylenol #3 if pain severe after activity.   14. Special screening for malignant neoplasms, colon   15. Postmenopausal estrogen deficiency   16. Hoarseness   17. Seborrheic keratoses, inflamed - RTC for removal  18.    Right rotator cuff tendinitis - rec PT      Plan:  Ua, a1c, tsh (3-17m thyroid calcification foci B seen on CT 04/01/16), b12,  Lipids at goal 5 mos ago and she is NOT fasting today so will not recheck ?anti-ccp?, RF?, ESR/CRP? cbc  and cmp done 2 wks ago nml - eGFR 58. H/o osteoprosis - no  prior dexa - refer Needs flu shot - done annually but not yet this yr, give pneumovax-23?  Consider neuro/rheum/ortho eval for low back pain, muscle spasms, LE neuropathy. Discuss stopping crestor due to age - not indicated for primary prevention, poss worsening lower ext pain?, poss worsening mild pre-DM? Pt has cards appt 12/03/17. Thoracic/lumbar xrays?   During the course of the visit the patient was educated and counseled about appropriate screening and preventive services including:     Primary Preventative Screenings: Cervical Cancer: aged out - did have abd/pelvis CT 03/2016 which noted small calcified fibroid in uterine fundus, grossly nml uterua, but nothing in adnexa. No vag d/c, no vag bleeding, no change in bowels. STI screening: denies need Breast Cancer: aged out - last mammogram in Epic 07/2011 [facility] but 25-50% scattered fibroglandular tissue Colorectal Cancer: aged out - had a sigmoidoscopy in office with rigid scopes 40 years ago.  Tobacco use/EtOH/substances:none Bone Density:h/o osteoporosis noted but no prior dexa on chart or per pt memory - pt reports she has had a lot of falls recently and has not broken a bone in the past 3 years. Taking vit D 1000u/d. Doesn't think she gets much calcium in her diet - she does have a salad for lunch every day with some grated cheese. But not  Cardiac: Sees cardiology q6-12 mos -Dr. Stanford Breed. Echo 03/25/2016 showed EF 55-60%, mild LVH, grade 1 diastolic dysfunction, trivial regurgitation. EKG 05/04/17. Weight/Blood sugar/Diet/Exercise: BMI Readings from Last 3 Encounters:  09/15/17 23.23 kg/m  07/16/17 22.70 kg/m  05/04/17 23.70 kg/m   Lab Results  Component Value Date   HGBA1C 5.8 (H) 05/04/2017   OTC/Vit/Supp/Herbal: prn tylenol, asa EC 81, vit D 2000u/d, docusate qhs, lutein eye vitamin Dentist/Optho: Immunizations: assume she has had pneumvax-23 in past 21 yrs but do not have documentation. Immunization History   Administered Date(s) Administered  . Influenza Split 07/27/2014  . Influenza, High Dose Seasonal PF 08/11/2016  . Influenza,inj,Quad PF,6+ Mos 08/28/2015  . Pneumococcal Conjugate-13 07/06/2014  . Tdap 04/01/2016   GOT FLU SHOT IN OCTOBER 2018 at [facility] at [place].   Orders Placed This Encounter  Procedures  . DG Lumbar Spine 2-3 Views    Standing Status:   Future    Number of Occurrences:   1    Standing Expiration Date:   09/28/2018    Order Specific Question:   Reason for Exam (SYMPTOM  OR DIAGNOSIS REQUIRED)    Answer:   nki, mod-severe decreased ROM in Lt>Rt shoulder x 1 mo, peripheral neuropathy, chronic back pain    Order Specific Question:   Preferred imaging location?    Answer:   External  . DG Thoracic Spine 2 View    Standing Status:   Future    Number of Occurrences:   1    Standing Expiration Date:   09/28/2018    Order Specific Question:   Reason for Exam (SYMPTOM  OR DIAGNOSIS REQUIRED)    Answer:   nki, mod-severe decreased ROM in Lt>Rt shoulder x 1 mo, peripheral neuropathy, chronic back pain    Order Specific Question:   Preferred imaging location?    Answer:   External  . DG Shoulder Left    Standing Status:   Future    Number of Occurrences:   1    Standing Expiration Date:   09/28/2018    Order Specific Question:  Reason for Exam (SYMPTOM  OR DIAGNOSIS REQUIRED)    Answer:   nki, mod-severe decreased ROM in Lt>Rt shoulder x 1 mo, peripheral neuropathy, chronic back pain    Order Specific Question:   Preferred imaging location?    Answer:   External  . DG Bone Density    Standing Status:   Future    Standing Expiration Date:   11/29/2018    Order Specific Question:   Reason for Exam (SYMPTOM  OR DIAGNOSIS REQUIRED)    Answer:   postmenopausal estrogen deficiency    Order Specific Question:   Preferred imaging location?    Answer:   [facility]  . Pneumococcal polysaccharide vaccine 23-valent greater than or equal to 2yo subcutaneous/IM  . TSH  .  Vitamin B12  . Hemoglobin A1c  . Sedimentation Rate  . C-reactive protein  . Rheumatoid Arthritis Profile  . Cologuard  . POCT urinalysis dipstick  . POCT Skin KOH    Meds ordered this encounter  Medications  . amitriptyline (ELAVIL) 10 MG tablet    Sig: Take 1 tablet (10 mg total) by mouth at bedtime.    Dispense:  90 tablet    Refill:  0  . acetaminophen-codeine (TYLENOL #3) 300-30 MG tablet    Sig: Take 1 tablet by mouth every 4 (four) hours as needed for moderate pain.    Dispense:  30 tablet    Refill:  1  . diclofenac (VOLTAREN) 75 MG EC tablet    Sig: Take 1 tablet (75 mg total) by mouth daily as needed for mild pain (take in morning before a busy day or a day out).    Dispense:  30 tablet    Refill:  1    Diet review for nutrition referral? Yes ____  Not Indicated _x___   Patient Instructions (the written plan) was given to the patient.   Medicare Attestation I have personally reviewed: The patient's medical and social history Their use of alcohol, tobacco or illicit drugs Their current medications and supplements The patient's functional ability including ADLs,fall risks, home safety risks, cognitive, and hearing and visual impairment Diet and physical activities Evidence for depression or mood disorders  The patient's weight, height, BMI, and visual acuity have been recorded in the chart.  I have made referrals, counseling, and provided education to the patient based on review of the above and I have provided the patient with a written personalized care plan for preventive services.    Delman Cheadle, M.D. Primary Care at [facility] [address] [place], [place] [address] [phone] phone [phone] fax  10/05/17 2:34 PM   Delman Cheadle, MD   09/28/2017      HAS [employer] SECONDARY INSURANCE

## 2017-09-29 LAB — C-REACTIVE PROTEIN: CRP: 1.8 mg/L (ref 0.0–4.9)

## 2017-09-29 LAB — VITAMIN B12: Vitamin B-12: 455 pg/mL (ref 232–1245)

## 2017-09-29 LAB — RHEUMATOID ARTHRITIS PROFILE
CYCLIC CITRULLIN PEPTIDE AB: 14 U (ref 0–19)
Rhuematoid fact SerPl-aCnc: 33.6 IU/mL — ABNORMAL HIGH (ref 0.0–13.9)

## 2017-09-29 LAB — HEMOGLOBIN A1C
ESTIMATED AVERAGE GLUCOSE: 128 mg/dL
Hgb A1c MFr Bld: 6.1 % — ABNORMAL HIGH (ref 4.8–5.6)

## 2017-09-29 LAB — SEDIMENTATION RATE: Sed Rate: 13 mm/hr (ref 0–40)

## 2017-09-29 LAB — TSH: TSH: 2.5 u[IU]/mL (ref 0.450–4.500)

## 2017-10-06 ENCOUNTER — Encounter: Payer: Self-pay | Admitting: Family Medicine

## 2017-10-09 ENCOUNTER — Telehealth: Payer: Self-pay

## 2017-10-09 DIAGNOSIS — M810 Age-related osteoporosis without current pathological fracture: Secondary | ICD-10-CM

## 2017-10-09 DIAGNOSIS — E2839 Other primary ovarian failure: Secondary | ICD-10-CM

## 2017-10-09 NOTE — Telephone Encounter (Signed)
Copied from [place] [phone]. Topic: General - Other >> Oct 09, 2017  3:20 PM Synthia Innocent wrote: Reason for CRM: [facility] need a new dx, unable to use post menopausal

## 2017-10-10 ENCOUNTER — Other Ambulatory Visit: Payer: Self-pay | Admitting: Family Medicine

## 2017-10-10 DIAGNOSIS — Z780 Asymptomatic menopausal state: Secondary | ICD-10-CM

## 2017-10-10 DIAGNOSIS — E2839 Other primary ovarian failure: Secondary | ICD-10-CM

## 2017-10-12 ENCOUNTER — Ambulatory Visit: Payer: Medicare Other | Admitting: Family Medicine

## 2017-10-15 NOTE — Telephone Encounter (Signed)
What diagnosis code do they recommend for routine screening dexa scans??? - I have always used that one. Marland Kitchen Marland Kitchen

## 2017-10-16 NOTE — Addendum Note (Signed)
Addended by: Shawnee Knapp on: 10/16/2017 02:21 PM   Modules accepted: Orders

## 2017-10-16 NOTE — Telephone Encounter (Signed)
Thank you so much!  Order changed

## 2017-10-16 NOTE — Telephone Encounter (Signed)
Checked with [facility].  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  Dr. Brigitte Pulse, can you change the order to reflect above.  Thanks

## 2017-10-23 ENCOUNTER — Ambulatory Visit: Payer: Self-pay

## 2017-10-23 NOTE — Telephone Encounter (Signed)
Pt calling with elevated BP's Pt states that she has not been on any med for HTN x 2 years. She states that she has been taking old pills that she has kept. Advised to not take old med. BP 161/98 and 5 minutes later 143/102. BP taken during triage call. Pt denies chest pain, numbness or tingling on one side of body, headache, no blurred vision or weakness. She stated she had dizziness 2 weeks ago. Care advice given and Appt made for Monday at 11:20 am with her PCP. Reason for Disposition . Systolic BP  >= 435 OR Diastolic >= 686  Answer Assessment - Initial Assessment Questions 1. BLOOD PRESSURE: "What is the blood pressure?" "Did you take at least two measurements 5 minutes apart?"    1630: 149/92 left arm 142/96 right arm      Recheck: 1700: BP 161/98  And  1705: 143/102   Both left arm 2. ONSET: "When did you take your blood pressure?"    1700 and 1705 3. HOW: "How did you obtain the blood pressure?" (e.g., visiting nurse, automatic home BP monitor)     Automatic home BP machine 4. HISTORY: "Do you have a history of high blood pressure?"     Yes has not been taking anything for the past 2 years 5. MEDICATIONS: "Are you taking any medications for blood pressure?" "Have you missed any doses recently?"     No  6. OTHER SYMPTOMS: "Do you have any symptoms?" (e.g., headache, chest pain, blurred vision, difficulty breathing, weakness)     No headache, no chest pain, no blurred vision, difficulty breathing, weakness 7. PREGNANCY: "Is there any chance you are pregnant?" "When was your last menstrual period?"     n/a  Protocols used: HIGH BLOOD PRESSURE-A-AH

## 2017-10-26 ENCOUNTER — Ambulatory Visit (INDEPENDENT_AMBULATORY_CARE_PROVIDER_SITE_OTHER): Payer: Medicare Other | Admitting: Family Medicine

## 2017-10-26 ENCOUNTER — Ambulatory Visit: Payer: Medicare Other | Admitting: Family Medicine

## 2017-10-26 ENCOUNTER — Encounter: Payer: Self-pay | Admitting: Family Medicine

## 2017-10-26 ENCOUNTER — Other Ambulatory Visit: Payer: Self-pay

## 2017-10-26 VITALS — BP 150/76 | HR 70 | Temp 98.3°F | Resp 18 | Ht 63.5 in | Wt 129.6 lb

## 2017-10-26 DIAGNOSIS — G8929 Other chronic pain: Secondary | ICD-10-CM | POA: Diagnosis not present

## 2017-10-26 DIAGNOSIS — Z5181 Encounter for therapeutic drug level monitoring: Secondary | ICD-10-CM | POA: Diagnosis not present

## 2017-10-26 DIAGNOSIS — I10 Essential (primary) hypertension: Secondary | ICD-10-CM

## 2017-10-26 DIAGNOSIS — M546 Pain in thoracic spine: Secondary | ICD-10-CM | POA: Diagnosis not present

## 2017-10-26 LAB — POCT URINALYSIS DIP (MANUAL ENTRY)
BILIRUBIN UA: NEGATIVE mg/dL
Bilirubin, UA: NEGATIVE
Blood, UA: NEGATIVE
Glucose, UA: NEGATIVE mg/dL
Leukocytes, UA: NEGATIVE
Nitrite, UA: NEGATIVE
PH UA: 6.5 (ref 5.0–8.0)
PROTEIN UA: NEGATIVE mg/dL
SPEC GRAV UA: 1.015 (ref 1.010–1.025)
Urobilinogen, UA: 0.2 E.U./dL

## 2017-10-26 MED ORDER — HYDROCHLOROTHIAZIDE 12.5 MG PO TABS: 12.5000 mg | ORAL_TABLET | Freq: Every day | ORAL | 1 refills | Status: DC | PRN

## 2017-10-26 NOTE — Patient Instructions (Signed)
Restart the amitriptyline 10mg  at night.  Take 1/2 tab hctz 12.5 (=6.25) if SBP in 140s Take 1 tab hctz 12.5 if SBP in 150s Take 2 tabs hctz if SBP 160s or higher. If SBP 170s or higher, take hctz and come in to be seen asap.    Managing Your Hypertension Hypertension is commonly called high blood pressure. This is when the force of your blood pressing against the walls of your arteries is too strong. Arteries are blood vessels that carry blood from your heart throughout your body. Hypertension forces the heart to work harder to pump blood, and may cause the arteries to become narrow or stiff. Having untreated or uncontrolled hypertension can cause heart attack, stroke, kidney disease, and other problems. What are blood pressure readings? A blood pressure reading consists of a higher number over a lower number. Ideally, your blood pressure should be below 120/80. The first ("top") number is called the systolic pressure. It is a measure of the pressure in your arteries as your heart beats. The second ("bottom") number is called the diastolic pressure. It is a measure of the pressure in your arteries as the heart relaxes. What does my blood pressure reading mean? Blood pressure is classified into four stages. Based on your blood pressure reading, your health care provider may use the following stages to determine what type of treatment you need, if any. Systolic pressure and diastolic pressure are measured in a unit called mm Hg. Normal  Systolic pressure: below 332.  Diastolic pressure: below 80. Elevated  Systolic pressure: 951-884.  Diastolic pressure: below 80. Hypertension stage 1  Systolic pressure: 166-063.  Diastolic pressure: 01-60. Hypertension stage 2  Systolic pressure: 109 or above.  Diastolic pressure: 90 or above. What health risks are associated with hypertension? Managing your hypertension is an important responsibility. Uncontrolled hypertension can lead to:  A  heart attack.  A stroke.  A weakened blood vessel (aneurysm).  Heart failure.  Kidney damage.  Eye damage.  Metabolic syndrome.  Memory and concentration problems.  What changes can I make to manage my hypertension? Hypertension can be managed by making lifestyle changes and possibly by taking medicines. Your health care provider will help you make a plan to bring your blood pressure within a normal range. Eating and drinking  Eat a diet that is high in fiber and potassium, and low in salt (sodium), added sugar, and fat. An example eating plan is called the DASH (Dietary Approaches to Stop Hypertension) diet. To eat this way: ? Eat plenty of fresh fruits and vegetables. Try to fill half of your plate at each meal with fruits and vegetables. ? Eat whole grains, such as whole wheat pasta, brown rice, or whole grain bread. Fill about one quarter of your plate with whole grains. ? Eat low-fat diary products. ? Avoid fatty cuts of meat, processed or cured meats, and poultry with skin. Fill about one quarter of your plate with lean proteins such as fish, chicken without skin, beans, eggs, and tofu. ? Avoid premade and processed foods. These tend to be higher in sodium, added sugar, and fat.  Reduce your daily sodium intake. Most people with hypertension should eat less than 1,500 mg of sodium a day.  Limit alcohol intake to no more than 1 drink a day for nonpregnant women and 2 drinks a day for men. One drink equals 12 oz of beer, 5 oz of wine, or 1 oz of hard liquor. Lifestyle  Work with your health  care provider to maintain a healthy body weight, or to lose weight. Ask what an ideal weight is for you.  Get at least 30 minutes of exercise that causes your heart to beat faster (aerobic exercise) most days of the week. Activities may include walking, swimming, or biking.  Include exercise to strengthen your muscles (resistance exercise), such as weight lifting, as part of your weekly  exercise routine. Try to do these types of exercises for 30 minutes at least 3 days a week.  Do not use any products that contain nicotine or tobacco, such as cigarettes and e-cigarettes. If you need help quitting, ask your health care provider.  Control any long-term (chronic) conditions you have, such as high cholesterol or diabetes. Monitoring  Monitor your blood pressure at home as told by your health care provider. Your personal target blood pressure may vary depending on your medical conditions, your age, and other factors.  Have your blood pressure checked regularly, as often as told by your health care provider. Working with your health care provider  Review all the medicines you take with your health care provider because there may be side effects or interactions.  Talk with your health care provider about your diet, exercise habits, and other lifestyle factors that may be contributing to hypertension.  Visit your health care provider regularly. Your health care provider can help you create and adjust your plan for managing hypertension. Will I need medicine to control my blood pressure? Your health care provider may prescribe medicine if lifestyle changes are not enough to get your blood pressure under control, and if:  Your systolic blood pressure is 130 or higher.  Your diastolic blood pressure is 80 or higher.  Take medicines only as told by your health care provider. Follow the directions carefully. Blood pressure medicines must be taken as prescribed. The medicine does not work as well when you skip doses. Skipping doses also puts you at risk for problems. Contact a health care provider if:  You think you are having a reaction to medicines you have taken.  You have repeated (recurrent) headaches.  You feel dizzy.  You have swelling in your ankles.  You have trouble with your vision. Get help right away if:  You develop a severe headache or confusion.  You have  unusual weakness or numbness, or you feel faint.  You have severe pain in your chest or abdomen.  You vomit repeatedly.  You have trouble breathing. Summary  Hypertension is when the force of blood pumping through your arteries is too strong. If this condition is not controlled, it may put you at risk for serious complications.  Your personal target blood pressure may vary depending on your medical conditions, your age, and other factors. For most people, a normal blood pressure is less than 120/80.  Hypertension is managed by lifestyle changes, medicines, or both. Lifestyle changes include weight loss, eating a healthy, low-sodium diet, exercising more, and limiting alcohol. This information is not intended to replace advice given to you by your health care provider. Make sure you discuss any questions you have with your health care provider. Document Released: 07/07/2012 Document Revised: 09/10/2016 Document Reviewed: 09/10/2016 Elsevier Interactive Patient Education  [employer].
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
## 2017-10-27 LAB — BASIC METABOLIC PANEL
BUN / CREAT RATIO: 20 (ref 12–28)
BUN: 21 mg/dL (ref 8–27)
CALCIUM: 9.8 mg/dL (ref 8.7–10.3)
CHLORIDE: 101 mmol/L (ref 96–106)
CO2: 23 mmol/L (ref 20–29)
Creatinine, Ser: 1.06 mg/dL — ABNORMAL HIGH (ref 0.57–1.00)
GFR, EST AFRICAN AMERICAN: 55 mL/min/{1.73_m2} — AB (ref >59)
GFR, EST NON AFRICAN AMERICAN: 48 mL/min/{1.73_m2} — AB (ref >59)
Glucose: 115 mg/dL — ABNORMAL HIGH (ref 65–99)
POTASSIUM: 4.4 mmol/L (ref 3.5–5.2)
Sodium: 140 mmol/L (ref 134–144)

## 2017-10-28 ENCOUNTER — Encounter: Payer: Self-pay | Admitting: Family Medicine

## 2017-10-28 DIAGNOSIS — M810 Age-related osteoporosis without current pathological fracture: Secondary | ICD-10-CM | POA: Diagnosis not present

## 2017-11-12 ENCOUNTER — Ambulatory Visit (INDEPENDENT_AMBULATORY_CARE_PROVIDER_SITE_OTHER): Payer: Medicare Other | Admitting: Family Medicine

## 2017-11-12 ENCOUNTER — Encounter: Payer: Self-pay | Admitting: Family Medicine

## 2017-11-12 ENCOUNTER — Other Ambulatory Visit: Payer: Self-pay

## 2017-11-12 VITALS — BP 138/80 | HR 72 | Temp 97.8°F | Resp 16 | Ht 62.25 in | Wt 129.8 lb

## 2017-11-12 DIAGNOSIS — M810 Age-related osteoporosis without current pathological fracture: Secondary | ICD-10-CM | POA: Diagnosis not present

## 2017-11-12 DIAGNOSIS — M899 Disorder of bone, unspecified: Secondary | ICD-10-CM | POA: Diagnosis not present

## 2017-11-12 MED ORDER — DENOSUMAB 60 MG/ML ~~LOC~~ SOLN: 60.0000 mg | SUBCUTANEOUS | 1 refills | Status: DC
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
## 2017-11-12 NOTE — Patient Instructions (Addendum)
10/28/2017 DEXA scan at [facility] ->T score -3.6 at Left AND Right total femur and AP total spine.    IF you received an x-ray today, you will receive an invoice from [facility]. Please contact [facility] at [phone] with questions or concerns regarding your invoice.   IF you received labwork today, you will receive an invoice from [place]. Please contact [facility] at [phone] with questions or concerns regarding your invoice.   Our billing staff will not be able to assist you with questions regarding bills from these companies.  You will be contacted with the lab results as soon as they are available. The fastest way to get your results is to activate your My Chart account. Instructions are located on the last page of this paperwork. If you have not heard from [place] regarding the results in 2 weeks, please contact this office.     Osteoporosis Osteoporosis is the thinning and loss of density in the bones. Osteoporosis makes the bones more brittle, fragile, and likely to break (fracture). Over time, osteoporosis can cause the bones to become so weak that they fracture after a simple fall. The bones most likely to fracture are the bones in the hip, wrist, and spine. What are the causes? The exact cause is not known. What increases the risk? Anyone can develop osteoporosis. You may be at greater risk if you have a family history of the condition or have poor nutrition. You may also have a higher risk if you are:  Female.  82 years old or older.  A smoker.  Not physically active.  White or Asian.  Slender.  What are the signs or symptoms? A fracture might be the first sign of the disease, especially if it results from a fall or injury that would not usually cause a bone to break. Other signs and symptoms include:  Low back and neck pain.  Stooped posture.  Height loss.  How is this diagnosed? To make a diagnosis, your health care provider may:  Take a  medical history.  Perform a physical exam.  Order tests, such as: ? A bone mineral density test. ? A dual-energy X-ray absorptiometry test.  How is this treated? The goal of osteoporosis treatment is to strengthen your bones to reduce your risk of a fracture. Treatment may involve:  Making lifestyle changes, such as: ? Eating a diet rich in calcium. ? Doing weight-bearing and muscle-strengthening exercises. ? Stopping tobacco use. ? Limiting alcohol intake.  Taking medicine to slow the process of bone loss or to increase bone density.  Monitoring your levels of calcium and vitamin D.  Follow these instructions at home:  Include calcium and vitamin D in your diet. Calcium is important for bone health, and vitamin D helps the body absorb calcium.  Perform weight-bearing and muscle-strengthening exercises as directed by your health care provider.  Do not use any tobacco products, including cigarettes, chewing tobacco, and electronic cigarettes. If you need help quitting, ask your health care provider.  Limit your alcohol intake.  Take medicines only as directed by your health care provider.  Keep all follow-up visits as directed by your health care provider. This is important.  Take precautions at home to lower your risk of falling, such as: ? Keeping rooms well lit and clutter free. ? Installing safety rails on stairs. ? Using rubber mats in the bathroom and other areas that are often wet or slippery. Get help right away if: You fall or injure yourself. This information  is not intended to replace advice given to you by your health care provider. Make sure you discuss any questions you have with your health care provider. Document Released: 07/23/2005 Document Revised: 03/17/2016 Document Reviewed: 03/23/2014 Elsevier Interactive Patient Education  [employer].

## 2017-11-13 LAB — VITAMIN D 25 HYDROXY (VIT D DEFICIENCY, FRACTURES): VIT D 25 HYDROXY: 45 ng/mL (ref 30.0–100.0)

## 2017-11-13 LAB — PHOSPHORUS: Phosphorus: 3.3 mg/dL (ref 2.5–4.5)

## 2017-11-18 ENCOUNTER — Ambulatory Visit (INDEPENDENT_AMBULATORY_CARE_PROVIDER_SITE_OTHER)
Admission: RE | Admit: 2017-11-18 | Discharge: 2017-11-18 | Disposition: A | Discharge status: Home / Self Care | Payer: Medicare Other | Source: Ambulatory Visit | Attending: Pulmonary Disease | Admitting: Pulmonary Disease

## 2017-11-18 ENCOUNTER — Encounter: Payer: Self-pay | Admitting: Pulmonary Disease

## 2017-11-18 ENCOUNTER — Ambulatory Visit (INDEPENDENT_AMBULATORY_CARE_PROVIDER_SITE_OTHER): Payer: Medicare Other | Admitting: Pulmonary Disease

## 2017-11-18 VITALS — BP 106/70 | HR 80 | Temp 97.8°F | Ht 62.5 in | Wt 127.6 lb

## 2017-11-18 DIAGNOSIS — K219 Gastro-esophageal reflux disease without esophagitis: Secondary | ICD-10-CM | POA: Diagnosis not present

## 2017-11-18 DIAGNOSIS — I119 Hypertensive heart disease without heart failure: Secondary | ICD-10-CM

## 2017-11-18 DIAGNOSIS — I340 Nonrheumatic mitral (valve) insufficiency: Secondary | ICD-10-CM

## 2017-11-18 DIAGNOSIS — G629 Polyneuropathy, unspecified: Secondary | ICD-10-CM

## 2017-11-18 DIAGNOSIS — R05 Cough: Secondary | ICD-10-CM | POA: Diagnosis not present

## 2017-11-18 DIAGNOSIS — J449 Chronic obstructive pulmonary disease, unspecified: Secondary | ICD-10-CM

## 2017-11-18 DIAGNOSIS — K449 Diaphragmatic hernia without obstruction or gangrene: Secondary | ICD-10-CM | POA: Diagnosis not present

## 2017-11-18 MED ORDER — METHYLPREDNISOLONE 4 MG PO TABS: ORAL_TABLET | ORAL | 0 refills | Status: DC

## 2017-11-18 MED ORDER — METHYLPREDNISOLONE ACETATE 80 MG/ML IJ SUSP
80.0000 mg | Freq: Once | INTRAMUSCULAR | Status: AC
  Administered 2017-11-18: 80 mg via INTRAMUSCULAR

## 2017-11-18 NOTE — Progress Notes (Signed)
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 her a Depo shot & Medrol dosepak; for the latter we rec Nexium40 before dinner, NPO after dinner, elev HOB 6" (asked to talk to PCP regarding GI referral).   Plan:     Patient's Medications  New Prescriptions   METHYLPREDNISOLONE (MEDROL) 4 MG TABLET    Take as directed.  Previous Medications   ACETAMINOPHEN (TYLENOL) 500 MG TABLET    Take 500 mg by mouth every 6 (six) hours as needed.   ADVAIR HFA 115-21 MCG/ACT INHALER    TAKE 2 PUFFS BY MOUTH TWICE A DAY   AMITRIPTYLINE (ELAVIL) 10 MG TABLET    Take 1 tablet (10 mg total) by mouth at bedtime.   AMOXICILLIN (AMOXIL) 500 MG CAPSULE    Take 4 capsules by mouth one hour prior to dental procedure   ASPIRIN EC 81 MG TABLET    Take 81 mg by mouth daily.     BROMFENAC (XIBROM) 0.09 % OPHTHALMIC SOLUTION    Place 1 drop into the right eye daily.    CHOLECALCIFEROL (VITAMIN D) 1000 UNITS TABLET    Take 2,000 Units by mouth daily.    DICLOFENAC (VOLTAREN) 75 MG EC TABLET    Take 1 tablet (75 mg total) by mouth daily as needed for mild pain (take in morning before a busy day or a day out).   DOCUSATE CALCIUM (STOOL SOFTENER PO)    Take 1 capsule by mouth daily as needed (for mild constipation).    ESOMEPRAZOLE (NEXIUM) 40 MG CAPSULE    TAKE ONE CAPSULE BY MOUTH EVERY DAY BEFORE SUPPER   FLUOCINONIDE CREAM (LIDEX) 0.05 %    Apply 1 application topically 2 (two) times daily.    HYDROCHLOROTHIAZIDE (HYDRODIURIL) 12.5 MG TABLET    Take 1-2 tablets (12.5-25 mg total) by mouth daily as needed (elevated blood pressures).    IBUPROFEN (ADVIL,MOTRIN) 200 MG TABLET    Take 200 mg by mouth every 6 (six) hours as needed for headache or mild pain (for arthritis pain).    MECLIZINE (ANTIVERT) 25 MG TABLET    Take 1 tablet (25 mg total) by mouth 3 (three) times daily as needed for dizziness.   ROSUVASTATIN (CRESTOR) 10 MG TABLET    Take 1 tablet (10 mg total) by mouth daily.   TIMOLOL (BETIMOL) 0.5 % OPHTHALMIC SOLUTION    Place 1 drop into both eyes 2 (two) times daily.  Modified Medications   No medications on file  Discontinued Medications   DENOSUMAB (PROLIA) 60 MG/ML SOLN INJECTION    Inject 60 mg into the skin every 6 (six) months. Administer in upper arm, thigh, or abdomen

## 2017-11-18 NOTE — Patient Instructions (Signed)
Today we updated your med list in our EPIC system...    Continue your current medications the same...  Today we checked a follow up CXR...    We will contact you w/ the results when available...   For the bronchial tube inflammation & cough>     We gave you a Depo shot today...    Start the [place] in the AM 1/24 & follow the directions on the pack...  You may use the OTC DELSYM as needed for cough...  Remember to follw that antireflux regimen>    Take the [place] about 30 min before dinner in the eve...    Do not eat or drink after the dinner meal...    Elevate the head of your bed about 6"  Call for any questions or if we can be of service in any way.Marland KitchenMarland Kitchen

## 2017-11-24 ENCOUNTER — Other Ambulatory Visit: Payer: Self-pay | Admitting: Pulmonary Disease

## 2017-12-03 ENCOUNTER — Ambulatory Visit: Payer: Medicare Other | Admitting: Cardiology

## 2017-12-15 ENCOUNTER — Telehealth: Payer: Self-pay | Admitting: Pulmonary Disease

## 2017-12-15 NOTE — Telephone Encounter (Signed)
SN please advise does patient need follow up xray from last visit, she states she is feeling much better.

## 2017-12-17 NOTE — Telephone Encounter (Signed)
SN please advise. Thanks.  

## 2017-12-18 ENCOUNTER — Other Ambulatory Visit: Payer: Self-pay | Admitting: Family Medicine

## 2017-12-20 ENCOUNTER — Encounter: Payer: Self-pay | Admitting: Pulmonary Disease

## 2017-12-21 ENCOUNTER — Other Ambulatory Visit: Payer: Self-pay | Admitting: Pulmonary Disease

## 2017-12-21 ENCOUNTER — Other Ambulatory Visit: Payer: Self-pay | Admitting: *Deleted

## 2017-12-21 DIAGNOSIS — J189 Pneumonia, unspecified organism: Secondary | ICD-10-CM

## 2017-12-21 NOTE — Telephone Encounter (Signed)
Per SN ok for Patient to have follow up chest xray or follow up office visit.  Called and spoke with Patient and she stated that she would like to have follow up chest xray.  Order for chest xray placed. Nothing further needed.

## 2017-12-21 NOTE — Telephone Encounter (Signed)
Will close this message since patient sent a MyChart message this morning on the same matter.

## 2017-12-21 NOTE — Telephone Encounter (Signed)
-----   Message from [place], Generic sent at 12/20/2017 9:03 AM EST -----    DR. NADEL, YOU ASKED ME TO CALL YOU IN 3-4 WEEKS ABOUT HOW I WAS DOING. LEFT A MESSAGE WITH YOUR OFFICE LAST WEEK BUT YOU MAY NOT HAVE GOTTEN THE MESSAGE. EVERYTHING SEEMS TO BE BACK TO NORMAL, BUT MAYBE JUST A WEE BIT SOB. MY CHEST XRAY SUGGESTED A FOLLOW-UP XRAY, I REALLY WOULD LIKE TO HAVE ONE TO SEE IF ANYTHING HAS RESOLVED. [place]   SN please advise

## 2017-12-22 ENCOUNTER — Ambulatory Visit (INDEPENDENT_AMBULATORY_CARE_PROVIDER_SITE_OTHER)
Admission: RE | Admit: 2017-12-22 | Discharge: 2017-12-22 | Disposition: A | Discharge status: Home / Self Care | Payer: Medicare Other | Source: Ambulatory Visit | Attending: Pulmonary Disease | Admitting: Pulmonary Disease

## 2017-12-22 DIAGNOSIS — J189 Pneumonia, unspecified organism: Secondary | ICD-10-CM

## 2017-12-22 DIAGNOSIS — J449 Chronic obstructive pulmonary disease, unspecified: Secondary | ICD-10-CM | POA: Diagnosis not present

## 2018-01-14 ENCOUNTER — Ambulatory Visit: Payer: Medicare Other | Admitting: Pulmonary Disease

## 2018-02-15 DIAGNOSIS — H2512 Age-related nuclear cataract, left eye: Secondary | ICD-10-CM | POA: Diagnosis not present

## 2018-02-15 DIAGNOSIS — H02401 Unspecified ptosis of right eyelid: Secondary | ICD-10-CM | POA: Diagnosis not present

## 2018-02-15 DIAGNOSIS — Z961 Presence of intraocular lens: Secondary | ICD-10-CM | POA: Diagnosis not present

## 2018-02-15 DIAGNOSIS — Z947 Corneal transplant status: Secondary | ICD-10-CM | POA: Diagnosis not present

## 2018-02-15 DIAGNOSIS — H1851 Endothelial corneal dystrophy: Secondary | ICD-10-CM | POA: Diagnosis not present

## 2018-03-19 ENCOUNTER — Other Ambulatory Visit: Payer: Self-pay | Admitting: Family Medicine

## 2018-03-19 NOTE — Telephone Encounter (Signed)
amitriptyline refill Last OV: 10/26/17 Last Refill:12/21/17 #90 No RF Pharmacy:[facility] PCP: Delman Cheadle MD

## 2018-04-04 ENCOUNTER — Other Ambulatory Visit: Payer: Self-pay | Admitting: Pulmonary Disease

## 2018-04-04 ENCOUNTER — Other Ambulatory Visit: Payer: Self-pay | Admitting: Family Medicine

## 2018-04-19 ENCOUNTER — Ambulatory Visit (INDEPENDENT_AMBULATORY_CARE_PROVIDER_SITE_OTHER): Payer: Medicare Other | Admitting: Pulmonary Disease

## 2018-04-19 ENCOUNTER — Encounter: Payer: Self-pay | Admitting: Pulmonary Disease

## 2018-04-19 VITALS — BP 126/72 | HR 66 | Temp 97.6°F | Ht 62.5 in | Wt 128.8 lb

## 2018-04-19 DIAGNOSIS — K449 Diaphragmatic hernia without obstruction or gangrene: Secondary | ICD-10-CM

## 2018-04-19 DIAGNOSIS — I119 Hypertensive heart disease without heart failure: Secondary | ICD-10-CM

## 2018-04-19 DIAGNOSIS — K219 Gastro-esophageal reflux disease without esophagitis: Secondary | ICD-10-CM

## 2018-04-19 DIAGNOSIS — G629 Polyneuropathy, unspecified: Secondary | ICD-10-CM

## 2018-04-19 DIAGNOSIS — J449 Chronic obstructive pulmonary disease, unspecified: Secondary | ICD-10-CM | POA: Diagnosis not present

## 2018-04-19 DIAGNOSIS — I340 Nonrheumatic mitral (valve) insufficiency: Secondary | ICD-10-CM | POA: Diagnosis not present
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
## 2018-04-19 NOTE — Patient Instructions (Signed)
Today we updated your med list in our EPIC system...    Continue your current medications the same...  Continue your Advair-HFA 115/21 taking 2 inhalations twice daily...  I agree with your checking back w/ DrShaw/ DrWainer about getting started on the Prolia shots...  Great idea as well to start that gentle exercise program, consider "silver sneakers", and be careful- no falling allowed!  Call for any questions...  Let's plan a follow up visit in 4-86mo, sooner if needed for problems.Marland KitchenMarland Kitchen

## 2018-04-30 ENCOUNTER — Other Ambulatory Visit: Payer: Self-pay | Admitting: Family Medicine

## 2018-05-12 ENCOUNTER — Other Ambulatory Visit: Payer: Self-pay | Admitting: Cardiology

## 2018-07-10 ENCOUNTER — Other Ambulatory Visit: Payer: Self-pay | Admitting: Cardiology

## 2018-08-06 ENCOUNTER — Other Ambulatory Visit: Payer: Self-pay | Admitting: Pulmonary Disease

## 2018-09-08 ENCOUNTER — Other Ambulatory Visit: Payer: Self-pay | Admitting: Family Medicine

## 2018-09-08 DIAGNOSIS — Z961 Presence of intraocular lens: Secondary | ICD-10-CM | POA: Diagnosis not present

## 2018-09-08 DIAGNOSIS — H1851 Endothelial corneal dystrophy: Secondary | ICD-10-CM | POA: Diagnosis not present

## 2018-09-08 DIAGNOSIS — H2512 Age-related nuclear cataract, left eye: Secondary | ICD-10-CM | POA: Diagnosis not present

## 2018-09-08 DIAGNOSIS — Z947 Corneal transplant status: Secondary | ICD-10-CM | POA: Diagnosis not present

## 2018-09-08 DIAGNOSIS — H02401 Unspecified ptosis of right eyelid: Secondary | ICD-10-CM | POA: Diagnosis not present

## 2018-09-12 ENCOUNTER — Other Ambulatory Visit: Payer: Self-pay | Admitting: Cardiology

## 2018-09-13 ENCOUNTER — Ambulatory Visit (INDEPENDENT_AMBULATORY_CARE_PROVIDER_SITE_OTHER): Payer: Medicare Other | Admitting: Pulmonary Disease

## 2018-09-13 ENCOUNTER — Encounter: Payer: Self-pay | Admitting: Pulmonary Disease

## 2018-09-13 VITALS — BP 120/64 | HR 83 | Temp 97.9°F | Ht 62.5 in | Wt 130.8 lb

## 2018-09-13 DIAGNOSIS — I119 Hypertensive heart disease without heart failure: Secondary | ICD-10-CM | POA: Diagnosis not present

## 2018-09-13 DIAGNOSIS — K449 Diaphragmatic hernia without obstruction or gangrene: Secondary | ICD-10-CM | POA: Diagnosis not present

## 2018-09-13 DIAGNOSIS — G629 Polyneuropathy, unspecified: Secondary | ICD-10-CM

## 2018-09-13 DIAGNOSIS — J449 Chronic obstructive pulmonary disease, unspecified: Secondary | ICD-10-CM

## 2018-09-13 DIAGNOSIS — I340 Nonrheumatic mitral (valve) insufficiency: Secondary | ICD-10-CM

## 2018-09-13 DIAGNOSIS — K219 Gastro-esophageal reflux disease without esophagitis: Secondary | ICD-10-CM

## 2018-09-13 MED ORDER — TRAMADOL HCL 50 MG PO TABS: 50.0000 mg | ORAL_TABLET | Freq: Three times a day (TID) | ORAL | 2 refills | Status: DC | PRN
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
## 2018-09-13 NOTE — Patient Instructions (Addendum)
Today we updated your med list in our EPIC system...    Continue your current medications the same...  For your chest wall pain & coccyx pain after the fall--    Apply heat asm needed for comfort...    Use a cushion or "donut" for sitting...    Give it some time, but call if not resolving...  Continue the ADVAIR 2 inhalations twice daily...  We wrote for TRAMADOL 45m tabs to take up to 3 times daily as needed for pain & take it w/ extra strength Tylenol to boost it's effect...   Start that exercise program-- stair stepper or silver sneakers etc...  [facility]...  We discussed my up-coming retirement & decided to request DrShaw to assume the responsibility for writing your Advair going forward & to re-consult [facility] for any breathing problems in the future...  Sending you my very best wishes for good health & happiness in the coming years..Marland KitchenMarland Kitchen

## 2018-09-16 ENCOUNTER — Encounter: Payer: Self-pay | Admitting: Pulmonary Disease

## 2018-09-29 ENCOUNTER — Other Ambulatory Visit: Payer: Self-pay | Admitting: Pulmonary Disease

## 2018-10-05 DIAGNOSIS — Z23 Encounter for immunization: Secondary | ICD-10-CM | POA: Diagnosis not present

## 2018-10-15 ENCOUNTER — Other Ambulatory Visit: Payer: Self-pay

## 2018-10-15 ENCOUNTER — Encounter: Payer: Self-pay | Admitting: Family Medicine

## 2018-10-15 ENCOUNTER — Ambulatory Visit (INDEPENDENT_AMBULATORY_CARE_PROVIDER_SITE_OTHER): Payer: Medicare Other | Admitting: Family Medicine

## 2018-10-15 VITALS — BP 124/88 | HR 92 | Temp 98.0°F | Resp 16 | Ht 62.0 in | Wt 129.0 lb

## 2018-10-15 DIAGNOSIS — Z9181 History of falling: Secondary | ICD-10-CM

## 2018-10-15 DIAGNOSIS — G8929 Other chronic pain: Secondary | ICD-10-CM | POA: Diagnosis not present

## 2018-10-15 DIAGNOSIS — R2689 Other abnormalities of gait and mobility: Secondary | ICD-10-CM

## 2018-10-15 DIAGNOSIS — G629 Polyneuropathy, unspecified: Secondary | ICD-10-CM

## 2018-10-15 DIAGNOSIS — Z0001 Encounter for general adult medical examination with abnormal findings: Secondary | ICD-10-CM | POA: Diagnosis not present

## 2018-10-15 DIAGNOSIS — G250 Essential tremor: Secondary | ICD-10-CM | POA: Diagnosis not present

## 2018-10-15 DIAGNOSIS — I700 Atherosclerosis of aorta: Secondary | ICD-10-CM | POA: Diagnosis not present

## 2018-10-15 DIAGNOSIS — W19XXXS Unspecified fall, sequela: Secondary | ICD-10-CM

## 2018-10-15 DIAGNOSIS — R7303 Prediabetes: Secondary | ICD-10-CM | POA: Diagnosis not present

## 2018-10-15 DIAGNOSIS — M549 Dorsalgia, unspecified: Secondary | ICD-10-CM | POA: Diagnosis not present

## 2018-10-15 DIAGNOSIS — H9190 Unspecified hearing loss, unspecified ear: Secondary | ICD-10-CM

## 2018-10-15 DIAGNOSIS — Y92009 Unspecified place in unspecified non-institutional (private) residence as the place of occurrence of the external cause: Secondary | ICD-10-CM

## 2018-10-15 DIAGNOSIS — M810 Age-related osteoporosis without current pathological fracture: Secondary | ICD-10-CM

## 2018-10-15 LAB — POCT URINALYSIS DIP (MANUAL ENTRY)
Bilirubin, UA: NEGATIVE
Blood, UA: NEGATIVE
Glucose, UA: NEGATIVE mg/dL
Ketones, POC UA: NEGATIVE mg/dL
Leukocytes, UA: NEGATIVE
Nitrite, UA: NEGATIVE
Protein Ur, POC: NEGATIVE mg/dL
Spec Grav, UA: 1.02 (ref 1.010–1.025)
Urobilinogen, UA: 0.2 E.U./dL
pH, UA: 6 (ref 5.0–8.0)

## 2018-10-15 MED ORDER — HYDROCHLOROTHIAZIDE 12.5 MG PO TABS: 12.5000 mg | ORAL_TABLET | Freq: Every day | ORAL | 0 refills | Status: DC | PRN

## 2018-10-15 MED ORDER — AMITRIPTYLINE HCL 10 MG PO TABS: ORAL_TABLET | ORAL | 1 refills | Status: DC

## 2018-10-15 MED ORDER — ZOSTER VAC RECOMB ADJUVANTED 50 MCG/0.5ML IM SUSR: 0.5000 mL | Freq: Once | INTRAMUSCULAR | 1 refills | Status: AC

## 2018-10-15 NOTE — Progress Notes (Signed)
Subjective:   Tina Mercado is a 84 y.o. female who presents for Medicare Annual (Subsequent) preventive examination.  Problems Prior to Visit 1. Flu shot done 5 wks ago 2. Fell backwards when sweeping several wks ago. Chest and back were sore as well as coccyx. She did have an an appointment with Dr. Lenna Gilford who let her know it didn't feel displaced.  No pain w/ BM. Occ boathering her on certain seats.  The back and chest pain has been gone for several wks now Taking stool softener every night.  3.  No sense of balance - has told hold onto something when walking - feels more out of balance on certain days and she is not sure why.  That has been for 6 mos she has noticed.  Hearing limited but not worsening. No ringing in the ears. No presyncope, no vertigo.  Does have stepper for exercise which she can hold onto the handles with. Has a cane which she uses sometimes - never steps onto or off of curb, does not use steps without bilateral railing - she goes very slow. Can is single point.  Walking hunched over - would like a rollator and a 4 point cane 4.  Is noticing that it takes her a few minutes to think of things - will be thinking of something and it will totally slip her mind. Feels like it is like a tv flipping channels - always thinking of lots of things  5. Has developed little tremors - intention - not present at rest, can very by the day. Is in family - mother and both sisters so familiar.  occ a fine tremor when picking up something with shaking.  Saw Dr. Rexene Alberts 6.  Amitriptyline working very well - taking the large makjority of neuroathy sxs away.  Does make her sleepy several hours after she takes it. Takes it after eating dinner about 7 and then goes to bed about 12.  Does notice it is sometimes harder to get out of bed in the mornings. Review of Systems:  above       Objective:     Vitals: BP 124/88   Pulse 92   Temp 98 F (36.7 C) (Oral)   Resp 16   Ht 5\' 2"  (1.575 m)    Wt 129 lb (58.5 kg)   SpO2 97%   BMI 23.59 kg/m   Body mass index is 23.59 kg/m.  Advanced Directives 10/15/2018 04/01/2016  Does Patient Have a Medical Advance Directive? Yes No  Type of Advance Directive Living will;Healthcare Power of Attorney -  Does patient want to make changes to medical advance directive? No - Patient declined -  Copy of [place] in Chart? No - copy requested -  Would patient like information on creating a medical advance directive? - No - patient declined information    Tobacco Social History   Tobacco Use  Smoking Status Never Smoker  Smokeless Tobacco Never Used     Counseling given: Not Answered   Clinical Intake: Past Medical History:  Diagnosis Date  . Allergy   . Arrhythmia   . Cataract   . COPD (chronic obstructive pulmonary disease) ([place])   . GERD (gastroesophageal reflux disease)   . Glaucoma   . Heart murmur   . History of anemia   . History of diastolic dysfunction   . History of seasonal allergies   . Hyperlipidemia   . Hypertension   . Large hiatal hernia  see on thoracic spine xray  . Mitral regurgitation    mild to moderate  . Osteoporosis   . Reflux    Past Surgical History:  Procedure Laterality Date  . APPENDECTOMY    . BREAST SURGERY    . childbirth     x 1  . CORNEAL TRANSPLANT  july 2007  . EYE SURGERY    . TONSILLECTOMY AND ADENOIDECTOMY     age 65yrs   Family History  Problem Relation Age of Onset  . Heart disease Mother   . Emphysema Father   . Emphysema Brother   . COPD Brother    Social History   Socioeconomic History  . Marital status: Single    Spouse name: Not on file  . Number of children: Not on file  . Years of education: Not on file  . Highest education level: Not on file  Occupational History  . Not on file  Social Needs  . Financial resource strain: Not on file  . Food insecurity:    Worry: Not on file    Inability: Not on file  . Transportation needs:     Medical: Not on file    Non-medical: Not on file  Tobacco Use  . Smoking status: Never Smoker  . Smokeless tobacco: Never Used  Substance and Sexual Activity  . Alcohol use: No    Alcohol/week: 0.0 standard drinks  . Drug use: No  . Sexual activity: Never    Birth control/protection: Abstinence  Lifestyle  . Physical activity:    Days per week: Not on file    Minutes per session: Not on file  . Stress: Not on file  Relationships  . Social connections:    Talks on phone: Not on file    Gets together: Not on file    Attends religious service: Not on file    Active member of club or organization: Not on file    Attends meetings of clubs or organizations: Not on file    Relationship status: Not on file  Other Topics Concern  . Not on file  Social History Narrative  . Not on file    Outpatient Encounter Medications as of 10/15/2018  Medication Sig  . acetaminophen (TYLENOL) 500 MG tablet Take 500 mg by mouth every 6 (six) hours as needed.  Marland Kitchen ADVAIR HFA 115-21 MCG/ACT inhaler TAKE 2 PUFFS BY MOUTH TWICE A DAY  . amitriptyline (ELAVIL) 10 MG tablet TAKE 1 TABLET BY MOUTH EVERYDAY AT BEDTIME  . aspirin EC 81 MG tablet Take 81 mg by mouth daily.    . bromfenac (XIBROM) 0.09 % ophthalmic solution Place 1 drop into the right eye daily.   . cholecalciferol (VITAMIN D) 1000 UNITS tablet Take 2,000 Units by mouth daily.   . diclofenac (VOLTAREN) 75 MG EC tablet TAKE 1 TABLET (75 MG TOTAL) BY MOUTH EVERY MORNING BEFORE A BUSY DAY AS NEEDED FOR MILD PAIN  . Docusate Calcium (STOOL SOFTENER PO) Take 1 capsule by mouth daily as needed (for mild constipation).   Marland Kitchen esomeprazole (NEXIUM) 40 MG capsule TAKE ONE CAPSULE BY MOUTH EVERY DAY BEFORE SUPPER  . fluocinonide cream (LIDEX) 3.71 % Apply 1 application topically 2 (two) times daily. As needed  . hydrochlorothiazide (HYDRODIURIL) 12.5 MG tablet TAKE 1-2 TABLETS (12.5-25 MG TOTAL) BY MOUTH DAILY AS NEEDED (ELEVATED BLOOD PRESSURES).  Marland Kitchen  ibuprofen (ADVIL,MOTRIN) 200 MG tablet Take 200 mg by mouth every 6 (six) hours as needed for headache or mild pain (for  arthritis pain).   . meclizine (ANTIVERT) 25 MG tablet Take 1 tablet (25 mg total) by mouth 3 (three) times daily as needed for dizziness.  . rosuvastatin (CRESTOR) 10 MG tablet TAKE 1 TABLET BY MOUTH EVERY DAY  . timolol (BETIMOL) 0.5 % ophthalmic solution Place 1 drop into both eyes 2 (two) times daily.  . traMADol (ULTRAM) 50 MG tablet Take 1 tablet (50 mg total) by mouth 3 (three) times daily as needed for moderate pain. Take with Tylenol  . [DISCONTINUED] amoxicillin (AMOXIL) 500 MG capsule Take 4 capsules by mouth one hour prior to dental procedure   No facility-administered encounter medications on file as of 10/15/2018.     Activities of Daily Living In your present state of health, do you have any difficulty performing the following activities: 10/15/2018  Hearing? N  Vision? Y  Difficulty concentrating or making decisions? N  Walking or climbing stairs? N  Dressing or bathing? N  Doing errands, shopping? N  Some recent data might be hidden  DOES HAVE [place]  Patient Care Team: Shawnee Knapp, MD as PCP - General (Family Medicine) Noralee Space, MD as Consulting Physician (Pulmonary Disease) Stanford Breed Denice Bors, MD as Consulting Physician (Cardiology) Gerarda Fraction, MD as Referring Physician (Ophthalmology) Marilynne Halsted, MD as Referring Physician (Ophthalmology)    Assessment:   This is a routine wellness examination for [place].  Physical Exam  Constitutional: She is oriented to person, place, and time. She appears well-developed and well-nourished. No distress.  HENT:  Head: Normocephalic and atraumatic.  Right Ear: Tympanic membrane, external ear and ear canal normal.  Left Ear: Tympanic membrane, external ear and ear canal normal.  Nose: Nose normal. No mucosal edema or rhinorrhea.  Mouth/Throat: Uvula is midline,  oropharynx is clear and moist and mucous membranes are normal. No posterior oropharyngeal erythema.  Eyes: Pupils are equal, round, and reactive to light. Conjunctivae and EOM are normal. Right eye exhibits no discharge. Left eye exhibits no discharge. No scleral icterus.  Neck: Normal range of motion. Neck supple. No thyromegaly present.  Cardiovascular: Normal rate, regular rhythm, normal heart sounds and intact distal pulses.  Pulmonary/Chest: Effort normal and breath sounds normal. No respiratory distress.  Abdominal: Soft. Bowel sounds are normal. There is no tenderness.  Musculoskeletal: She exhibits no edema.  Lymphadenopathy:    She has no cervical adenopathy.  Neurological: She is alert and oriented to person, place, and time. She has normal reflexes.  Skin: Skin is warm and dry. She is not diaphoretic. No erythema.  Psychiatric: She has a normal mood and affect. Her behavior is normal.   Exercise Activities and Dietary recommendations    Goals   None     Fall Risk Fall Risk  10/15/2018 11/12/2017 10/26/2017 09/28/2017 09/15/2017  Falls in the past year? 1 Yes Yes No Yes  Number falls in past yr: 1 1 1  - 1  Injury with Fall? 1 Yes Yes - Yes  Comment - injury to right ribs - - LEFT FLANK   Is the patient's home free of loose throw rugs in walkways, pet beds, electrical cords, etc?   no      Grab bars in the bathroom? yes      Handrails on the stairs?   yes      Adequate lighting?   yes  Timed Get Up and Go performed: normal  Depression Screen PHQ 2/9 Scores 10/15/2018 11/12/2017 10/26/2017 09/28/2017  PHQ - 2 Score 1  0 0 0   Soometimes limited by back pain so then can't help do what she said it would help people with so sometimes feels useless. Back doesn't always hurt and sometimes all day. Ttried tramadol with 2 tylenol and doesn't add any benefit about tylenol alone.  Has more pain if she is lifting/carring   Cognitive Function     6CIT Screen 10/15/2018  What Year?  0 points  What month? 0 points  What time? 0 points  Count back from 20 0 points  Months in reverse 0 points  Repeat phrase 0 points  Total Score 0    Immunization History  Administered Date(s) Administered  . Influenza Split 07/27/2014  . Influenza, High Dose Seasonal PF 08/30/2014, 10/10/2015, 08/13/2016, 07/19/2017, 10/05/2018  . Influenza,inj,Quad PF,6+ Mos 08/28/2015  . Pneumococcal Conjugate-13 07/06/2014  . Pneumococcal Polysaccharide-23 09/28/2017  . Tdap 04/01/2016    Qualifies for Shingles Vaccine?yes  Screening Tests Health Maintenance  Topic Date Due  . TETANUS/TDAP  04/01/2026  . INFLUENZA VACCINE  Completed  . DEXA SCAN  Completed  . PNA vac Low Risk Adult  Completed    Cancer Screenings: Lung: Low Dose CT Chest recommended if Age 14-80 years, 30 pack-year currently smoking OR have quit w/in 15years. Patient does not qualify. Breast:  Up to date on Mammogram? Yes   Up to date of Bone Density/Dexa? Yes Colorectal: aged out  Additional Screenings: : Hepatitis C Screening:      Plan:     1. At high risk for falls   2. Fall at home, sequela   3. At high risk for injury related to fall   4. Imbalance   5. Age-related osteoporosis without current pathological fracture   6. Hearing loss, unspecified hearing loss type, unspecified laterality   7. Neuropathy   8. Prediabetes   9. Aortic atherosclerosis ([place])   10. Tremor, hereditary, benign   11. Chronic bilateral back pain, unspecified back location       I have personally reviewed and noted the following in the patient's chart:   . Medical and social history . Use of alcohol, tobacco or illicit drugs  . Current medications and supplements . Functional ability and status . Nutritional status . Physical activity . Advanced directives . List of other physicians . Hospitalizations, surgeries, and ER visits in previous 12 months . Vitals . Screenings to include cognitive, depression, and  falls . Referrals and appointments  In addition, I have reviewed and discussed with patient certain preventive protocols, quality metrics, and best practice recommendations. A written personalized care plan for preventive services as well as general preventive health recommendations were provided to patient.   Orders Placed This Encounter  Procedures  . CBC with Differential/Platelet  . Comprehensive metabolic panel    Order Specific Question:   Has the patient fasted?    Answer:   Yes  . TSH  . Hemoglobin A1c  . Lipid panel    Order Specific Question:   Has the patient fasted?    Answer:   Yes  . Ambulatory referral to Physical Therapy    Referral Priority:   Routine    Referral Type:   Physical Medicine    Referral Reason:   Specialty Services Required    Requested Specialty:   Physical Therapy    Number of Visits Requested:   1  . Ambulatory referral to Orthopedic Surgery    Referral Priority:   Routine    Referral Type:   Surgical  Referral Reason:   Specialty Services Required    Requested Specialty:   Orthopedic Surgery    Number of Visits Requested:   1  . Ambulatory referral to Audiology    Referral Priority:   Routine    Referral Type:   Audiology Exam    Referral Reason:   Specialty Services Required    Number of Visits Requested:   1  . POCT urinalysis dipstick    Meds ordered this encounter  Medications  . Zoster Vaccine Adjuvanted [facility]) injection    Sig: Inject 0.5 mLs into the muscle once for 1 dose. Repeat once in 2 to 6 months    Dispense:  1 each    Refill:  1  . amitriptyline (ELAVIL) 10 MG tablet    Sig: TAKE 1 TABLET BY MOUTH EVERYDAY AT BEDTIME    Dispense:  90 tablet    Refill:  1  . hydrochlorothiazide (HYDRODIURIL) 12.5 MG tablet    Sig: Take 1-2 tablets (12.5-25 mg total) by mouth daily as needed (elevated blood pressures).    Dispense:  30 tablet    Refill:  0    Delman Cheadle, MD, MPH [facility] [address] [place], [place]  [phone] Office phone  [phone] Office fax   10/28/18 8:04 AM   Shawnee Knapp, MD  10/15/2018

## 2018-10-15 NOTE — Progress Notes (Deleted)
Subjective:    Tina Mercado is a 84 y.o. female who presents for Medicare Annual/Subsequent preventive examination.  Preventive Screening-Counseling & Management  Tobacco Social History   Tobacco Use  Smoking Status Never Smoker  Smokeless Tobacco Never Used     Problems Prior to Visit 1. Flu shot done 5 wks ago 2. Fell backwards when sweeping several wks ago. Chest and back were sore as well as coccyx. She did have an an appointment with Dr. Lenna Gilford who let her know it didn't feel displaced.  No pain w/ BM. Occ boathering her on certain seats.  The back and chest pain has been gone for several wks now Taking stool softener every night.  3.  No sense of balance - has told hold onto something when walking - feels more out of balance on certain days and she is not sure why.  That has been for 6 mos she has noticed.  Hearing limited but not worsening. No ringing in the ears. No presyncope, no vertigo.  Does have stepper for exercise which she can hold onto the handles with. Has a cane which she uses sometimes - never steps onto or off of curb, does not use steps without bilateral railing - she goes very slow. Can is single point.  Walking hunched over - would like a rollator and a 4 point cane 4.  Is noticing that it takes her a few minutes.  Current Problems (verified) Patient Active Problem List   Diagnosis Date Noted  . Prediabetes 09/28/2017  . Dizziness 09/15/2017  . Neuropathy 07/16/2017  . COPD mixed type ([place]) 04/10/2015  . Laryngopharyngeal reflux (LPR) 04/10/2015  . Chronic laryngitis 08/17/2014  . Blindness and low vision 04/13/2013  . Osteoporosis 12/23/2012  . Bronchitis 10/30/2011  . Benign hypertensive heart disease without heart failure 08/05/2011  . Pure hypercholesterolemia 08/05/2011  . Mitral regurgitation   . History of anemia   . History of diastolic dysfunction   . GERD (gastroesophageal reflux disease)   . History of seasonal allergies   . Large  hiatal hernia 11/23/2004    Medications Prior to Visit Current Outpatient Medications on File Prior to Visit  Medication Sig Dispense Refill  . acetaminophen (TYLENOL) 500 MG tablet Take 500 mg by mouth every 6 (six) hours as needed.    Marland Kitchen ADVAIR HFA 115-21 MCG/ACT inhaler TAKE 2 PUFFS BY MOUTH TWICE A DAY 12 Inhaler 11  . amitriptyline (ELAVIL) 10 MG tablet TAKE 1 TABLET BY MOUTH EVERYDAY AT BEDTIME 90 tablet 0  . aspirin EC 81 MG tablet Take 81 mg by mouth daily.      . bromfenac (XIBROM) 0.09 % ophthalmic solution Place 1 drop into the right eye daily.     . cholecalciferol (VITAMIN D) 1000 UNITS tablet Take 2,000 Units by mouth daily.     . diclofenac (VOLTAREN) 75 MG EC tablet TAKE 1 TABLET (75 MG TOTAL) BY MOUTH EVERY MORNING BEFORE A BUSY DAY AS NEEDED FOR MILD PAIN 30 tablet 1  . Docusate Calcium (STOOL SOFTENER PO) Take 1 capsule by mouth daily as needed (for mild constipation).     Marland Kitchen esomeprazole (NEXIUM) 40 MG capsule TAKE ONE CAPSULE BY MOUTH EVERY DAY BEFORE SUPPER 30 capsule 5  . fluocinonide cream (LIDEX) 0.17 % Apply 1 application topically 2 (two) times daily. As needed    . hydrochlorothiazide (HYDRODIURIL) 12.5 MG tablet TAKE 1-2 TABLETS (12.5-25 MG TOTAL) BY MOUTH DAILY AS NEEDED (ELEVATED BLOOD PRESSURES). 30 tablet 0  .  ibuprofen (ADVIL,MOTRIN) 200 MG tablet Take 200 mg by mouth every 6 (six) hours as needed for headache or mild pain (for arthritis pain).     . meclizine (ANTIVERT) 25 MG tablet Take 1 tablet (25 mg total) by mouth 3 (three) times daily as needed for dizziness. 30 tablet 0  . rosuvastatin (CRESTOR) 10 MG tablet TAKE 1 TABLET BY MOUTH EVERY DAY 30 tablet 1  . timolol (BETIMOL) 0.5 % ophthalmic solution Place 1 drop into both eyes 2 (two) times daily.    . traMADol (ULTRAM) 50 MG tablet Take 1 tablet (50 mg total) by mouth 3 (three) times daily as needed for moderate pain. Take with Tylenol 90 tablet 2   No current facility-administered medications on file  prior to visit.     Current Medications (verified) Current Outpatient Medications  Medication Sig Dispense Refill  . acetaminophen (TYLENOL) 500 MG tablet Take 500 mg by mouth every 6 (six) hours as needed.    Marland Kitchen ADVAIR HFA 115-21 MCG/ACT inhaler TAKE 2 PUFFS BY MOUTH TWICE A DAY 12 Inhaler 11  . amitriptyline (ELAVIL) 10 MG tablet TAKE 1 TABLET BY MOUTH EVERYDAY AT BEDTIME 90 tablet 0  . aspirin EC 81 MG tablet Take 81 mg by mouth daily.      . bromfenac (XIBROM) 0.09 % ophthalmic solution Place 1 drop into the right eye daily.     . cholecalciferol (VITAMIN D) 1000 UNITS tablet Take 2,000 Units by mouth daily.     . diclofenac (VOLTAREN) 75 MG EC tablet TAKE 1 TABLET (75 MG TOTAL) BY MOUTH EVERY MORNING BEFORE A BUSY DAY AS NEEDED FOR MILD PAIN 30 tablet 1  . Docusate Calcium (STOOL SOFTENER PO) Take 1 capsule by mouth daily as needed (for mild constipation).     Marland Kitchen esomeprazole (NEXIUM) 40 MG capsule TAKE ONE CAPSULE BY MOUTH EVERY DAY BEFORE SUPPER 30 capsule 5  . fluocinonide cream (LIDEX) 2.02 % Apply 1 application topically 2 (two) times daily. As needed    . hydrochlorothiazide (HYDRODIURIL) 12.5 MG tablet TAKE 1-2 TABLETS (12.5-25 MG TOTAL) BY MOUTH DAILY AS NEEDED (ELEVATED BLOOD PRESSURES). 30 tablet 0  . ibuprofen (ADVIL,MOTRIN) 200 MG tablet Take 200 mg by mouth every 6 (six) hours as needed for headache or mild pain (for arthritis pain).     . meclizine (ANTIVERT) 25 MG tablet Take 1 tablet (25 mg total) by mouth 3 (three) times daily as needed for dizziness. 30 tablet 0  . rosuvastatin (CRESTOR) 10 MG tablet TAKE 1 TABLET BY MOUTH EVERY DAY 30 tablet 1  . timolol (BETIMOL) 0.5 % ophthalmic solution Place 1 drop into both eyes 2 (two) times daily.    . traMADol (ULTRAM) 50 MG tablet Take 1 tablet (50 mg total) by mouth 3 (three) times daily as needed for moderate pain. Take with Tylenol 90 tablet 2   No current facility-administered medications for this visit.      Allergies  (verified) Contrast media [iodinated diagnostic agents]; Antihistamines, diphenhydramine-type; Celebrex [celecoxib]; Darvocet [propoxyphene n-acetaminophen]; Demerol; Diphenhydramine hcl; Feldene [piroxicam]; Gabapentin; Meperidine; Propoxyphene; and Iodine-131   PAST HISTORY  Family History Family History  Problem Relation Age of Onset  . Heart disease Mother   . Emphysema Father   . Emphysema Brother   . COPD Brother     Social History Social History   Tobacco Use  . Smoking status: Never Smoker  . Smokeless tobacco: Never Used  Substance Use Topics  . Alcohol use: No  Alcohol/week: 0.0 standard drinks     Are there smokers in your home (other than you)? {yes/no:20286}  Risk Factors Current exercise habits: {exercise:19826}  Dietary issues discussed: ***   Cardiac risk factors: {risk factors:510}.  Depression Screen (Note: if answer to either of the following is "Yes", a more complete depression screening is indicated)   Over the past two weeks, have you felt down, depressed or hopeless? {yes/no:20286}  Over the past two weeks, have you felt little interest or pleasure in doing things? {yes/no:20286}  Have you lost interest or pleasure in daily life? {yes/no:20286}  Do you often feel hopeless? {yes/no:20286}  Do you cry easily over simple problems? {yes/no:20286}  Activities of Daily Living In your present state of health, do you have any difficulty performing the following activities?:  Driving? {yes/no:20286} Managing money?  {Responses; yes/no (default no):140031::"No"} Feeding yourself? {yes/no:20286} Getting from bed to chair? {yes/no:20286}{exam, Complete:18323} Climbing a flight of stairs? {yes/no:20286} Preparing food and eating?: {yes/no (default no):140031::"No"} Bathing or showering? {Responses; yes/no (default no):140031::"No"} Getting dressed: {yes/no (default no):140031::"No"} Getting to the toilet? {Responses; yes/no (default  no):140031::"No"} Using the toilet:{yes/no (default no):140031::"No"} Moving around from place to place: {yes/no (default no):140031::"No"} In the past year have you fallen or had a near fall?:{yes/no (default no):140031::"No"}   Are you sexually active?  {yes/no:20286}  Do you have more than one partner?  {Responses; yes/no (default no):140031::"No"}  Hearing Difficulties: {yes/no (default no):140031::"No"} Do you often ask people to speak up or repeat themselves? {Responses; yes/no (default no):140031::"No"} Do you experience ringing or noises in your ears? {Responses; yes/no (default no):140031::"No"} Do you have difficulty understanding soft or whispered voices? {Responses; yes/no (default no):140031::"No"}   Do you feel that you have a problem with memory? {yes/no:20286}  Do you often misplace items? {yes/no:20286}  Do you feel safe at home?  {yes/no:20286}  Cognitive Testing  Alert? {yes/no:20286}  Normal Appearance?{yes/no:20286}  Oriented to person? {yes/no:20286}  Place? {yes/no:20286}   Time? {yes/no:20286}  Recall of three objects?  {yes/no:20286}  Can perform simple calculations? {yes/no:20286}  Displays appropriate judgment?{yes/no:20286}  Can read the correct time from a watch face?{yes/no:20286}   Advanced Directives have been discussed with the patient? {yes/no:20286}  List the Names of Other Physician/Practitioners you currently use: 1.    Indicate any recent Medical Services you may have received from other than [facility] providers in the past year (date may be approximate).  Immunization History  Administered Date(s) Administered  . Influenza Split 07/27/2014  . Influenza, High Dose Seasonal PF 08/30/2014, 10/10/2015, 08/13/2016, 07/19/2017, 10/05/2018  . Influenza,inj,Quad PF,6+ Mos 08/28/2015  . Pneumococcal Conjugate-13 07/06/2014  . Pneumococcal Polysaccharide-23 09/28/2017  . Tdap 04/01/2016    Screening Tests Health Maintenance  Topic Date Due  .  TETANUS/TDAP  04/01/2026  . INFLUENZA VACCINE  Completed  . DEXA SCAN  Completed  . PNA vac Low Risk Adult  Completed    All answers were reviewed with the patient and necessary referrals were made:  Shawnee Knapp, MD   10/15/2018   History reviewed: {history reviewed:20406::"allergies","current medications","past family history","past medical history","past social history","past surgical history","problem list"}  Review of Systems {ros; complete:30496}    Objective:     Vision by Snellen chart: right eye:{vision:19455::"20/20"}, left eye:{vision:19455::"20/20"}  Body mass index is 23.59 kg/m. BP 124/88   Pulse 92   Temp 98 F (36.7 C) (Oral)   Resp 16   Ht 5\' 2"  (1.575 m)   Wt 129 lb (58.5 kg)   SpO2 97%   BMI 23.59  kg/m   {Exam, female:18323}     Assessment:     ***     Plan:     During the course of the visit the patient was educated and counseled about appropriate screening and preventive services including:    {plan:19836}  Diet review for nutrition referral? Yes ____  Not Indicated ____   Patient Instructions (the written plan) was given to the patient.  Medicare Attestation I have personally reviewed: The patient's medical and social history Their use of alcohol, tobacco or illicit drugs Their current medications and supplements The patient's functional ability including ADLs,fall risks, home safety risks, cognitive, and hearing and visual impairment Diet and physical activities Evidence for depression or mood disorders  The patient's weight, height, BMI, and visual acuity have been recorded in the chart.  I have made referrals, counseling, and provided education to the patient based on review of the above and I have provided the patient with a written personalized care plan for preventive services.     Shawnee Knapp, MD   10/15/2018

## 2018-10-15 NOTE — Patient Instructions (Addendum)
If you have lab work done today you will be contacted with your lab results within the next 2 weeks.  If you have not heard from [place] then please contact us. The fastest way to get your results is to register for My Chart.   IF you received an x-ray today, you will receive an invoice from [facility]. Please contact [facility] at [phone] with questions or concerns regarding your invoice.   IF you received labwork today, you will receive an invoice from [facility]. Please contact [facility] at [phone] with questions or concerns regarding your invoice.   Our billing staff will not be able to assist you with questions regarding bills from these companies.  You will be contacted with the lab results as soon as they are available. The fastest way to get your results is to activate your My Chart account. Instructions are located on the last page of this paperwork. If you have not heard from [place] regarding the results in 2 weeks, please contact this office.      How to Use a Kasandra Knudsen  Canes are used to help with walking. Using a cane makes you more stable, reduces pain, and eases strain on certain muscle groups. There are various kinds of canes. Most have either a single point, four points (quad cane), or three points at the bottom. The best kind of cane for you depends on what you need it for. People with arthritis generally do well with a single-point cane. People who have certain neurological conditions, such as people who have had a stroke, may do better with a quad cane because it allows them to put more weight on it (support more of their body weight). How to choose a cane that fits It is important to use a cane that fits properly. A cane fits properly if the top of the cane comes to your wrist joint when you are standing upright with your arm relaxed at your side. How to use your cane Hold your cane in the hand opposite the injured or weaker side. Always move the cane and  the foot of the weaker side with each other (in unison). Walking  Put as much weight on the cane as necessary to make walking comfortable, stable, and smooth.  Stand tall with good posture and look ahead, not down at your feet.  Hold the cane about 2 inches (5 cm) in front or to the side of you.  Each time you take a step with your injured leg, move the cane at the same time to help balance you. Going up steps  Step first with your stronger foot.  Move the cane and the weaker foot up the step at the same time.  Always use the railing with your free hand. Going down steps  Step down first with the cane and your weaker foot.  Then follow with your stronger foot.  Always use the railing with your free hand. Safety tips for home Take these steps to make your home safer when you are walking with a cane:  Be familiar with your home environment.  Have sturdy handrails in your bathrooms and hallways.  Wear nonslip, comfortable, well-fitting footwear.  Use night-lights in the dark.  Keep floor surfaces clean and dry.  Keep high-traffic areas uncluttered.  Remove any rugs, cords, or loose objects from the floor. Contact a health care provider if:  You still feel unsteady on your feet while using the cane.  You develop new  pain, such as pain in your back, shoulder, wrist, or hip.  You develop any numbness or tingling. Get help right away if:  You fall. Summary  Using a cane makes you more stable, reduces pain, and eases strain on certain muscle groups.  A cane fits properly if the top of the cane comes to your wrist joint when you are standing upright with your arm relaxed at your side.  The best kind of cane for you depends on what you need it for.  Hold your cane in the hand opposite the injured or weaker side.  Always move the cane and the foot of the weaker side with each other (in unison). This information is not intended to replace advice given to you by your  health care provider. Make sure you discuss any questions you have with your health care provider. Document Released: 10/13/2005 Document Revised: 11/15/2016 Document Reviewed: 11/15/2016 Elsevier Interactive Patient Education  2019 [employer].  How To Use a Four-Wheeled Walker  This sheet gives you information about how to use your four-wheeled walker. Your health care provider may also give you more specific instructions. A four-wheeled walker has wheels on the ends of the front and back legs. Do not use your four-wheeled walker on stairs or an escalator unless you have been trained by a physical therapist or unless your health care provider approves. Follow any instructions or limits from your health care provider about using your legs or arms to support your body weight (weight bearing). How to stand up with a four-wheeled walker 1. Place the walker in front of your legs where you are seated. Be careful not to roll the walker forward until you are ready to walk. 2. Place the brakes in the locked position. 3. Scoot your buttocks forward in your chair. 4. Position your legs so that: ? Your weaker leg is ahead of you. ? Your stronger leg is bent and near your chair. 5. If your chair has armrests, put each hand on an armrest. If there are no armrests, put the hand from the side of your stronger leg on the chair seat. Next, put the other hand on the center of the walker's crossbar. 6. Lean forward and raise your buttocks off of the chair. This will put you in a stooped position. 7. Rise to a stand by straightening your stronger leg. 8. Steady yourself. 9. Carefully move your hands to the handgrips of the walker. 10. Unlock the brakes. 11. Slowly roll the walker forward and begin walking. How to sit down with a four-wheeled walker To sit down in a seat that has armrests: 1. Back up slowly toward your seat, using your walker, until you feel the chair touching the back of your legs. 2. Place  the brakes in the locked position. 3. With one hand at a time, carefully reach behind you and put each hand on an armrest. 4. Slowly lower yourself into the seat. To sit down in a seat without armrests: 1. Back up slowly toward the side of the seat, using your walker, until you feel the chair touching the back of your legs. 2. Place the brakes in the locked position. 3. Use one hand to hold onto the back of the chair, and use the other hand to hold onto the front of the seat. 4. Slowly lower yourself into the seat. How to walk with a four-wheeled walker 1. Slide your four-wheeled walker one step-length in front of you. Your toes should be farther  forward than the back wheels of your walker. 2. Hold onto the walker for support. Lean your weight on the walker and step with your weaker leg into the middle of the walker (between the left and right wheels). 3. Step your stronger leg forward to land next to your weaker leg. 4. Repeat this process for each step that you take. How to use a four-wheeled walker on a curb or a step To use a four-wheeled walker to step up: 1. Put all four wheels of the walker on the curb or step that is higher than where you are standing. 2. Get your feet as close to the curb or step as you can. 3. Test the steadiness of the walker by pressing down on the handgrips. Be careful not to roll the walker forward until both of your feet are on the curb or step. 4. If the walker is steady, press down on it with your hands as you step up with your stronger leg. 5. Step up with your weaker leg. To use a four-wheeled walker to step down: 1. Put all four wheels of the walker on the surface that is lower than the curb or step. 2. Get your feet as close to the curb or step as you can. 3. Test the steadiness of the walker by pressing down on the handgrips. Be careful not to roll the walker forward until both of your feet are on the surface that is lower than the curb or step. 4. If  the walker is steady, press down on it with your hands as you step down with your weaker leg. 5. Step down with your stronger leg. General tips  When using your walker: ? You should not feel like you need to lean forward or to the side to keep your hands on the handgrips. If this is the case, contact your health care provider or physical therapist. ? Always keep both feet within the width of the walker's legs or wheels. ? Do not let the walker get too far ahead of you as you walk. Make sure that your toes are always farther forward than the back legs of the walker.  Do not pull on the walker or glide it forward while you stand up.  Sit in a firm chair whenever you can. A low seat or an overstuffed chair or sofa is hard to get out of.  Regularly check the wheels of your walker to make sure that they are in good condition.  Make sure that the brakes are working properly. Summary  A four-wheeled walker has wheels on the ends of the front and back legs.  Do not use your four-wheeled walker on stairs or an escalator unless you have been trained by a physical therapist or unless your health care provider approves.  Follow any instructions or limits from your health care provider about using your legs or arms to support your body weight (weight bearing). This information is not intended to replace advice given to you by your health care provider. Make sure you discuss any questions you have with your health care provider. Document Released: 03/20/2017 Document Revised: 06/17/2017 Document Reviewed: 03/20/2017 Elsevier Interactive Patient Education  2019 [employer].

## 2018-10-16 LAB — CBC WITH DIFFERENTIAL/PLATELET
Basophils Absolute: 0.1 10*3/uL (ref 0.0–0.2)
Basos: 1 %
EOS (ABSOLUTE): 0.2 10*3/uL (ref 0.0–0.4)
Eos: 3 %
Hematocrit: 41.3 % (ref 34.0–46.6)
Hemoglobin: 13.7 g/dL (ref 11.1–15.9)
IMMATURE GRANULOCYTES: 1 %
Immature Grans (Abs): 0.1 10*3/uL (ref 0.0–0.1)
Lymphocytes Absolute: 1.2 10*3/uL (ref 0.7–3.1)
Lymphs: 17 %
MCH: 29.7 pg (ref 26.6–33.0)
MCHC: 33.2 g/dL (ref 31.5–35.7)
MCV: 90 fL (ref 79–97)
Monocytes Absolute: 0.5 10*3/uL (ref 0.1–0.9)
Monocytes: 7 %
Neutrophils Absolute: 5.1 10*3/uL (ref 1.4–7.0)
Neutrophils: 71 %
Platelets: 260 10*3/uL (ref 150–450)
RBC: 4.61 x10E6/uL (ref 3.77–5.28)
RDW: 14.4 % (ref 12.3–15.4)
WBC: 7.2 10*3/uL (ref 3.4–10.8)

## 2018-10-16 LAB — COMPREHENSIVE METABOLIC PANEL
ALT: 9 IU/L (ref 0–32)
AST: 15 IU/L (ref 0–40)
Albumin/Globulin Ratio: 1.8 (ref 1.2–2.2)
Albumin: 4.1 g/dL (ref 3.5–4.7)
Alkaline Phosphatase: 89 IU/L (ref 39–117)
BUN/Creatinine Ratio: 12 (ref 12–28)
BUN: 13 mg/dL (ref 8–27)
Bilirubin Total: 0.7 mg/dL (ref 0.0–1.2)
CO2: 23 mmol/L (ref 20–29)
Calcium: 9.9 mg/dL (ref 8.7–10.3)
Chloride: 106 mmol/L (ref 96–106)
Creatinine, Ser: 1.06 mg/dL — ABNORMAL HIGH (ref 0.57–1.00)
GFR calc non Af Amer: 47 mL/min/{1.73_m2} — ABNORMAL LOW (ref >59)
GFR, EST AFRICAN AMERICAN: 55 mL/min/{1.73_m2} — AB (ref >59)
Globulin, Total: 2.3 g/dL (ref 1.5–4.5)
Glucose: 104 mg/dL — ABNORMAL HIGH (ref 65–99)
Potassium: 5 mmol/L (ref 3.5–5.2)
Sodium: 143 mmol/L (ref 134–144)
TOTAL PROTEIN: 6.4 g/dL (ref 6.0–8.5)

## 2018-10-16 LAB — TSH: TSH: 3.98 u[IU]/mL (ref 0.450–4.500)

## 2018-10-16 LAB — HEMOGLOBIN A1C
Est. average glucose Bld gHb Est-mCnc: 123 mg/dL
Hgb A1c MFr Bld: 5.9 % — ABNORMAL HIGH (ref 4.8–5.6)

## 2018-10-16 LAB — LIPID PANEL
Chol/HDL Ratio: 3.4 ratio (ref 0.0–4.4)
Cholesterol, Total: 182 mg/dL (ref 100–199)
HDL: 54 mg/dL (ref >39)
LDL Calculated: 97 mg/dL (ref 0–99)
Triglycerides: 155 mg/dL — ABNORMAL HIGH (ref 0–149)
VLDL Cholesterol Cal: 31 mg/dL (ref 5–40)

## 2018-10-23 ENCOUNTER — Encounter: Payer: Self-pay | Admitting: Family Medicine

## 2018-10-28 DIAGNOSIS — Z961 Presence of intraocular lens: Secondary | ICD-10-CM | POA: Diagnosis not present

## 2018-10-28 DIAGNOSIS — H1851 Endothelial corneal dystrophy: Secondary | ICD-10-CM | POA: Diagnosis not present

## 2018-10-28 DIAGNOSIS — H2512 Age-related nuclear cataract, left eye: Secondary | ICD-10-CM | POA: Diagnosis not present

## 2018-10-28 DIAGNOSIS — H353132 Nonexudative age-related macular degeneration, bilateral, intermediate dry stage: Secondary | ICD-10-CM | POA: Diagnosis not present

## 2018-11-01 ENCOUNTER — Emergency Department (HOSPITAL_COMMUNITY): Payer: Medicare Other

## 2018-11-01 ENCOUNTER — Encounter (HOSPITAL_COMMUNITY): Payer: Self-pay | Admitting: Emergency Medicine

## 2018-11-01 ENCOUNTER — Other Ambulatory Visit: Payer: Self-pay

## 2018-11-01 ENCOUNTER — Emergency Department (HOSPITAL_COMMUNITY)
Admission: EM | Admit: 2018-11-01 | Discharge: 2018-11-01 | Disposition: A | Discharge status: Home / Self Care | Payer: Medicare Other | Attending: Emergency Medicine | Admitting: Emergency Medicine

## 2018-11-01 DIAGNOSIS — M25512 Pain in left shoulder: Secondary | ICD-10-CM

## 2018-11-01 DIAGNOSIS — R51 Headache: Secondary | ICD-10-CM | POA: Diagnosis not present

## 2018-11-01 DIAGNOSIS — S6992XA Unspecified injury of left wrist, hand and finger(s), initial encounter: Secondary | ICD-10-CM | POA: Diagnosis not present

## 2018-11-01 DIAGNOSIS — W19XXXA Unspecified fall, initial encounter: Secondary | ICD-10-CM

## 2018-11-01 DIAGNOSIS — W0110XA Fall on same level from slipping, tripping and stumbling with subsequent striking against unspecified object, initial encounter: Secondary | ICD-10-CM | POA: Diagnosis not present

## 2018-11-01 DIAGNOSIS — J449 Chronic obstructive pulmonary disease, unspecified: Secondary | ICD-10-CM | POA: Insufficient documentation

## 2018-11-01 DIAGNOSIS — Z79899 Other long term (current) drug therapy: Secondary | ICD-10-CM | POA: Insufficient documentation

## 2018-11-01 DIAGNOSIS — S4992XA Unspecified injury of left shoulder and upper arm, initial encounter: Secondary | ICD-10-CM | POA: Diagnosis not present

## 2018-11-01 DIAGNOSIS — R52 Pain, unspecified: Secondary | ICD-10-CM | POA: Diagnosis not present

## 2018-11-01 DIAGNOSIS — I10 Essential (primary) hypertension: Secondary | ICD-10-CM | POA: Diagnosis not present

## 2018-11-01 DIAGNOSIS — M25532 Pain in left wrist: Secondary | ICD-10-CM | POA: Diagnosis not present

## 2018-11-01 DIAGNOSIS — S79912A Unspecified injury of left hip, initial encounter: Secondary | ICD-10-CM | POA: Diagnosis not present

## 2018-11-01 DIAGNOSIS — Z7982 Long term (current) use of aspirin: Secondary | ICD-10-CM | POA: Insufficient documentation

## 2018-11-01 DIAGNOSIS — S0990XA Unspecified injury of head, initial encounter: Secondary | ICD-10-CM | POA: Diagnosis not present

## 2018-11-01 DIAGNOSIS — M25552 Pain in left hip: Secondary | ICD-10-CM

## 2018-11-01 DIAGNOSIS — S299XXA Unspecified injury of thorax, initial encounter: Secondary | ICD-10-CM | POA: Diagnosis not present

## 2018-11-01 MED ORDER — MORPHINE SULFATE (PF) 4 MG/ML IV SOLN
4.0000 mg | Freq: Once | INTRAVENOUS | Status: AC
  Administered 2018-11-01: 4 mg via INTRAVENOUS
  Filled 2018-11-01: qty 1

## 2018-11-01 MED ORDER — HYDROCODONE-ACETAMINOPHEN 5-325 MG PO TABS: 1.0000 | ORAL_TABLET | Freq: Four times a day (QID) | ORAL | 0 refills | Status: DC | PRN

## 2018-11-01 MED ORDER — ONDANSETRON HCL 4 MG/2ML IJ SOLN
4.0000 mg | Freq: Once | INTRAMUSCULAR | Status: AC
  Administered 2018-11-01: 4 mg via INTRAVENOUS
  Filled 2018-11-01: qty 2

## 2018-11-01 NOTE — ED Triage Notes (Signed)
Per EMS pt complaint of left shoulder pain post falling off curb with cane.

## 2018-11-01 NOTE — ED Notes (Signed)
Bed: WN75 Expected date:  Expected time:  Means of arrival:  Comments: EMS 89 fall

## 2018-11-01 NOTE — ED Notes (Signed)
Patient is complaining of lateral left hip. Patient is able to bear weight and perform some range of motion. Linsday, PA was at bedside. Patient was assisted back to the stretcher. X-ray of the hip will be performed. Thedore Mins, RN is aware of the delay for discharging.

## 2018-11-01 NOTE — ED Provider Notes (Signed)
[facility] DEPT Provider Note   CSN: [id number] Arrival date & time: 11/01/18  1633     History   Chief Complaint Chief Complaint  Patient presents with  . Fall  . Shoulder Pain    HPI Tina Mercado is a 85 y.o. female past medical history of COPD, GERD, anemia who presents for evaluation of left shoulder pain, left wrist pain after mechanical fall that occurred just prior to ED arrival.  Patient reports that at baseline, she has some difficulty with balance and normally has to ambulate with cane.  Patient reports she was walking in a parking lot and states that her cane got caught and she became off balance and fell.  She reports that she hit her left side of her head, left shoulder and left arm on concrete.  She denies any head injury or LOC.  She states she is currently not on any blood thinners.  She denies any preceding chest pain or dizziness.  Patient states she has had pain to the left shoulder and arm since the incident.  She reports difficulty moving the arm secondary to pain.  She has not had any medication for pain.  Patient denies any numbness/weakness.  The history is provided by the patient.    Past Medical History:  Diagnosis Date  . Allergy   . Arrhythmia   . Cataract   . COPD (chronic obstructive pulmonary disease) ([place])   . GERD (gastroesophageal reflux disease)   . Glaucoma   . Heart murmur   . History of anemia   . History of diastolic dysfunction   . History of seasonal allergies   . Hyperlipidemia   . Hypertension   . Large hiatal hernia    see on thoracic spine xray  . Mitral regurgitation    mild to moderate  . Osteoporosis   . Reflux     Patient Active Problem List   Diagnosis Date Noted  . Prediabetes 09/28/2017  . Dizziness 09/15/2017  . Neuropathy 07/16/2017  . COPD mixed type ([place]) 04/10/2015  . Laryngopharyngeal reflux (LPR) 04/10/2015  . Chronic laryngitis 08/17/2014  . Blindness and low vision  04/13/2013  . Osteoporosis 12/23/2012  . Bronchitis 10/30/2011  . Benign hypertensive heart disease without heart failure 08/05/2011  . Pure hypercholesterolemia 08/05/2011  . Mitral regurgitation   . History of anemia   . History of diastolic dysfunction   . GERD (gastroesophageal reflux disease)   . History of seasonal allergies   . Large hiatal hernia 11/23/2004    Past Surgical History:  Procedure Laterality Date  . APPENDECTOMY    . BREAST SURGERY    . childbirth     x 1  . CORNEAL TRANSPLANT  july 2007  . EYE SURGERY    . TONSILLECTOMY AND ADENOIDECTOMY     age 69yrs     OB History   No obstetric history on file.      Home Medications    Prior to Admission medications   Medication Sig Start Date End Date Taking? Authorizing Provider  acetaminophen (TYLENOL) 500 MG tablet Take 1,000 mg by mouth every 6 (six) hours as needed for mild pain.    Yes [provider]  ADVAIR HFA 115-21 MCG/ACT inhaler TAKE 2 PUFFS BY MOUTH TWICE A DAY Patient taking differently: Inhale 2 puffs into the lungs 2 (two) times daily.  09/29/18  Yes Noralee Space, MD  amitriptyline (ELAVIL) 10 MG tablet TAKE 1 TABLET BY MOUTH EVERYDAY  AT BEDTIME Patient taking differently: Take 10 mg by mouth at bedtime.  10/15/18  Yes Shawnee Knapp, MD  aspirin EC 81 MG tablet Take 81 mg by mouth at bedtime.    Yes [provider]  Cholecalciferol (VITAMIN D) 50 MCG (2000 UT) CAPS Take 2,000 Units by mouth at bedtime.   Yes [provider]  docusate sodium (COLACE) 100 MG capsule Take 100 mg by mouth at bedtime.   Yes [provider]  esomeprazole (NEXIUM) 40 MG capsule TAKE ONE CAPSULE BY MOUTH EVERY DAY BEFORE SUPPER Patient taking differently: Take 40 mg by mouth at bedtime.  08/10/18  Yes Noralee Space, MD  fluocinonide cream (LIDEX) 7.51 % Apply 1 application topically 2 (two) times daily as needed (rash).  01/29/16  Yes [provider]  hydrochlorothiazide  (HYDRODIURIL) 12.5 MG tablet Take 1-2 tablets (12.5-25 mg total) by mouth daily as needed (elevated blood pressures). 10/15/18  Yes Shawnee Knapp, MD  ketorolac (ACULAR) 0.5 % ophthalmic solution Place 1 drop into the right eye 2 (two) times daily.   Yes [provider]  rosuvastatin (CRESTOR) 10 MG tablet TAKE 1 TABLET BY MOUTH EVERY DAY Patient taking differently: Take 10 mg by mouth at bedtime.  09/13/18  Yes Lelon Perla, MD  timolol (BETIMOL) 0.5 % ophthalmic solution Place 1 drop into both eyes 2 (two) times daily.   Yes [provider]  traMADol (ULTRAM) 50 MG tablet Take 50 mg by mouth every 6 (six) hours as needed for moderate pain.   Yes [provider]  diclofenac (VOLTAREN) 75 MG EC tablet TAKE 1 TABLET (75 MG TOTAL) BY MOUTH EVERY MORNING BEFORE A BUSY DAY AS NEEDED FOR MILD PAIN Patient not taking: Reported on 11/01/2018 04/30/18   Shawnee Knapp, MD  HYDROcodone-acetaminophen (NORCO/VICODIN) 5-325 MG tablet Take 1 tablet by mouth every 6 (six) hours as needed. 11/01/18   Volanda Napoleon, PA-C    Family History Family History  Problem Relation Age of Onset  . Heart disease Mother   . Emphysema Father   . Emphysema Brother   . COPD Brother     Social History Social History   Tobacco Use  . Smoking status: Never Smoker  . Smokeless tobacco: Never Used  Substance Use Topics  . Alcohol use: No    Alcohol/week: 0.0 standard drinks  . Drug use: No     Allergies   Contrast media [iodinated diagnostic agents]; Antihistamines, diphenhydramine-type; Celebrex [celecoxib]; Darvocet [propoxyphene n-acetaminophen]; Demerol; Diphenhydramine hcl; Feldene [piroxicam]; Gabapentin; Meperidine; Propoxyphene; and Iodine-131   Review of Systems Review of Systems  Eyes: Negative for visual disturbance.  Respiratory: Negative for cough and shortness of breath.   Cardiovascular: Negative for chest pain.  Gastrointestinal: Negative for abdominal pain, nausea and  vomiting.  Genitourinary: Negative for dysuria and hematuria.  Musculoskeletal:       Left shoulder pain  Neurological: Positive for headaches. Negative for dizziness, weakness and numbness.  All other systems reviewed and are negative.    Physical Exam Updated Vital Signs BP (!) 173/60   Pulse 86   Temp (!) 96.4 F (35.8 C) (Axillary)   Resp 18   SpO2 95%   Physical Exam Vitals signs and nursing note reviewed.  Constitutional:      Appearance: Normal appearance. She is well-developed.  HENT:     Head: Normocephalic and atraumatic.     Comments: No tenderness to palpation of skull. No deformities or crepitus noted. No  open wounds, abrasions or lacerations.  Eyes:     General: Lids are normal.     Conjunctiva/sclera: Conjunctivae normal.     Pupils: Pupils are equal, round, and reactive to light.  Neck:     Musculoskeletal: Full passive range of motion without pain.     Comments: Full flexion/extension and lateral movement of neck fully intact. No bony midline tenderness. No deformities or crepitus.  Cardiovascular:     Rate and Rhythm: Normal rate and regular rhythm.     Pulses: Normal pulses.          Radial pulses are 2+ on the right side and 2+ on the left side.     Heart sounds: Normal heart sounds. No murmur. No friction rub. No gallop.   Pulmonary:     Effort: Pulmonary effort is normal.     Breath sounds: Normal breath sounds. No decreased breath sounds.     Comments: Lungs clear to auscultation bilaterally.  Symmetric chest rise.  No wheezing, rales, rhonchi. Chest:       Comments: Palpation noted anterior chest wall overlying the left clavicle.  No deformity or crepitus noted.  No tenting of skin. Abdominal:     Palpations: Abdomen is soft. Abdomen is not rigid.     Tenderness: There is no abdominal tenderness. There is no guarding.     Comments: Abdomen is soft, non-distended, non-tender. No rigidity, No guarding. No peritoneal signs.  Musculoskeletal:  Normal range of motion.     Thoracic back: She exhibits no tenderness.     Lumbar back: She exhibits no tenderness.     Comments: Palpation noted to left shoulder.  No deformity or crepitus noted.  No bony tenderness noted to left elbow, left forearm.  Tenderness palpation of the ulnar aspect of the left wrist.  No deformity or crepitus noted.  Flexion/tension of left wrist intact any difficulty.  No tenderness palpation of the right upper extremity.  No tenderness palpation noted bilateral lower extremities.  FROM of bilateral lower extremities. Bilateral lower extremities are symmetric in appearance without any shortening or rotation.  Skin:    General: Skin is warm and dry.     Capillary Refill: Capillary refill takes less than 2 seconds.     Comments: Good distal cap refill. LUE is not dusky in appearance or cool to touch.  No bruising or ecchymosis noted to abdomen, back, chest.  Neurological:     Mental Status: She is alert and oriented to person, place, and time.     Comments: Sensation intact along major nerve distributions of LUE  Psychiatric:        Speech: Speech normal.      ED Treatments / Results  Labs (all labs ordered are listed, but only abnormal results are displayed) Labs Reviewed - No data to display  EKG None  Radiology Dg Chest 2 View  Result Date: 11/01/2018 CLINICAL DATA:  Post fall from curb, now with left shoulder pain. EXAM: CHEST - 2 VIEW COMPARISON:  12/22/2017; 11/18/2017; 12/20/2015; chest CT-04/01/2016 FINDINGS: Grossly unchanged cardiac silhouette and mediastinal contours. Large retrocardiac air and fluid containing structure compatible with known hiatal hernia. Left basilar linear heterogeneous opacities are unchanged and favored to represent atelectasis or scar. No new focal airspace opacities. No pleural effusion or pneumothorax. No evidence of edema. No acute osseous abnormalities. IMPRESSION: 1.  No acute cardiopulmonary disease. 2. Large hiatal  hernia, similar to remote chest CT performed 03/2016. Electronically Signed   By: Jenny Reichmann  Watts M.D.   On: 11/01/2018 17:55   Dg Wrist Complete Left  Result Date: 11/01/2018 CLINICAL DATA:  Post fall from curb, now with left wrist pain. EXAM: LEFT WRIST - COMPLETE 3+ VIEW COMPARISON:  Left shoulder radiographs-earlier same day FINDINGS: Osteopenia. No definite fracture or dislocation. Moderate severe degenerative change involving the STT joints of base of the thumb with joint space loss, subchondral sclerosis and osteophytosis. No evidence of chondrocalcinosis. No definite erosions. Regional soft tissues appear normal. No radiopaque foreign body. IMPRESSION: 1. Osteopenia without definitive fracture or dislocation. 2. Moderate to severe degenerative change of the base of the thumb. Electronically Signed   By: Sandi Mariscal M.D.   On: 11/01/2018 17:56   Ct Head Wo Contrast  Result Date: 11/01/2018 CLINICAL DATA:  Fall with left-sided head injury.  Headache. EXAM: CT HEAD WITHOUT CONTRAST TECHNIQUE: Contiguous axial images were obtained from the base of the skull through the vertex without intravenous contrast. COMPARISON:  CT head 04/01/2016. FINDINGS: Brain: There is no evidence of acute intracranial hemorrhage, mass lesion, brain edema or extra-axial fluid collection. The ventricles and subarachnoid spaces are appropriately sized for age. There is no CT evidence of acute cortical infarction. There are mild chronic small vessel ischemic changes in the periventricular white matter. Small focus involving the anterior limb of the right internal capsule appears stable based on the coronal images. Vascular: Intracranial vascular calcifications. No hyperdense vessel identified. Skull: Negative for fracture or focal lesion. Sinuses/Orbits: The visualized paranasal sinuses and mastoid air cells are clear. No orbital abnormalities are seen. Other: None. IMPRESSION: Stable examination without acute intracranial findings.  Mild chronic small vessel ischemic changes. Electronically Signed   By: Richardean Sale M.D.   On: 11/01/2018 19:55   Dg Shoulder Left  Result Date: 11/01/2018 CLINICAL DATA:  Post fall from curb now with left shoulder pain. EXAM: LEFT SHOULDER - 2+ VIEW COMPARISON:  Chest radiograph-earlier same day FINDINGS: Osteopenia. No fracture or dislocation. Glenohumeral joint spaces appear preserved given obliquity. Mild degenerative change of the left AC joint with joint space loss, subchondral sclerosis and osteophytosis. No definite evidence of calcific tendinitis. Limited visualization of the adjacent thorax demonstrates atherosclerotic plaque within the thoracic aorta. Regional soft tissues appear normal. IMPRESSION: 1. No acute findings. 2. Mild left AC joint degenerative change. Electronically Signed   By: Sandi Mariscal M.D.   On: 11/01/2018 17:53   Dg Hip Unilat W Or Wo Pelvis 2-3 Views Left  Result Date: 11/01/2018 CLINICAL DATA:  Patient fell off curb complaining of left hip pain. EXAM: DG HIP (WITH OR WITHOUT PELVIS) 2-3V LEFT COMPARISON:  CT 04/01/2016 reformats FINDINGS: Prominent rim of osteophytes are identified at the femoral head-neck juncture which can obscure subtle impaction fractures. If there is pain out of proportion to radiographic findings, dedicated CT of pelvis for direct comparison may prove useful. Axial joint space narrowing likely degenerative in etiology is noted. The pubic rami sacroiliac joints and pubic symphysis appear intact. No significant soft tissue mass, mineralization or significant swelling is identified. IMPRESSION: Prominent rim of osteophytes at the femoral head-neck junction of the left hip which can obscure subtle impaction fractures. If there is pain out of proportion to radiographic findings, dedicated CT of the pelvis may prove useful. Otherwise, findings would be compatible with osteoarthritis of the left hip. Electronically Signed   By: Ashley Royalty M.D.   On:  11/01/2018 21:55    Procedures Procedures (including critical care time)  Medications Ordered in ED Medications  morphine 4 MG/ML injection 4 mg (4 mg Intravenous Given 11/01/18 1802)  ondansetron (ZOFRAN) injection 4 mg (4 mg Intravenous Given 11/01/18 1801)     Initial Impression / Assessment and Plan / ED Course  I have reviewed the triage vital signs and the nursing notes.  Pertinent labs & imaging results that were available during my care of the patient were reviewed by me and considered in my medical decision making (see chart for details).     85 year old female who presents for evaluation of left shoulder pain, head pain after mechanical fall.  She reports history of gait abnormalities and reports she has ambulate with a cane because of that.  She reports her cane got stuck which caused her to fall and land on concrete.  No LOC.  She is not currently on blood thinners.  Patient reports pain to the left shoulder and wrist.  No bony tenderness noted on neck exam. Patient is afebrile, non-toxic appearing, sitting comfortably on examination table.  No signs reviewed.  Patient is hypertensive.  Likely secondary to pain. Patient is neurovascularly intact.  CT head negative for any acute abnormalities.  X-ray of shoulder and wrist without any acute bony abnormality.  Chest x-ray negative for any acute bony abnormalities.  Additionally, I see no evidence of clavicle deformity on x-ray.  Discussed results with patient.  Patient's blood pressure still elevated but she had not yet received pain medication.  Patient states that she used to take hydrochlorothiazide for blood pressure but has not taken in several years.  We will reassess after pain medication.  Repeat blood pressure improved significantly after pain medications.  I went to discharge patient and discussed her discharge instructions with her.  On my arrival, patient started complaining of left hip pain which she had previously not  been having.  I ambulated patient she was able to ambulate and bear weight on the hip.  Additionally, she was able to have good range of motion of the left hip without any significant pain.  Internal and external rotation intact without any difficulties.  Given complaints of hip pain as well as history of fall, will plan for x-ray evaluation.  X-ray reviewed.  No obvious fracture.  There was mention of prominent rim of osteophytes at the femoral head neck junction of the left hip which can obscure subtle impact fractures.  If pain is in proportion, they recommend dedicated CT pelvis.  Discussed  results with patient.  Patient is able to tolerate range of motion of the hip without any significant pain.  Additionally, she was able to ambulate and bear weight on the leg.  At this time, I do not suspect an occult fracture given ability to ambulate and bear weight and reassuring exam.  I suspect that her pain is mostly musculoskeletal. I offered further imaging here in the ED if patient was concerned, but patient declined.  I did instruct her to follow-up with her primary care doctor if she continues to have pain. At this time, patient exhibits no emergent life-threatening condition that require further evaluation in ED or admission.  At this time, patient is stable for discharge.  Encouraged at home supportive care measures as well as primary care follow-up. Patient had ample opportunity for questions and discussion. All patient's questions were answered with full understanding. Strict return precautions discussed. Patient expresses understanding and agreement to plan.   Portions of this note were generated with [employer]. Dictation errors may occur despite best attempts at  proofreading.   Final Clinical Impressions(s) / ED Diagnoses   Final diagnoses:  Fall, initial encounter  Acute pain of left shoulder  Pain of left hip joint    ED Discharge Orders         Ordered     HYDROcodone-acetaminophen (NORCO/VICODIN) 5-325 MG tablet  Every 6 hours PRN     11/01/18 2240           Volanda Napoleon, PA-C 11/02/18 0006    Little, Wenda Overland, MD 11/02/18 2016

## 2018-11-01 NOTE — Discharge Instructions (Addendum)
Your x-rays today reveal no broken bones.  You will probably be very sore for the next few days.  You can wear the sling for support but do not wear 24/7 as this may cause frozen shoulder syndrome.  Make sure at home you are taking the arm out of the sling and doing gentle range of motion exercises.  As we discussed today, your hip x-ray was negative for any obvious fracture.  I was reassured by the fact that you can walk on it.  If it continues to cause you pain or you have worsening issues with it, follow-up with your primary care doctor as you may need repeat imaging  You can take 1000 mg of Tylenol.  Do not exceed 4000 mg of Tylenol a day.  Your chest x-ray did show a hiatal hernia.  You have had this previously.  Please follow-up with your primary doctor regarding.  Return the emergency department for any worsening pain, difficulty walking, vomiting, chest pain, difficulty breathing or any other worsening or concerning symptoms.

## 2018-11-08 ENCOUNTER — Other Ambulatory Visit: Payer: Self-pay | Admitting: Cardiology

## 2018-11-30 ENCOUNTER — Ambulatory Visit: Payer: Medicare Other | Admitting: Family Medicine

## 2019-01-02 ENCOUNTER — Other Ambulatory Visit: Payer: Self-pay | Admitting: Cardiovascular Disease

## 2019-01-20 ENCOUNTER — Other Ambulatory Visit: Payer: Self-pay | Admitting: Cardiovascular Disease

## 2019-01-31 ENCOUNTER — Other Ambulatory Visit: Payer: Self-pay | Admitting: Cardiovascular Disease

## 2019-02-04 ENCOUNTER — Other Ambulatory Visit: Payer: Self-pay | Admitting: Pulmonary Disease

## 2019-02-14 ENCOUNTER — Encounter (HOSPITAL_COMMUNITY): Payer: Self-pay | Admitting: Family Medicine

## 2019-02-14 ENCOUNTER — Emergency Department (HOSPITAL_COMMUNITY): Payer: Medicare Other

## 2019-02-14 ENCOUNTER — Emergency Department (HOSPITAL_COMMUNITY)
Admission: EM | Admit: 2019-02-14 | Discharge: 2019-02-14 | Disposition: A | Discharge status: Home / Self Care | Payer: Medicare Other | Attending: Emergency Medicine | Admitting: Emergency Medicine

## 2019-02-14 DIAGNOSIS — I10 Essential (primary) hypertension: Secondary | ICD-10-CM | POA: Insufficient documentation

## 2019-02-14 DIAGNOSIS — Z7982 Long term (current) use of aspirin: Secondary | ICD-10-CM | POA: Diagnosis not present

## 2019-02-14 DIAGNOSIS — Z79899 Other long term (current) drug therapy: Secondary | ICD-10-CM | POA: Diagnosis not present

## 2019-02-14 DIAGNOSIS — Y92009 Unspecified place in unspecified non-institutional (private) residence as the place of occurrence of the external cause: Secondary | ICD-10-CM | POA: Insufficient documentation

## 2019-02-14 DIAGNOSIS — S0083XA Contusion of other part of head, initial encounter: Secondary | ICD-10-CM | POA: Insufficient documentation

## 2019-02-14 DIAGNOSIS — Y939 Activity, unspecified: Secondary | ICD-10-CM | POA: Diagnosis not present

## 2019-02-14 DIAGNOSIS — S0990XA Unspecified injury of head, initial encounter: Secondary | ICD-10-CM | POA: Diagnosis not present

## 2019-02-14 DIAGNOSIS — R5381 Other malaise: Secondary | ICD-10-CM | POA: Diagnosis not present

## 2019-02-14 DIAGNOSIS — R0782 Intercostal pain: Secondary | ICD-10-CM | POA: Diagnosis not present

## 2019-02-14 DIAGNOSIS — Y999 Unspecified external cause status: Secondary | ICD-10-CM | POA: Diagnosis not present

## 2019-02-14 DIAGNOSIS — W0110XA Fall on same level from slipping, tripping and stumbling with subsequent striking against unspecified object, initial encounter: Secondary | ICD-10-CM | POA: Insufficient documentation

## 2019-02-14 DIAGNOSIS — S40811A Abrasion of right upper arm, initial encounter: Secondary | ICD-10-CM | POA: Diagnosis not present

## 2019-02-14 DIAGNOSIS — J449 Chronic obstructive pulmonary disease, unspecified: Secondary | ICD-10-CM | POA: Diagnosis not present

## 2019-02-14 DIAGNOSIS — T148XXA Other injury of unspecified body region, initial encounter: Secondary | ICD-10-CM | POA: Diagnosis not present

## 2019-02-14 DIAGNOSIS — S199XXA Unspecified injury of neck, initial encounter: Secondary | ICD-10-CM | POA: Diagnosis not present

## 2019-02-14 DIAGNOSIS — S161XXA Strain of muscle, fascia and tendon at neck level, initial encounter: Secondary | ICD-10-CM | POA: Diagnosis not present

## 2019-02-14 DIAGNOSIS — W19XXXA Unspecified fall, initial encounter: Secondary | ICD-10-CM

## 2019-02-14 MED ORDER — ACETAMINOPHEN 325 MG PO TABS
650.0000 mg | ORAL_TABLET | Freq: Once | ORAL | Status: AC
  Administered 2019-02-14: 650 mg via ORAL
  Filled 2019-02-14: qty 2

## 2019-02-14 NOTE — Discharge Instructions (Signed)
Please follow-up with your primary care provider. The CTs and x-rays that we did today were negative for acute abnormality including fracture. Return to the ED if you start to have worsening symptoms, additional injuries or falls, worsening headache, blurry vision, numbness in arms or legs.

## 2019-02-14 NOTE — ED Notes (Signed)
Patient ambulated with one person assistance and tolerated it well. Patient was able to manage herself in the restroom.

## 2019-02-14 NOTE — ED Notes (Signed)
Bed: WA20 Expected date:  Expected time:  Means of arrival:  Comments: EMS-85 yo fall/hematoma

## 2019-02-14 NOTE — ED Provider Notes (Signed)
Physical Exam  BP (!) 189/88 (BP Location: Left Arm)   Pulse 69   Temp 97.7 F (36.5 C) (Oral)   Resp 18   Ht 5\' 2"  (1.575 m)   Wt 56.7 kg   SpO2 97%   BMI 22.86 kg/m   Physical Exam Vitals signs and nursing note reviewed.  Constitutional:      General: She is not in acute distress.    Appearance: She is well-developed. She is not diaphoretic.  HENT:     Head: Normocephalic and atraumatic.  Eyes:     General: No scleral icterus.    Conjunctiva/sclera: Conjunctivae normal.  Neck:     Musculoskeletal: Normal range of motion.  Pulmonary:     Effort: Pulmonary effort is normal. No respiratory distress.  Skin:    Findings: No rash.  Neurological:     Mental Status: She is alert.     ED Course/Procedures     Procedures  MDM  From previous provider PA [place]. Please see their note for further detail.  Briefly, patient with past medical history of hypertension, COPD, osteoporosis presents to ED for mechanical fall.  She about to step in her kitchen which has an uneven floor and ended up refilling flat onto her face.  Did have a head injury.  She complains of headache, right-sided rib pain and neck pain.  She is not currently anticoagulated.  Patient overall well-appearing on exam with no deficits neurological exam.  Plan is to obtain x-ray of the ribs, CT of the head and cervical spine and reassess.  Dg Ribs Unilateral W/chest Right  Result Date: 02/14/2019 CLINICAL DATA:  Right rib pain after fall. EXAM: RIGHT RIBS AND CHEST - 3+ VIEW COMPARISON:  Radiographs of November 01, 2018. FINDINGS: No fracture or other bone lesions are seen involving the ribs. There is no evidence of pneumothorax or pleural effusion. Both lungs are clear. Heart size and mediastinal contours are within normal limits. Large hiatal hernia is again noted. IMPRESSION: Normal right ribs. No acute cardiopulmonary abnormality seen. Stable large hiatal hernia. Electronically Signed   By: Marijo Conception M.D.    On: 02/14/2019 18:48   Ct Head Wo Contrast  Result Date: 02/14/2019 CLINICAL DATA:  Fall, head injury EXAM: CT HEAD WITHOUT CONTRAST CT CERVICAL SPINE WITHOUT CONTRAST TECHNIQUE: Multidetector CT imaging of the head and cervical spine was performed following the standard protocol without intravenous contrast. Multiplanar CT image reconstructions of the cervical spine were also generated. COMPARISON:  CT head 11/01/2018 FINDINGS: CT HEAD FINDINGS Brain: Mild atrophy. Chronic microvascular ischemic changes in the white matter. Negative for acute infarct, hemorrhage, or mass. Vascular: Negative for hyperdense vessel Skull: Negative for skull fracture.  Right frontal scalp hematoma Sinuses/Orbits: Paranasal sinuses clear. Right cataract surgery. No orbital mass. Other: None CT CERVICAL SPINE FINDINGS Alignment: Normal Skull base and vertebrae: Negative for fracture Soft tissues and spinal canal: Mild goiter. Atherosclerotic calcification carotid artery bilaterally. No adenopathy in the neck. Disc levels: Mild disc degeneration and spurring C3 through C7. No significant spinal stenosis. Upper chest: Apically scarring bilaterally without acute abnormality in the lung apices Other: None IMPRESSION: 1. No acute intracranial abnormality. Atrophy and chronic microvascular ischemia 2. Right frontal scalp hematoma.  Negative for skull fracture 3. Negative for cervical spine fracture. Electronically Signed   By: Franchot Gallo M.D.   On: 02/14/2019 19:05   Ct Cervical Spine Wo Contrast  Result Date: 02/14/2019 CLINICAL DATA:  Fall, head injury EXAM: CT HEAD WITHOUT  CONTRAST CT CERVICAL SPINE WITHOUT CONTRAST TECHNIQUE: Multidetector CT imaging of the head and cervical spine was performed following the standard protocol without intravenous contrast. Multiplanar CT image reconstructions of the cervical spine were also generated. COMPARISON:  CT head 11/01/2018 FINDINGS: CT HEAD FINDINGS Brain: Mild atrophy. Chronic  microvascular ischemic changes in the white matter. Negative for acute infarct, hemorrhage, or mass. Vascular: Negative for hyperdense vessel Skull: Negative for skull fracture.  Right frontal scalp hematoma Sinuses/Orbits: Paranasal sinuses clear. Right cataract surgery. No orbital mass. Other: None CT CERVICAL SPINE FINDINGS Alignment: Normal Skull base and vertebrae: Negative for fracture Soft tissues and spinal canal: Mild goiter. Atherosclerotic calcification carotid artery bilaterally. No adenopathy in the neck. Disc levels: Mild disc degeneration and spurring C3 through C7. No significant spinal stenosis. Upper chest: Apically scarring bilaterally without acute abnormality in the lung apices Other: None IMPRESSION: 1. No acute intracranial abnormality. Atrophy and chronic microvascular ischemia 2. Right frontal scalp hematoma.  Negative for skull fracture 3. Negative for cervical spine fracture. Electronically Signed   By: Franchot Gallo M.D.   On: 02/14/2019 19:05    Imaging is reassuring, negative for acute abnormality.  She is up-to-date on her tetanus.  She is ambulatory here without difficulty.  She was given Tylenol for her headache which she states she takes at home for pain control.  We will have her follow-up with PCP.  Advised to return to ED for any severe worsening symptoms.   Patient is hemodynamically stable, in NAD, and able to ambulate in the ED. Evaluation does not show pathology that would require ongoing emergent intervention or inpatient treatment. I explained the diagnosis to the patient. Pain has been managed and has no complaints prior to discharge. Patient is comfortable with above plan and is stable for discharge at this time. All questions were answered prior to disposition. Strict return precautions for returning to the ED were discussed. Encouraged follow up with PCP.   An After Visit Summary was printed and given to the patient.   Portions of this note were generated  with [employer]. Dictation errors may occur despite best attempts at proofreading.       Delia Heady, PA-C 02/14/19 1942    Blanchie Dessert, MD 02/14/19 2253

## 2019-02-14 NOTE — ED Triage Notes (Signed)
Patient is from home and transported via [facility]. Patient tripped and had a fall. She landed face down, has a hematoma to the forehead, and bruise over the right eye. According to EMS, she has no other complaints.

## 2019-02-14 NOTE — ED Triage Notes (Signed)
Now, patient is complaining of neck pain, headache, and left shoulder.

## 2019-02-14 NOTE — ED Provider Notes (Signed)
[facility] DEPT Provider Note   CSN: [id number] Arrival date & time: 02/14/19  1701    History   Chief Complaint Chief Complaint  Patient presents with  . Fall    HPI Tina Mercado is a 85 y.o. female.     HPI 85 year old female past medical history significant for hypertension, COPD, GERD, osteoporosis presents to the emergency department today for evaluation mechanical fall.  Patient was at home and states that she is having some repair work done in her living room.  She has some areas of the floor that are not level or stable.  States that she forgot to step up to the kitchen and fell flat onto her face.  Reports hitting her head on the ground.  Denies LOC.  Patient reports having a headache to the front of her head.  She also reports some pain to the right side of her rib cage but does not know if she hit the right side of her ribs.  Patient denies any abdominal pain.  No vision changes.  She does have a small skin tear to her right arm.  Tetanus shot up-to-date.  Patient denies any chest pain or shortness of breath.  No LOC.  Patient not on blood thinners except for aspirin.  Has taken no medication for symptoms prior to arrival.  Has not been ambulatory since the event. Past Medical History:  Diagnosis Date  . Allergy   . Arrhythmia   . Cataract   . COPD (chronic obstructive pulmonary disease) ([place])   . GERD (gastroesophageal reflux disease)   . Glaucoma   . Heart murmur   . History of anemia   . History of diastolic dysfunction   . History of seasonal allergies   . Hyperlipidemia   . Hypertension   . Large hiatal hernia    see on thoracic spine xray  . Mitral regurgitation    mild to moderate  . Osteoporosis   . Reflux     Patient Active Problem List   Diagnosis Date Noted  . Prediabetes 09/28/2017  . Dizziness 09/15/2017  . Neuropathy 07/16/2017  . COPD mixed type ([place]) 04/10/2015  . Laryngopharyngeal reflux (LPR) 04/10/2015   . Chronic laryngitis 08/17/2014  . Blindness and low vision 04/13/2013  . Osteoporosis 12/23/2012  . Bronchitis 10/30/2011  . Benign hypertensive heart disease without heart failure 08/05/2011  . Pure hypercholesterolemia 08/05/2011  . Mitral regurgitation   . History of anemia   . History of diastolic dysfunction   . GERD (gastroesophageal reflux disease)   . History of seasonal allergies   . Large hiatal hernia 11/23/2004    Past Surgical History:  Procedure Laterality Date  . APPENDECTOMY    . BREAST SURGERY    . childbirth     x 1  . CORNEAL TRANSPLANT  july 2007  . EYE SURGERY    . TONSILLECTOMY AND ADENOIDECTOMY     age 73yrs     OB History   No obstetric history on file.      Home Medications    Prior to Admission medications   Medication Sig Start Date End Date Taking? Authorizing Provider  acetaminophen (TYLENOL) 500 MG tablet Take 1,000 mg by mouth every 6 (six) hours as needed for mild pain.     [provider]  ADVAIR HFA 115-21 MCG/ACT inhaler TAKE 2 PUFFS BY MOUTH TWICE A DAY Patient taking differently: Inhale 2 puffs into the lungs 2 (two) times daily.  09/29/18  Noralee Space, MD  amitriptyline (ELAVIL) 10 MG tablet TAKE 1 TABLET BY MOUTH EVERYDAY AT BEDTIME Patient taking differently: Take 10 mg by mouth at bedtime.  10/15/18   Shawnee Knapp, MD  aspirin EC 81 MG tablet Take 81 mg by mouth at bedtime.     [provider]  Cholecalciferol (VITAMIN D) 50 MCG (2000 UT) CAPS Take 2,000 Units by mouth at bedtime.    [provider]  diclofenac (VOLTAREN) 75 MG EC tablet TAKE 1 TABLET (75 MG TOTAL) BY MOUTH EVERY MORNING BEFORE A BUSY DAY AS NEEDED FOR MILD PAIN Patient not taking: Reported on 11/01/2018 04/30/18   Shawnee Knapp, MD  docusate sodium (COLACE) 100 MG capsule Take 100 mg by mouth at bedtime.    [provider]  esomeprazole (NEXIUM) 40 MG capsule Take 1 capsule (40 mg total) by mouth at bedtime. 02/08/19   Parrett,  Fonnie Mu, NP  fluocinonide cream (LIDEX) 7.16 % Apply 1 application topically 2 (two) times daily as needed (rash).  01/29/16   [provider]  hydrochlorothiazide (HYDRODIURIL) 12.5 MG tablet Take 1-2 tablets (12.5-25 mg total) by mouth daily as needed (elevated blood pressures). 10/15/18   Shawnee Knapp, MD  HYDROcodone-acetaminophen (NORCO/VICODIN) 5-325 MG tablet Take 1 tablet by mouth every 6 (six) hours as needed. 11/01/18   Volanda Napoleon, PA-C  ketorolac (ACULAR) 0.5 % ophthalmic solution Place 1 drop into the right eye 2 (two) times daily.    [provider]  rosuvastatin (CRESTOR) 10 MG tablet TAKE 1 TABLET BY MOUTH DAILY. MUST SCHEDULE APPT WITH PROVIDER TO GET FUTURE REFILLS - 2ND ATTEMPT 01/31/19   Skeet Latch, MD  timolol (BETIMOL) 0.5 % ophthalmic solution Place 1 drop into both eyes 2 (two) times daily.    [provider]  traMADol (ULTRAM) 50 MG tablet Take 50 mg by mouth every 6 (six) hours as needed for moderate pain.    [provider]    Family History Family History  Problem Relation Age of Onset  . Heart disease Mother   . Emphysema Father   . Emphysema Brother   . COPD Brother     Social History Social History   Tobacco Use  . Smoking status: Never Smoker  . Smokeless tobacco: Never Used  Substance Use Topics  . Alcohol use: No    Alcohol/week: 0.0 standard drinks  . Drug use: No     Allergies   Contrast media [iodinated diagnostic agents]; Antihistamines, diphenhydramine-type; Celebrex [celecoxib]; Darvocet [propoxyphene n-acetaminophen]; Demerol; Diphenhydramine hcl; Feldene [piroxicam]; Gabapentin; Meperidine; Propoxyphene; and Iodine-131   Review of Systems Review of Systems  Constitutional: Negative for chills and fever.  Eyes: Negative for visual disturbance.  Gastrointestinal: Negative for abdominal pain.  Musculoskeletal: Positive for arthralgias, myalgias and neck pain.  Skin: Positive for wound.   Neurological: Positive for headaches. Negative for dizziness, syncope and light-headedness.     Physical Exam Updated Vital Signs BP (!) 174/137 (BP Location: Left Arm)   Pulse 76   Temp 97.7 F (36.5 C) (Oral)   Resp 20   Ht 5\' 2"  (1.575 m)   Wt 56.7 kg   SpO2 96%   BMI 22.86 kg/m   Physical Exam Neurological:     Coordination: Abnormal coordination:     Physical Exam  Constitutional: Pt is oriented to person, place, and time. Appears well-developed and well-nourished. No distress.  HENT:  Head: Normocephalic.  No bilateral hemotympanum or septal hematoma.  No  battle signs or raccoon eyes.  Does have a small hematoma to the right forehead. Ears: No bilateral hemotympanum. Nose: Nose normal. No septal hematoma. Mouth/Throat: Uvula is midline, oropharynx is clear and moist and mucous membranes are normal.  Eyes: Conjunctivae and EOM are normal. Pupils are equal, round, and reactive to light.  Neck: No spinous process tenderness and no muscular tenderness present. No rigidity. Normal range of motion present.  Full ROM without pain No midline cervical tenderness No crepitus, deformity or step-offs  No paraspinal tenderness  Cardiovascular: Normal rate, regular rhythm and intact distal pulses.   Pulses:      Radial pulses are 2+ on the right side, and 2+ on the left side.       Dorsalis pedis pulses are 2+ on the right side, and 2+ on the left side.       Posterior tibial pulses are 2+ on the right side, and 2+ on the left side.  Pulmonary/Chest: Effort normal and breath sounds normal. No accessory muscle usage. No respiratory distress. No decreased breath sounds. No wheezes. No rhonchi. No rales. Exhibits no tenderness and no bony tenderness.  No flail segment, crepitus or deformity Equal chest expansion  Abdominal: Soft. Normal appearance and bowel sounds are normal. There is no tenderness. There is no rigidity, no guarding and no CVA tenderness.  Abd soft and nontender   Musculoskeletal: Normal range of motion.       Thoracic back: Exhibits normal range of motion.       Lumbar back: Exhibits normal range of motion.  Full range of motion of the T-spine and L-spine No tenderness to palpation of the spinous processes of the T-spine or L-spine No crepitus, deformity or step-offs No tenderness to palpation of the paraspinous muscles of the L-spine  Lymphadenopathy:    Pt has no cervical adenopathy.  Neurological: Pt is alert and oriented to person, place, and time. Normal reflexes. No cranial nerve deficit. GCS eye subscore is 4. GCS verbal subscore is 5. GCS motor subscore is 6.  Reflex Scores:      Bicep reflexes are 2+ on the right side and 2+ on the left side.      Brachioradialis reflexes are 2+ on the right side and 2+ on the left side.      Patellar reflexes are 2+ on the right side and 2+ on the left side.      Achilles reflexes are 2+ on the right side and 2+ on the left side. Speech is clear and goal oriented, follows commands Normal 5/5 strength in upper and lower extremities bilaterally including dorsiflexion and plantar flexion, strong and equal grip strength Sensation normal to light and sharp touch Moves extremities without ataxia, coordination intact No Clonus  Skin: Skin is warm and dry. No rash noted. Pt is not diaphoretic. No erythema. Small skin tear to the right forearm without any bleeding.  This is approximately 1 cm.  No indication for repair. Psychiatric: Normal mood and affect.  Nursing note and vitals reviewed.     ED Treatments / Results  Labs (all labs ordered are listed, but only abnormal results are displayed) Labs Reviewed - No data to display  EKG None  Radiology No results found.  Procedures Procedures (including critical care time)  Medications Ordered in ED Medications - No data to display   Initial Impression / Assessment and Plan / ED Course  I have reviewed the triage vital signs and the nursing notes.   Pertinent labs &  imaging results that were available during my care of the patient were reviewed by me and considered in my medical decision making (see chart for details).        Patient presents the ED for evaluation of head injury after mechanical fall.  Patient has no signs of intrathoracic or intra-abdominal trauma.  Neurovascular intact in lower extremities.  CT imaging of head neck will be obtained along with x-ray of the right rib cage.  Controlled at this time.  Patient to be discharged home depending on imaging as long patient can ambulate with normal gait in the ED.   Care handoff to [place]. Pt has pending at this time ct imagine results and ambulation  Disposition likely home pending lab results. Care dicussed and plan agreed upon with oncoming PA. Pt updated on plan of care and is currently hemodynamically stable at this time with normal vs.     Final Clinical Impressions(s) / ED Diagnoses   Final diagnoses:  None    ED Discharge Orders    None       Aaron Edelman 02/14/19 1806    Blanchie Dessert, MD 02/14/19 2253

## 2019-03-02 ENCOUNTER — Encounter (HOSPITAL_COMMUNITY): Payer: Self-pay | Admitting: Emergency Medicine

## 2019-03-02 ENCOUNTER — Emergency Department (HOSPITAL_COMMUNITY): Payer: Medicare Other

## 2019-03-02 ENCOUNTER — Other Ambulatory Visit: Payer: Self-pay

## 2019-03-02 ENCOUNTER — Inpatient Hospital Stay (HOSPITAL_COMMUNITY)
Admission: EM | Admit: 2019-03-02 | Discharge: 2019-03-07 | DRG: 480 | Disposition: A | Discharge status: Skilled Nursing Facility | Payer: Medicare Other | Attending: Internal Medicine | Admitting: Internal Medicine

## 2019-03-02 DIAGNOSIS — W19XXXA Unspecified fall, initial encounter: Secondary | ICD-10-CM

## 2019-03-02 DIAGNOSIS — Z7951 Long term (current) use of inhaled steroids: Secondary | ICD-10-CM

## 2019-03-02 DIAGNOSIS — S0083XA Contusion of other part of head, initial encounter: Secondary | ICD-10-CM | POA: Diagnosis present

## 2019-03-02 DIAGNOSIS — Z825 Family history of asthma and other chronic lower respiratory diseases: Secondary | ICD-10-CM

## 2019-03-02 DIAGNOSIS — Z7401 Bed confinement status: Secondary | ICD-10-CM | POA: Diagnosis not present

## 2019-03-02 DIAGNOSIS — Z947 Corneal transplant status: Secondary | ICD-10-CM | POA: Diagnosis not present

## 2019-03-02 DIAGNOSIS — Z01818 Encounter for other preprocedural examination: Secondary | ICD-10-CM | POA: Diagnosis not present

## 2019-03-02 DIAGNOSIS — S72002A Fracture of unspecified part of neck of left femur, initial encounter for closed fracture: Secondary | ICD-10-CM | POA: Diagnosis not present

## 2019-03-02 DIAGNOSIS — I340 Nonrheumatic mitral (valve) insufficiency: Secondary | ICD-10-CM | POA: Diagnosis not present

## 2019-03-02 DIAGNOSIS — S72142A Displaced intertrochanteric fracture of left femur, initial encounter for closed fracture: Secondary | ICD-10-CM | POA: Diagnosis not present

## 2019-03-02 DIAGNOSIS — M810 Age-related osteoporosis without current pathological fracture: Secondary | ICD-10-CM | POA: Diagnosis present

## 2019-03-02 DIAGNOSIS — K5900 Constipation, unspecified: Secondary | ICD-10-CM | POA: Diagnosis not present

## 2019-03-02 DIAGNOSIS — J9601 Acute respiratory failure with hypoxia: Secondary | ICD-10-CM | POA: Diagnosis not present

## 2019-03-02 DIAGNOSIS — S0003XA Contusion of scalp, initial encounter: Secondary | ICD-10-CM | POA: Diagnosis present

## 2019-03-02 DIAGNOSIS — R2689 Other abnormalities of gait and mobility: Secondary | ICD-10-CM | POA: Diagnosis not present

## 2019-03-02 DIAGNOSIS — R296 Repeated falls: Secondary | ICD-10-CM | POA: Diagnosis present

## 2019-03-02 DIAGNOSIS — I951 Orthostatic hypotension: Secondary | ICD-10-CM | POA: Diagnosis not present

## 2019-03-02 DIAGNOSIS — K219 Gastro-esophageal reflux disease without esophagitis: Secondary | ICD-10-CM | POA: Diagnosis not present

## 2019-03-02 DIAGNOSIS — K449 Diaphragmatic hernia without obstruction or gangrene: Secondary | ICD-10-CM | POA: Diagnosis present

## 2019-03-02 DIAGNOSIS — Z111 Encounter for screening for respiratory tuberculosis: Secondary | ICD-10-CM | POA: Diagnosis not present

## 2019-03-02 DIAGNOSIS — J449 Chronic obstructive pulmonary disease, unspecified: Secondary | ICD-10-CM

## 2019-03-02 DIAGNOSIS — T402X5A Adverse effect of other opioids, initial encounter: Secondary | ICD-10-CM | POA: Diagnosis not present

## 2019-03-02 DIAGNOSIS — I119 Hypertensive heart disease without heart failure: Secondary | ICD-10-CM | POA: Diagnosis not present

## 2019-03-02 DIAGNOSIS — I10 Essential (primary) hypertension: Secondary | ICD-10-CM | POA: Diagnosis not present

## 2019-03-02 DIAGNOSIS — Z7982 Long term (current) use of aspirin: Secondary | ICD-10-CM | POA: Diagnosis not present

## 2019-03-02 DIAGNOSIS — W01198A Fall on same level from slipping, tripping and stumbling with subsequent striking against other object, initial encounter: Secondary | ICD-10-CM | POA: Diagnosis present

## 2019-03-02 DIAGNOSIS — E871 Hypo-osmolality and hyponatremia: Secondary | ICD-10-CM | POA: Diagnosis not present

## 2019-03-02 DIAGNOSIS — L299 Pruritus, unspecified: Secondary | ICD-10-CM | POA: Diagnosis not present

## 2019-03-02 DIAGNOSIS — Z9181 History of falling: Secondary | ICD-10-CM | POA: Diagnosis not present

## 2019-03-02 DIAGNOSIS — I440 Atrioventricular block, first degree: Secondary | ICD-10-CM | POA: Diagnosis present

## 2019-03-02 DIAGNOSIS — M2550 Pain in unspecified joint: Secondary | ICD-10-CM | POA: Diagnosis not present

## 2019-03-02 DIAGNOSIS — Z8249 Family history of ischemic heart disease and other diseases of the circulatory system: Secondary | ICD-10-CM

## 2019-03-02 DIAGNOSIS — Y92009 Unspecified place in unspecified non-institutional (private) residence as the place of occurrence of the external cause: Secondary | ICD-10-CM | POA: Diagnosis not present

## 2019-03-02 DIAGNOSIS — Z20828 Contact with and (suspected) exposure to other viral communicable diseases: Secondary | ICD-10-CM | POA: Diagnosis not present

## 2019-03-02 DIAGNOSIS — M6281 Muscle weakness (generalized): Secondary | ICD-10-CM | POA: Diagnosis not present

## 2019-03-02 DIAGNOSIS — H019 Unspecified inflammation of eyelid: Secondary | ICD-10-CM | POA: Diagnosis not present

## 2019-03-02 DIAGNOSIS — H40059 Ocular hypertension, unspecified eye: Secondary | ICD-10-CM | POA: Diagnosis not present

## 2019-03-02 DIAGNOSIS — I517 Cardiomegaly: Secondary | ICD-10-CM | POA: Diagnosis not present

## 2019-03-02 DIAGNOSIS — J40 Bronchitis, not specified as acute or chronic: Secondary | ICD-10-CM | POA: Diagnosis not present

## 2019-03-02 DIAGNOSIS — D62 Acute posthemorrhagic anemia: Secondary | ICD-10-CM | POA: Diagnosis not present

## 2019-03-02 DIAGNOSIS — S79929A Unspecified injury of unspecified thigh, initial encounter: Secondary | ICD-10-CM | POA: Diagnosis not present

## 2019-03-02 DIAGNOSIS — S7292XD Unspecified fracture of left femur, subsequent encounter for closed fracture with routine healing: Secondary | ICD-10-CM | POA: Diagnosis not present

## 2019-03-02 DIAGNOSIS — R011 Cardiac murmur, unspecified: Secondary | ICD-10-CM | POA: Diagnosis present

## 2019-03-02 DIAGNOSIS — E7800 Pure hypercholesterolemia, unspecified: Secondary | ICD-10-CM | POA: Diagnosis not present

## 2019-03-02 DIAGNOSIS — R0902 Hypoxemia: Secondary | ICD-10-CM | POA: Diagnosis not present

## 2019-03-02 DIAGNOSIS — Z91041 Radiographic dye allergy status: Secondary | ICD-10-CM | POA: Diagnosis not present

## 2019-03-02 DIAGNOSIS — Z419 Encounter for procedure for purposes other than remedying health state, unspecified: Secondary | ICD-10-CM

## 2019-03-02 DIAGNOSIS — E785 Hyperlipidemia, unspecified: Secondary | ICD-10-CM | POA: Diagnosis present

## 2019-03-02 DIAGNOSIS — S72002D Fracture of unspecified part of neck of left femur, subsequent encounter for closed fracture with routine healing: Secondary | ICD-10-CM | POA: Diagnosis not present

## 2019-03-02 DIAGNOSIS — M25552 Pain in left hip: Secondary | ICD-10-CM | POA: Diagnosis not present

## 2019-03-02 DIAGNOSIS — R52 Pain, unspecified: Secondary | ICD-10-CM | POA: Diagnosis not present

## 2019-03-02 DIAGNOSIS — R262 Difficulty in walking, not elsewhere classified: Secondary | ICD-10-CM | POA: Diagnosis not present

## 2019-03-02 DIAGNOSIS — E559 Vitamin D deficiency, unspecified: Secondary | ICD-10-CM | POA: Diagnosis not present

## 2019-03-02 DIAGNOSIS — M898X5 Other specified disorders of bone, thigh: Secondary | ICD-10-CM

## 2019-03-02 DIAGNOSIS — M25562 Pain in left knee: Secondary | ICD-10-CM | POA: Diagnosis not present

## 2019-03-02 DIAGNOSIS — R682 Dry mouth, unspecified: Secondary | ICD-10-CM | POA: Diagnosis not present

## 2019-03-02 DIAGNOSIS — M79652 Pain in left thigh: Secondary | ICD-10-CM | POA: Diagnosis not present

## 2019-03-02 DIAGNOSIS — R42 Dizziness and giddiness: Secondary | ICD-10-CM | POA: Diagnosis not present

## 2019-03-02 DIAGNOSIS — R5381 Other malaise: Secondary | ICD-10-CM | POA: Diagnosis not present

## 2019-03-02 DIAGNOSIS — S199XXA Unspecified injury of neck, initial encounter: Secondary | ICD-10-CM | POA: Diagnosis not present

## 2019-03-02 DIAGNOSIS — E569 Vitamin deficiency, unspecified: Secondary | ICD-10-CM | POA: Diagnosis not present

## 2019-03-02 DIAGNOSIS — T148XXA Other injury of unspecified body region, initial encounter: Secondary | ICD-10-CM

## 2019-03-02 HISTORY — DX: Presence of spectacles and contact lenses: Z97.3

## 2019-03-02 HISTORY — DX: Unspecified hearing loss, unspecified ear: H91.90

## 2019-03-02 LAB — CBC WITH DIFFERENTIAL/PLATELET
Abs Immature Granulocytes: 0.11 10*3/uL — ABNORMAL HIGH (ref 0.00–0.07)
Basophils Absolute: 0.1 10*3/uL (ref 0.0–0.1)
Basophils Relative: 1 %
Eosinophils Absolute: 0.3 10*3/uL (ref 0.0–0.5)
Eosinophils Relative: 3 %
HCT: 37.9 % (ref 36.0–46.0)
Hemoglobin: 12.1 g/dL (ref 12.0–15.0)
Immature Granulocytes: 1 %
Lymphocytes Relative: 13 %
Lymphs Abs: 1 10*3/uL (ref 0.7–4.0)
MCH: 30 pg (ref 26.0–34.0)
MCHC: 31.9 g/dL (ref 30.0–36.0)
MCV: 94 fL (ref 80.0–100.0)
Monocytes Absolute: 0.6 10*3/uL (ref 0.1–1.0)
Monocytes Relative: 7 %
Neutro Abs: 6.1 10*3/uL (ref 1.7–7.7)
Neutrophils Relative %: 75 %
Platelets: 237 10*3/uL (ref 150–400)
RBC: 4.03 MIL/uL (ref 3.87–5.11)
RDW: 14.8 % (ref 11.5–15.5)
WBC: 8.1 10*3/uL (ref 4.0–10.5)
nRBC: 0 % (ref 0.0–0.2)

## 2019-03-02 LAB — BASIC METABOLIC PANEL
Anion gap: 9 (ref 5–15)
BUN: 15 mg/dL (ref 8–23)
CO2: 20 mmol/L — ABNORMAL LOW (ref 22–32)
Calcium: 8.7 mg/dL — ABNORMAL LOW (ref 8.9–10.3)
Chloride: 107 mmol/L (ref 98–111)
Creatinine, Ser: 0.89 mg/dL (ref 0.44–1.00)
GFR calc Af Amer: >60 mL/min (ref >60)
GFR calc non Af Amer: 58 mL/min — ABNORMAL LOW (ref >60)
Glucose, Bld: 104 mg/dL — ABNORMAL HIGH (ref 70–99)
Potassium: 4.8 mmol/L (ref 3.5–5.1)
Sodium: 136 mmol/L (ref 135–145)

## 2019-03-02 LAB — TYPE AND SCREEN
ABO/RH(D): A POS
Antibody Screen: NEGATIVE

## 2019-03-02 MED ORDER — KETOROLAC TROMETHAMINE 0.5 % OP SOLN
1.0000 [drp] | Freq: Two times a day (BID) | OPHTHALMIC | Status: DC
  Administered 2019-03-03 – 2019-03-07 (×9): 1 [drp] via OPHTHALMIC
  Filled 2019-03-02 (×2): qty 5

## 2019-03-02 MED ORDER — PANTOPRAZOLE SODIUM 40 MG PO TBEC
40.0000 mg | DELAYED_RELEASE_TABLET | Freq: Every day | ORAL | Status: DC
  Administered 2019-03-03 – 2019-03-06 (×5): 40 mg via ORAL
  Filled 2019-03-02 (×5): qty 1

## 2019-03-02 MED ORDER — HYDROMORPHONE HCL 1 MG/ML IJ SOLN
0.5000 mg | INTRAMUSCULAR | Status: AC | PRN
  Administered 2019-03-02 (×2): 0.5 mg via INTRAVENOUS
  Filled 2019-03-02 (×2): qty 1

## 2019-03-02 MED ORDER — SODIUM CHLORIDE 0.45 % IV SOLN
INTRAVENOUS | Status: DC
  Administered 2019-03-03: via INTRAVENOUS

## 2019-03-02 MED ORDER — LABETALOL HCL 5 MG/ML IV SOLN: 5.0000 mg | INTRAVENOUS | Status: DC | PRN

## 2019-03-02 MED ORDER — ONDANSETRON HCL 4 MG/2ML IJ SOLN
4.0000 mg | Freq: Once | INTRAMUSCULAR | Status: DC
  Filled 2019-03-02 (×2): qty 2

## 2019-03-02 MED ORDER — METHOCARBAMOL 1000 MG/10ML IJ SOLN
500.0000 mg | Freq: Four times a day (QID) | INTRAVENOUS | Status: DC | PRN
  Administered 2019-03-03 (×2): 500 mg via INTRAVENOUS
  Filled 2019-03-02 (×2): qty 5
  Filled 2019-03-02: qty 500
  Filled 2019-03-02: qty 5

## 2019-03-02 MED ORDER — BISACODYL 5 MG PO TBEC: 5.0000 mg | DELAYED_RELEASE_TABLET | Freq: Every day | ORAL | Status: DC | PRN

## 2019-03-02 MED ORDER — TIMOLOL MALEATE 0.5 % OP SOLN
1.0000 [drp] | Freq: Two times a day (BID) | OPHTHALMIC | Status: DC
  Administered 2019-03-03 – 2019-03-07 (×10): 1 [drp] via OPHTHALMIC
  Filled 2019-03-02 (×3): qty 5

## 2019-03-02 MED ORDER — POLYETHYLENE GLYCOL 3350 17 G PO PACK: 17.0000 g | PACK | Freq: Every day | ORAL | Status: DC | PRN

## 2019-03-02 MED ORDER — ROSUVASTATIN CALCIUM 5 MG PO TABS
10.0000 mg | ORAL_TABLET | Freq: Every day | ORAL | Status: DC
  Administered 2019-03-04 – 2019-03-06 (×3): 10 mg via ORAL
  Filled 2019-03-02 (×3): qty 2

## 2019-03-02 MED ORDER — AMITRIPTYLINE HCL 10 MG PO TABS
10.0000 mg | ORAL_TABLET | Freq: Every day | ORAL | Status: DC
  Administered 2019-03-03 – 2019-03-06 (×5): 10 mg via ORAL
  Filled 2019-03-02 (×5): qty 1

## 2019-03-02 MED ORDER — MORPHINE SULFATE (PF) 2 MG/ML IV SOLN
1.0000 mg | INTRAVENOUS | Status: DC | PRN
  Filled 2019-03-02: qty 1

## 2019-03-02 MED ORDER — ALBUTEROL SULFATE (2.5 MG/3ML) 0.083% IN NEBU: 2.5000 mg | INHALATION_SOLUTION | RESPIRATORY_TRACT | Status: DC | PRN

## 2019-03-02 MED ORDER — HYDRALAZINE HCL 20 MG/ML IJ SOLN: 10.0000 mg | INTRAMUSCULAR | Status: DC | PRN

## 2019-03-02 MED ORDER — MOMETASONE FURO-FORMOTEROL FUM 200-5 MCG/ACT IN AERO
2.0000 | INHALATION_SPRAY | Freq: Two times a day (BID) | RESPIRATORY_TRACT | Status: DC
  Administered 2019-03-03 – 2019-03-07 (×9): 2 via RESPIRATORY_TRACT
  Filled 2019-03-02 (×2): qty 8.8

## 2019-03-02 NOTE — ED Notes (Signed)
Bed: LH73 Expected date:  Expected time:  Means of arrival:  Comments: 85yo fall/hip pain

## 2019-03-02 NOTE — ED Notes (Signed)
[facility] called. 
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
## 2019-03-02 NOTE — ED Notes (Signed)
Patient transported to CT 

## 2019-03-02 NOTE — ED Notes (Signed)
Vital signs stable. 

## 2019-03-02 NOTE — ED Provider Notes (Signed)
[facility] DEPT Provider Note   CSN: [id number] Arrival date & time: 03/02/19  1729    History   Chief Complaint Chief Complaint  Patient presents with  . Fall  . Hip Pain    HPI Tina Mercado is a 85 y.o. female      The history is provided by the patient. No language interpreter was used.  Hip Pain  This is a new problem. The current episode started 1 to 2 hours ago. The problem occurs constantly. The problem has not changed since onset.Pertinent negatives include no chest pain, no abdominal pain, no headaches and no shortness of breath. The symptoms are aggravated by bending. The symptoms are relieved by lying down. She has tried nothing for the symptoms.   85 year old female brought in by EMS with c-collar in place after mechanical fall.  Patient states that she lost her balance and fell directly onto her left hip.  She has notable bruising over her face and states that that was from a fall 2 weeks ago.  She did not hit her head or lose consciousness today.  She complains of pain in the left hip.  She is having associated spasms.  Pain is not bad when she lies still but essentially tries to move her leg she has severe pain.  She says that she fell directly onto her left Past Medical History:  Diagnosis Date  . Allergy   . Arrhythmia   . Cataract   . COPD (chronic obstructive pulmonary disease) ([place])   . GERD (gastroesophageal reflux disease)   . Glaucoma   . Hearing loss    does not wear hearing aids  . Heart murmur   . History of anemia   . History of diastolic dysfunction   . History of seasonal allergies   . Hyperlipidemia   . Hypertension   . Large hiatal hernia    see on thoracic spine xray  . Mitral regurgitation    mild to moderate  . Osteoporosis   . Reflux   . Wears glasses    readers    Patient Active Problem List   Diagnosis Date Noted  . Closed left hip fracture, initial encounter ([place]) 03/02/2019  . Prediabetes  09/28/2017  . Dizziness 09/15/2017  . Neuropathy 07/16/2017  . COPD mixed type ([place]) 04/10/2015  . Laryngopharyngeal reflux (LPR) 04/10/2015  . Chronic laryngitis 08/17/2014  . Blindness and low vision 04/13/2013  . Osteoporosis 12/23/2012  . Bronchitis 10/30/2011  . Benign hypertensive heart disease without heart failure 08/05/2011  . Pure hypercholesterolemia 08/05/2011  . Mitral regurgitation   . History of anemia   . History of diastolic dysfunction   . GERD (gastroesophageal reflux disease)   . History of seasonal allergies   . Large hiatal hernia 11/23/2004    Past Surgical History:  Procedure Laterality Date  . APPENDECTOMY    . BREAST SURGERY    . childbirth     x 1  . CORNEAL TRANSPLANT  july 2007  . EYE SURGERY    . INTRAMEDULLARY (IM) NAIL INTERTROCHANTERIC Left 03/03/2019   Procedure: INTRAMEDULLARY (IM) NAIL INTERTROCHANTRIC;  Surgeon: Shona Needles, MD;  Location: [facility];  Service: Orthopedics;  Laterality: Left;  . TONSILLECTOMY AND ADENOIDECTOMY     age 76yrs     OB History   No obstetric history on file.      Home Medications    Prior to Admission medications   Medication Sig Start Date End Date  Taking? Authorizing Provider  ADVAIR HFA 115-21 MCG/ACT inhaler TAKE 2 PUFFS BY MOUTH TWICE A DAY Patient taking differently: Inhale 2 puffs into the lungs 2 (two) times daily.  09/29/18  Yes Noralee Space, MD  amitriptyline (ELAVIL) 10 MG tablet TAKE 1 TABLET BY MOUTH EVERYDAY AT BEDTIME Patient taking differently: Take 10 mg by mouth at bedtime.  10/15/18  Yes Shawnee Knapp, MD  aspirin EC 81 MG tablet Take 81 mg by mouth at bedtime.    Yes [provider]  Cholecalciferol (VITAMIN D) 50 MCG (2000 UT) CAPS Take 2,000 Units by mouth at bedtime.   Yes [provider]  docusate sodium (COLACE) 100 MG capsule Take 100 mg by mouth at bedtime.   Yes [provider]  esomeprazole (NEXIUM) 40 MG capsule Take 1 capsule (40 mg total) by mouth  at bedtime. 02/08/19  Yes Parrett, Tammy S, NP  ketorolac (ACULAR) 0.5 % ophthalmic solution Place 1 drop into the right eye 2 (two) times daily.   Yes [provider]  rosuvastatin (CRESTOR) 10 MG tablet TAKE 1 TABLET BY MOUTH DAILY. MUST SCHEDULE APPT WITH PROVIDER TO GET FUTURE REFILLS - 2ND ATTEMPT Patient taking differently: Take 10 mg by mouth daily.  01/31/19  Yes Skeet Latch, MD  timolol (BETIMOL) 0.5 % ophthalmic solution Place 1 drop into both eyes 2 (two) times daily.   Yes [provider]  acetaminophen (TYLENOL) 325 MG tablet Take 2 tablets (650 mg total) by mouth every 6 (six) hours as needed for mild pain. 03/05/19   Regalado, Belkys A, MD  enoxaparin (LOVENOX) 30 MG/0.3ML injection Inject 0.3 mLs (30 mg total) into the skin daily. 03/07/19   Regalado, Belkys A, MD  feeding supplement, ENSURE ENLIVE, (ENSURE ENLIVE) LIQD Take 237 mLs by mouth 2 (two) times daily between meals. 03/06/19   Regalado, Belkys A, MD  Glycerin, Adult, 2.1 g SUPP Place 1 suppository rectally daily as needed for moderate constipation. 03/05/19   Regalado, Belkys A, MD  HYDROcodone-acetaminophen (NORCO/VICODIN) 5-325 MG tablet Take 1 tablet by mouth every 6 (six) hours as needed for moderate pain. 03/05/19   Regalado, Belkys A, MD  Multiple Vitamin (MULTIVITAMIN WITH MINERALS) TABS tablet Take 1 tablet by mouth daily. 03/06/19   Regalado, Belkys A, MD  polyethylene glycol (MIRALAX / GLYCOLAX) 17 g packet Take 17 g by mouth daily. 03/06/19   Regalado, Cassie Freer, MD    Family History Family History  Problem Relation Age of Onset  . Heart disease Mother   . Emphysema Father   . Emphysema Brother   . COPD Brother     Social History Social History   Tobacco Use  . Smoking status: Never Smoker  . Smokeless tobacco: Never Used  Substance Use Topics  . Alcohol use: No    Alcohol/week: 0.0 standard drinks  . Drug use: No     Allergies   Contrast media [iodinated diagnostic agents];  Antihistamines, diphenhydramine-type; Celebrex [celecoxib]; Darvocet [propoxyphene n-acetaminophen]; Demerol; Diphenhydramine hcl; Feldene [piroxicam]; Gabapentin; Meperidine; Propoxyphene; and Iodine-131   Review of Systems Review of Systems  Respiratory: Negative for shortness of breath.   Cardiovascular: Negative for chest pain.  Gastrointestinal: Negative for abdominal pain.  Neurological: Negative for headaches.     Physical Exam Updated Vital Signs BP (!) 152/62   Pulse 72   Temp 98 F (36.7 C) (Oral)   Resp 18   Ht 5\' 2"  (1.575 m)   Wt 56.7 kg   LMP  (  LMP Unknown)   SpO2 94%   BMI 22.86 kg/m   Physical Exam   ED Treatments / Results  Labs (all labs ordered are listed, but only abnormal results are displayed) Labs Reviewed  BASIC METABOLIC PANEL - Abnormal; Notable for the following components:      Result Value   CO2 20 (*)    Glucose, Bld 104 (*)    Calcium 8.7 (*)    GFR calc non Af Amer 58 (*)    All other components within normal limits  CBC WITH DIFFERENTIAL/PLATELET - Abnormal; Notable for the following components:   Abs Immature Granulocytes 0.11 (*)    All other components within normal limits  CBC - Abnormal; Notable for the following components:   Hemoglobin 11.4 (*)    HCT 34.5 (*)    All other components within normal limits  BASIC METABOLIC PANEL - Abnormal; Notable for the following components:   Sodium 132 (*)    CO2 20 (*)    Glucose, Bld 153 (*)    Creatinine, Ser 1.09 (*)    Calcium 8.5 (*)    GFR calc non Af Amer 45 (*)    GFR calc Af Amer 52 (*)    All other components within normal limits  CBC - Abnormal; Notable for the following components:   RBC 3.39 (*)    Hemoglobin 9.9 (*)    HCT 30.4 (*)    All other components within normal limits  BASIC METABOLIC PANEL - Abnormal; Notable for the following components:   Glucose, Bld 181 (*)    Creatinine, Ser 1.10 (*)    Calcium 8.5 (*)    GFR calc non Af Amer 45 (*)    GFR calc Af  Amer 52 (*)    All other components within normal limits  PREALBUMIN - Abnormal; Notable for the following components:   Prealbumin 15.6 (*)    All other components within normal limits  COMPREHENSIVE METABOLIC PANEL - Abnormal; Notable for the following components:   Glucose, Bld 126 (*)    Creatinine, Ser 1.16 (*)    Calcium 8.7 (*)    Total Protein 5.9 (*)    Albumin 2.8 (*)    AST 14 (*)    GFR calc non Af Amer 42 (*)    GFR calc Af Amer 49 (*)    All other components within normal limits  TSH - Abnormal; Notable for the following components:   TSH 6.435 (*)    All other components within normal limits  CBC - Abnormal; Notable for the following components:   RBC 3.58 (*)    Hemoglobin 11.0 (*)    HCT 31.7 (*)    All other components within normal limits  MRSA PCR SCREENING  SARS CORONAVIRUS 2 (HOSPITAL ORDER, [facility])  VITAMIN D 25 HYDROXY (VIT D DEFICIENCY, FRACTURES)  MAGNESIUM  PHOSPHORUS  PTH, INTACT AND CALCIUM  CALCIUM, IONIZED  URINALYSIS, ROUTINE W REFLEX MICROSCOPIC  T3, FREE  T4, FREE  URINALYSIS, ROUTINE W REFLEX MICROSCOPIC  CALCITRIOL (1,25 DI-OH VIT D)  TYPE AND SCREEN  ABO/RH  TYPE AND SCREEN  ABO/RH    EKG None  Radiology Dg Femur Port Min 2 Views Left  Result Date: 03/06/2019 CLINICAL DATA:  Femur pain. History of ORIF LEFT femur on 03/03/2019. EXAM: LEFT FEMUR PORTABLE 2 VIEWS COMPARISON:  03/03/2019 FINDINGS: Intramedullary nail/screws within the proximal LEFT femur again noted traversing a LEFT femoral neck fracture, in near-anatomic alignment  and position. No complicating features are noted. No new fracture or dislocation. IMPRESSION: ORIF proximal LEFT femur fracture again noted without complicating features or significant change from 03/03/2019. Electronically Signed   By: Margarette Canada M.D.   On: 03/06/2019 12:27    Procedures Procedures (including critical care time)  Medications Ordered in ED Medications   ondansetron (ZOFRAN) injection 4 mg ( Intravenous MAR Unhold 03/03/19 1808)  rosuvastatin (CRESTOR) tablet 10 mg (10 mg Oral Given 03/06/19 1805)  amitriptyline (ELAVIL) tablet 10 mg (10 mg Oral Given 03/06/19 2139)  pantoprazole (PROTONIX) EC tablet 40 mg (40 mg Oral Given 03/06/19 2138)  mometasone-formoterol (DULERA) 200-5 MCG/ACT inhaler 2 puff (2 puffs Inhalation Given 03/07/19 0836)  ketorolac (ACULAR) 0.5 % ophthalmic solution 1 drop (1 drop Right Eye Given 03/07/19 1026)  timolol (TIMOPTIC) 0.5 % ophthalmic solution 1 drop (1 drop Both Eyes Given 03/07/19 1027)  bisacodyl (DULCOLAX) EC tablet 5 mg ( Oral MAR Unhold 03/03/19 1808)  albuterol (PROVENTIL) (2.5 MG/3ML) 0.083% nebulizer solution 2.5 mg ( Inhalation MAR Unhold 03/03/19 1808)  hydrALAZINE (APRESOLINE) injection 10 mg ( Intravenous MAR Unhold 03/03/19 1808)  multivitamin with minerals tablet 1 tablet (1 tablet Oral Given 03/07/19 1025)  lactated ringers infusion ( Intravenous Anesthesia Volume Adjustment 03/03/19 1645)  acetaminophen (TYLENOL) 500 MG tablet (has no administration in time range)  morphine 2 MG/ML injection 1-2 mg (has no administration in time range)  methocarbamol (ROBAXIN) tablet 500 mg (500 mg Oral Given 03/07/19 1052)  cholecalciferol (VITAMIN D3) tablet 4,000 Units (4,000 Units Oral Given 03/07/19 1026)  acetaminophen (TYLENOL) tablet 650 mg (650 mg Oral Given 03/07/19 1222)  HYDROcodone-acetaminophen (NORCO/VICODIN) 5-325 MG per tablet 1 tablet (1 tablet Oral Given 03/07/19 1052)  feeding supplement (ENSURE ENLIVE) (ENSURE ENLIVE) liquid 237 mL (237 mLs Oral Given 03/07/19 1026)  polyethylene glycol (MIRALAX / GLYCOLAX) packet 17 g (17 g Oral Given 03/07/19 1026)  Glycerin (Adult) 2.1 g suppository 1 suppository (has no administration in time range)  enoxaparin (LOVENOX) injection 30 mg (30 mg Subcutaneous Given 03/07/19 1025)  Glycerin (Adult) 2.1 g suppository 1 suppository (1 suppository Rectal Not Given 03/07/19 1354)   HYDROmorphone (DILAUDID) injection 0.5 mg (0.5 mg Intravenous Given 03/02/19 2007)  ceFAZolin (ANCEF) IVPB 2g/100 mL premix (2 g Intravenous Given 03/03/19 1514)  acetaminophen (TYLENOL) tablet 1,000 mg (1,000 mg Oral Given 03/03/19 1429)  ceFAZolin (ANCEF) 2-4 GM/100ML-% IVPB (  Override pull for Anesthesia 03/03/19 1514)  ceFAZolin (ANCEF) IVPB 2g/100 mL premix (2 g Intravenous New Bag/Given 03/04/19 1451)     Initial Impression / Assessment and Plan / ED Course  I have reviewed the triage vital signs and the nursing notes.  Pertinent labs & imaging results that were available during my care of the patient were reviewed by me and considered in my medical decision making (see chart for details).        Patient mechanical fall and hip fracture.  Personally reviewed the patient's left hip X x-ray and CT that shows femoral neck fracture.  Patient will be admitted to the hospitalist service.  Labs show no significant abnormalities. Final Clinical Impressions(s) / ED Diagnoses   Final diagnoses:  Closed fracture of left hip, initial encounter [facility])    ED Discharge Orders         Ordered    Increase activity slowly     03/07/19 0937    Diet - low sodium heart healthy     03/07/19 0937    enoxaparin (LOVENOX) 30 MG/0.3ML  injection  Every 24 hours     03/07/19 0947    feeding supplement, ENSURE ENLIVE, (ENSURE ENLIVE) LIQD  2 times daily between meals     03/05/19 1406    Multiple Vitamin (MULTIVITAMIN WITH MINERALS) TABS tablet  Daily     03/05/19 1406    polyethylene glycol (MIRALAX / GLYCOLAX) 17 g packet  Daily     03/05/19 1406    Glycerin, Adult, 2.1 g SUPP  Daily PRN     03/05/19 1406    HYDROcodone-acetaminophen (NORCO/VICODIN) 5-325 MG tablet  Every 6 hours PRN     03/05/19 1406    acetaminophen (TYLENOL) 325 MG tablet  Every 6 hours PRN     03/05/19 1406    Increase activity slowly     03/05/19 1406    Diet - low sodium heart healthy     03/05/19 1406           Margarita Mail, PA-C 03/07/19 1457    Drenda Freeze, MD 03/10/19 1059

## 2019-03-03 ENCOUNTER — Encounter (HOSPITAL_COMMUNITY): Payer: Self-pay | Admitting: *Deleted

## 2019-03-03 ENCOUNTER — Inpatient Hospital Stay (HOSPITAL_COMMUNITY): Payer: Medicare Other | Admitting: Certified Registered"

## 2019-03-03 ENCOUNTER — Inpatient Hospital Stay (HOSPITAL_COMMUNITY): Payer: Medicare Other

## 2019-03-03 ENCOUNTER — Encounter (HOSPITAL_COMMUNITY)
Admission: EM | Disposition: A | Discharge status: Skilled Nursing Facility | Payer: Self-pay | Source: Home / Self Care | Attending: Internal Medicine

## 2019-03-03 HISTORY — PX: INTRAMEDULLARY (IM) NAIL INTERTROCHANTERIC: SHX5875

## 2019-03-03 LAB — ABO/RH
ABO/RH(D): A POS
ABO/RH(D): A POS

## 2019-03-03 LAB — TYPE AND SCREEN
ABO/RH(D): A POS
Antibody Screen: NEGATIVE

## 2019-03-03 LAB — SARS CORONAVIRUS 2 BY RT PCR (HOSPITAL ORDER, PERFORMED IN ~~LOC~~ HOSPITAL LAB): SARS Coronavirus 2: NEGATIVE

## 2019-03-03 LAB — MRSA PCR SCREENING: MRSA by PCR: NEGATIVE

## 2019-03-03 SURGERY — FIXATION, FRACTURE, INTERTROCHANTERIC, WITH INTRAMEDULLARY ROD
Anesthesia: General | Site: Hip | Laterality: Left

## 2019-03-03 MED ORDER — CHLORHEXIDINE GLUCONATE 4 % EX LIQD: 60.0000 mL | Freq: Once | CUTANEOUS | Status: DC

## 2019-03-03 MED ORDER — FENTANYL CITRATE (PF) 100 MCG/2ML IJ SOLN: 25.0000 ug | INTRAMUSCULAR | Status: DC | PRN

## 2019-03-03 MED ORDER — SODIUM CHLORIDE 0.9 % IV SOLN
INTRAVENOUS | Status: DC | PRN
  Administered 2019-03-03: 25 ug/min via INTRAVENOUS

## 2019-03-03 MED ORDER — CEFAZOLIN SODIUM-DEXTROSE 2-4 GM/100ML-% IV SOLN
2.0000 g | INTRAVENOUS | Status: AC
  Administered 2019-03-03: 2 g via INTRAVENOUS

## 2019-03-03 MED ORDER — ONDANSETRON HCL 4 MG/2ML IJ SOLN: 4.0000 mg | Freq: Four times a day (QID) | INTRAMUSCULAR | Status: DC | PRN

## 2019-03-03 MED ORDER — ENSURE ENLIVE PO LIQD: 237.0000 mL | ORAL | Status: DC

## 2019-03-03 MED ORDER — ONDANSETRON HCL 4 MG/2ML IJ SOLN
INTRAMUSCULAR | Status: AC
  Filled 2019-03-03: qty 2

## 2019-03-03 MED ORDER — METHOCARBAMOL 500 MG PO TABS
500.0000 mg | ORAL_TABLET | Freq: Four times a day (QID) | ORAL | Status: DC | PRN
  Administered 2019-03-04 – 2019-03-07 (×6): 500 mg via ORAL
  Filled 2019-03-03 (×7): qty 1

## 2019-03-03 MED ORDER — CEFAZOLIN SODIUM-DEXTROSE 2-4 GM/100ML-% IV SOLN
2.0000 g | Freq: Three times a day (TID) | INTRAVENOUS | Status: AC
  Administered 2019-03-03 – 2019-03-04 (×3): 2 g via INTRAVENOUS
  Filled 2019-03-03 (×4): qty 100

## 2019-03-03 MED ORDER — VITAMIN D 25 MCG (1000 UNIT) PO TABS
4000.0000 [IU] | ORAL_TABLET | Freq: Every day | ORAL | Status: DC
  Administered 2019-03-03 – 2019-03-07 (×5): 4000 [IU] via ORAL
  Filled 2019-03-03 (×5): qty 4

## 2019-03-03 MED ORDER — VANCOMYCIN HCL 1000 MG IV SOLR
INTRAVENOUS | Status: AC
  Filled 2019-03-03: qty 1000

## 2019-03-03 MED ORDER — CEFAZOLIN SODIUM-DEXTROSE 2-4 GM/100ML-% IV SOLN
INTRAVENOUS | Status: AC
  Filled 2019-03-03: qty 100

## 2019-03-03 MED ORDER — LACTATED RINGERS IV SOLN
INTRAVENOUS | Status: DC
  Administered 2019-03-03: 14:00:00 via INTRAVENOUS

## 2019-03-03 MED ORDER — FENTANYL CITRATE (PF) 250 MCG/5ML IJ SOLN
INTRAMUSCULAR | Status: AC
  Filled 2019-03-03: qty 5

## 2019-03-03 MED ORDER — LIDOCAINE 2% (20 MG/ML) 5 ML SYRINGE
INTRAMUSCULAR | Status: DC | PRN
  Administered 2019-03-03: 60 mg via INTRAVENOUS

## 2019-03-03 MED ORDER — MORPHINE SULFATE (PF) 2 MG/ML IV SOLN: 1.0000 mg | INTRAVENOUS | Status: DC | PRN

## 2019-03-03 MED ORDER — ACETAMINOPHEN 500 MG PO TABS
1000.0000 mg | ORAL_TABLET | Freq: Once | ORAL | Status: AC
  Administered 2019-03-03: 1000 mg via ORAL

## 2019-03-03 MED ORDER — TRAMADOL HCL 50 MG PO TABS: 50.0000 mg | ORAL_TABLET | Freq: Four times a day (QID) | ORAL | Status: DC | PRN

## 2019-03-03 MED ORDER — ENOXAPARIN SODIUM 40 MG/0.4ML ~~LOC~~ SOLN
40.0000 mg | SUBCUTANEOUS | Status: DC
  Administered 2019-03-04: 10:00:00 40 mg via SUBCUTANEOUS
  Filled 2019-03-03: qty 0.4

## 2019-03-03 MED ORDER — PHENYLEPHRINE 40 MCG/ML (10ML) SYRINGE FOR IV PUSH (FOR BLOOD PRESSURE SUPPORT)
PREFILLED_SYRINGE | INTRAVENOUS | Status: DC | PRN
  Administered 2019-03-03 (×2): 200 ug via INTRAVENOUS

## 2019-03-03 MED ORDER — SUCCINYLCHOLINE CHLORIDE 200 MG/10ML IV SOSY
PREFILLED_SYRINGE | INTRAVENOUS | Status: DC | PRN
  Administered 2019-03-03: 80 mg via INTRAVENOUS

## 2019-03-03 MED ORDER — PROPOFOL 10 MG/ML IV BOLUS
INTRAVENOUS | Status: AC
  Filled 2019-03-03: qty 20

## 2019-03-03 MED ORDER — FENTANYL CITRATE (PF) 250 MCG/5ML IJ SOLN
INTRAMUSCULAR | Status: DC | PRN
  Administered 2019-03-03 (×3): 25 ug via INTRAVENOUS
  Administered 2019-03-03: 50 ug via INTRAVENOUS

## 2019-03-03 MED ORDER — OXYCODONE HCL 5 MG/5ML PO SOLN: 5.0000 mg | Freq: Once | ORAL | Status: DC | PRN

## 2019-03-03 MED ORDER — VANCOMYCIN HCL 1000 MG IV SOLR
INTRAVENOUS | Status: DC | PRN
  Administered 2019-03-03: 1000 mg

## 2019-03-03 MED ORDER — ADULT MULTIVITAMIN W/MINERALS CH
1.0000 | ORAL_TABLET | Freq: Every day | ORAL | Status: DC
  Administered 2019-03-04 – 2019-03-07 (×4): 1 via ORAL
  Filled 2019-03-03 (×4): qty 1

## 2019-03-03 MED ORDER — SUGAMMADEX SODIUM 200 MG/2ML IV SOLN
INTRAVENOUS | Status: DC | PRN
  Administered 2019-03-03: 125 mg via INTRAVENOUS

## 2019-03-03 MED ORDER — ACETAMINOPHEN 500 MG PO TABS
ORAL_TABLET | ORAL | Status: AC
  Filled 2019-03-03: qty 2

## 2019-03-03 MED ORDER — OXYCODONE HCL 5 MG PO TABS: 5.0000 mg | ORAL_TABLET | Freq: Once | ORAL | Status: DC | PRN

## 2019-03-03 MED ORDER — ONDANSETRON HCL 4 MG/2ML IJ SOLN
INTRAMUSCULAR | Status: DC | PRN
  Administered 2019-03-03: 4 mg via INTRAVENOUS

## 2019-03-03 MED ORDER — 0.9 % SODIUM CHLORIDE (POUR BTL) OPTIME
TOPICAL | Status: DC | PRN
  Administered 2019-03-03: 1000 mL

## 2019-03-03 MED ORDER — DEXAMETHASONE SODIUM PHOSPHATE 10 MG/ML IJ SOLN
INTRAMUSCULAR | Status: AC
  Filled 2019-03-03: qty 1

## 2019-03-03 MED ORDER — ROCURONIUM BROMIDE 10 MG/ML (PF) SYRINGE
PREFILLED_SYRINGE | INTRAVENOUS | Status: DC | PRN
  Administered 2019-03-03: 25 mg via INTRAVENOUS

## 2019-03-03 MED ORDER — PROPOFOL 10 MG/ML IV BOLUS
INTRAVENOUS | Status: DC | PRN
  Administered 2019-03-03: 70 mg via INTRAVENOUS

## 2019-03-03 MED ORDER — DEXAMETHASONE SODIUM PHOSPHATE 10 MG/ML IJ SOLN
INTRAMUSCULAR | Status: DC | PRN
  Administered 2019-03-03: 10 mg via INTRAVENOUS

## 2019-03-03 SURGICAL SUPPLY — 48 items
BIT DRILL CANN LG 4.3MM (BIT) ×1 IMPLANT
BRUSH SCRUB SURG 4.25 DISP (MISCELLANEOUS) ×6 IMPLANT
CHLORAPREP W/TINT 26ML (MISCELLANEOUS) ×3 IMPLANT
COVER PERINEAL POST (MISCELLANEOUS) ×3 IMPLANT
COVER SURGICAL LIGHT HANDLE (MISCELLANEOUS) ×3 IMPLANT
COVER WAND RF STERILE (DRAPES) ×3 IMPLANT
DERMABOND ADVANCED (GAUZE/BANDAGES/DRESSINGS) ×4
DERMABOND ADVANCED .7 DNX12 (GAUZE/BANDAGES/DRESSINGS) ×2 IMPLANT
DRAPE HALF SHEET 40X57 (DRAPES) ×6 IMPLANT
DRAPE IMP U-DRAPE 54X76 (DRAPES) ×9 IMPLANT
DRAPE INCISE IOBAN 66X45 STRL (DRAPES) ×3 IMPLANT
DRAPE STERI IOBAN 125X83 (DRAPES) ×3 IMPLANT
DRAPE SURG 17X23 STRL (DRAPES) ×6 IMPLANT
DRAPE U-SHAPE 47X51 STRL (DRAPES) ×3 IMPLANT
DRILL BIT CANN LG 4.3MM (BIT) ×3
DRSG MEPILEX BORDER 4X4 (GAUZE/BANDAGES/DRESSINGS) ×9 IMPLANT
DRSG MEPILEX BORDER 4X8 (GAUZE/BANDAGES/DRESSINGS) IMPLANT
ELECT REM PT RETURN 9FT ADLT (ELECTROSURGICAL) ×3
ELECTRODE REM PT RTRN 9FT ADLT (ELECTROSURGICAL) ×1 IMPLANT
GLOVE BIO SURGEON STRL SZ 6.5 (GLOVE) ×6 IMPLANT
GLOVE BIO SURGEON STRL SZ7.5 (GLOVE) ×12 IMPLANT
GLOVE BIO SURGEONS STRL SZ 6.5 (GLOVE) ×3
GLOVE BIOGEL PI IND STRL 6.5 (GLOVE) ×1 IMPLANT
GLOVE BIOGEL PI IND STRL 7.5 (GLOVE) ×1 IMPLANT
GLOVE BIOGEL PI INDICATOR 6.5 (GLOVE) ×2
GLOVE BIOGEL PI INDICATOR 7.5 (GLOVE) ×2
GOWN STRL REUS W/ TWL LRG LVL3 (GOWN DISPOSABLE) ×1 IMPLANT
GOWN STRL REUS W/TWL LRG LVL3 (GOWN DISPOSABLE) ×2
GUIDEPIN 3.2X17.5 THRD DISP (PIN) ×9 IMPLANT
KIT BASIN OR (CUSTOM PROCEDURE TRAY) ×3 IMPLANT
KIT TURNOVER KIT B (KITS) ×3 IMPLANT
LINER BOOT UNIVERSAL DISP (MISCELLANEOUS) IMPLANT
MANIFOLD NEPTUNE II (INSTRUMENTS) ×3 IMPLANT
NAIL HIP FRACT 130D 11X180 (Screw) ×3 IMPLANT
NS IRRIG 1000ML POUR BTL (IV SOLUTION) ×3 IMPLANT
PACK GENERAL/GYN (CUSTOM PROCEDURE TRAY) ×3 IMPLANT
PAD ARMBOARD 7.5X6 YLW CONV (MISCELLANEOUS) ×6 IMPLANT
SCREW ANTI ROTATION 80MM (Screw) ×3 IMPLANT
SCREW BONE CORTICAL 5.0X38 (Screw) ×3 IMPLANT
SCREW DRILL BIT ANIT ROTATION (BIT) ×3 IMPLANT
SCREW LAG HIP NAIL 10.5X95 (Screw) ×3 IMPLANT
SUT MNCRL AB 3-0 PS2 18 (SUTURE) ×3 IMPLANT
SUT VIC AB 0 CT1 27 (SUTURE) ×2
SUT VIC AB 0 CT1 27XBRD ANBCTR (SUTURE) ×1 IMPLANT
SUT VIC AB 2-0 CT1 27 (SUTURE) ×2
SUT VIC AB 2-0 CT1 TAPERPNT 27 (SUTURE) ×1 IMPLANT
TOWEL OR 17X26 10 PK STRL BLUE (TOWEL DISPOSABLE) ×6 IMPLANT
WATER STERILE IRR 1000ML POUR (IV SOLUTION) ×3 IMPLANT
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
## 2019-03-03 NOTE — Anesthesia Postprocedure Evaluation (Signed)
Anesthesia Post Note  Patient: Tina Mercado  Procedure(s) Performed: INTRAMEDULLARY (IM) NAIL INTERTROCHANTRIC (Left Hip)     Patient location during evaluation: PACU Anesthesia Type: General Level of consciousness: awake and alert Pain management: pain level controlled Vital Signs Assessment: post-procedure vital signs reviewed and stable Respiratory status: spontaneous breathing, nonlabored ventilation, respiratory function stable and patient connected to nasal cannula oxygen Cardiovascular status: blood pressure returned to baseline and stable Postop Assessment: no apparent nausea or vomiting Anesthetic complications: no    Last Vitals:  Vitals:   03/03/19 1747 03/03/19 1759  BP: (!) 161/62   Pulse: 68   Resp: 10   Temp:  36.4 C  SpO2: 100%     Last Pain:  Vitals:   03/03/19 1747  TempSrc:   PainSc: 0-No pain                 Brytney Somes,W. EDMOND
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
## 2019-03-03 NOTE — Progress Notes (Signed)
Initial Nutrition Assessment  RD working remotely.  DOCUMENTATION CODES:   Not applicable  INTERVENTION:   -Once diet is advanced, add:  -Ensure Enlive po daily, each supplement provides 350 kcal and 20 grams of protein -MVI with minerals daily  NUTRITION DIAGNOSIS:   Increased nutrient needs related to post-op healing as evidenced by estimated needs.  GOAL:   Patient will meet greater than or equal to 90% of their needs  MONITOR:   Supplement acceptance, PO intake, Diet advancement, Labs, Weight trends, Skin, I & O's  REASON FOR ASSESSMENT:   Consult Hip fracture protocol  ASSESSMENT:   Tina Mercado is a 85 y.o. female with medical history significant for hypertension, GERD, COPD, and balance difficulties with recurrent falls, now presenting to the emergency department with left hip pain after a fall at home.  Patient reports that she was in her usual state of health and had gotten some food out of the refrigerator, was carrying it with both hands and so she was not using her cane, denies feeling lightheaded or presyncopal, but also does not believe that she tripped, but in any case fell onto her left side, hitting her head on the door jam on her way down.  She denies losing consciousness.  She is experiencing pain at her left hip and thigh.  She denies any chest pain with her usual activities, has had a mild cough that she attributes to allergies, but no increase in her chronic dyspnea and no fevers, chills, travel, or contacts with anyone that has COVID-19.  Pt admitted with lt hip fracture.   Per orthopedics notes, pt scheduled for IM nailing today.   Reviewed I/O's: 0 ml x 24 hours  Attempted to speak with pt via phone, however, unable to reach. RD unable to obtain further nutrition-related history at this time.   Most recent wt documented at 56.7 kg on 02/14/19, which was used to calculate needs. Using most recent wt, pt has experienced a 3.1% wt loss over the  past 4 months, which is not significant for time frame.   Pt with increased nutrition-related needs due to post-operative healing and would benefit from addition of nutritional supplements.   Labs reviewed.   NUTRITION - FOCUSED PHYSICAL EXAM:    Most Recent Value  Orbital Region  Unable to assess  Upper Arm Region  Unable to assess  Thoracic and Lumbar Region  Unable to assess  Buccal Region  Unable to assess  Temple Region  Unable to assess  Clavicle Bone Region  Unable to assess  Clavicle and Acromion Bone Region  Unable to assess  Scapular Bone Region  Unable to assess  Dorsal Hand  Unable to assess  Patellar Region  Unable to assess  Anterior Thigh Region  Unable to assess  Posterior Calf Region  Unable to assess  Edema (RD Assessment)  Unable to assess  Hair  Unable to assess  Eyes  Unable to assess  Mouth  Unable to assess  Skin  Unable to assess  Nails  Unable to assess       Diet Order:   Diet Order            Diet NPO time specified Except for: Ice Chips  Diet effective midnight              EDUCATION NEEDS:   No education needs have been identified at this time  Skin:  Skin Assessment: Reviewed RN Assessment  Last BM:  Unknown  Height:  Ht Readings from Last 1 Encounters:  03/03/19 5\' 2"  (1.575 m)    Weight:   Wt Readings from Last 1 Encounters:  03/03/19 56.7 kg    Ideal Body Weight:  50 kg  BMI:  Body mass index is 22.86 kg/m.  Estimated Nutritional Needs:   Kcal:  1500-1700  Protein:  70-85 grams  Fluid:  1.5-1.7 L    Jerry Haugen A. Jimmye Norman, RD, LDN, [place] Registered Dietitian II Certified Diabetes Care and Education Specialist Pager: [phone] After hours Pager: [phone]

## 2019-03-03 NOTE — Op Note (Signed)
Orthopaedic Surgery Operative Note (CSN: [id number] ) Date of Surgery: 03/03/2019  Admit Date: 03/02/2019   Diagnoses: Pre-Op Diagnoses: Left femoral neck/intertrochanteric femur fracture   Post-Op Diagnosis: Same  Procedures: CPT 27245-Cephalomedullary nailing of left intertrochanteric femur fracture  Surgeons : Primary: Shona Needles, MD  Assistant: Patrecia Pace, PA-C  Location: OR8   Anesthesia:General   Antibiotics: Ancef 2g preop   Tourniquet time:None  Estimated Blood MGNO:03 mL  Complications:None  Specimens:None   Implants: Implant Name Type Inv. Item Serial No. Manufacturer Lot No. LRB No. Used  NAIL HIP FRACT 130D 11X180 - [id number] Screw NAIL HIP FRACT 130D 11X180  [employer](ORTH,TRAU,BIO,SG) [id number] Left 1  SCREW ANTI ROTATION 80MM - [id number] Screw SCREW ANTI ROTATION 80MM  [employer](ORTH,TRAU,BIO,SG) [id number] Left 1  SCREW ANTI ROTATION 80MM - [id number] Screw SCREW ANTI ROTATION 80MM  [employer](ORTH,TRAU,BIO,SG) [id number] Left 1  SCREW BONE CORTICAL 5.0X38 - [id number] Screw SCREW BONE CORTICAL 5.0X38  [employer](ORTH,TRAU,BIO,SG) [id number] Left 1     Indications for Surgery: 85 year old female with a history of COPD as well as hypertension with heart murmur who has a minimally displaced left femoral neck fracture with extension into the intertrochanteric region of the femur.  After discussion with the patient as well as multiple surgeons in town it was felt that cephalo-medullary nailing would be the best option with plans for conversion to total hip arthroplasty if there is cut out or failure.  It was felt that with the intertrochanteric extension that a more robust fixation would be needed.  Risks and benefits were discussed with the patient including risk of bleeding, infection, malunion, nonunion, hardware failure including cut out, progression of hip arthritis, nerve and blood vessel injury, DVT, heart attack and even stroke.  The patient and her  daughter agreed to proceed with surgery and consent was obtained.  Operative Findings: Closed reduction with a cephalo-medullary nailing of left femoral neck/intertrochanteric femur fracture using [employer] affixes nail 11 x 180 mm with a 95 mm lag screw and a 80 mm anti-rotational screw.  Procedure: The patient was identified in the preoperative holding area. Consent was confirmed with the patient and their family and all questions were answered. The operative extremity was marked after confirmation with the patient and they were then brought back to the operating room by our anesthesia colleagues. The patient was placed under general anesthesia and then carefully transferred over to a fracture table. The feet were secured into a traction boot and well padded. A post was placed in the groin and traction was pulled on the operative leg. The contralateral leg was positioned out of the way of fluoroscopy and secured. Fluoroscopic images were obtained and traction and manipulation was performed to reduce the fracture. Once adequate reduction was performed then the operative extremity was prepped and draped in sterile fashion. Preincision timeout was performed to verify the patient, the procedure and the extremity. Preoperative antibiotics were dosed.  A small incision was made proximal to the greater trochanter. A curved Mayo scissors was used to spread down to the greater trochanter in line with the abductor musculature. A threaded guidepin was positioned at an appropriate starting point on the AP and lateral views. It was advanced in the femur past the lesser trochanter. A entry reamer with soft tissue protector was then used to enter the canal. An 11 mm short nail was placed into the canal and seated down to an appropriate position radiographically. The targeting arm for the lag screw was attached. A  percutaneous incision was made for the guide for the helical blade. A threaded guidepin was placed into  the femoral neck and head and fluoroscopy was used to confirm adequate placement with an acceptable tip-apex distance.  Another guidewire was used for the anti-rotational screw location.  I then drilled and placed the anti-rotational screw.  80 mm was the length of the screw.  I got good purchase with this fixation.  The threads to cross the fracture site.  I then measured and drilled the path for the lag screw.  I then placed a 95 mm lag screw and obtained excellent purchase.  I then placed a distal interlocking screw at the end of the nail.  Fluoroscopic images were obtained using a around the world view to confirm that the screws were not intra-articular.  Final fluoroscopic images were obtained.  The incisions were copiously irrigated. The skin was closed with 2-0 vicryl, 3-0 monocryl and sealed with dermabond. The incisions were dressing with Mepilex dressings. The patient was carefully transferred to the regular floor bed and was taken to PACU in stable condition.   Post Op Plan/Instructions: The patient will be weightbearing as tolerated to the left lower extremity.  She will receive postoperative Ancef.  She will receive Lovenox for DVT prophylaxis.  She will mobilize with physical therapy.  She will likely need skilled nursing facility.  I was present and performed the entire surgery.  Patrecia Pace, PA-C did assist me throughout the case. An assistant was necessary given the difficulty in approach, maintenance of reduction and ability to instrument the fracture.   Katha Hamming, MD [facility]

## 2019-03-03 NOTE — Anesthesia Procedure Notes (Signed)
Procedure Name: Intubation Date/Time: 03/03/2019 3:09 PM Performed by: Shirlyn Goltz, CRNA Pre-anesthesia Checklist: Patient identified, Emergency Drugs available, Suction available and Patient being monitored Patient Re-evaluated:Patient Re-evaluated prior to induction Oxygen Delivery Method: Circle system utilized Preoxygenation: Pre-oxygenation with 100% oxygen Induction Type: IV induction and Rapid sequence Laryngoscope Size: Mac and 3 Grade View: Grade III Tube type: Oral Tube size: 7.0 mm Number of attempts: 1 Airway Equipment and Method: Stylet Placement Confirmation: ETT inserted through vocal cords under direct vision,  positive ETCO2 and breath sounds checked- equal and bilateral Secured at: 22 cm Tube secured with: Tape Dental Injury: Teeth and Oropharynx as per pre-operative assessment  Comments: Intubation by DR. fitzgerald

## 2019-03-03 NOTE — Social Work (Signed)
CSW acknowledging consult for SNF placement. Will follow for therapy recommendations.   Nevaeh Korte, MSW, LCSWA [facility] Clinical Social Work [phone]   

## 2019-03-03 NOTE — Anesthesia Preprocedure Evaluation (Addendum)
Anesthesia Evaluation  Patient identified by MRN, date of birth, ID band Patient awake    Reviewed: Allergy & Precautions, H&P , NPO status , Patient's Chart, lab work & pertinent test results  Airway Mallampati: II  TM Distance: >3 FB Neck ROM: full    Dental  (+) Teeth Intact, Dental Advisory Given   Pulmonary COPD,  COPD inhaler,    breath sounds clear to auscultation       Cardiovascular hypertension, Pt. on medications + Valvular Problems/Murmurs MR  Rhythm:regular Rate:Normal     Neuro/Psych    GI/Hepatic hiatal hernia, GERD  Medicated and Controlled,  Endo/Other    Renal/GU      Musculoskeletal   Abdominal   Peds  Hematology   Anesthesia Other Findings   Reproductive/Obstetrics                           Anesthesia Physical Anesthesia Plan  ASA: II  Anesthesia Plan: General   Post-op Pain Management:    Induction: Intravenous  PONV Risk Score and Plan: 3 and Ondansetron, Dexamethasone and Treatment may vary due to age or medical condition  Airway Management Planned: Oral ETT  Additional Equipment:   Intra-op Plan:   Post-operative Plan: Extubation in OR  Informed Consent: I have reviewed the patients History and Physical, chart, labs and discussed the procedure including the risks, benefits and alternatives for the proposed anesthesia with the patient or authorized representative who has indicated his/her understanding and acceptance.       Plan Discussed with: CRNA, Anesthesiologist and Surgeon  Anesthesia Plan Comments:         Anesthesia Quick Evaluation

## 2019-03-03 NOTE — Progress Notes (Signed)
PROGRESS NOTE    Tina Mercado  WUJ:[id number] DOB: Oct 24, 1929 DOA: 03/02/2019 PCP: Shawnee Knapp, MD    Brief Narrative; 85 year old with past medical history significant for hypertension, GERD, COPD, balance difficulty with recurrent falls who presented to the emergency department with left hip pain after a fall at home.  Patient denies lightheadedness or presyncopal episode prior to falling.  No loss of consciousness.  She denies chest pain or shortness of breath.  Patient admitted with left hip fracture.   Assessment & Plan:   Principal Problem:   Closed left hip fracture, initial encounter [facility]) Active Problems:   Benign hypertensive heart disease without heart failure   COPD mixed type ([place])   Large hiatal hernia  1-Left hip fracture: Patient presents after a fall at home. CT with left femoral neck fracture. Plan for surgery today. Risk for surgery estimated to be 1% for perioperative MI or cardiac arrest. Avoid the Dilaudid.  Will reduce also morphine dose  2-COPD: Stable continue with inhalers. She is currently on 3 L of oxygen, she was placed on oxygen last night after she received Dilaudid.  Oxygen supplementation had decreased to 1 L and her oxygen saturation remained 97%. Taper Oxygen off as needed.  3-Hypertension: PRN hydralazine.  Balance difficulty frequent cough PT to evaluate after surgery.  Large Hiatal hernia; continue with PPI  Acute hypoxic respiratory failure: Related to medication.  Discontinue Dilaudid. Oxygen improved now on 1 L down from 3  Nutrition Problem: Increased nutrient needs Etiology: post-op healing    Signs/Symptoms: estimated needs    Interventions: Ensure Enlive (each supplement provides 350kcal and 20 grams of protein), MVI  Estimated body mass index is 22.86 kg/m as calculated from the following:   Height as of this encounter: 5\' 2"  (1.575 m).   Weight as of this encounter: 56.7 kg.   DVT prophylaxis: SCDs Code  Status: Full code Family Communication: Care discussed with patient Disposition Plan: Remain in the hospital for surgery  Consultants:   Orthopedic   Procedures:  None   Antimicrobials:   None   Subjective: She denies chest pain, shortness of breath.  She is having a lot of pain she is not even able to move  Objective: Vitals:   03/02/19 2007 03/02/19 2331 03/03/19 0356 03/03/19 1044  BP: (!) 191/63 (!) 164/73 (!) 141/59   Pulse: 72 85 75   Resp: 18 16 18    Temp:  98.1 F (36.7 C) 98.6 F (37 C)   TempSrc:  Oral Oral   SpO2: 97% 100% 100%   Weight:    56.7 kg  Height:    5\' 2"  (1.575 m)    Intake/Output Summary (Last 24 hours) at 03/03/2019 1241 Last data filed at 03/03/2019 1200 Gross per 24 hour  Intake 0 ml  Output 0 ml  Net 0 ml   Filed Weights   03/03/19 1044  Weight: 56.7 kg    Examination:  General exam: Appears calm and comfortable  Respiratory system: Clear to auscultation. Respiratory effort normal. Cardiovascular system: S1 & S2 heard, RRR. No JVD, murmurs, rubs, gallops or clicks. No pedal edema. Gastrointestinal system: Abdomen is nondistended, soft and nontender. No organomegaly or masses felt. Normal bowel sounds heard. Central nervous system: Alert and oriented. No focal neurological deficits. Extremities: shorter  left lower extremity Skin: No rashes, lesions or ulcers Psychiatry: Judgement and insight appear normal. Mood & affect appropriate.     Data Reviewed: I have personally reviewed following labs and  imaging studies  CBC: Recent Labs  Lab 03/02/19 1835  WBC 8.1  NEUTROABS 6.1  HGB 12.1  HCT 37.9  MCV 94.0  PLT 294   Basic Metabolic Panel: Recent Labs  Lab 03/02/19 1835  NA 136  K 4.8  CL 107  CO2 20*  GLUCOSE 104*  BUN 15  CREATININE 0.89  CALCIUM 8.7*   GFR: Estimated Creatinine Clearance: 34.6 mL/min (by C-G formula based on SCr of 0.89 mg/dL). Liver Function Tests: No results for input(s): AST, ALT,  ALKPHOS, BILITOT, PROT, ALBUMIN in the last 168 hours. No results for input(s): LIPASE, AMYLASE in the last 168 hours. No results for input(s): AMMONIA in the last 168 hours. Coagulation Profile: No results for input(s): INR, PROTIME in the last 168 hours. Cardiac Enzymes: No results for input(s): CKTOTAL, CKMB, CKMBINDEX, TROPONINI in the last 168 hours. BNP (last 3 results) No results for input(s): PROBNP in the last 8760 hours. HbA1C: No results for input(s): HGBA1C in the last 72 hours. CBG: No results for input(s): GLUCAP in the last 168 hours. Lipid Profile: No results for input(s): CHOL, HDL, LDLCALC, TRIG, CHOLHDL, LDLDIRECT in the last 72 hours. Thyroid Function Tests: No results for input(s): TSH, T4TOTAL, FREET4, T3FREE, THYROIDAB in the last 72 hours. Anemia Panel: No results for input(s): VITAMINB12, FOLATE, FERRITIN, TIBC, IRON, RETICCTPCT in the last 72 hours. Sepsis Labs: No results for input(s): PROCALCITON, LATICACIDVEN in the last 168 hours.  Recent Results (from the past 240 hour(s))  MRSA PCR Screening     Status: None   Collection Time: 03/03/19 12:34 AM  Result Value Ref Range Status   MRSA by PCR NEGATIVE NEGATIVE Final    Comment:        The GeneXpert MRSA Assay (FDA approved for NASAL specimens only), is one component of a comprehensive MRSA colonization surveillance program. It is not intended to diagnose MRSA infection nor to guide or monitor treatment for MRSA infections. Performed at [facility], [place] [address] [place], [place] [address]   SARS Coronavirus 2 (CEPHEID - Performed in [place] hospital lab), Hosp Order     Status: None   Collection Time: 03/03/19  1:20 AM  Result Value Ref Range Status   SARS Coronavirus 2 NEGATIVE NEGATIVE Final    Comment: (NOTE) If result is NEGATIVE SARS-CoV-2 target nucleic acids are NOT DETECTED. The SARS-CoV-2 RNA is generally detectable in upper and lower  respiratory specimens during the  acute phase of infection. The lowest  concentration of SARS-CoV-2 viral copies this assay can detect is 250  copies / mL. A negative result does not preclude SARS-CoV-2 infection  and should not be used as the sole basis for treatment or other  patient management decisions.  A negative result may occur with  improper specimen collection / handling, submission of specimen other  than nasopharyngeal swab, presence of viral mutation(s) within the  areas targeted by this assay, and inadequate number of viral copies  (<250 copies / mL). A negative result must be combined with clinical  observations, patient history, and epidemiological information. If result is POSITIVE SARS-CoV-2 target nucleic acids are DETECTED. The SARS-CoV-2 RNA is generally detectable in upper and lower  respiratory specimens dur ing the acute phase of infection.  Positive  results are indicative of active infection with SARS-CoV-2.  Clinical  correlation with patient history and other diagnostic information is  necessary to determine patient infection status.  Positive results do  not rule out bacterial infection  or co-infection with other viruses. If result is PRESUMPTIVE POSTIVE SARS-CoV-2 nucleic acids MAY BE PRESENT.   A presumptive positive result was obtained on the submitted specimen  and confirmed on repeat testing.  While 2019 novel coronavirus  (SARS-CoV-2) nucleic acids may be present in the submitted sample  additional confirmatory testing may be necessary for epidemiological  and / or clinical management purposes  to differentiate between  SARS-CoV-2 and other Sarbecovirus currently known to infect humans.  If clinically indicated additional testing with an alternate test  methodology [phone]) is advised. The SARS-CoV-2 RNA is generally  detectable in upper and lower respiratory sp ecimens during the acute  phase of infection. The expected result is Negative. Fact Sheet for Patients:   StrictlyIdeas.no Fact Sheet for Healthcare Providers: BankingDealers.co.za This test is not yet approved or cleared by the [place] FDA and has been authorized for detection and/or diagnosis of SARS-CoV-2 by FDA under an Emergency Use Authorization (EUA).  This EUA will remain in effect (meaning this test can be used) for the duration of the COVID-19 declaration under Section 564(b)(1) of the Act, 21 U.S.C. section 360bbb-3(b)(1), unless the authorization is terminated or revoked sooner. Performed at [facility], [place] [address] [place], [place] [address]          Radiology Studies: Dg Chest 1 View  Result Date: 03/02/2019 CLINICAL DATA:  Preoperative respiratory evaluation for hip fracture. EXAM: CHEST  1 VIEW COMPARISON:  02/14/2019 FINDINGS: Lungs are hyperexpanded. The cardio pericardial silhouette is enlarged. Interstitial markings are diffusely coarsened with chronic features. Large hiatal hernia noted. Bones are diffusely demineralized. Telemetry leads overlie the chest. IMPRESSION: Cardiomegaly with chronic interstitial changes. Large hiatal hernia. Electronically Signed   By: Misty Stanley M.D.   On: 03/02/2019 19:09   Ct Head Wo Contrast  Result Date: 03/02/2019 CLINICAL DATA:  Head trauma, dizziness, fall. EXAM: CT HEAD WITHOUT CONTRAST CT CERVICAL SPINE WITHOUT CONTRAST TECHNIQUE: Multidetector CT imaging of the head and cervical spine was performed following the standard protocol without intravenous contrast. Multiplanar CT image reconstructions of the cervical spine were also generated. COMPARISON:  02/14/2019 FINDINGS: CT HEAD FINDINGS Brain: There is atrophy and chronic small vessel disease changes. No acute intracranial abnormality. Specifically, no hemorrhage, hydrocephalus, mass lesion, acute infarction, or significant intracranial injury. Vascular: No hyperdense vessel or unexpected calcification. Skull: No acute  calvarial abnormality. Sinuses/Orbits: Visualized paranasal sinuses and mastoids clear. Orbital soft tissues unremarkable. Other: None CT CERVICAL SPINE FINDINGS Alignment: No subluxation Skull base and vertebrae: No acute fracture. No primary bone lesion or focal pathologic process. Soft tissues and spinal canal: No prevertebral fluid or swelling. No visible canal hematoma. Disc levels: Diffuse degenerative disc disease and facet disease throughout the cervical spine. Upper chest: Biapical scarring.  No acute findings. Other: Carotid artery calcifications. IMPRESSION: Atrophy, chronic microvascular disease. No acute intracranial abnormality. Degenerative disc and facet disease throughout the cervical spine. No acute bony abnormality. Electronically Signed   By: Rolm Baptise M.D.   On: 03/02/2019 19:09   Ct Cervical Spine Wo Contrast  Result Date: 03/02/2019 CLINICAL DATA:  Head trauma, dizziness, fall. EXAM: CT HEAD WITHOUT CONTRAST CT CERVICAL SPINE WITHOUT CONTRAST TECHNIQUE: Multidetector CT imaging of the head and cervical spine was performed following the standard protocol without intravenous contrast. Multiplanar CT image reconstructions of the cervical spine were also generated. COMPARISON:  02/14/2019 FINDINGS: CT HEAD FINDINGS Brain: There is atrophy and chronic small vessel disease changes. No acute intracranial abnormality. Specifically, no  hemorrhage, hydrocephalus, mass lesion, acute infarction, or significant intracranial injury. Vascular: No hyperdense vessel or unexpected calcification. Skull: No acute calvarial abnormality. Sinuses/Orbits: Visualized paranasal sinuses and mastoids clear. Orbital soft tissues unremarkable. Other: None CT CERVICAL SPINE FINDINGS Alignment: No subluxation Skull base and vertebrae: No acute fracture. No primary bone lesion or focal pathologic process. Soft tissues and spinal canal: No prevertebral fluid or swelling. No visible canal hematoma. Disc levels: Diffuse  degenerative disc disease and facet disease throughout the cervical spine. Upper chest: Biapical scarring.  No acute findings. Other: Carotid artery calcifications. IMPRESSION: Atrophy, chronic microvascular disease. No acute intracranial abnormality. Degenerative disc and facet disease throughout the cervical spine. No acute bony abnormality. Electronically Signed   By: Rolm Baptise M.D.   On: 03/02/2019 19:09   Ct Hip Left Wo Contrast  Result Date: 03/02/2019 CLINICAL DATA:  Fall, left hip pain EXAM: CT OF THE LEFT HIP WITHOUT CONTRAST TECHNIQUE: Multidetector CT imaging of the left hip was performed according to the standard protocol. Multiplanar CT image reconstructions were also generated. COMPARISON:  Plain films earlier today FINDINGS: There is a left femoral neck fracture. Slight impaction laterally. No significant angulation or displacement. When compared to earlier plain films, this is very difficult to visualized by plain film. No subluxation or dislocation. Scattered sigmoid diverticula.  No active diverticulitis. IMPRESSION: Slightly impacted but otherwise nondisplaced fracture through the left femoral neck. Electronically Signed   By: Rolm Baptise M.D.   On: 03/02/2019 20:12   Dg Hip Unilat With Pelvis 2-3 Views Left  Result Date: 03/02/2019 CLINICAL DATA:  Fall, left hip pain EXAM: DG HIP (WITH OR WITHOUT PELVIS) 2-3V LEFT COMPARISON:  11/01/2018 FINDINGS: Degenerative changes within the hips bilaterally with joint space narrowing and spurring. SI joints symmetric and unremarkable. No acute bony abnormality. Specifically, no fracture, subluxation, or dislocation. IMPRESSION: Degenerative changes.  No acute bony abnormality. Electronically Signed   By: Rolm Baptise M.D.   On: 03/02/2019 19:10   Dg Femur Min 2 Views Left  Result Date: 03/02/2019 CLINICAL DATA:  Fall, left leg pain EXAM: LEFT FEMUR 2 VIEWS COMPARISON:  None. FINDINGS: No acute bony abnormality. Specifically, no fracture,  subluxation, or dislocation. No joint effusion within the left knee. IMPRESSION: No acute bony abnormality. Electronically Signed   By: Rolm Baptise M.D.   On: 03/02/2019 20:03        Scheduled Meds:  amitriptyline  10 mg Oral QHS   [START ON 03/04/2019] feeding supplement (ENSURE ENLIVE)  237 mL Oral Q24H   ketorolac  1 drop Right Eye BID   mometasone-formoterol  2 puff Inhalation BID   [START ON 03/04/2019] multivitamin with minerals  1 tablet Oral Daily   ondansetron (ZOFRAN) IV  4 mg Intravenous Once   pantoprazole  40 mg Oral QHS   rosuvastatin  10 mg Oral q1800   timolol  1 drop Both Eyes BID   Continuous Infusions:  methocarbamol (ROBAXIN) IV 500 mg (03/03/19 0127)     LOS: 1 day    Time spent: 35 minutes.     Elmarie Shiley, MD [facility] Pager [phone]  If 7PM-7AM, please contact night-coverage [email or web address] Password [facility] 03/03/2019, 12:41 PM

## 2019-03-03 NOTE — Transfer of Care (Signed)
Immediate Anesthesia Transfer of Care Note  Patient: Tina Mercado  Procedure(s) Performed: INTRAMEDULLARY (IM) NAIL INTERTROCHANTRIC (Left Hip)  Patient Location: PACU  Anesthesia Type:General  Level of Consciousness: drowsy and patient cooperative  Airway & Oxygen Therapy: Patient Spontanous Breathing and Patient connected to nasal cannula oxygen  Post-op Assessment: Report given to RN and Post -op Vital signs reviewed and stable  Post vital signs: Reviewed and stable  Last Vitals:  Vitals Value Taken Time  BP 151/69 03/03/2019  4:47 PM  Temp    Pulse 70 03/03/2019  4:47 PM  Resp 12 03/03/2019  4:47 PM  SpO2 98 % 03/03/2019  4:47 PM  Vitals shown include unvalidated device data.  Last Pain:  Vitals:   03/03/19 1437  TempSrc:   PainSc: 8       Patients Stated Pain Goal: 2 (33/35/45 6256)  Complications: No apparent anesthesia complications

## 2019-03-04 ENCOUNTER — Encounter (HOSPITAL_COMMUNITY): Payer: Self-pay | Admitting: Student

## 2019-03-04 LAB — CBC
HCT: 34.5 % — ABNORMAL LOW (ref 36.0–46.0)
Hemoglobin: 11.4 g/dL — ABNORMAL LOW (ref 12.0–15.0)
MCH: 28.8 pg (ref 26.0–34.0)
MCHC: 33 g/dL (ref 30.0–36.0)
MCV: 87.1 fL (ref 80.0–100.0)
Platelets: 161 10*3/uL (ref 150–400)
RBC: 3.96 MIL/uL (ref 3.87–5.11)
RDW: 13.9 % (ref 11.5–15.5)
WBC: 8.9 10*3/uL (ref 4.0–10.5)
nRBC: 0 % (ref 0.0–0.2)

## 2019-03-04 LAB — BASIC METABOLIC PANEL
Anion gap: 11 (ref 5–15)
BUN: 12 mg/dL (ref 8–23)
CO2: 20 mmol/L — ABNORMAL LOW (ref 22–32)
Calcium: 8.5 mg/dL — ABNORMAL LOW (ref 8.9–10.3)
Chloride: 101 mmol/L (ref 98–111)
Creatinine, Ser: 1.09 mg/dL — ABNORMAL HIGH (ref 0.44–1.00)
GFR calc Af Amer: 52 mL/min — ABNORMAL LOW (ref >60)
GFR calc non Af Amer: 45 mL/min — ABNORMAL LOW (ref >60)
Glucose, Bld: 153 mg/dL — ABNORMAL HIGH (ref 70–99)
Potassium: 3.8 mmol/L (ref 3.5–5.1)
Sodium: 132 mmol/L — ABNORMAL LOW (ref 135–145)

## 2019-03-04 MED ORDER — POLYETHYLENE GLYCOL 3350 17 G PO PACK
17.0000 g | PACK | Freq: Every day | ORAL | Status: DC
  Administered 2019-03-04 – 2019-03-07 (×4): 17 g via ORAL
  Filled 2019-03-04 (×4): qty 1

## 2019-03-04 MED ORDER — ENSURE ENLIVE PO LIQD
237.0000 mL | Freq: Two times a day (BID) | ORAL | Status: DC
  Administered 2019-03-04 – 2019-03-07 (×6): 237 mL via ORAL

## 2019-03-04 MED ORDER — GLYCERIN (LAXATIVE) 2.1 G RE SUPP
1.0000 | Freq: Every day | RECTAL | Status: DC | PRN
  Filled 2019-03-04: qty 1

## 2019-03-04 MED ORDER — ENOXAPARIN SODIUM 30 MG/0.3ML ~~LOC~~ SOLN
30.0000 mg | SUBCUTANEOUS | Status: DC
  Administered 2019-03-05 – 2019-03-07 (×3): 30 mg via SUBCUTANEOUS
  Filled 2019-03-04 (×3): qty 0.3

## 2019-03-04 MED ORDER — HYDROCODONE-ACETAMINOPHEN 5-325 MG PO TABS
1.0000 | ORAL_TABLET | Freq: Four times a day (QID) | ORAL | Status: DC | PRN
  Administered 2019-03-04 – 2019-03-07 (×7): 1 via ORAL
  Filled 2019-03-04 (×7): qty 1

## 2019-03-04 MED ORDER — ACETAMINOPHEN 325 MG PO TABS
650.0000 mg | ORAL_TABLET | Freq: Four times a day (QID) | ORAL | Status: DC | PRN
  Administered 2019-03-06 – 2019-03-07 (×2): 650 mg via ORAL
  Filled 2019-03-04 (×2): qty 2

## 2019-03-04 MED ORDER — SODIUM CHLORIDE 0.9 % IV SOLN
INTRAVENOUS | Status: DC
  Administered 2019-03-04: 1 mL via INTRAVENOUS

## 2019-03-04 NOTE — Progress Notes (Addendum)
Orthopaedic Trauma Progress Note  S: Patient doing well this morning. Minimal pain in hip, occasional muscle spasm. Has not required any pain medication since surgery. Does not want any scheduled pain medications, would prefer to ask for them as needed. Has not been out of bed yet, plans to work with therapy today. Wondering when she will be going home, and if she is going to to be discharged to home or a rehab facility. States her daughter will be coming up from [place] to stay with her if she is discharged home, but her daughter will not come up yet if she is discharged to rehab facility first.   O:  Vitals:   03/04/19 0506 03/04/19 0723  BP: 135/71   Pulse: 76   Resp: 14   Temp: 98.7 F (37.1 C)   SpO2: 95% 94%    General - Sitting up in bed, NAD, alert and oriented x 3. Pleasant and cooperative Left Lower Extremity - Dressings clean, dry, intact. Minimal bruising or swelling noted to the leg. Minimal tenderness to palpation of hip, thigh, knee. Flexes knee slightly without significant discomfort. Motor and sensory function intact. +DP pulse  Imaging: Stable post op imaging  Labs:  Results for orders placed or performed during the hospital encounter of 03/02/19 (from the past 24 hour(s))  CBC     Status: Abnormal   Collection Time: 03/04/19  1:15 AM  Result Value Ref Range   WBC 8.9 4.0 - 10.5 K/uL   RBC 3.96 3.87 - 5.11 MIL/uL   Hemoglobin 11.4 (L) 12.0 - 15.0 g/dL   HCT 34.5 (L) 36.0 - 46.0 %   MCV 87.1 80.0 - 100.0 fL   MCH 28.8 26.0 - 34.0 pg   MCHC 33.0 30.0 - 36.0 g/dL   RDW 13.9 11.5 - 15.5 %   Platelets 161 150 - 400 K/uL   nRBC 0.0 0.0 - 0.2 %    Assessment: 85 year old s/p ground level fall  Injuries: Left femoral neck fracture s/p intramedullary nailing on 03/03/19  Weightbearing: WBAT LLE  Insicional and dressing care: Dressings clean, dry, intact. Plan to change/remove tomorrow  Orthopedic device(s): None  CV/Blood loss: Hgb 11.4 this AM, hemodynamically  stable  Pain management:  1. Tylenol 650 mg q 6 hours PRN 2. Robaxin 500 mg q 6 hours PRN 3. Hydrocodone 5/325 q 6 hours PRN 4. Morphine 1-2 mg q 2 hours PRN  VTE prophylaxis: Lovenox 40 mg daily starting today  ID: Ancef 2gm post op  Foley/Lines: No foley, KVO IVFs  Medical co-morbidities: HTN, GERD, COPD, Hyperlipidemia, Osteoporosis, Mitral regurg  Impediments to Fracture Healing: Osteoporosis  Dispo: PT eval today, will likely need SNF. Okay for discharge from orthopaedic perspective once cleared by therapy and medical team  Follow - up plan: 2 weeks   Karver Fadden A[facility] ?[phone] (phone)

## 2019-03-04 NOTE — Evaluation (Signed)
Physical Therapy Evaluation Patient Details Name: Tina Mercado MRN: [id number] DOB: [birth year] Today's Date: 03/04/2019   History of Present Illness  Patient is a 85 y/o female who presents with left hip pain s/p fall at home now s/p IM nail left femur. PMH includes HTN, HLD, COPD, glaucoma.   Clinical Impression  Patient presents with pain and post surgical deficits s/p above surgery. Requires Mod A for bed mobility and standing transfer with use of RW for support. Able to take a few steps to get to chair with assist of 2 for safety with increased time. Pt lives alone and independent PTA using SPC vs rollator for ambulation. Has outside support for IADLs and grocery shopping from nephew and church friends. Would benefit from SNF to maximize independence and mobility prior to return home. Pt reluctant but agreeable. Will follow acutely.    Follow Up Recommendations SNF;Supervision for mobility/OOB;Supervision/Assistance - 24 hour    Equipment Recommendations  None recommended by PT    Recommendations for Other Services       Precautions / Restrictions Precautions Precautions: Fall Restrictions Weight Bearing Restrictions: Yes LLE Weight Bearing: Weight bearing as tolerated      Mobility  Bed Mobility Overal bed mobility: Needs Assistance Bed Mobility: Rolling;Sidelying to Sit Rolling: Min assist Sidelying to sit: Mod assist;+2 for physical assistance       General bed mobility comments: Step by step cues to reach for rail, assist with LLE and to scoot bottom to EOB. increased time.  Transfers Overall transfer level: Needs assistance Equipment used: Rolling walker (2 wheeled) Transfers: Sit to/from Stand Sit to Stand: Min assist         General transfer comment: Assist to power to standing with cues for hand/foot placement; first stand, pt with LOB onto bed reporting dizziness but actually fear of falling when probed, stood again with cues for WB through BUEs.    Ambulation/Gait Ambulation/Gait assistance: Min assist;+2 physical assistance Gait Distance (Feet): 3 Feet Assistive device: Rolling walker (2 wheeled) Gait Pattern/deviations: Step-to pattern;Trunk flexed Gait velocity: Decreased   General Gait Details: Able to take a few steps to get to chair with assist for RW management and assist for weightshifting. Increased time.   Stairs            Wheelchair Mobility    Modified Rankin (Stroke Patients Only)       Balance Overall balance assessment: Needs assistance;History of Falls Sitting-balance support: Feet supported;Single extremity supported Sitting balance-Leahy Scale: Fair     Standing balance support: During functional activity Standing balance-Leahy Scale: Poor Standing balance comment: Requires BUE support in standing with RW.                              Pertinent Vitals/Pain Pain Assessment: Faces Faces Pain Scale: Hurts whole lot Pain Location: LLE with movement, worse in knee flexion Pain Descriptors / Indicators: Sore;Discomfort;Grimacing;Guarding;Operative site guarding;Sharp Pain Intervention(s): Monitored during session;Repositioned;Patient requesting pain meds-RN notified;Limited activity within patient's tolerance    Home Living Family/patient expects to be discharged to:: Private residence Living Arrangements: Alone Available Help at Discharge: Family;Friend(s);Available PRN/intermittently Type of Home: House Home Access: Stairs to enter Entrance Stairs-Rails: Right Entrance Stairs-Number of Steps: 3 Home Layout: One level Home Equipment: Clinical cytogeneticist - 4 wheels;Cane - single point;Grab bars - tub/shower      Prior Function Level of Independence: Independent with assistive device(s)         Comments:  Nephew helps with grocery shopping as well as neighbors. Reports some falls. Independent for household ambulation, rollator for night trips to bathroom and [facility] for community  ambulation.     Hand Dominance        Extremity/Trunk Assessment   Upper Extremity Assessment Upper Extremity Assessment: Defer to OT evaluation    Lower Extremity Assessment Lower Extremity Assessment: LLE deficits/detail LLE Deficits / Details: Limited post operatively due to pain, able to perform LAQ, minimal hip flexion; ankle AROM WFL. LLE: Unable to fully assess due to pain LLE Sensation: WNL LLE Coordination: decreased fine motor;decreased gross motor    Cervical / Trunk Assessment Cervical / Trunk Assessment: Kyphotic  Communication   Communication: No difficulties  Cognition Arousal/Alertness: Awake/alert Behavior During Therapy: WFL for tasks assessed/performed Overall Cognitive Status: Within Functional Limits for tasks assessed                                        General Comments General comments (skin integrity, edema, etc.): Sp02 remained >88% on RA, donned 02 post session, 1L 02    Exercises General Exercises - Lower Extremity Ankle Circles/Pumps: AROM;10 reps;Seated Long Arc Quad: AROM;5 reps;Seated   Assessment/Plan    PT Assessment Patient needs continued PT services  PT Problem List Decreased strength;Decreased balance;Pain;Cardiopulmonary status limiting activity;Decreased mobility;Decreased range of motion;Decreased activity tolerance;Decreased skin integrity       PT Treatment Interventions Functional mobility training;Balance training;Patient/family education;Gait training;Therapeutic activities;Therapeutic exercise;Stair training    PT Goals (Current goals can be found in the Care Plan section)  Acute Rehab PT Goals Patient Stated Goal: to be able to stay at home as long as possible PT Goal Formulation: With patient Time For Goal Achievement: 03/18/19 Potential to Achieve Goals: Good    Frequency Min 3X/week   Barriers to discharge Decreased caregiver support lives alone    Co-evaluation                AM-PAC PT "6 Clicks" Mobility  Outcome Measure Help needed turning from your back to your side while in a flat bed without using bedrails?: A Little Help needed moving from lying on your back to sitting on the side of a flat bed without using bedrails?: A Lot Help needed moving to and from a bed to a chair (including a wheelchair)?: A Lot Help needed standing up from a chair using your arms (e.g., wheelchair or bedside chair)?: A Lot Help needed to walk in hospital room?: A Lot Help needed climbing 3-5 steps with a railing? : Total 6 Click Score: 12    End of Session Equipment Utilized During Treatment: Gait belt Activity Tolerance: Patient tolerated treatment well;Patient limited by pain Patient left: in chair;with call bell/phone within reach;with chair alarm set;with nursing/sitter in room Nurse Communication: Mobility status;Patient requests pain meds PT Visit Diagnosis: Pain;Difficulty in walking, not elsewhere classified (R26.2);Unsteadiness on feet (R26.81);History of falling (Z91.81) Pain - Right/Left: Left Pain - part of body: Leg    Time: 9242-6834 PT Time Calculation (min) (ACUTE ONLY): 31 min   Charges:   PT Evaluation $PT Eval Moderate Complexity: 1 Mod PT Treatments $Therapeutic Activity: 8-22 mins        Wray Kearns, PT, DPT Acute Rehabilitation Services Pager [phone] Office [phone]      Marguarite Arbour A Sabra Heck 03/04/2019, 10:51 AM

## 2019-03-04 NOTE — Progress Notes (Signed)
PROGRESS NOTE    Tina Mercado  UDJ:[id number] DOB: 27-Aug-1929 DOA: 03/02/2019 PCP: Shawnee Knapp, MD    Brief Narrative; 85 year old with past medical history significant for hypertension, GERD, COPD, balance difficulty with recurrent falls who presented to the emergency department with left hip pain after a fall at home.  Patient denies lightheadedness or presyncopal episode prior to falling.  No loss of consciousness.  She denies chest pain or shortness of breath.  Patient admitted with left hip fracture.   Assessment & Plan:   Principal Problem:   Closed left hip fracture, initial encounter [facility]) Active Problems:   Benign hypertensive heart disease without heart failure   COPD mixed type ([place])   Large hiatal hernia  1-Left hip fracture:  left femoral neck fracture s/p intramedullary nailing on 5/7 Patient presents after a fall at home. CT with left femoral neck fracture. Plan for surgery today. Risk for surgery estimated to be 1% for perioperative MI or cardiac arrest. Avoid the Dilaudid.  Reduce porphine  PT recommending SNF.  DVT prophylaxis Lovenox.  Weightbearing the left lower extremity.  2-COPD: Stable continue with inhalers. She is currently on 3 L of oxygen, she was placed on oxygen last night after she received Dilaudid.  Oxygen supplementation had decreased to 1 L and her oxygen saturation remained 97%. Taper Oxygen off as needed.  3-Hypertension: PRN hydralazine.  Balance difficulty frequent cough PT to evaluate after surgery.  Large Hiatal hernia; continue with PPI  Acute hypoxic respiratory failure: Related to medication.  Discontinue Dilaudid. Oxygen improved now on 1 L down from 3 Taper off oxygen.   Hypotension, orthostatic;  Start IV fluids.  Repeat orthostatics in the morning  Mild hyponatremia: We will start IV fluids  Nutrition Problem: Increased nutrient needs Etiology: post-op healing    Signs/Symptoms: estimated  needs    Interventions: Ensure Enlive (each supplement provides 350kcal and 20 grams of protein), MVI  Estimated body mass index is 22.86 kg/m as calculated from the following:   Height as of this encounter: 5\' 2"  (1.575 m).   Weight as of this encounter: 56.7 kg.   DVT prophylaxis: SCDs Code Status: Full code Family Communication: Care discussed with patient Disposition Plan: Remain in the hospital for surgery  Consultants:   Orthopedic   Procedures:  None   Antimicrobials:   None   Subjective: Per nurse patient SBP drop to 70  when patient was transfer from bed to bedside commode.  Patient currently denies dizziness, chest pain.   Objective: Vitals:   03/03/19 2140 03/04/19 0506 03/04/19 0723 03/04/19 1352  BP:  135/71  116/60  Pulse:  76  72  Resp:  14    Temp:  98.7 F (37.1 C)  98.5 F (36.9 C)  TempSrc:  Oral  Oral  SpO2: 95% 95% 94% 97%  Weight:      Height:        Intake/Output Summary (Last 24 hours) at 03/04/2019 1451 Last data filed at 03/04/2019 1300 Gross per 24 hour  Intake 1282.5 ml  Output 925 ml  Net 357.5 ml   Filed Weights   03/03/19 1044 03/03/19 1437  Weight: 56.7 kg 56.7 kg    Examination:  General exam: NAD Respiratory system: CTA Cardiovascular system: S 1, S 2 RRR Gastrointestinal system: BS present, soft, nt, nd Central nervous system: non focal. Extremities: dressing left hip Skin: no rashes    Data Reviewed: I have personally reviewed following labs and imaging studies  CBC:  Recent Labs  Lab 03/02/19 1835 03/04/19 0115  WBC 8.1 8.9  NEUTROABS 6.1  --   HGB 12.1 11.4*  HCT 37.9 34.5*  MCV 94.0 87.1  PLT 237 616   Basic Metabolic Panel: Recent Labs  Lab 03/02/19 1835 03/04/19 1008  NA 136 132*  K 4.8 3.8  CL 107 101  CO2 20* 20*  GLUCOSE 104* 153*  BUN 15 12  CREATININE 0.89 1.09*  CALCIUM 8.7* 8.5*   GFR: Estimated Creatinine Clearance: 28.2 mL/min (A) (by C-G formula based on SCr of 1.09  mg/dL (H)). Liver Function Tests: No results for input(s): AST, ALT, ALKPHOS, BILITOT, PROT, ALBUMIN in the last 168 hours. No results for input(s): LIPASE, AMYLASE in the last 168 hours. No results for input(s): AMMONIA in the last 168 hours. Coagulation Profile: No results for input(s): INR, PROTIME in the last 168 hours. Cardiac Enzymes: No results for input(s): CKTOTAL, CKMB, CKMBINDEX, TROPONINI in the last 168 hours. BNP (last 3 results) No results for input(s): PROBNP in the last 8760 hours. HbA1C: No results for input(s): HGBA1C in the last 72 hours. CBG: No results for input(s): GLUCAP in the last 168 hours. Lipid Profile: No results for input(s): CHOL, HDL, LDLCALC, TRIG, CHOLHDL, LDLDIRECT in the last 72 hours. Thyroid Function Tests: No results for input(s): TSH, T4TOTAL, FREET4, T3FREE, THYROIDAB in the last 72 hours. Anemia Panel: No results for input(s): VITAMINB12, FOLATE, FERRITIN, TIBC, IRON, RETICCTPCT in the last 72 hours. Sepsis Labs: No results for input(s): PROCALCITON, LATICACIDVEN in the last 168 hours.  Recent Results (from the past 240 hour(s))  MRSA PCR Screening     Status: None   Collection Time: 03/03/19 12:34 AM  Result Value Ref Range Status   MRSA by PCR NEGATIVE NEGATIVE Final    Comment:        The GeneXpert MRSA Assay (FDA approved for NASAL specimens only), is one component of a comprehensive MRSA colonization surveillance program. It is not intended to diagnose MRSA infection nor to guide or monitor treatment for MRSA infections. Performed at [facility], [place] [address], [place], [place] [address]   SARS Coronavirus 2 (CEPHEID - Performed in [place] hospital lab), Hosp Order     Status: None   Collection Time: 03/03/19  1:20 AM  Result Value Ref Range Status   SARS Coronavirus 2 NEGATIVE NEGATIVE Final    Comment: (NOTE) If result is NEGATIVE SARS-CoV-2 target nucleic acids are NOT DETECTED. The SARS-CoV-2 RNA is  generally detectable in upper and lower  respiratory specimens during the acute phase of infection. The lowest  concentration of SARS-CoV-2 viral copies this assay can detect is 250  copies / mL. A negative result does not preclude SARS-CoV-2 infection  and should not be used as the sole basis for treatment or other  patient management decisions.  A negative result may occur with  improper specimen collection / handling, submission of specimen other  than nasopharyngeal swab, presence of viral mutation(s) within the  areas targeted by this assay, and inadequate number of viral copies  (<250 copies / mL). A negative result must be combined with clinical  observations, patient history, and epidemiological information. If result is POSITIVE SARS-CoV-2 target nucleic acids are DETECTED. The SARS-CoV-2 RNA is generally detectable in upper and lower  respiratory specimens dur ing the acute phase of infection.  Positive  results are indicative of active infection with SARS-CoV-2.  Clinical  correlation with patient history and other diagnostic information  is  necessary to determine patient infection status.  Positive results do  not rule out bacterial infection or co-infection with other viruses. If result is PRESUMPTIVE POSTIVE SARS-CoV-2 nucleic acids MAY BE PRESENT.   A presumptive positive result was obtained on the submitted specimen  and confirmed on repeat testing.  While 2019 novel coronavirus  (SARS-CoV-2) nucleic acids may be present in the submitted sample  additional confirmatory testing may be necessary for epidemiological  and / or clinical management purposes  to differentiate between  SARS-CoV-2 and other Sarbecovirus currently known to infect humans.  If clinically indicated additional testing with an alternate test  methodology [phone]) is advised. The SARS-CoV-2 RNA is generally  detectable in upper and lower respiratory sp ecimens during the acute  phase of  infection. The expected result is Negative. Fact Sheet for Patients:  StrictlyIdeas.no Fact Sheet for Healthcare Providers: BankingDealers.co.za This test is not yet approved or cleared by the [place] FDA and has been authorized for detection and/or diagnosis of SARS-CoV-2 by FDA under an Emergency Use Authorization (EUA).  This EUA will remain in effect (meaning this test can be used) for the duration of the COVID-19 declaration under Section 564(b)(1) of the Act, 21 U.S.C. section 360bbb-3(b)(1), unless the authorization is terminated or revoked sooner. Performed at [facility], [place] [address], [place], [place] [address]          Radiology Studies: Dg Chest 1 View  Result Date: 03/02/2019 CLINICAL DATA:  Preoperative respiratory evaluation for hip fracture. EXAM: CHEST  1 VIEW COMPARISON:  02/14/2019 FINDINGS: Lungs are hyperexpanded. The cardio pericardial silhouette is enlarged. Interstitial markings are diffusely coarsened with chronic features. Large hiatal hernia noted. Bones are diffusely demineralized. Telemetry leads overlie the chest. IMPRESSION: Cardiomegaly with chronic interstitial changes. Large hiatal hernia. Electronically Signed   By: Misty Stanley M.D.   On: 03/02/2019 19:09   Ct Head Wo Contrast  Result Date: 03/02/2019 CLINICAL DATA:  Head trauma, dizziness, fall. EXAM: CT HEAD WITHOUT CONTRAST CT CERVICAL SPINE WITHOUT CONTRAST TECHNIQUE: Multidetector CT imaging of the head and cervical spine was performed following the standard protocol without intravenous contrast. Multiplanar CT image reconstructions of the cervical spine were also generated. COMPARISON:  02/14/2019 FINDINGS: CT HEAD FINDINGS Brain: There is atrophy and chronic small vessel disease changes. No acute intracranial abnormality. Specifically, no hemorrhage, hydrocephalus, mass lesion, acute infarction, or significant intracranial injury.  Vascular: No hyperdense vessel or unexpected calcification. Skull: No acute calvarial abnormality. Sinuses/Orbits: Visualized paranasal sinuses and mastoids clear. Orbital soft tissues unremarkable. Other: None CT CERVICAL SPINE FINDINGS Alignment: No subluxation Skull base and vertebrae: No acute fracture. No primary bone lesion or focal pathologic process. Soft tissues and spinal canal: No prevertebral fluid or swelling. No visible canal hematoma. Disc levels: Diffuse degenerative disc disease and facet disease throughout the cervical spine. Upper chest: Biapical scarring.  No acute findings. Other: Carotid artery calcifications. IMPRESSION: Atrophy, chronic microvascular disease. No acute intracranial abnormality. Degenerative disc and facet disease throughout the cervical spine. No acute bony abnormality. Electronically Signed   By: Rolm Baptise M.D.   On: 03/02/2019 19:09   Ct Cervical Spine Wo Contrast  Result Date: 03/02/2019 CLINICAL DATA:  Head trauma, dizziness, fall. EXAM: CT HEAD WITHOUT CONTRAST CT CERVICAL SPINE WITHOUT CONTRAST TECHNIQUE: Multidetector CT imaging of the head and cervical spine was performed following the standard protocol without intravenous contrast. Multiplanar CT image reconstructions of the cervical spine were also generated. COMPARISON:  02/14/2019 FINDINGS: CT  HEAD FINDINGS Brain: There is atrophy and chronic small vessel disease changes. No acute intracranial abnormality. Specifically, no hemorrhage, hydrocephalus, mass lesion, acute infarction, or significant intracranial injury. Vascular: No hyperdense vessel or unexpected calcification. Skull: No acute calvarial abnormality. Sinuses/Orbits: Visualized paranasal sinuses and mastoids clear. Orbital soft tissues unremarkable. Other: None CT CERVICAL SPINE FINDINGS Alignment: No subluxation Skull base and vertebrae: No acute fracture. No primary bone lesion or focal pathologic process. Soft tissues and spinal canal: No  prevertebral fluid or swelling. No visible canal hematoma. Disc levels: Diffuse degenerative disc disease and facet disease throughout the cervical spine. Upper chest: Biapical scarring.  No acute findings. Other: Carotid artery calcifications. IMPRESSION: Atrophy, chronic microvascular disease. No acute intracranial abnormality. Degenerative disc and facet disease throughout the cervical spine. No acute bony abnormality. Electronically Signed   By: Rolm Baptise M.D.   On: 03/02/2019 19:09   Ct Hip Left Wo Contrast  Result Date: 03/02/2019 CLINICAL DATA:  Fall, left hip pain EXAM: CT OF THE LEFT HIP WITHOUT CONTRAST TECHNIQUE: Multidetector CT imaging of the left hip was performed according to the standard protocol. Multiplanar CT image reconstructions were also generated. COMPARISON:  Plain films earlier today FINDINGS: There is a left femoral neck fracture. Slight impaction laterally. No significant angulation or displacement. When compared to earlier plain films, this is very difficult to visualized by plain film. No subluxation or dislocation. Scattered sigmoid diverticula.  No active diverticulitis. IMPRESSION: Slightly impacted but otherwise nondisplaced fracture through the left femoral neck. Electronically Signed   By: Rolm Baptise M.D.   On: 03/02/2019 20:12   Dg C-arm 1-60 Min  Result Date: 03/03/2019 CLINICAL DATA:  IM left femoral nailing. EXAM: DG C-ARM 61-120 MIN; OPERATIVE LEFT HIP WITH PELVIS FLUOROSCOPY TIME:  2 minutes, 17 seconds COMPARISON:  Left hip radiographs-earlier same day FINDINGS: Seven spot intraoperative fluoroscopic images of the left hip are provided for review. Images demonstrate the sequela short-segment intramedullary rod nailing of the left femur and dynamic screw fixation of the left femoral neck. The distal end of the femoral rod is transfixed with a single cancellous screw. There is a minimal amount of subcutaneous emphysema about the operative site. No radiopaque foreign  body. IMPRESSION: Post ORIF of the proximal left femur and femoral neck without evidence of complication. Electronically Signed   By: Sandi Mariscal M.D.   On: 03/03/2019 16:40   Dg Hip Operative Unilat W Or W/o Pelvis Left  Result Date: 03/03/2019 CLINICAL DATA:  IM left femoral nailing. EXAM: DG C-ARM 61-120 MIN; OPERATIVE LEFT HIP WITH PELVIS FLUOROSCOPY TIME:  2 minutes, 17 seconds COMPARISON:  Left hip radiographs-earlier same day FINDINGS: Seven spot intraoperative fluoroscopic images of the left hip are provided for review. Images demonstrate the sequela short-segment intramedullary rod nailing of the left femur and dynamic screw fixation of the left femoral neck. The distal end of the femoral rod is transfixed with a single cancellous screw. There is a minimal amount of subcutaneous emphysema about the operative site. No radiopaque foreign body. IMPRESSION: Post ORIF of the proximal left femur and femoral neck without evidence of complication. Electronically Signed   By: Sandi Mariscal M.D.   On: 03/03/2019 16:40   Dg Hip Unilat W Or W/o Pelvis 2-3 Views Left  Result Date: 03/03/2019 CLINICAL DATA:  Status post surgical internal fixation of left femoral neck fracture. EXAM: DG HIP (WITH OR WITHOUT PELVIS) 2-3V LEFT COMPARISON:  None. FINDINGS: Status post surgical internal fixation of left femoral neck  fracture. Good alignment of fracture components is noted. Expected postoperative changes are noted in the surrounding soft tissues. IMPRESSION: Status post surgical internal fixation of left femoral neck fracture. Electronically Signed   By: Marijo Conception M.D.   On: 03/03/2019 17:22   Dg Hip Unilat With Pelvis 2-3 Views Left  Result Date: 03/02/2019 CLINICAL DATA:  Fall, left hip pain EXAM: DG HIP (WITH OR WITHOUT PELVIS) 2-3V LEFT COMPARISON:  11/01/2018 FINDINGS: Degenerative changes within the hips bilaterally with joint space narrowing and spurring. SI joints symmetric and unremarkable. No acute bony  abnormality. Specifically, no fracture, subluxation, or dislocation. IMPRESSION: Degenerative changes.  No acute bony abnormality. Electronically Signed   By: Rolm Baptise M.D.   On: 03/02/2019 19:10   Dg Femur Min 2 Views Left  Result Date: 03/02/2019 CLINICAL DATA:  Fall, left leg pain EXAM: LEFT FEMUR 2 VIEWS COMPARISON:  None. FINDINGS: No acute bony abnormality. Specifically, no fracture, subluxation, or dislocation. No joint effusion within the left knee. IMPRESSION: No acute bony abnormality. Electronically Signed   By: Rolm Baptise M.D.   On: 03/02/2019 20:03        Scheduled Meds:  amitriptyline  10 mg Oral QHS   cholecalciferol  4,000 Units Oral Daily   enoxaparin (LOVENOX) injection  40 mg Subcutaneous Q24H   feeding supplement (ENSURE ENLIVE)  237 mL Oral BID BM   ketorolac  1 drop Right Eye BID   mometasone-formoterol  2 puff Inhalation BID   multivitamin with minerals  1 tablet Oral Daily   ondansetron (ZOFRAN) IV  4 mg Intravenous Once   pantoprazole  40 mg Oral QHS   polyethylene glycol  17 g Oral Daily   rosuvastatin  10 mg Oral q1800   timolol  1 drop Both Eyes BID   Continuous Infusions:  sodium chloride      ceFAZolin (ANCEF) IV 2 g (03/04/19 0542)   lactated ringers 10 mL/hr at 03/03/19 1429     LOS: 2 days    Time spent: 35 minutes.     Elmarie Shiley, MD [facility] Pager [phone]  If 7PM-7AM, please contact night-coverage [email or web address] Password [email or web address] 03/04/2019, 2:51 PM

## 2019-03-04 NOTE — TOC Initial Note (Signed)
Transition of Care [facility]) - Initial/Assessment Note    Patient Details  Name: Tina Mercado MRN: [id number] Date of Birth: 11-15-1928  Transition of Care [facility]) CM/SW Contact:    Gelene Mink, [place] Phone Number: 03/04/2019, 4:20 PM  Clinical Narrative:                  CSW met with the patient at bedside. She was awake and oriented. CSW introduced herself and explained her role. The patient has not been to skilled nursing in the past, she is familiar with [facility]. She stated that she has some friends and some of her church family that have been to [facility]. She lives at home alone. She stated that she has a daughter that lives in [place].   She stated that she has a rolling walker and a lift chair at home. The patient is agreeable to going to skilled rehab once she is medically stable. CSW explained that process and answered questions that the patient had. First preference of facility choice is [facility]. CSW did receive permission to fax the patient out. She does want bed offers once they become available.   Expected Discharge Plan: Skilled Nursing Facility Barriers to Discharge: Continued Medical Work up   Patient Goals and CMS Choice Patient states their goals for this hospitalization and ongoing recovery are:: Pt would like to go to rehab so that she is able to return home CMS Medicare.gov Compare Post Acute Care list provided to:: Patient Choice offered to / list presented to : Patient  Expected Discharge Plan and Services Expected Discharge Plan: [place] In-house Referral: Clinical Social Work Discharge Planning Services: NA Post Acute Care Choice: [place] Living arrangements for the past 2 months: Single Family Home                 DME Arranged: N/A DME Agency: NA       HH Arranged: NA [place] Agency: NA        Prior Living Arrangements/Services Living arrangements for the past 2 months: [place] Lives with::  Self Patient language and need for interpreter reviewed:: No Do you feel safe going back to the place where you live?: Yes      Need for Family Participation in Patient Care: No (Comment) Care giver support system in place?: Yes (comment)   Criminal Activity/Legal Involvement Pertinent to Current Situation/Hospitalization: No - Comment as needed  Activities of Daily Living Home Assistive Devices/Equipment: Walker (specify type) ADL Screening (condition at time of admission) Patient's cognitive ability adequate to safely complete daily activities?: Yes Is the patient deaf or have difficulty hearing?: No Does the patient have difficulty seeing, even when wearing glasses/contacts?: No Does the patient have difficulty concentrating, remembering, or making decisions?: No Patient able to express need for assistance with ADLs?: Yes Does the patient have difficulty dressing or bathing?: No Independently performs ADLs?: Yes (appropriate for developmental age) Does the patient have difficulty walking or climbing stairs?: Yes Weakness of Legs: Both Weakness of Arms/Hands: None  Permission Sought/Granted Permission sought to share information with : Case Manager Permission granted to share information with : Yes, Verbal Permission Granted     Permission granted to share info w AGENCY: SNF        Emotional Assessment Appearance:: Appears stated age Attitude/Demeanor/Rapport: Engaged Affect (typically observed): Calm Orientation: : Oriented to Self, Oriented to  Time, Oriented to Place, Oriented to Situation Alcohol / Substance Use: Not Applicable Psych Involvement: No (comment)  Admission diagnosis:  Fall [W19.XXXA] Closed fracture of left hip, initial encounter [facility]) [S72.002A] Patient Active Problem List   Diagnosis Date Noted  . Closed left hip fracture, initial encounter ([place]) 03/02/2019  . Prediabetes 09/28/2017  . Dizziness 09/15/2017  . Neuropathy 07/16/2017  . COPD mixed type  ([place]) 04/10/2015  . Laryngopharyngeal reflux (LPR) 04/10/2015  . Chronic laryngitis 08/17/2014  . Blindness and low vision 04/13/2013  . Osteoporosis 12/23/2012  . Bronchitis 10/30/2011  . Benign hypertensive heart disease without heart failure 08/05/2011  . Pure hypercholesterolemia 08/05/2011  . Mitral regurgitation   . History of anemia   . History of diastolic dysfunction   . GERD (gastroesophageal reflux disease)   . History of seasonal allergies   . Large hiatal hernia 11/23/2004   PCP:  Shawnee Knapp, MD Pharmacy:   [id number]: [phone]: [phone]    Social Determinants of Health (SDOH) Interventions    Readmission Risk Interventions Readmission Risk Prevention Plan 03/04/2019  Post Dischage Appt Not Complete  Medication Screening Complete  Transportation Screening Complete  Some recent data might be hidden

## 2019-03-04 NOTE — NC FL2 (Signed)
[place] LEVEL OF CARE SCREENING TOOL     IDENTIFICATION  Patient Name: Tina Mercado Birthdate: [birth year] Sex: female Admission Date (Current Location): 03/02/2019  [facility] and [place] Number:  Herbalist and Address:  [facility], [place] [address] [place], [place] [address]      Provider Number: [id number]  Attending Physician Name and Address:  Elmarie Shiley, MD  Relative Name and Phone Number:  Briscoe Deutscher, friend, [phone]    Current Level of Care: Hospital Recommended Level of Care: [place] Prior Approval Number:    Date Approved/Denied:   PASRR Number: [id number]  Discharge Plan: SNF    Current Diagnoses: Patient Active Problem List   Diagnosis Date Noted  . Closed left hip fracture, initial encounter ([place]) 03/02/2019  . Prediabetes 09/28/2017  . Dizziness 09/15/2017  . Neuropathy 07/16/2017  . COPD mixed type ([place]) 04/10/2015  . Laryngopharyngeal reflux (LPR) 04/10/2015  . Chronic laryngitis 08/17/2014  . Blindness and low vision 04/13/2013  . Osteoporosis 12/23/2012  . Bronchitis 10/30/2011  . Benign hypertensive heart disease without heart failure 08/05/2011  . Pure hypercholesterolemia 08/05/2011  . Mitral regurgitation   . History of anemia   . History of diastolic dysfunction   . GERD (gastroesophageal reflux disease)   . History of seasonal allergies   . Large hiatal hernia 11/23/2004    Orientation RESPIRATION BLADDER Height & Weight     Self, Time, Situation, Place  O2(2L nasal canula) Continent, External catheter Weight: 125 lb (56.7 kg) Height:  5\' 2"  (157.5 cm)  BEHAVIORAL SYMPTOMS/MOOD NEUROLOGICAL BOWEL NUTRITION STATUS      Continent Diet(see discharge summary)  AMBULATORY STATUS COMMUNICATION OF NEEDS Skin   Extensive Assist Verbally Surgical wounds(incision on L thigh with adhesive bandage)                       Personal Care Assistance Level of  Assistance  Bathing, Feeding, Dressing Bathing Assistance: Maximum assistance Feeding assistance: Independent Dressing Assistance: Maximum assistance     Functional Limitations Info  Sight, Hearing, Speech Sight Info: Impaired Hearing Info: Impaired Speech Info: Adequate    SPECIAL CARE FACTORS FREQUENCY  PT (By licensed PT), OT (By licensed OT)     PT Frequency: 5x week OT Frequency: 5x week            Contractures Contractures Info: Not present    Additional Factors Info  Code Status, Allergies, Psychotropic Code Status Info: Full Code Allergies Info: CONTRAST MEDIA IODINATED DIAGNOSTIC AGENTS, ANTIHISTAMINES, DIPHENHYDRAMINE-TYPE, CELEBREX CELECOXIB, DARVOCET PROPOXYPHENE N-ACETAMINOPHEN, DEMEROL, DIPHENHYDRAMINE HCL, FELDENE PIROXICAM, GABAPENTIN, MEPERIDINE, PROPOXYPHENE, IODINE-131  Psychotropic Info: amitriptyline (ELAVIL) tablet 10 mg          Current Medications (03/04/2019):  This is the current hospital active medication list Current Facility-Administered Medications  Medication Dose Route Frequency Provider Last Rate Last Dose  . 0.9 %  sodium chloride infusion   Intravenous Continuous Regalado, Belkys A, MD      . acetaminophen (TYLENOL) tablet 650 mg  650 mg Oral Q6H PRN Patrecia Pace A, PA-C      . albuterol (PROVENTIL) (2.5 MG/3ML) 0.083% nebulizer solution 2.5 mg  2.5 mg Inhalation Q4H PRN Delray Alt, PA-C      . amitriptyline (ELAVIL) tablet 10 mg  10 mg Oral QHS Patrecia Pace A, PA-C   10 mg at 03/03/19 2230  . bisacodyl (DULCOLAX) EC tablet 5 mg  5 mg Oral  Daily PRN Delray Alt, PA-C      . ceFAZolin (ANCEF) IVPB 2g/100 mL premix  2 g Intravenous Q8H Patrecia Pace A, PA-C 200 mL/hr at 03/04/19 0542 2 g at 03/04/19 0542  . cholecalciferol (VITAMIN D3) tablet 4,000 Units  4,000 Units Oral Daily Delray Alt, PA-C   4,000 Units at 03/04/19 8295  . enoxaparin (LOVENOX) injection 40 mg  40 mg Subcutaneous Q24H Patrecia Pace A, PA-C   40 mg at  03/04/19 6213  . feeding supplement (ENSURE ENLIVE) (ENSURE ENLIVE) liquid 237 mL  237 mL Oral BID BM Regalado, Belkys A, MD      . Glycerin (Adult) 2.1 g suppository 1 suppository  1 suppository Rectal Daily PRN Regalado, Belkys A, MD      . hydrALAZINE (APRESOLINE) injection 10 mg  10 mg Intravenous Q4H PRN Delray Alt, PA-C      . HYDROcodone-acetaminophen (NORCO/VICODIN) 5-325 MG per tablet 1 tablet  1 tablet Oral Q6H PRN Delray Alt, PA-C   1 tablet at 03/04/19 1036  . ketorolac (ACULAR) 0.5 % ophthalmic solution 1 drop  1 drop Right Eye BID Delray Alt, PA-C   1 drop at 03/04/19 0865  . lactated ringers infusion   Intravenous Continuous Delray Alt, PA-C 10 mL/hr at 03/03/19 1429    . methocarbamol (ROBAXIN) tablet 500 mg  500 mg Oral Q6H PRN Patrecia Pace A, PA-C   500 mg at 03/04/19 1036  . mometasone-formoterol (DULERA) 200-5 MCG/ACT inhaler 2 puff  2 puff Inhalation BID Delray Alt, PA-C   2 puff at 03/04/19 7846  . morphine 2 MG/ML injection 1-2 mg  1-2 mg Intravenous Q2H PRN Delray Alt, PA-C      . multivitamin with minerals tablet 1 tablet  1 tablet Oral Daily Delray Alt, PA-C   1 tablet at 03/04/19 0934  . ondansetron (ZOFRAN) injection 4 mg  4 mg Intravenous Once Yacobi, Sarah A, PA-C      . pantoprazole (PROTONIX) EC tablet 40 mg  40 mg Oral QHS Patrecia Pace A, PA-C   40 mg at 03/03/19 2230  . polyethylene glycol (MIRALAX / GLYCOLAX) packet 17 g  17 g Oral Daily Regalado, Belkys A, MD      . rosuvastatin (CRESTOR) tablet 10 mg  10 mg Oral q1800 Patrecia Pace A, PA-C      . timolol (TIMOPTIC) 0.5 % ophthalmic solution 1 drop  1 drop Both Eyes BID Delray Alt, PA-C   1 drop at 03/04/19 9629     Discharge Medications: Please see discharge summary for a list of discharge medications.  Relevant Imaging Results:  Relevant Lab Results:   Additional Information [id number] [place], [place]

## 2019-03-05 ENCOUNTER — Inpatient Hospital Stay (HOSPITAL_COMMUNITY): Payer: Medicare Other

## 2019-03-05 LAB — BASIC METABOLIC PANEL
Anion gap: 10 (ref 5–15)
BUN: 23 mg/dL (ref 8–23)
CO2: 22 mmol/L (ref 22–32)
Calcium: 8.5 mg/dL — ABNORMAL LOW (ref 8.9–10.3)
Chloride: 106 mmol/L (ref 98–111)
Creatinine, Ser: 1.1 mg/dL — ABNORMAL HIGH (ref 0.44–1.00)
GFR calc Af Amer: 52 mL/min — ABNORMAL LOW (ref >60)
GFR calc non Af Amer: 45 mL/min — ABNORMAL LOW (ref >60)
Glucose, Bld: 181 mg/dL — ABNORMAL HIGH (ref 70–99)
Potassium: 4 mmol/L (ref 3.5–5.1)
Sodium: 138 mmol/L (ref 135–145)

## 2019-03-05 LAB — CBC
HCT: 30.4 % — ABNORMAL LOW (ref 36.0–46.0)
Hemoglobin: 9.9 g/dL — ABNORMAL LOW (ref 12.0–15.0)
MCH: 29.2 pg (ref 26.0–34.0)
MCHC: 32.6 g/dL (ref 30.0–36.0)
MCV: 89.7 fL (ref 80.0–100.0)
Platelets: 156 10*3/uL (ref 150–400)
RBC: 3.39 MIL/uL — ABNORMAL LOW (ref 3.87–5.11)
RDW: 14.7 % (ref 11.5–15.5)
WBC: 8.8 10*3/uL (ref 4.0–10.5)
nRBC: 0 % (ref 0.0–0.2)

## 2019-03-05 MED ORDER — ADULT MULTIVITAMIN W/MINERALS CH: 1.0000 | ORAL_TABLET | Freq: Every day | ORAL | 0 refills | Status: DC

## 2019-03-05 MED ORDER — POLYETHYLENE GLYCOL 3350 17 G PO PACK: 17.0000 g | PACK | Freq: Every day | ORAL | 0 refills | Status: DC

## 2019-03-05 MED ORDER — HYDROCODONE-ACETAMINOPHEN 5-325 MG PO TABS: 1.0000 | ORAL_TABLET | Freq: Four times a day (QID) | ORAL | 0 refills | Status: DC | PRN

## 2019-03-05 MED ORDER — ACETAMINOPHEN 325 MG PO TABS: 650.0000 mg | ORAL_TABLET | Freq: Four times a day (QID) | ORAL | 0 refills | Status: DC | PRN

## 2019-03-05 MED ORDER — GLYCERIN (LAXATIVE) 2.1 G RE SUPP: 1.0000 | Freq: Every day | RECTAL | 0 refills | Status: DC | PRN

## 2019-03-05 MED ORDER — ENSURE ENLIVE PO LIQD: 237.0000 mL | Freq: Two times a day (BID) | ORAL | 12 refills | Status: DC

## 2019-03-05 NOTE — Progress Notes (Signed)
Physical Therapy Treatment Patient Details Name: Tina Mercado MRN: [id number] DOB: Jan 06, 1929 Today's Date: 03/05/2019    History of Present Illness Patient is a 85 y/o female who presents with left hip pain s/p fall at home now s/p IM nail left femur. PMH includes HTN, HLD, COPD, glaucoma.     PT Comments    Pt making fair progress with mobility. Remains limited secondary to pain and weakness. Pt would continue to benefit from skilled physical therapy services at this time while admitted and after d/c to address the below listed limitations in order to improve overall safety and independence with functional mobility.  BP in supine - 124/55 mmHg BP in sitting - 147/62 mmHg RN notified.      Follow Up Recommendations  SNF;Supervision for mobility/OOB;Supervision/Assistance - 24 hour     Equipment Recommendations  None recommended by PT    Recommendations for Other Services       Precautions / Restrictions Precautions Precautions: Fall Restrictions Weight Bearing Restrictions: Yes LLE Weight Bearing: Weight bearing as tolerated    Mobility  Bed Mobility Overal bed mobility: Needs Assistance Bed Mobility: Supine to Sit     Supine to sit: Min assist     General bed mobility comments: increased time and effort, assist needed for trunk elevation  Transfers Overall transfer level: Needs assistance Equipment used: Rolling walker (2 wheeled) Transfers: Sit to/from Stand Sit to Stand: Mod assist         General transfer comment: assist for stability and to rise into standing from EOB; cueing for safe hand placement  Ambulation/Gait Ambulation/Gait assistance: Min assist Gait Distance (Feet): 3 Feet Assistive device: Rolling walker (2 wheeled) Gait Pattern/deviations: Step-to pattern;Trunk flexed Gait velocity: Decreased   General Gait Details: Able to take a few steps to get to chair with assist for RW management and assist for weightshifting. Increased time.     Stairs             Wheelchair Mobility    Modified Rankin (Stroke Patients Only)       Balance Overall balance assessment: Needs assistance;History of Falls Sitting-balance support: Feet supported;Single extremity supported Sitting balance-Leahy Scale: Fair     Standing balance support: During functional activity Standing balance-Leahy Scale: Poor Standing balance comment: Requires BUE support in standing with RW.                             Cognition Arousal/Alertness: Awake/alert Behavior During Therapy: WFL for tasks assessed/performed Overall Cognitive Status: Within Functional Limits for tasks assessed                                        Exercises      General Comments        Pertinent Vitals/Pain Pain Assessment: Faces Faces Pain Scale: Hurts even more Pain Location: L LE Pain Descriptors / Indicators: Sore;Discomfort;Grimacing;Guarding;Operative site guarding;Sharp Pain Intervention(s): Monitored during session;Repositioned    Home Living                      Prior Function            PT Goals (current goals can now be found in the care plan section) Acute Rehab PT Goals PT Goal Formulation: With patient Time For Goal Achievement: 03/18/19 Potential to Achieve Goals: Good Progress towards PT goals:  Progressing toward goals    Frequency    Min 3X/week      PT Plan Current plan remains appropriate    Co-evaluation              AM-PAC PT "6 Clicks" Mobility   Outcome Measure  Help needed turning from your back to your side while in a flat bed without using bedrails?: A Little Help needed moving from lying on your back to sitting on the side of a flat bed without using bedrails?: A Little Help needed moving to and from a bed to a chair (including a wheelchair)?: A Little Help needed standing up from a chair using your arms (e.g., wheelchair or bedside chair)?: A Lot Help needed to walk  in hospital room?: A Little Help needed climbing 3-5 steps with a railing? : Total 6 Click Score: 15    End of Session Equipment Utilized During Treatment: Gait belt Activity Tolerance: Patient limited by pain;Patient limited by fatigue Patient left: in chair;with call bell/phone within reach;with chair alarm set Nurse Communication: Mobility status;Patient requests pain meds PT Visit Diagnosis: Pain;Difficulty in walking, not elsewhere classified (R26.2);Unsteadiness on feet (R26.81);History of falling (Z91.81) Pain - Right/Left: Left Pain - part of body: Leg     Time: 2641-5830 PT Time Calculation (min) (ACUTE ONLY): 34 min  Charges:  $Therapeutic Activity: 23-37 mins                     Sherie Don, PT, DPT  Acute Rehabilitation Services Pager [phone] Office [place] 03/05/2019, 4:17 PM

## 2019-03-05 NOTE — Progress Notes (Signed)
Orthopedic Trauma Service Progress Note  Patient ID: Tina Mercado MRN: [id number] DOB/AGE: 83-Apr-1930 83 y.o.  Subjective:  Doing very well this am  Off supplemental o2 Awaiting SNF bed offers  Transferred from bed to chair Wants to ambulate   No CP, no SOB Diminished appetite which is baseline per her report   No other complaints or concerns    ROS As above  Objective:   VITALS:   Vitals:   03/04/19 1352 03/04/19 2008 03/04/19 2030 03/05/19 0551  BP: 116/60 123/70  (!) 152/61  Pulse: 72 79  68  Resp:  16  16  Temp: 98.5 F (36.9 C) 98.3 F (36.8 C)  98.3 F (36.8 C)  TempSrc: Oral Oral  Oral  SpO2: 97% 98% 96%   Weight:      Height:        Estimated body mass index is 22.86 kg/m as calculated from the following:   Height as of this encounter: 5\' 2"  (1.575 m).   Weight as of this encounter: 56.7 kg.   Intake/Output      05/08 0701 - 05/09 0700 05/09 0701 - 05/10 0700   P.O. 640 120   I.V. (mL/kg) 578.8 (10.2)    IV Piggyback     Total Intake(mL/kg) 1218.8 (21.5) 120 (2.1)   Urine (mL/kg/hr) 1075 (0.8)    Blood     Total Output 1075    Net +143.8 +120          LABS  Results for orders placed or performed during the hospital encounter of 03/02/19 (from the past 24 hour(s))  CBC     Status: Abnormal   Collection Time: 03/05/19  8:37 AM  Result Value Ref Range   WBC 8.8 4.0 - 10.5 K/uL   RBC 3.39 (L) 3.87 - 5.11 MIL/uL   Hemoglobin 9.9 (L) 12.0 - 15.0 g/dL   HCT 30.4 (L) 36.0 - 46.0 %   MCV 89.7 80.0 - 100.0 fL   MCH 29.2 26.0 - 34.0 pg   MCHC 32.6 30.0 - 36.0 g/dL   RDW 14.7 11.5 - 15.5 %   Platelets 156 150 - 400 K/uL   nRBC 0.0 0.0 - 0.2 %  Basic metabolic panel     Status: Abnormal   Collection Time: 03/05/19  8:37 AM  Result Value Ref Range   Sodium 138 135 - 145 mmol/L   Potassium 4.0 3.5 - 5.1 mmol/L   Chloride 106 98 - 111 mmol/L   CO2 22 22 - 32 mmol/L    Glucose, Bld 181 (H) 70 - 99 mg/dL   BUN 23 8 - 23 mg/dL   Creatinine, Ser 1.10 (H) 0.44 - 1.00 mg/dL   Calcium 8.5 (L) 8.9 - 10.3 mg/dL   GFR calc non Af Amer 45 (L) >60 mL/min   GFR calc Af Amer 52 (L) >60 mL/min   Anion gap 10 5 - 15     PHYSICAL EXAM:   Gen: resting comfortably in bed, NAD, pleasant  Lungs: anterior fields are clear, unlabored Cardiac: RRR Abd: + BS, NTND Ext:       Left Lower Extremity   Dressings pristine to L hip   Distal motor and sensory functions intact  Ext warm   + DP pulse  Compartments are soft   No  DCT   Mild L knee tenderness    No knee effusion    Assessment/Plan: 2 Days Post-Op   Principal Problem:   Closed left hip fracture, initial encounter [facility]) Active Problems:   Benign hypertensive heart disease without heart failure   COPD mixed type ([place])   Large hiatal hernia   Anti-infectives (From admission, onward)   Start     Dose/Rate Route Frequency Ordered Stop   03/03/19 2300  ceFAZolin (ANCEF) IVPB 2g/100 mL premix     2 g 200 mL/hr over 30 Minutes Intravenous Every 8 hours 03/03/19 1823 03/04/19 1521   03/03/19 1621  vancomycin (VANCOCIN) powder  Status:  Discontinued       As needed 03/03/19 1621 03/03/19 1642   03/03/19 1515  ceFAZolin (ANCEF) IVPB 2g/100 mL premix     2 g 200 mL/hr over 30 Minutes Intravenous On call to O.R. 03/03/19 1426 03/03/19 1514   03/03/19 1427  ceFAZolin (ANCEF) 2-4 GM/100ML-% IVPB    Note to Pharmacy:  [facility]   : cabinet override      03/03/19 1427 03/03/19 1514    .  POD/HD#: 2  85 y/o female s/p fall with L femoral neck/ intertrochanteric femur fracture   -L femoral neck/intertrochanteric femur fracture   WBAT L leg   ROM as tolerated L hip and knee   Dressing changes as needed starting 03/05/2019  Ice prn for swelling and pain control    - Pain management:  Minimal pain med use (opioid and non-opioid)  Continue with current regimen   - ABL anemia/Hemodynamics  Stable    - Medical issues   Per primary   - DVT/PE prophylaxis:  Lovenox 40 mg sq daily x 4 weeks for post op prophylaxis  - ID:   periop abx completed   - Metabolic Bone Disease:  Vitamin d supplementation   Check vitamin d panel   Bone density scan from 10/2017 shows T score of -3.6. all sites tested showed osteoporosis    Looks like pcp was recommending a bisphosphonate but I do not see that this was ever started    Pt will require metabolic bone workup    Insurance will not likely approve a dexa this year but can get one 10/2019   Would recommend anabolic agent or prolia for this pt for her bone health     Pt at increased risk for secondary fracture and associated complications     - Activity:  WBAT L leg   ROM as tolerated  Walker   - FEN/GI prophylaxis/Foley/Lines:  Diet as tolerated   - Impediments to fracture healing:  Osteoporosis   - Dispo:  Therapies  Likely snf  Ortho issues stable  Follow up with Ortho trauma service in 2 weeks   Osteoporosis management    Jari Pigg, PA-C [phone] (C) 03/05/2019, 10:33 AM  [facility] [place] [id number] [phone] Domingo Sep (F)

## 2019-03-05 NOTE — Plan of Care (Signed)
  Problem: Pain Managment: Goal: General experience of comfort will improve Outcome: Progressing   Problem: Safety: Goal: Ability to remain free from injury will improve Outcome: Progressing   Problem: Skin Integrity: Goal: Risk for impaired skin integrity will decrease Outcome: Progressing   

## 2019-03-05 NOTE — Progress Notes (Signed)
PROGRESS NOTE    Tina Mercado  FUX:[id number] DOB: January 07, 1929 DOA: 03/02/2019 PCP: Shawnee Knapp, MD    Brief Narrative; 85 year old with past medical history significant for hypertension, GERD, COPD, balance difficulty with recurrent falls who presented to the emergency department with left hip pain after a fall at home.  Patient denies lightheadedness or presyncopal episode prior to falling.  No loss of consciousness.  She denies chest pain or shortness of breath.  Patient admitted with left hip fracture.   Assessment & Plan:   Principal Problem:   Closed left hip fracture, initial encounter [facility]) Active Problems:   Benign hypertensive heart disease without heart failure   COPD mixed type ([place])   Large hiatal hernia  1-Left hip fracture:  left femoral neck fracture s/p intramedullary nailing on 5/7 Patient presents after a fall at home. CT with left femoral neck fracture. Plan for surgery today. Risk for surgery estimated to be 1% for perioperative MI or cardiac arrest. Avoid the Dilaudid.  PT recommending SNF.  DVT prophylaxis Lovenox.  Weightbearing the left lower extremity.  2-COPD: Stable continue with inhalers. She is currently on 3 L of oxygen, she was placed on oxygen last night after she received Dilaudid.  Oxygen supplementation had decreased to 1 L and her oxygen saturation remained 97%. Taper Oxygen off as needed.  3-Hypertension: PRN hydralazine.  Acute blood loss anemia;  Hb drop to 9. Repeat tomorrow.   Balance difficulty frequent cough PT to evaluate after surgery.  Large Hiatal hernia; continue with PPI  Acute hypoxic respiratory failure: Related to medication.  Discontinue Dilaudid. Oxygen improved now on 1 L down from 3 Taper off oxygen.   Hypotension, orthostatic;  BP improved. Continue with IV fluids,   Repeat orthostatics in the morning  Mild hyponatremia: continue with IV fluids.   Nutrition Problem: Increased nutrient needs  Etiology: post-op healing    Signs/Symptoms: estimated needs    Interventions: Ensure Enlive (each supplement provides 350kcal and 20 grams of protein), MVI  Estimated body mass index is 22.86 kg/m as calculated from the following:   Height as of this encounter: 5\' 2"  (1.575 m).   Weight as of this encounter: 56.7 kg.   DVT prophylaxis: SCDs Code Status: Full code Family Communication: Care discussed with patient and daughter Disposition Plan: keep overnight, need to repeat hb to assure no further drop. Also need to continue with IV fluids due to poor oral intake.   Consultants:   Orthopedic   Procedures:  None   Antimicrobials:   None   Subjective: Patient is alert, denies pain.  She was up in the chair only yesterday with therapist.  No BM, but she has not been eating.    Objective: Vitals:   03/04/19 2008 03/04/19 2030 03/05/19 0551 03/05/19 1433  BP: 123/70  (!) 152/61 (!) 140/52  Pulse: 79  68 74  Resp: 16  16 16   Temp: 98.3 F (36.8 C)  98.3 F (36.8 C) 97.9 F (36.6 C)  TempSrc: Oral  Oral Oral  SpO2: 98% 96%  92%  Weight:      Height:        Intake/Output Summary (Last 24 hours) at 03/05/2019 1601 Last data filed at 03/05/2019 1330 Gross per 24 hour  Intake 1028.79 ml  Output 200 ml  Net 828.79 ml   Filed Weights   03/03/19 1044 03/03/19 1437  Weight: 56.7 kg 56.7 kg    Examination:  General exam: NAD Respiratory system: CTA Cardiovascular  system: S 1, S 2 RRR Gastrointestinal system: BS present, soft, nt Central nervous system: Non focal.  Extremities: dressing left hip Skin: no rashes    Data Reviewed: I have personally reviewed following labs and imaging studies  CBC: Recent Labs  Lab 03/02/19 1835 03/04/19 0115 03/05/19 0837  WBC 8.1 8.9 8.8  NEUTROABS 6.1  --   --   HGB 12.1 11.4* 9.9*  HCT 37.9 34.5* 30.4*  MCV 94.0 87.1 89.7  PLT 237 161 341   Basic Metabolic Panel: Recent Labs  Lab 03/02/19 1835 03/04/19  1008 03/05/19 0837  NA 136 132* 138  K 4.8 3.8 4.0  CL 107 101 106  CO2 20* 20* 22  GLUCOSE 104* 153* 181*  BUN 15 12 23   CREATININE 0.89 1.09* 1.10*  CALCIUM 8.7* 8.5* 8.5*   GFR: Estimated Creatinine Clearance: 28 mL/min (A) (by C-G formula based on SCr of 1.1 mg/dL (H)). Liver Function Tests: No results for input(s): AST, ALT, ALKPHOS, BILITOT, PROT, ALBUMIN in the last 168 hours. No results for input(s): LIPASE, AMYLASE in the last 168 hours. No results for input(s): AMMONIA in the last 168 hours. Coagulation Profile: No results for input(s): INR, PROTIME in the last 168 hours. Cardiac Enzymes: No results for input(s): CKTOTAL, CKMB, CKMBINDEX, TROPONINI in the last 168 hours. BNP (last 3 results) No results for input(s): PROBNP in the last 8760 hours. HbA1C: No results for input(s): HGBA1C in the last 72 hours. CBG: No results for input(s): GLUCAP in the last 168 hours. Lipid Profile: No results for input(s): CHOL, HDL, LDLCALC, TRIG, CHOLHDL, LDLDIRECT in the last 72 hours. Thyroid Function Tests: No results for input(s): TSH, T4TOTAL, FREET4, T3FREE, THYROIDAB in the last 72 hours. Anemia Panel: No results for input(s): VITAMINB12, FOLATE, FERRITIN, TIBC, IRON, RETICCTPCT in the last 72 hours. Sepsis Labs: No results for input(s): PROCALCITON, LATICACIDVEN in the last 168 hours.  Recent Results (from the past 240 hour(s))  MRSA PCR Screening     Status: None   Collection Time: 03/03/19 12:34 AM  Result Value Ref Range Status   MRSA by PCR NEGATIVE NEGATIVE Final    Comment:        The GeneXpert MRSA Assay (FDA approved for NASAL specimens only), is one component of a comprehensive MRSA colonization surveillance program. It is not intended to diagnose MRSA infection nor to guide or monitor treatment for MRSA infections. Performed at [facility], [place] [address], [place], [place] [address]   SARS Coronavirus 2 (CEPHEID - Performed in [facility] lab), Hosp Order     Status: None   Collection Time: 03/03/19  1:20 AM  Result Value Ref Range Status   SARS Coronavirus 2 NEGATIVE NEGATIVE Final    Comment: (NOTE) If result is NEGATIVE SARS-CoV-2 target nucleic acids are NOT DETECTED. The SARS-CoV-2 RNA is generally detectable in upper and lower  respiratory specimens during the acute phase of infection. The lowest  concentration of SARS-CoV-2 viral copies this assay can detect is 250  copies / mL. A negative result does not preclude SARS-CoV-2 infection  and should not be used as the sole basis for treatment or other  patient management decisions.  A negative result may occur with  improper specimen collection / handling, submission of specimen other  than nasopharyngeal swab, presence of viral mutation(s) within the  areas targeted by this assay, and inadequate number of viral copies  (<250 copies / mL). A negative result must be combined  with clinical  observations, patient history, and epidemiological information. If result is POSITIVE SARS-CoV-2 target nucleic acids are DETECTED. The SARS-CoV-2 RNA is generally detectable in upper and lower  respiratory specimens dur ing the acute phase of infection.  Positive  results are indicative of active infection with SARS-CoV-2.  Clinical  correlation with patient history and other diagnostic information is  necessary to determine patient infection status.  Positive results do  not rule out bacterial infection or co-infection with other viruses. If result is PRESUMPTIVE POSTIVE SARS-CoV-2 nucleic acids MAY BE PRESENT.   A presumptive positive result was obtained on the submitted specimen  and confirmed on repeat testing.  While 2019 novel coronavirus  (SARS-CoV-2) nucleic acids may be present in the submitted sample  additional confirmatory testing may be necessary for epidemiological  and / or clinical management purposes  to differentiate between  SARS-CoV-2 and other  Sarbecovirus currently known to infect humans.  If clinically indicated additional testing with an alternate test  methodology [phone]) is advised. The SARS-CoV-2 RNA is generally  detectable in upper and lower respiratory sp ecimens during the acute  phase of infection. The expected result is Negative. Fact Sheet for Patients:  StrictlyIdeas.no Fact Sheet for Healthcare Providers: BankingDealers.co.za This test is not yet approved or cleared by the [place] FDA and has been authorized for detection and/or diagnosis of SARS-CoV-2 by FDA under an Emergency Use Authorization (EUA).  This EUA will remain in effect (meaning this test can be used) for the duration of the COVID-19 declaration under Section 564(b)(1) of the Act, 21 U.S.C. section 360bbb-3(b)(1), unless the authorization is terminated or revoked sooner. Performed at [facility], [place] [address], [place], [place] [address]          Radiology Studies: Dg Knee Complete 4 Views Left  Result Date: 03/05/2019 CLINICAL DATA:  Acute LEFT knee pain following fall several days ago. Initial encounter. EXAM: LEFT KNEE - COMPLETE 4+ VIEW COMPARISON:  None. FINDINGS: A small knee effusion is present. There is no evidence of acute fracture, subluxation or dislocation. No focal bony lesions are identified. IMPRESSION: Small knee effusion without acute bony abnormality. Electronically Signed   By: Margarette Canada M.D.   On: 03/05/2019 13:08   Dg C-arm 1-60 Min  Result Date: 03/03/2019 CLINICAL DATA:  IM left femoral nailing. EXAM: DG C-ARM 61-120 MIN; OPERATIVE LEFT HIP WITH PELVIS FLUOROSCOPY TIME:  2 minutes, 17 seconds COMPARISON:  Left hip radiographs-earlier same day FINDINGS: Seven spot intraoperative fluoroscopic images of the left hip are provided for review. Images demonstrate the sequela short-segment intramedullary rod nailing of the left femur and dynamic screw fixation of the  left femoral neck. The distal end of the femoral rod is transfixed with a single cancellous screw. There is a minimal amount of subcutaneous emphysema about the operative site. No radiopaque foreign body. IMPRESSION: Post ORIF of the proximal left femur and femoral neck without evidence of complication. Electronically Signed   By: Sandi Mariscal M.D.   On: 03/03/2019 16:40   Dg Hip Operative Unilat W Or W/o Pelvis Left  Result Date: 03/03/2019 CLINICAL DATA:  IM left femoral nailing. EXAM: DG C-ARM 61-120 MIN; OPERATIVE LEFT HIP WITH PELVIS FLUOROSCOPY TIME:  2 minutes, 17 seconds COMPARISON:  Left hip radiographs-earlier same day FINDINGS: Seven spot intraoperative fluoroscopic images of the left hip are provided for review. Images demonstrate the sequela short-segment intramedullary rod nailing of the left femur and dynamic screw fixation of the left  femoral neck. The distal end of the femoral rod is transfixed with a single cancellous screw. There is a minimal amount of subcutaneous emphysema about the operative site. No radiopaque foreign body. IMPRESSION: Post ORIF of the proximal left femur and femoral neck without evidence of complication. Electronically Signed   By: Sandi Mariscal M.D.   On: 03/03/2019 16:40   Dg Hip Unilat W Or W/o Pelvis 2-3 Views Left  Result Date: 03/03/2019 CLINICAL DATA:  Status post surgical internal fixation of left femoral neck fracture. EXAM: DG HIP (WITH OR WITHOUT PELVIS) 2-3V LEFT COMPARISON:  None. FINDINGS: Status post surgical internal fixation of left femoral neck fracture. Good alignment of fracture components is noted. Expected postoperative changes are noted in the surrounding soft tissues. IMPRESSION: Status post surgical internal fixation of left femoral neck fracture. Electronically Signed   By: Marijo Conception M.D.   On: 03/03/2019 17:22        Scheduled Meds: . amitriptyline  10 mg Oral QHS  . cholecalciferol  4,000 Units Oral Daily  . enoxaparin (LOVENOX)  injection  30 mg Subcutaneous Q24H  . feeding supplement (ENSURE ENLIVE)  237 mL Oral BID BM  . ketorolac  1 drop Right Eye BID  . mometasone-formoterol  2 puff Inhalation BID  . multivitamin with minerals  1 tablet Oral Daily  . ondansetron (ZOFRAN) IV  4 mg Intravenous Once  . pantoprazole  40 mg Oral QHS  . polyethylene glycol  17 g Oral Daily  . rosuvastatin  10 mg Oral q1800  . timolol  1 drop Both Eyes BID   Continuous Infusions: . sodium chloride 50 mL/hr at 03/05/19 0300  . lactated ringers 10 mL/hr at 03/03/19 1429     LOS: 3 days    Time spent: 35 minutes.     Elmarie Shiley, MD [facility] Pager [phone]  If 7PM-7AM, please contact night-coverage [email or web address] Password [email or web address] 03/05/2019, 4:01 PM

## 2019-03-05 NOTE — Progress Notes (Signed)
RN contacted CSW and relayed patient is medically stable for discharge to facility. Contacted patient's daughter and left voicemail for callback. Will need daughter's assistance in choosing SNF placement for short-term rehab. Will continue to assist as needed.

## 2019-03-06 ENCOUNTER — Inpatient Hospital Stay (HOSPITAL_COMMUNITY): Payer: Medicare Other

## 2019-03-06 LAB — URINALYSIS, ROUTINE W REFLEX MICROSCOPIC
Bilirubin Urine: NEGATIVE
Glucose, UA: NEGATIVE mg/dL
Hgb urine dipstick: NEGATIVE
Ketones, ur: NEGATIVE mg/dL
Leukocytes,Ua: NEGATIVE
Nitrite: NEGATIVE
Protein, ur: NEGATIVE mg/dL
Specific Gravity, Urine: 1.012 (ref 1.005–1.030)
pH: 5 (ref 5.0–8.0)

## 2019-03-06 LAB — MAGNESIUM: Magnesium: 2 mg/dL (ref 1.7–2.4)

## 2019-03-06 LAB — COMPREHENSIVE METABOLIC PANEL
ALT: 7 U/L (ref 0–44)
AST: 14 U/L — ABNORMAL LOW (ref 15–41)
Albumin: 2.8 g/dL — ABNORMAL LOW (ref 3.5–5.0)
Alkaline Phosphatase: 85 U/L (ref 38–126)
Anion gap: 9 (ref 5–15)
BUN: 22 mg/dL (ref 8–23)
CO2: 22 mmol/L (ref 22–32)
Calcium: 8.7 mg/dL — ABNORMAL LOW (ref 8.9–10.3)
Chloride: 105 mmol/L (ref 98–111)
Creatinine, Ser: 1.16 mg/dL — ABNORMAL HIGH (ref 0.44–1.00)
GFR calc Af Amer: 49 mL/min — ABNORMAL LOW (ref >60)
GFR calc non Af Amer: 42 mL/min — ABNORMAL LOW (ref >60)
Glucose, Bld: 126 mg/dL — ABNORMAL HIGH (ref 70–99)
Potassium: 4.2 mmol/L (ref 3.5–5.1)
Sodium: 136 mmol/L (ref 135–145)
Total Bilirubin: 0.9 mg/dL (ref 0.3–1.2)
Total Protein: 5.9 g/dL — ABNORMAL LOW (ref 6.5–8.1)

## 2019-03-06 LAB — CBC
HCT: 31.7 % — ABNORMAL LOW (ref 36.0–46.0)
Hemoglobin: 11 g/dL — ABNORMAL LOW (ref 12.0–15.0)
MCH: 30.7 pg (ref 26.0–34.0)
MCHC: 34.7 g/dL (ref 30.0–36.0)
MCV: 88.5 fL (ref 80.0–100.0)
Platelets: 188 10*3/uL (ref 150–400)
RBC: 3.58 MIL/uL — ABNORMAL LOW (ref 3.87–5.11)
RDW: 14.9 % (ref 11.5–15.5)
WBC: 9.4 10*3/uL (ref 4.0–10.5)
nRBC: 0 % (ref 0.0–0.2)

## 2019-03-06 LAB — T4, FREE: Free T4: 1.53 ng/dL (ref 0.82–1.77)

## 2019-03-06 LAB — PREALBUMIN: Prealbumin: 15.6 mg/dL — ABNORMAL LOW (ref 18–38)

## 2019-03-06 LAB — PHOSPHORUS: Phosphorus: 2.5 mg/dL (ref 2.5–4.6)

## 2019-03-06 LAB — TSH: TSH: 6.435 u[IU]/mL — ABNORMAL HIGH (ref 0.350–4.500)

## 2019-03-06 NOTE — Progress Notes (Addendum)
PROGRESS NOTE    Tina Mercado  GYJ:[id number] DOB: [birth year] DOA: 03/02/2019 PCP: Shawnee Knapp, MD    Brief Narrative; 85 year old with past medical history significant for hypertension, GERD, COPD, balance difficulty with recurrent falls who presented to the emergency department with left hip pain after a fall at home.  Patient denies lightheadedness or presyncopal episode prior to falling.  No loss of consciousness.  She denies chest pain or shortness of breath.  Patient admitted with left hip fracture.   Assessment & Plan:   Principal Problem:   Closed left hip fracture, initial encounter [facility]) Active Problems:   Benign hypertensive heart disease without heart failure   COPD mixed type ([place])   Large hiatal hernia  1-Left hip fracture:  left femoral neck fracture s/p intramedullary nailing on 5/7 Patient presents after a fall at home. CT with left femoral neck fracture. Plan for surgery today. Risk for surgery estimated to be 1% for perioperative MI or cardiac arrest. Avoid the Dilaudid.  PT recommending SNF.  DVT prophylaxis Lovenox.  Weightbearing the left lower extremity. Patient complaining of severe pain left thigh, hip. She felt a pup when she was going to bedside commode. Will repeat xray, pain management. Discussed with Ortho/   2-COPD: Stable continue with inhalers. She is currently on 3 L of oxygen, she was placed on oxygen last night after she received Dilaudid.  Oxygen supplementation had decreased to 1 L and her oxygen saturation remained 97%. Taper Oxygen off as needed.  3-Hypertension: PRN hydralazine.  Acute blood loss anemia;  Hb drop to 9. Repeated hb stable increase to 11.   Balance difficulty frequent cough PT to evaluate after surgery.  Large Hiatal hernia; continue with PPI  Acute hypoxic respiratory failure: Related to medication.  Discontinue Dilaudid. Oxygen improved now on 1 L down from 3 Taper off oxygen.   Hypotension,  orthostatic;  BP improved. Continue with IV fluids,  Orthostatic improved.    Mild hyponatremia: continue with IV fluids.   Nutrition Problem: Increased nutrient needs Etiology: post-op healing    Signs/Symptoms: estimated needs    Interventions: Ensure Enlive (each supplement provides 350kcal and 20 grams of protein), MVI  Estimated body mass index is 22.86 kg/m as calculated from the following:   Height as of this encounter: 5\' 2"  (1.575 m).   Weight as of this encounter: 56.7 kg.   DVT prophylaxis: SCDs Code Status: Full code Family Communication: Care discussed with patient and daughter Disposition Plan: Worsening hip pain, will need to repeat x ray and pain management.   Consultants:   Orthopedic   Procedures:  None   Antimicrobials:   None   Subjective: She is complaining of severe pain hip and thing when she walk to bathroom today she felt a pop.    Objective: Vitals:   03/05/19 2039 03/05/19 2055 03/06/19 0436 03/06/19 0843  BP:  138/60 (!) 163/53   Pulse:  80 68   Resp:  17 18   Temp:  98.6 F (37 C) 98.3 F (36.8 C)   TempSrc:  Oral Oral   SpO2: 93% 98% 96% 90%  Weight:      Height:        Intake/Output Summary (Last 24 hours) at 03/06/2019 1029 Last data filed at 03/05/2019 1330 Gross per 24 hour  Intake 150 ml  Output -  Net 150 ml   Filed Weights   03/03/19 1044 03/03/19 1437  Weight: 56.7 kg 56.7 kg    Examination:  General exam: NAD Respiratory system: CTA Cardiovascular system: S 1, S 2 RRR Gastrointestinal system: BS present, soft, nt Central nervous system:  Non focal.  Extremities: clean dressing left hip Skin: no rashes    Data Reviewed: I have personally reviewed following labs and imaging studies  CBC: Recent Labs  Lab 03/02/19 1835 03/04/19 0115 03/05/19 0837 03/06/19 0224  WBC 8.1 8.9 8.8 9.4  NEUTROABS 6.1  --   --   --   HGB 12.1 11.4* 9.9* 11.0*  HCT 37.9 34.5* 30.4* 31.7*  MCV 94.0 87.1 89.7  88.5  PLT 237 161 156 092   Basic Metabolic Panel: Recent Labs  Lab 03/02/19 1835 03/04/19 1008 03/05/19 0837 03/06/19 0224  NA 136 132* 138 136  K 4.8 3.8 4.0 4.2  CL 107 101 106 105  CO2 20* 20* 22 22  GLUCOSE 104* 153* 181* 126*  BUN 15 12 23 22   CREATININE 0.89 1.09* 1.10* 1.16*  CALCIUM 8.7* 8.5* 8.5* 8.7*  MG  --   --   --  2.0  PHOS  --   --   --  2.5   GFR: Estimated Creatinine Clearance: 26.5 mL/min (A) (by C-G formula based on SCr of 1.16 mg/dL (H)). Liver Function Tests: Recent Labs  Lab 03/06/19 0224  AST 14*  ALT 7  ALKPHOS 85  BILITOT 0.9  PROT 5.9*  ALBUMIN 2.8*   No results for input(s): LIPASE, AMYLASE in the last 168 hours. No results for input(s): AMMONIA in the last 168 hours. Coagulation Profile: No results for input(s): INR, PROTIME in the last 168 hours. Cardiac Enzymes: No results for input(s): CKTOTAL, CKMB, CKMBINDEX, TROPONINI in the last 168 hours. BNP (last 3 results) No results for input(s): PROBNP in the last 8760 hours. HbA1C: No results for input(s): HGBA1C in the last 72 hours. CBG: No results for input(s): GLUCAP in the last 168 hours. Lipid Profile: No results for input(s): CHOL, HDL, LDLCALC, TRIG, CHOLHDL, LDLDIRECT in the last 72 hours. Thyroid Function Tests: Recent Labs    03/06/19 0224 03/06/19 0729  TSH 6.435*  --   FREET4  --  1.53   Anemia Panel: No results for input(s): VITAMINB12, FOLATE, FERRITIN, TIBC, IRON, RETICCTPCT in the last 72 hours. Sepsis Labs: No results for input(s): PROCALCITON, LATICACIDVEN in the last 168 hours.  Recent Results (from the past 240 hour(s))  MRSA PCR Screening     Status: None   Collection Time: 03/03/19 12:34 AM  Result Value Ref Range Status   MRSA by PCR NEGATIVE NEGATIVE Final    Comment:        The GeneXpert MRSA Assay (FDA approved for NASAL specimens only), is one component of a comprehensive MRSA colonization surveillance program. It is not intended to diagnose  MRSA infection nor to guide or monitor treatment for MRSA infections. Performed at [facility], [place] [address] [place], [place] [address]   SARS Coronavirus 2 (CEPHEID - Performed in [place] hospital lab), Hosp Order     Status: None   Collection Time: 03/03/19  1:20 AM  Result Value Ref Range Status   SARS Coronavirus 2 NEGATIVE NEGATIVE Final    Comment: (NOTE) If result is NEGATIVE SARS-CoV-2 target nucleic acids are NOT DETECTED. The SARS-CoV-2 RNA is generally detectable in upper and lower  respiratory specimens during the acute phase of infection. The lowest  concentration of SARS-CoV-2 viral copies this assay can detect is 250  copies / mL. A negative result  does not preclude SARS-CoV-2 infection  and should not be used as the sole basis for treatment or other  patient management decisions.  A negative result may occur with  improper specimen collection / handling, submission of specimen other  than nasopharyngeal swab, presence of viral mutation(s) within the  areas targeted by this assay, and inadequate number of viral copies  (<250 copies / mL). A negative result must be combined with clinical  observations, patient history, and epidemiological information. If result is POSITIVE SARS-CoV-2 target nucleic acids are DETECTED. The SARS-CoV-2 RNA is generally detectable in upper and lower  respiratory specimens dur ing the acute phase of infection.  Positive  results are indicative of active infection with SARS-CoV-2.  Clinical  correlation with patient history and other diagnostic information is  necessary to determine patient infection status.  Positive results do  not rule out bacterial infection or co-infection with other viruses. If result is PRESUMPTIVE POSTIVE SARS-CoV-2 nucleic acids MAY BE PRESENT.   A presumptive positive result was obtained on the submitted specimen  and confirmed on repeat testing.  While 2019 novel coronavirus  (SARS-CoV-2) nucleic  acids may be present in the submitted sample  additional confirmatory testing may be necessary for epidemiological  and / or clinical management purposes  to differentiate between  SARS-CoV-2 and other Sarbecovirus currently known to infect humans.  If clinically indicated additional testing with an alternate test  methodology [phone]) is advised. The SARS-CoV-2 RNA is generally  detectable in upper and lower respiratory sp ecimens during the acute  phase of infection. The expected result is Negative. Fact Sheet for Patients:  StrictlyIdeas.no Fact Sheet for Healthcare Providers: BankingDealers.co.za This test is not yet approved or cleared by the [place] FDA and has been authorized for detection and/or diagnosis of SARS-CoV-2 by FDA under an Emergency Use Authorization (EUA).  This EUA will remain in effect (meaning this test can be used) for the duration of the COVID-19 declaration under Section 564(b)(1) of the Act, 21 U.S.C. section 360bbb-3(b)(1), unless the authorization is terminated or revoked sooner. Performed at [facility], [place] [address] [place], [place] [address]          Radiology Studies: Dg Knee Complete 4 Views Left  Result Date: 03/05/2019 CLINICAL DATA:  Acute LEFT knee pain following fall several days ago. Initial encounter. EXAM: LEFT KNEE - COMPLETE 4+ VIEW COMPARISON:  None. FINDINGS: A small knee effusion is present. There is no evidence of acute fracture, subluxation or dislocation. No focal bony lesions are identified. IMPRESSION: Small knee effusion without acute bony abnormality. Electronically Signed   By: Margarette Canada M.D.   On: 03/05/2019 13:08        Scheduled Meds: . amitriptyline  10 mg Oral QHS  . cholecalciferol  4,000 Units Oral Daily  . enoxaparin (LOVENOX) injection  30 mg Subcutaneous Q24H  . feeding supplement (ENSURE ENLIVE)  237 mL Oral BID BM  . ketorolac  1 drop Right  Eye BID  . mometasone-formoterol  2 puff Inhalation BID  . multivitamin with minerals  1 tablet Oral Daily  . ondansetron (ZOFRAN) IV  4 mg Intravenous Once  . pantoprazole  40 mg Oral QHS  . polyethylene glycol  17 g Oral Daily  . rosuvastatin  10 mg Oral q1800  . timolol  1 drop Both Eyes BID   Continuous Infusions: . lactated ringers 10 mL/hr at 03/03/19 1429     LOS: 4 days    Time spent:  35 minutes.     Elmarie Shiley, MD [facility] Pager [phone]  If 7PM-7AM, please contact night-coverage [email or web address] Password [email or web address] 03/06/2019, 10:29 AM

## 2019-03-06 NOTE — Progress Notes (Signed)
I was called and notified patient had a pop and pain at the L hip today. Xray was ordered.  I have reviewed xray and fracture fixation is stable.   Plan: Continue WBAT PT  moblize and dispo   Renette Butters

## 2019-03-06 NOTE — Progress Notes (Signed)
Daughter called inquiring a call she received today, Md txted to return the call. Daughter very concerned about the plan for discharge claiming patient is not ready for discharge. Will endorse to incoming shift regarding daughters concern.

## 2019-03-07 DIAGNOSIS — R262 Difficulty in walking, not elsewhere classified: Secondary | ICD-10-CM | POA: Diagnosis not present

## 2019-03-07 DIAGNOSIS — J449 Chronic obstructive pulmonary disease, unspecified: Secondary | ICD-10-CM | POA: Diagnosis not present

## 2019-03-07 DIAGNOSIS — D649 Anemia, unspecified: Secondary | ICD-10-CM | POA: Diagnosis not present

## 2019-03-07 DIAGNOSIS — S72002D Fracture of unspecified part of neck of left femur, subsequent encounter for closed fracture with routine healing: Secondary | ICD-10-CM | POA: Diagnosis not present

## 2019-03-07 DIAGNOSIS — R5381 Other malaise: Secondary | ICD-10-CM | POA: Diagnosis not present

## 2019-03-07 DIAGNOSIS — H019 Unspecified inflammation of eyelid: Secondary | ICD-10-CM | POA: Diagnosis not present

## 2019-03-07 DIAGNOSIS — L299 Pruritus, unspecified: Secondary | ICD-10-CM | POA: Diagnosis not present

## 2019-03-07 DIAGNOSIS — E569 Vitamin deficiency, unspecified: Secondary | ICD-10-CM | POA: Diagnosis not present

## 2019-03-07 DIAGNOSIS — M6281 Muscle weakness (generalized): Secondary | ICD-10-CM | POA: Diagnosis not present

## 2019-03-07 DIAGNOSIS — S7292XD Unspecified fracture of left femur, subsequent encounter for closed fracture with routine healing: Secondary | ICD-10-CM | POA: Diagnosis not present

## 2019-03-07 DIAGNOSIS — E785 Hyperlipidemia, unspecified: Secondary | ICD-10-CM | POA: Diagnosis not present

## 2019-03-07 DIAGNOSIS — Z111 Encounter for screening for respiratory tuberculosis: Secondary | ICD-10-CM | POA: Diagnosis not present

## 2019-03-07 DIAGNOSIS — Z7401 Bed confinement status: Secondary | ICD-10-CM | POA: Diagnosis not present

## 2019-03-07 DIAGNOSIS — K449 Diaphragmatic hernia without obstruction or gangrene: Secondary | ICD-10-CM | POA: Diagnosis not present

## 2019-03-07 DIAGNOSIS — I951 Orthostatic hypotension: Secondary | ICD-10-CM | POA: Diagnosis not present

## 2019-03-07 DIAGNOSIS — M2550 Pain in unspecified joint: Secondary | ICD-10-CM | POA: Diagnosis not present

## 2019-03-07 DIAGNOSIS — M80052D Age-related osteoporosis with current pathological fracture, left femur, subsequent encounter for fracture with routine healing: Secondary | ICD-10-CM | POA: Diagnosis not present

## 2019-03-07 DIAGNOSIS — R2689 Other abnormalities of gait and mobility: Secondary | ICD-10-CM | POA: Diagnosis not present

## 2019-03-07 DIAGNOSIS — J9601 Acute respiratory failure with hypoxia: Secondary | ICD-10-CM | POA: Diagnosis not present

## 2019-03-07 DIAGNOSIS — K219 Gastro-esophageal reflux disease without esophagitis: Secondary | ICD-10-CM | POA: Diagnosis not present

## 2019-03-07 DIAGNOSIS — R682 Dry mouth, unspecified: Secondary | ICD-10-CM | POA: Diagnosis not present

## 2019-03-07 DIAGNOSIS — R52 Pain, unspecified: Secondary | ICD-10-CM | POA: Diagnosis not present

## 2019-03-07 DIAGNOSIS — S72002A Fracture of unspecified part of neck of left femur, initial encounter for closed fracture: Secondary | ICD-10-CM | POA: Diagnosis not present

## 2019-03-07 DIAGNOSIS — H40059 Ocular hypertension, unspecified eye: Secondary | ICD-10-CM | POA: Diagnosis not present

## 2019-03-07 DIAGNOSIS — E559 Vitamin D deficiency, unspecified: Secondary | ICD-10-CM | POA: Diagnosis not present

## 2019-03-07 DIAGNOSIS — H40052 Ocular hypertension, left eye: Secondary | ICD-10-CM | POA: Diagnosis not present

## 2019-03-07 DIAGNOSIS — S79929A Unspecified injury of unspecified thigh, initial encounter: Secondary | ICD-10-CM | POA: Diagnosis not present

## 2019-03-07 DIAGNOSIS — K5900 Constipation, unspecified: Secondary | ICD-10-CM | POA: Diagnosis not present

## 2019-03-07 DIAGNOSIS — G8911 Acute pain due to trauma: Secondary | ICD-10-CM | POA: Diagnosis not present

## 2019-03-07 DIAGNOSIS — I10 Essential (primary) hypertension: Secondary | ICD-10-CM | POA: Diagnosis not present

## 2019-03-07 LAB — PTH, INTACT AND CALCIUM
Calcium, Total (PTH): 8.7 mg/dL (ref 8.7–10.3)
PTH: 46 pg/mL (ref 15–65)

## 2019-03-07 LAB — CALCITRIOL (1,25 DI-OH VIT D): Vit D, 1,25-Dihydroxy: 59.2 pg/mL (ref 19.9–79.3)

## 2019-03-07 LAB — CALCIUM, IONIZED: Calcium, Ionized, Serum: 4.8 mg/dL (ref 4.5–5.6)

## 2019-03-07 LAB — VITAMIN D 25 HYDROXY (VIT D DEFICIENCY, FRACTURES): Vit D, 25-Hydroxy: 37.6 ng/mL (ref 30.0–100.0)

## 2019-03-07 LAB — T3, FREE: T3, Free: 2.9 pg/mL (ref 2.0–4.4)

## 2019-03-07 MED ORDER — GLYCERIN (LAXATIVE) 2.1 G RE SUPP
1.0000 | Freq: Once | RECTAL | Status: DC
  Filled 2019-03-07: qty 1

## 2019-03-07 MED ORDER — ENOXAPARIN SODIUM 30 MG/0.3ML ~~LOC~~ SOLN: 30.0000 mg | SUBCUTANEOUS | 0 refills | Status: DC

## 2019-03-07 NOTE — Discharge Summary (Signed)
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 last 168 hours. D-Dimer No results for input(s): DDIMER in the last 72 hours. Hgb A1c No results for input(s): HGBA1C in the last 72 hours. Lipid Profile No results for input(s): CHOL, HDL, LDLCALC, TRIG, CHOLHDL, LDLDIRECT in the last 72 hours. Thyroid function studies Recent Labs    03/06/19 0224 03/06/19 0729  TSH 6.435*  --   T3FREE  --  2.9   Anemia work up No results for input(s): VITAMINB12, FOLATE, FERRITIN, TIBC, IRON, RETICCTPCT in the last 72 hours. Urinalysis    Component Value Date/Time   COLORURINE YELLOW 03/06/2019 0221   APPEARANCEUR CLEAR 03/06/2019 0221   LABSPEC 1.012 03/06/2019 0221   PHURINE 5.0 03/06/2019 0221   GLUCOSEU NEGATIVE 03/06/2019 0221   HGBUR NEGATIVE 03/06/2019 0221   BILIRUBINUR NEGATIVE 03/06/2019 0221   BILIRUBINUR negative 10/15/2018 1307   KETONESUR NEGATIVE 03/06/2019 0221    PROTEINUR NEGATIVE 03/06/2019 0221   UROBILINOGEN 0.2 10/15/2018 1307   NITRITE NEGATIVE 03/06/2019 0221   LEUKOCYTESUR  NEGATIVE 03/06/2019 0221   Sepsis Labs Invalid input(s): PROCALCITONIN,  WBC,  LACTICIDVEN Microbiology Recent Results (from the past 240 hour(s))  MRSA PCR Screening     Status: None   Collection Time: 03/03/19 12:34 AM  Result Value Ref Range Status   MRSA by PCR NEGATIVE NEGATIVE Final    Comment:        The GeneXpert MRSA Assay (FDA approved for NASAL specimens only), is one component of a comprehensive MRSA colonization surveillance program. It is not intended to diagnose MRSA infection nor to guide or monitor treatment for MRSA infections. Performed at [facility], [place] [address], [place], [place] [address]   SARS Coronavirus 2 (CEPHEID - Performed in [place] hospital lab), Hosp Order     Status: None   Collection Time: 03/03/19  1:20 AM  Result Value Ref Range Status   SARS Coronavirus 2 NEGATIVE NEGATIVE Final    Comment: (NOTE) If result is NEGATIVE SARS-CoV-2 target nucleic acids are NOT DETECTED. The SARS-CoV-2 RNA is generally detectable in upper and lower  respiratory specimens during the acute phase of infection. The lowest  concentration of SARS-CoV-2 viral copies this assay can detect is 250  copies / mL. A negative result does not preclude SARS-CoV-2 infection  and should not be used as the sole basis for treatment or other  patient management decisions.  A negative result may occur with  improper specimen collection / handling, submission of specimen other  than nasopharyngeal swab, presence of viral mutation(s) within the  areas targeted by this assay, and inadequate number of viral copies  (<250 copies / mL). A negative result must be combined with clinical  observations, patient history, and epidemiological information. If result is POSITIVE SARS-CoV-2 target nucleic acids are DETECTED. The SARS-CoV-2 RNA is generally  detectable in upper and lower  respiratory specimens dur ing the acute phase of infection.  Positive  results are indicative of active infection with SARS-CoV-2.  Clinical  correlation with patient history and other diagnostic information is  necessary to determine patient infection status.  Positive results do  not rule out bacterial infection or co-infection with other viruses. If result is PRESUMPTIVE POSTIVE SARS-CoV-2 nucleic acids MAY BE PRESENT.   A presumptive positive result was obtained on the submitted specimen  and confirmed on repeat testing.  While 2019 novel coronavirus  (SARS-CoV-2) nucleic acids may be present in the submitted sample  additional confirmatory testing may be necessary for epidemiological  and / or clinical management purposes  to differentiate between  SARS-CoV-2 and other Sarbecovirus currently known to infect humans.  If clinically indicated additional testing with an alternate test  methodology [phone]) is advised. The SARS-CoV-2 RNA is generally  detectable in upper and lower respiratory sp ecimens during the acute  phase of infection. The expected result is Negative. Fact Sheet for Patients:  StrictlyIdeas.no Fact Sheet for Healthcare Providers: BankingDealers.co.za This test is not yet approved or cleared by the [place] FDA and has been authorized for detection and/or diagnosis of SARS-CoV-2 by FDA under an Emergency Use Authorization (EUA).  This EUA will remain in effect (meaning this test can be used) for the duration of the COVID-19 declaration under Section 564(b)(1) of the Act, 21 U.S.C. section 360bbb-3(b)(1), unless the authorization is terminated or revoked sooner. Performed at [facility], [place] [address], [place], [place] [address]      Time coordinating discharge: 40 minutes  SIGNED:   Elmarie Shiley, MD  [facility]

## 2019-03-07 NOTE — Progress Notes (Signed)
Report given to Lattie Haw, Conservation officer, historic buildings, at [facility].

## 2019-03-07 NOTE — Plan of Care (Signed)
  Problem: Activity: Goal: Risk for activity intolerance will decrease Outcome: Progressing   Problem: Elimination: Goal: Will not experience complications related to bowel motility Outcome: Progressing   Problem: Pain Managment: Goal: General experience of comfort will improve Outcome: Progressing   

## 2019-03-07 NOTE — Progress Notes (Signed)
Called and left a message for daughter that pt would be going to [facility] around 2 pm and that I had given report to [place] at [place].

## 2019-03-07 NOTE — Progress Notes (Addendum)
Orthopaedic Trauma Progress Note  S: Patient doing well this morning, pain currently well controlled. Had increase in pain and felt a pop over the lateral thigh while ambulating yesterday. Repeat imaging of hip showed possible small avulsion fracture of lesser trochanter, but no other adverse features. Has been able to transfer from bed to chair with PT, feels weak when doing so. Otherwise doing well with no other complaints. Continues to await SNF placement.   O:  Vitals:   03/06/19 2017 03/07/19 0446  BP:  (!) 171/60  Pulse:  72  Resp:  18  Temp:  98 F (36.7 C)  SpO2: 96% 94%    General - Resting comfortably in bed, NAD, alert and oriented x 3. Pleasant and cooperative Left Lower Extremity - Dressings removed, incisions are clean, dry, intact. Minimal bruising or swelling noted to the leg. Mild tenderness to palpation of lateral thigh, otherwise non-tender to palpation of hip or knee. Knee flexion without significant discomfort, describes more of a pulling sensation . Motor and sensory function intact. +DP pulse  Imaging: Repeat imaging on 03/06/19 showed possible small avulsion fracture of lesser trochanter. Otherwise, no adverse features.  Labs:  Results for orders placed or performed during the hospital encounter of 03/02/19 (from the past 24 hour(s))  T3, free     Status: None   Collection Time: 03/06/19  7:29 AM  Result Value Ref Range   T3, Free 2.9 2.0 - 4.4 pg/mL  T4, free     Status: None   Collection Time: 03/06/19  7:29 AM  Result Value Ref Range   Free T4 1.53 0.82 - 1.77 ng/dL    Assessment: 85 year old s/p ground level fall  Injuries: Left femoral neck fracture s/p intramedullary nailing on 03/03/19  Weightbearing: WBAT LLE  Insicional and dressing care: Dressings clean, dry, intact.   Orthopedic device(s): None  CV/Blood loss: Hgb 11.0 yesterday AM, hemodynamically stable  Pain management:  1. Tylenol 650 mg q 6 hours PRN 2. Robaxin 500 mg q 6 hours PRN 3.  Hydrocodone 5/325 q 6 hours PRN 4. Morphine 1-2 mg q 2 hours PRN  VTE prophylaxis: Lovenox 30 mg daily   ID: Ancef 2gm post op completed  Foley/Lines: No foley, KVO IVFs  Medical co-morbidities: HTN, GERD, COPD, Hyperlipidemia, Osteoporosis, Mitral regurg  Impediments to Fracture Healing: Osteoporosis  Dispo: PT eval, recommedning SNF. Okay for discharge from orthopaedic perspective once cleared by therapy and medical team  Follow - up plan: 2 weeks   Tina Mercado A. Tina Mercado [facility] ?[phone]? (phone)

## 2019-03-07 NOTE — TOC Transition Note (Signed)
Transition of Care [facility]) - CM/SW Discharge Note   Patient Details  Name: Tina Mercado MRN: [id number] Date of Birth: [birth year]  [facility] CM/SW Contact:  Alexander Mt, [place] Phone Number: 03/07/2019, 11:15 AM   Clinical Narrative:    Pt aware she has a bed at [facility]. Pt will alert daughter.DC summary sent and need script signed. Will arrange PTAR after lunch.   Final next level of care: Skilled Nursing Facility Barriers to Discharge: Barriers Resolved   Patient Goals and CMS Choice Patient states their goals for this hospitalization and ongoing recovery are:: Pt would like to go to rehab so that she is able to return home CMS Medicare.gov Compare Post Acute Care list provided to:: Patient Choice offered to / list presented to : Patient  Discharge Placement PASRR number recieved: 03/04/19            Patient chooses bed at: [facility] Patient to be transferred to facility by: [facility] Name of family member notified: pt states she will call her daughter Patient and family notified of of transfer: 03/07/19  Discharge Plan and Services In-house Referral: Clinical Social Work Discharge Planning Services: NA Post Acute Care Choice: [place]          DME Arranged: N/A DME Agency: NA       HH Arranged: NA HH Agency: NA        Social Determinants of Health ([place]) Interventions     Readmission Risk Interventions Readmission Risk Prevention Plan 03/04/2019  Post Dischage Appt Not Complete  Medication Screening Complete  Transportation Screening Complete  Some recent data might be hidden

## 2019-03-07 NOTE — Progress Notes (Signed)
Pt for discharge today at 1400 to [facility].  Pt understands.

## 2019-03-07 NOTE — Progress Notes (Signed)
Physical Therapy Treatment Patient Details Name: Tina Mercado MRN: [id number] DOB: [birth year] Today's Date: 03/07/2019    History of Present Illness Patient is a 85 y/o female who presents with left hip pain s/p fall at home now s/p IM nail left femur. PMH includes HTN, HLD, COPD, glaucoma.     PT Comments    Pt performed gait training progression with max VCs for encouragement.  Pt continues to benefit from SNF placement based on need for external assistance to stand and ambulate.  Will plan for LE strengthening and gait training progression next session.    Follow Up Recommendations  SNF;Supervision for mobility/OOB;Supervision/Assistance - 24 hour     Equipment Recommendations  None recommended by PT    Recommendations for Other Services       Precautions / Restrictions Precautions Precautions: Fall Restrictions Weight Bearing Restrictions: Yes LLE Weight Bearing: Weight bearing as tolerated    Mobility  Bed Mobility Overal bed mobility: Needs Assistance Bed Mobility: Supine to Sit;Sit to Supine     Supine to sit: Mod assist     General bed mobility comments: increased time and effort, assist needed for trunk elevation and LLE advancement to edge of bed.   Transfers Overall transfer level: Needs assistance Equipment used: Rolling walker (2 wheeled) Transfers: Sit to/from Stand Sit to Stand: Mod assist         General transfer comment: Cues for sequencing and hand placement to and from seated surface.  Pt required moderate assistance to boost into standing.    Ambulation/Gait Ambulation/Gait assistance: Mod assist;+2 safety/equipment(for close chair follow) Gait Distance (Feet): 10 Feet Assistive device: Rolling walker (2 wheeled) Gait Pattern/deviations: Step-to pattern;Trunk flexed;Decreased step length - left;Antalgic Gait velocity: Decreased   General Gait Details: Cues for weight shifting to R to improve swing through and foot clearance on L  side.  Pt is slow and guarded due to pain.     Stairs             Wheelchair Mobility    Modified Rankin (Stroke Patients Only)       Balance Overall balance assessment: Needs assistance;History of Falls Sitting-balance support: Feet supported;Single extremity supported Sitting balance-Leahy Scale: Fair       Standing balance-Leahy Scale: Poor Standing balance comment: Requires BUE support in standing with RW.                             Cognition Arousal/Alertness: Awake/alert Behavior During Therapy: WFL for tasks assessed/performed Overall Cognitive Status: Within Functional Limits for tasks assessed                                        Exercises      General Comments        Pertinent Vitals/Pain Pain Assessment: Faces Faces Pain Scale: Hurts even more Pain Location: L LE Pain Descriptors / Indicators: Sore;Discomfort;Grimacing;Guarding;Operative site guarding;Sharp Pain Intervention(s): Monitored during session;Repositioned    Home Living                      Prior Function            PT Goals (current goals can now be found in the care plan section) Acute Rehab PT Goals Patient Stated Goal: to be able to stay at home as long as possible Potential to Achieve Goals:  Good Progress towards PT goals: Progressing toward goals    Frequency    Min 3X/week      PT Plan Current plan remains appropriate    Co-evaluation              AM-PAC PT "6 Clicks" Mobility   Outcome Measure  Help needed turning from your back to your side while in a flat bed without using bedrails?: A Little Help needed moving from lying on your back to sitting on the side of a flat bed without using bedrails?: A Little Help needed moving to and from a bed to a chair (including a wheelchair)?: A Little Help needed standing up from a chair using your arms (e.g., wheelchair or bedside chair)?: A Lot Help needed to walk in  hospital room?: A Lot Help needed climbing 3-5 steps with a railing? : Total 6 Click Score: 14    End of Session Equipment Utilized During Treatment: Gait belt Activity Tolerance: Patient limited by pain;Patient limited by fatigue Patient left: in chair;with call bell/phone within reach;with chair alarm set Nurse Communication: Mobility status;Patient requests pain meds PT Visit Diagnosis: Pain;Difficulty in walking, not elsewhere classified (R26.2);Unsteadiness on feet (R26.81);History of falling (Z91.81) Pain - Right/Left: Left Pain - part of body: Leg     Time: 1012-1030 PT Time Calculation (min) (ACUTE ONLY): 18 min  Charges:  $Gait Training: 8-22 mins                     Tina Mercado, PTA Acute Rehabilitation Services Pager [phone] Office [phone]     Tina Mercado 03/07/2019, 2:04 PM

## 2019-03-07 NOTE — Social Work (Signed)
Clinical Social Worker facilitated patient discharge including contacting patient family and facility to confirm patient discharge plans.  Clinical information faxed to facility and family agreeable with plan.  CSW arranged ambulance transport via PTAR to [facility] at 2pm. RN to call [phone]  with report prior to discharge.  Clinical Social Worker will sign off for now as social work intervention is no longer needed. Please consult [place] again if new need arises.  Westley Hummer, MSW, [place] Social Worker [phone]

## 2019-03-09 DIAGNOSIS — H40052 Ocular hypertension, left eye: Secondary | ICD-10-CM | POA: Diagnosis not present

## 2019-03-09 DIAGNOSIS — J9601 Acute respiratory failure with hypoxia: Secondary | ICD-10-CM | POA: Diagnosis not present

## 2019-03-09 DIAGNOSIS — R2689 Other abnormalities of gait and mobility: Secondary | ICD-10-CM | POA: Diagnosis not present

## 2019-03-09 DIAGNOSIS — J449 Chronic obstructive pulmonary disease, unspecified: Secondary | ICD-10-CM | POA: Diagnosis not present

## 2019-03-09 DIAGNOSIS — E785 Hyperlipidemia, unspecified: Secondary | ICD-10-CM | POA: Diagnosis not present

## 2019-03-09 DIAGNOSIS — S7292XD Unspecified fracture of left femur, subsequent encounter for closed fracture with routine healing: Secondary | ICD-10-CM | POA: Diagnosis not present

## 2019-03-09 DIAGNOSIS — K449 Diaphragmatic hernia without obstruction or gangrene: Secondary | ICD-10-CM | POA: Diagnosis not present

## 2019-03-09 DIAGNOSIS — K5900 Constipation, unspecified: Secondary | ICD-10-CM | POA: Diagnosis not present

## 2019-03-09 DIAGNOSIS — M6281 Muscle weakness (generalized): Secondary | ICD-10-CM | POA: Diagnosis not present

## 2019-03-09 DIAGNOSIS — D649 Anemia, unspecified: Secondary | ICD-10-CM | POA: Diagnosis not present

## 2019-03-09 DIAGNOSIS — I10 Essential (primary) hypertension: Secondary | ICD-10-CM | POA: Diagnosis not present

## 2019-03-09 DIAGNOSIS — I951 Orthostatic hypotension: Secondary | ICD-10-CM | POA: Diagnosis not present

## 2019-03-11 DIAGNOSIS — D649 Anemia, unspecified: Secondary | ICD-10-CM | POA: Diagnosis not present

## 2019-03-11 DIAGNOSIS — K5900 Constipation, unspecified: Secondary | ICD-10-CM | POA: Diagnosis not present

## 2019-03-11 DIAGNOSIS — H40052 Ocular hypertension, left eye: Secondary | ICD-10-CM | POA: Diagnosis not present

## 2019-03-11 DIAGNOSIS — J9601 Acute respiratory failure with hypoxia: Secondary | ICD-10-CM | POA: Diagnosis not present

## 2019-03-11 DIAGNOSIS — R682 Dry mouth, unspecified: Secondary | ICD-10-CM | POA: Diagnosis not present

## 2019-03-11 DIAGNOSIS — M6281 Muscle weakness (generalized): Secondary | ICD-10-CM | POA: Diagnosis not present

## 2019-03-11 DIAGNOSIS — K449 Diaphragmatic hernia without obstruction or gangrene: Secondary | ICD-10-CM | POA: Diagnosis not present

## 2019-03-11 DIAGNOSIS — S7292XD Unspecified fracture of left femur, subsequent encounter for closed fracture with routine healing: Secondary | ICD-10-CM | POA: Diagnosis not present

## 2019-03-11 DIAGNOSIS — I10 Essential (primary) hypertension: Secondary | ICD-10-CM | POA: Diagnosis not present

## 2019-03-11 DIAGNOSIS — I951 Orthostatic hypotension: Secondary | ICD-10-CM | POA: Diagnosis not present

## 2019-03-11 DIAGNOSIS — J449 Chronic obstructive pulmonary disease, unspecified: Secondary | ICD-10-CM | POA: Diagnosis not present

## 2019-03-11 DIAGNOSIS — R2689 Other abnormalities of gait and mobility: Secondary | ICD-10-CM | POA: Diagnosis not present

## 2019-03-15 DIAGNOSIS — R2689 Other abnormalities of gait and mobility: Secondary | ICD-10-CM | POA: Diagnosis not present

## 2019-03-15 DIAGNOSIS — M6281 Muscle weakness (generalized): Secondary | ICD-10-CM | POA: Diagnosis not present

## 2019-03-15 DIAGNOSIS — K5900 Constipation, unspecified: Secondary | ICD-10-CM | POA: Diagnosis not present

## 2019-03-15 DIAGNOSIS — E785 Hyperlipidemia, unspecified: Secondary | ICD-10-CM | POA: Diagnosis not present

## 2019-03-15 DIAGNOSIS — R682 Dry mouth, unspecified: Secondary | ICD-10-CM | POA: Diagnosis not present

## 2019-03-15 DIAGNOSIS — K219 Gastro-esophageal reflux disease without esophagitis: Secondary | ICD-10-CM | POA: Diagnosis not present

## 2019-03-15 DIAGNOSIS — H40052 Ocular hypertension, left eye: Secondary | ICD-10-CM | POA: Diagnosis not present

## 2019-03-15 DIAGNOSIS — M80052D Age-related osteoporosis with current pathological fracture, left femur, subsequent encounter for fracture with routine healing: Secondary | ICD-10-CM | POA: Diagnosis not present

## 2019-03-15 DIAGNOSIS — J449 Chronic obstructive pulmonary disease, unspecified: Secondary | ICD-10-CM | POA: Diagnosis not present

## 2019-03-15 DIAGNOSIS — E559 Vitamin D deficiency, unspecified: Secondary | ICD-10-CM | POA: Diagnosis not present

## 2019-03-15 DIAGNOSIS — G8911 Acute pain due to trauma: Secondary | ICD-10-CM | POA: Diagnosis not present

## 2019-03-15 DIAGNOSIS — D649 Anemia, unspecified: Secondary | ICD-10-CM | POA: Diagnosis not present

## 2019-03-18 ENCOUNTER — Other Ambulatory Visit: Payer: Self-pay | Admitting: *Deleted

## 2019-03-18 DIAGNOSIS — H40052 Ocular hypertension, left eye: Secondary | ICD-10-CM | POA: Diagnosis not present

## 2019-03-18 DIAGNOSIS — M80052D Age-related osteoporosis with current pathological fracture, left femur, subsequent encounter for fracture with routine healing: Secondary | ICD-10-CM | POA: Diagnosis not present

## 2019-03-18 DIAGNOSIS — R2689 Other abnormalities of gait and mobility: Secondary | ICD-10-CM | POA: Diagnosis not present

## 2019-03-18 DIAGNOSIS — D649 Anemia, unspecified: Secondary | ICD-10-CM | POA: Diagnosis not present

## 2019-03-18 DIAGNOSIS — E559 Vitamin D deficiency, unspecified: Secondary | ICD-10-CM | POA: Diagnosis not present

## 2019-03-18 DIAGNOSIS — K219 Gastro-esophageal reflux disease without esophagitis: Secondary | ICD-10-CM | POA: Diagnosis not present

## 2019-03-18 DIAGNOSIS — E785 Hyperlipidemia, unspecified: Secondary | ICD-10-CM | POA: Diagnosis not present

## 2019-03-18 DIAGNOSIS — J449 Chronic obstructive pulmonary disease, unspecified: Secondary | ICD-10-CM | POA: Diagnosis not present

## 2019-03-18 DIAGNOSIS — M6281 Muscle weakness (generalized): Secondary | ICD-10-CM | POA: Diagnosis not present

## 2019-03-18 DIAGNOSIS — G8911 Acute pain due to trauma: Secondary | ICD-10-CM | POA: Diagnosis not present

## 2019-03-18 DIAGNOSIS — R682 Dry mouth, unspecified: Secondary | ICD-10-CM | POA: Diagnosis not present

## 2019-03-18 DIAGNOSIS — K5900 Constipation, unspecified: Secondary | ICD-10-CM | POA: Diagnosis not present

## 2019-03-18 NOTE — Patient Outreach (Signed)
Member assessed for potential [facility] Care Management needs as a benefit of her [employer] Medicare insurance.  Tina Mercado is currently at [facility] SNF receiving rehab therapy.  Discussed member with [facility] RN who indicates, per facility, member's discharge plan is to return home. Her daughter is coming up from [place] to stay with member. Facility is outreaching to daughter regarding disposition planning and date.  Writer attempted to reach Tina Mercado to discuss [place] Management follow up on both numbers listed in chart (mobile) [phone] (no voicemail capability) and number [phone]. Unable to reach.   Will attempt to outreach member next week if she remains at [facility] . If member discharges prior to contact, will plan to make [place] referral post SNF discharge for complex case management.  Per chart member has a medical history of GERD, HTN, COPD, HTN and had multiple falls at home.  Will continue to follow for disposition plans.  Will continue to collaborate with [facility] UM RN on member.   Marthenia Rolling, MSN-Ed, RN,BSN [place] Acute Care Coordinator [phone]

## 2019-03-22 DIAGNOSIS — S72002D Fracture of unspecified part of neck of left femur, subsequent encounter for closed fracture with routine healing: Secondary | ICD-10-CM | POA: Diagnosis not present

## 2019-03-23 ENCOUNTER — Other Ambulatory Visit: Payer: Self-pay | Admitting: *Deleted

## 2019-03-23 DIAGNOSIS — R682 Dry mouth, unspecified: Secondary | ICD-10-CM | POA: Diagnosis not present

## 2019-03-23 DIAGNOSIS — H40052 Ocular hypertension, left eye: Secondary | ICD-10-CM | POA: Diagnosis not present

## 2019-03-23 DIAGNOSIS — E559 Vitamin D deficiency, unspecified: Secondary | ICD-10-CM | POA: Diagnosis not present

## 2019-03-23 DIAGNOSIS — M80052D Age-related osteoporosis with current pathological fracture, left femur, subsequent encounter for fracture with routine healing: Secondary | ICD-10-CM | POA: Diagnosis not present

## 2019-03-23 DIAGNOSIS — E785 Hyperlipidemia, unspecified: Secondary | ICD-10-CM | POA: Diagnosis not present

## 2019-03-23 DIAGNOSIS — K219 Gastro-esophageal reflux disease without esophagitis: Secondary | ICD-10-CM | POA: Diagnosis not present

## 2019-03-23 DIAGNOSIS — K5900 Constipation, unspecified: Secondary | ICD-10-CM | POA: Diagnosis not present

## 2019-03-23 DIAGNOSIS — G8911 Acute pain due to trauma: Secondary | ICD-10-CM | POA: Diagnosis not present

## 2019-03-23 DIAGNOSIS — J449 Chronic obstructive pulmonary disease, unspecified: Secondary | ICD-10-CM | POA: Diagnosis not present

## 2019-03-23 DIAGNOSIS — M6281 Muscle weakness (generalized): Secondary | ICD-10-CM | POA: Diagnosis not present

## 2019-03-23 DIAGNOSIS — D649 Anemia, unspecified: Secondary | ICD-10-CM | POA: Diagnosis not present

## 2019-03-23 DIAGNOSIS — R2689 Other abnormalities of gait and mobility: Secondary | ICD-10-CM | POA: Diagnosis not present

## 2019-03-23 NOTE — Patient Outreach (Signed)
Update received from [place]. Member remains at [facility] receiving rehab therapy. Likely discharge on 03/25/19 to home with daughter and with home health services.  Writer has attempted to outreach to member to discuss [place] Management as a benefit of her [employer] Medicare insurance at telephone numbers [phone] and [phone] without success. Unable to leave voicemail messages on either phone line.   Tina Mercado has medical history of HTN, GERD, COPD and has had recent hospitalization due to left hip fracture due to fall at home resulting in hip surgery.  Will make [place] Management referral upon SNF discharge for complex case management services.     Tina Rolling, MSN-Ed, RN,BSN [place] Acute Care Coordinator [phone]

## 2019-03-25 DIAGNOSIS — R2689 Other abnormalities of gait and mobility: Secondary | ICD-10-CM | POA: Diagnosis not present

## 2019-03-25 DIAGNOSIS — J449 Chronic obstructive pulmonary disease, unspecified: Secondary | ICD-10-CM | POA: Diagnosis not present

## 2019-03-25 DIAGNOSIS — M80052D Age-related osteoporosis with current pathological fracture, left femur, subsequent encounter for fracture with routine healing: Secondary | ICD-10-CM | POA: Diagnosis not present

## 2019-03-25 DIAGNOSIS — E559 Vitamin D deficiency, unspecified: Secondary | ICD-10-CM | POA: Diagnosis not present

## 2019-03-25 DIAGNOSIS — R682 Dry mouth, unspecified: Secondary | ICD-10-CM | POA: Diagnosis not present

## 2019-03-25 DIAGNOSIS — G8911 Acute pain due to trauma: Secondary | ICD-10-CM | POA: Diagnosis not present

## 2019-03-25 DIAGNOSIS — M6281 Muscle weakness (generalized): Secondary | ICD-10-CM | POA: Diagnosis not present

## 2019-03-25 DIAGNOSIS — K219 Gastro-esophageal reflux disease without esophagitis: Secondary | ICD-10-CM | POA: Diagnosis not present

## 2019-03-25 DIAGNOSIS — K5900 Constipation, unspecified: Secondary | ICD-10-CM | POA: Diagnosis not present

## 2019-03-25 DIAGNOSIS — E785 Hyperlipidemia, unspecified: Secondary | ICD-10-CM | POA: Diagnosis not present

## 2019-03-25 DIAGNOSIS — D649 Anemia, unspecified: Secondary | ICD-10-CM | POA: Diagnosis not present

## 2019-03-25 DIAGNOSIS — H40052 Ocular hypertension, left eye: Secondary | ICD-10-CM | POA: Diagnosis not present

## 2019-03-27 DIAGNOSIS — E559 Vitamin D deficiency, unspecified: Secondary | ICD-10-CM | POA: Diagnosis not present

## 2019-03-27 DIAGNOSIS — D62 Acute posthemorrhagic anemia: Secondary | ICD-10-CM | POA: Diagnosis not present

## 2019-03-27 DIAGNOSIS — M80052D Age-related osteoporosis with current pathological fracture, left femur, subsequent encounter for fracture with routine healing: Secondary | ICD-10-CM | POA: Diagnosis not present

## 2019-03-27 DIAGNOSIS — I10 Essential (primary) hypertension: Secondary | ICD-10-CM | POA: Diagnosis not present

## 2019-03-27 DIAGNOSIS — J449 Chronic obstructive pulmonary disease, unspecified: Secondary | ICD-10-CM | POA: Diagnosis not present

## 2019-03-27 DIAGNOSIS — R278 Other lack of coordination: Secondary | ICD-10-CM | POA: Diagnosis not present

## 2019-03-27 DIAGNOSIS — E785 Hyperlipidemia, unspecified: Secondary | ICD-10-CM | POA: Diagnosis not present

## 2019-03-27 DIAGNOSIS — G609 Hereditary and idiopathic neuropathy, unspecified: Secondary | ICD-10-CM | POA: Diagnosis not present

## 2019-03-27 DIAGNOSIS — W19XXXD Unspecified fall, subsequent encounter: Secondary | ICD-10-CM | POA: Diagnosis not present

## 2019-03-27 DIAGNOSIS — G894 Chronic pain syndrome: Secondary | ICD-10-CM | POA: Diagnosis not present

## 2019-03-27 DIAGNOSIS — K5909 Other constipation: Secondary | ICD-10-CM | POA: Diagnosis not present

## 2019-03-27 DIAGNOSIS — H40052 Ocular hypertension, left eye: Secondary | ICD-10-CM | POA: Diagnosis not present

## 2019-03-27 DIAGNOSIS — H409 Unspecified glaucoma: Secondary | ICD-10-CM | POA: Diagnosis not present

## 2019-03-29 DIAGNOSIS — M80052D Age-related osteoporosis with current pathological fracture, left femur, subsequent encounter for fracture with routine healing: Secondary | ICD-10-CM | POA: Diagnosis not present

## 2019-03-29 DIAGNOSIS — J449 Chronic obstructive pulmonary disease, unspecified: Secondary | ICD-10-CM | POA: Diagnosis not present

## 2019-03-29 DIAGNOSIS — I10 Essential (primary) hypertension: Secondary | ICD-10-CM | POA: Diagnosis not present

## 2019-03-29 DIAGNOSIS — D62 Acute posthemorrhagic anemia: Secondary | ICD-10-CM | POA: Diagnosis not present

## 2019-03-29 DIAGNOSIS — K5909 Other constipation: Secondary | ICD-10-CM | POA: Diagnosis not present

## 2019-03-29 DIAGNOSIS — G609 Hereditary and idiopathic neuropathy, unspecified: Secondary | ICD-10-CM | POA: Diagnosis not present

## 2019-03-30 ENCOUNTER — Other Ambulatory Visit: Payer: Self-pay | Admitting: *Deleted

## 2019-03-30 DIAGNOSIS — I10 Essential (primary) hypertension: Secondary | ICD-10-CM

## 2019-03-30 NOTE — Patient Outreach (Addendum)
Member assessed for [place] Management due to her [employer].   Member has a medical history of COPD, GERD, HTN with plans to discharge home with daughter to come assist.  Member discharged from [facility] SNF on 03/25/19.   Telephone call made to [facility] home number. Her daughter Pam answered. States Ms Lesesne is in the bathroom. Patient identifiers confirmed. Pam reports that home health has been out already and that she does not feel that member needs any additional support from [place] Management.   Plan to sign off. Will not plan to request [employer] Management due to daughter declining [place] Management services on member's behalf.    Marthenia Rolling, MSN-Ed, RN,BSN [place] Acute Care Coordinator [phone]

## 2019-04-01 DIAGNOSIS — M80052D Age-related osteoporosis with current pathological fracture, left femur, subsequent encounter for fracture with routine healing: Secondary | ICD-10-CM | POA: Diagnosis not present

## 2019-04-01 DIAGNOSIS — J449 Chronic obstructive pulmonary disease, unspecified: Secondary | ICD-10-CM | POA: Diagnosis not present

## 2019-04-01 DIAGNOSIS — K5909 Other constipation: Secondary | ICD-10-CM | POA: Diagnosis not present

## 2019-04-01 DIAGNOSIS — I10 Essential (primary) hypertension: Secondary | ICD-10-CM | POA: Diagnosis not present

## 2019-04-01 DIAGNOSIS — G609 Hereditary and idiopathic neuropathy, unspecified: Secondary | ICD-10-CM | POA: Diagnosis not present

## 2019-04-01 DIAGNOSIS — D62 Acute posthemorrhagic anemia: Secondary | ICD-10-CM | POA: Diagnosis not present

## 2019-04-05 DIAGNOSIS — G609 Hereditary and idiopathic neuropathy, unspecified: Secondary | ICD-10-CM | POA: Diagnosis not present

## 2019-04-05 DIAGNOSIS — I10 Essential (primary) hypertension: Secondary | ICD-10-CM | POA: Diagnosis not present

## 2019-04-05 DIAGNOSIS — D62 Acute posthemorrhagic anemia: Secondary | ICD-10-CM | POA: Diagnosis not present

## 2019-04-05 DIAGNOSIS — J449 Chronic obstructive pulmonary disease, unspecified: Secondary | ICD-10-CM | POA: Diagnosis not present

## 2019-04-05 DIAGNOSIS — K5909 Other constipation: Secondary | ICD-10-CM | POA: Diagnosis not present

## 2019-04-05 DIAGNOSIS — M80052D Age-related osteoporosis with current pathological fracture, left femur, subsequent encounter for fracture with routine healing: Secondary | ICD-10-CM | POA: Diagnosis not present

## 2019-04-07 DIAGNOSIS — M80052D Age-related osteoporosis with current pathological fracture, left femur, subsequent encounter for fracture with routine healing: Secondary | ICD-10-CM | POA: Diagnosis not present

## 2019-04-07 DIAGNOSIS — S72002A Fracture of unspecified part of neck of left femur, initial encounter for closed fracture: Secondary | ICD-10-CM | POA: Diagnosis not present

## 2019-04-07 DIAGNOSIS — M810 Age-related osteoporosis without current pathological fracture: Secondary | ICD-10-CM | POA: Diagnosis not present

## 2019-04-07 DIAGNOSIS — G609 Hereditary and idiopathic neuropathy, unspecified: Secondary | ICD-10-CM | POA: Diagnosis not present

## 2019-04-07 DIAGNOSIS — J449 Chronic obstructive pulmonary disease, unspecified: Secondary | ICD-10-CM | POA: Diagnosis not present

## 2019-04-07 DIAGNOSIS — K5909 Other constipation: Secondary | ICD-10-CM | POA: Diagnosis not present

## 2019-04-07 DIAGNOSIS — D62 Acute posthemorrhagic anemia: Secondary | ICD-10-CM | POA: Diagnosis not present

## 2019-04-07 DIAGNOSIS — I10 Essential (primary) hypertension: Secondary | ICD-10-CM | POA: Diagnosis not present

## 2019-04-10 ENCOUNTER — Encounter (HOSPITAL_COMMUNITY): Payer: Self-pay | Admitting: Emergency Medicine

## 2019-04-10 ENCOUNTER — Other Ambulatory Visit: Payer: Self-pay

## 2019-04-10 ENCOUNTER — Emergency Department (HOSPITAL_COMMUNITY): Payer: Medicare Other

## 2019-04-10 ENCOUNTER — Inpatient Hospital Stay (HOSPITAL_COMMUNITY)
Admission: EM | Admit: 2019-04-10 | Discharge: 2019-04-14 | DRG: 193 | Disposition: A | Discharge status: Skilled Nursing Facility | Payer: Medicare Other | Attending: Internal Medicine | Admitting: Internal Medicine

## 2019-04-10 DIAGNOSIS — I340 Nonrheumatic mitral (valve) insufficiency: Secondary | ICD-10-CM | POA: Diagnosis present

## 2019-04-10 DIAGNOSIS — Z91041 Radiographic dye allergy status: Secondary | ICD-10-CM

## 2019-04-10 DIAGNOSIS — Z79899 Other long term (current) drug therapy: Secondary | ICD-10-CM

## 2019-04-10 DIAGNOSIS — I499 Cardiac arrhythmia, unspecified: Secondary | ICD-10-CM | POA: Diagnosis not present

## 2019-04-10 DIAGNOSIS — J449 Chronic obstructive pulmonary disease, unspecified: Secondary | ICD-10-CM | POA: Diagnosis present

## 2019-04-10 DIAGNOSIS — R0902 Hypoxemia: Secondary | ICD-10-CM | POA: Diagnosis present

## 2019-04-10 DIAGNOSIS — E860 Dehydration: Secondary | ICD-10-CM | POA: Diagnosis present

## 2019-04-10 DIAGNOSIS — R630 Anorexia: Secondary | ICD-10-CM | POA: Diagnosis present

## 2019-04-10 DIAGNOSIS — J189 Pneumonia, unspecified organism: Principal | ICD-10-CM | POA: Diagnosis present

## 2019-04-10 DIAGNOSIS — K219 Gastro-esophageal reflux disease without esophagitis: Secondary | ICD-10-CM | POA: Diagnosis present

## 2019-04-10 DIAGNOSIS — E871 Hypo-osmolality and hyponatremia: Secondary | ICD-10-CM | POA: Diagnosis present

## 2019-04-10 DIAGNOSIS — K5900 Constipation, unspecified: Secondary | ICD-10-CM | POA: Diagnosis present

## 2019-04-10 DIAGNOSIS — Z96642 Presence of left artificial hip joint: Secondary | ICD-10-CM | POA: Diagnosis present

## 2019-04-10 DIAGNOSIS — M6281 Muscle weakness (generalized): Secondary | ICD-10-CM | POA: Diagnosis not present

## 2019-04-10 DIAGNOSIS — H9190 Unspecified hearing loss, unspecified ear: Secondary | ICD-10-CM | POA: Diagnosis present

## 2019-04-10 DIAGNOSIS — J302 Other seasonal allergic rhinitis: Secondary | ICD-10-CM | POA: Diagnosis present

## 2019-04-10 DIAGNOSIS — Z791 Long term (current) use of non-steroidal anti-inflammatories (NSAID): Secondary | ICD-10-CM

## 2019-04-10 DIAGNOSIS — J370 Chronic laryngitis: Secondary | ICD-10-CM | POA: Diagnosis present

## 2019-04-10 DIAGNOSIS — Y95 Nosocomial condition: Secondary | ICD-10-CM | POA: Diagnosis present

## 2019-04-10 DIAGNOSIS — M810 Age-related osteoporosis without current pathological fracture: Secondary | ICD-10-CM | POA: Diagnosis present

## 2019-04-10 DIAGNOSIS — J90 Pleural effusion, not elsewhere classified: Secondary | ICD-10-CM | POA: Diagnosis present

## 2019-04-10 DIAGNOSIS — R531 Weakness: Secondary | ICD-10-CM

## 2019-04-10 DIAGNOSIS — J440 Chronic obstructive pulmonary disease with acute lower respiratory infection: Secondary | ICD-10-CM | POA: Diagnosis present

## 2019-04-10 DIAGNOSIS — Z20828 Contact with and (suspected) exposure to other viral communicable diseases: Secondary | ICD-10-CM | POA: Diagnosis present

## 2019-04-10 DIAGNOSIS — J9601 Acute respiratory failure with hypoxia: Secondary | ICD-10-CM | POA: Diagnosis present

## 2019-04-10 DIAGNOSIS — R05 Cough: Secondary | ICD-10-CM | POA: Diagnosis not present

## 2019-04-10 DIAGNOSIS — E872 Acidosis: Secondary | ICD-10-CM | POA: Diagnosis present

## 2019-04-10 DIAGNOSIS — H409 Unspecified glaucoma: Secondary | ICD-10-CM | POA: Diagnosis present

## 2019-04-10 DIAGNOSIS — M2550 Pain in unspecified joint: Secondary | ICD-10-CM | POA: Diagnosis not present

## 2019-04-10 DIAGNOSIS — R42 Dizziness and giddiness: Secondary | ICD-10-CM | POA: Diagnosis not present

## 2019-04-10 DIAGNOSIS — Z8709 Personal history of other diseases of the respiratory system: Secondary | ICD-10-CM | POA: Diagnosis not present

## 2019-04-10 DIAGNOSIS — Z7951 Long term (current) use of inhaled steroids: Secondary | ICD-10-CM

## 2019-04-10 DIAGNOSIS — W19XXXA Unspecified fall, initial encounter: Secondary | ICD-10-CM | POA: Diagnosis not present

## 2019-04-10 DIAGNOSIS — R918 Other nonspecific abnormal finding of lung field: Secondary | ICD-10-CM | POA: Diagnosis not present

## 2019-04-10 DIAGNOSIS — Z862 Personal history of diseases of the blood and blood-forming organs and certain disorders involving the immune mechanism: Secondary | ICD-10-CM

## 2019-04-10 DIAGNOSIS — Z825 Family history of asthma and other chronic lower respiratory diseases: Secondary | ICD-10-CM

## 2019-04-10 DIAGNOSIS — H269 Unspecified cataract: Secondary | ICD-10-CM | POA: Diagnosis not present

## 2019-04-10 DIAGNOSIS — Z7982 Long term (current) use of aspirin: Secondary | ICD-10-CM

## 2019-04-10 DIAGNOSIS — D649 Anemia, unspecified: Secondary | ICD-10-CM | POA: Diagnosis present

## 2019-04-10 DIAGNOSIS — I10 Essential (primary) hypertension: Secondary | ICD-10-CM | POA: Diagnosis present

## 2019-04-10 DIAGNOSIS — E785 Hyperlipidemia, unspecified: Secondary | ICD-10-CM | POA: Diagnosis present

## 2019-04-10 DIAGNOSIS — Z6822 Body mass index (BMI) 22.0-22.9, adult: Secondary | ICD-10-CM | POA: Diagnosis not present

## 2019-04-10 DIAGNOSIS — H547 Unspecified visual loss: Secondary | ICD-10-CM | POA: Diagnosis present

## 2019-04-10 DIAGNOSIS — R41841 Cognitive communication deficit: Secondary | ICD-10-CM | POA: Diagnosis not present

## 2019-04-10 DIAGNOSIS — Z886 Allergy status to analgesic agent status: Secondary | ICD-10-CM

## 2019-04-10 DIAGNOSIS — Z888 Allergy status to other drugs, medicaments and biological substances status: Secondary | ICD-10-CM

## 2019-04-10 DIAGNOSIS — K449 Diaphragmatic hernia without obstruction or gangrene: Secondary | ICD-10-CM | POA: Diagnosis present

## 2019-04-10 DIAGNOSIS — Z7401 Bed confinement status: Secondary | ICD-10-CM | POA: Diagnosis not present

## 2019-04-10 DIAGNOSIS — U071 COVID-19: Secondary | ICD-10-CM | POA: Diagnosis not present

## 2019-04-10 DIAGNOSIS — Z8249 Family history of ischemic heart disease and other diseases of the circulatory system: Secondary | ICD-10-CM

## 2019-04-10 DIAGNOSIS — Z209 Contact with and (suspected) exposure to unspecified communicable disease: Secondary | ICD-10-CM | POA: Diagnosis not present

## 2019-04-10 DIAGNOSIS — Z885 Allergy status to narcotic agent status: Secondary | ICD-10-CM

## 2019-04-10 DIAGNOSIS — R011 Cardiac murmur, unspecified: Secondary | ICD-10-CM | POA: Diagnosis not present

## 2019-04-10 DIAGNOSIS — Z792 Long term (current) use of antibiotics: Secondary | ICD-10-CM

## 2019-04-10 DIAGNOSIS — R0602 Shortness of breath: Secondary | ICD-10-CM | POA: Diagnosis not present

## 2019-04-10 LAB — LIPASE, BLOOD: Lipase: 38 U/L (ref 11–51)

## 2019-04-10 LAB — COMPREHENSIVE METABOLIC PANEL
ALT: 17 U/L (ref 0–44)
AST: 27 U/L (ref 15–41)
Albumin: 3.6 g/dL (ref 3.5–5.0)
Alkaline Phosphatase: 104 U/L (ref 38–126)
Anion gap: 9 (ref 5–15)
BUN: 20 mg/dL (ref 8–23)
CO2: 18 mmol/L — ABNORMAL LOW (ref 22–32)
Calcium: 8.9 mg/dL (ref 8.9–10.3)
Chloride: 103 mmol/L (ref 98–111)
Creatinine, Ser: 1.15 mg/dL — ABNORMAL HIGH (ref 0.44–1.00)
GFR calc Af Amer: 49 mL/min — ABNORMAL LOW (ref >60)
GFR calc non Af Amer: 42 mL/min — ABNORMAL LOW (ref >60)
Glucose, Bld: 96 mg/dL (ref 70–99)
Potassium: 5 mmol/L (ref 3.5–5.1)
Sodium: 130 mmol/L — ABNORMAL LOW (ref 135–145)
Total Bilirubin: 0.4 mg/dL (ref 0.3–1.2)
Total Protein: 6.7 g/dL (ref 6.5–8.1)

## 2019-04-10 LAB — CBC WITH DIFFERENTIAL/PLATELET
Abs Immature Granulocytes: 0.09 10*3/uL — ABNORMAL HIGH (ref 0.00–0.07)
Basophils Absolute: 0 10*3/uL (ref 0.0–0.1)
Basophils Relative: 0 %
Eosinophils Absolute: 0.7 10*3/uL — ABNORMAL HIGH (ref 0.0–0.5)
Eosinophils Relative: 11 %
HCT: 41.3 % (ref 36.0–46.0)
Hemoglobin: 13.4 g/dL (ref 12.0–15.0)
Immature Granulocytes: 1 %
Lymphocytes Relative: 8 %
Lymphs Abs: 0.5 10*3/uL — ABNORMAL LOW (ref 0.7–4.0)
MCH: 29.6 pg (ref 26.0–34.0)
MCHC: 32.4 g/dL (ref 30.0–36.0)
MCV: 91.2 fL (ref 80.0–100.0)
Monocytes Absolute: 0.3 10*3/uL (ref 0.1–1.0)
Monocytes Relative: 4 %
Neutro Abs: 5.2 10*3/uL (ref 1.7–7.7)
Neutrophils Relative %: 76 %
Platelets: 220 10*3/uL (ref 150–400)
RBC: 4.53 MIL/uL (ref 3.87–5.11)
RDW: 15.9 % — ABNORMAL HIGH (ref 11.5–15.5)
WBC: 6.9 10*3/uL (ref 4.0–10.5)
nRBC: 0 % (ref 0.0–0.2)

## 2019-04-10 LAB — URINALYSIS, ROUTINE W REFLEX MICROSCOPIC
Bilirubin Urine: NEGATIVE
Glucose, UA: NEGATIVE mg/dL
Hgb urine dipstick: NEGATIVE
Ketones, ur: NEGATIVE mg/dL
Leukocytes,Ua: NEGATIVE
Nitrite: NEGATIVE
Protein, ur: NEGATIVE mg/dL
Specific Gravity, Urine: 1.009 (ref 1.005–1.030)
pH: 6 (ref 5.0–8.0)

## 2019-04-10 LAB — MAGNESIUM: Magnesium: 2 mg/dL (ref 1.7–2.4)

## 2019-04-10 LAB — D-DIMER, QUANTITATIVE: D-Dimer, Quant: 2.06 ug/mL-FEU — ABNORMAL HIGH (ref 0.00–0.50)

## 2019-04-10 LAB — SARS CORONAVIRUS 2 BY RT PCR (HOSPITAL ORDER, PERFORMED IN ~~LOC~~ HOSPITAL LAB): SARS Coronavirus 2: NEGATIVE

## 2019-04-10 MED ORDER — SODIUM CHLORIDE 0.9 % IV BOLUS
500.0000 mL | Freq: Once | INTRAVENOUS | Status: AC
  Administered 2019-04-10: 500 mL via INTRAVENOUS

## 2019-04-10 NOTE — ED Provider Notes (Signed)
[facility] DEPT Provider Note   CSN: [id number] Arrival date & time: 04/10/19  1430    History   Chief Complaint Chief Complaint  Patient presents with   Fatigue   Anorexia   loss of taste    HPI ARKIE TAGLIAFERRO is a 85 y.o. female.     HPI Patient with gradual decline since having left hip replacement and rehab.  States she is been home for 2 weeks.  Has had decreased oral intake, increased generalized weakness and reports "better" taste in the back of her mouth.  Daughter states she is particularly declined over the last 2 days with very fluid intake.  States she has not been able to do home physical therapy.  She occasionally gets nauseated but denies abdominal pain, chest pain. Past Medical History:  Diagnosis Date   Allergy    Arrhythmia    Cataract    COPD (chronic obstructive pulmonary disease) (HCC)    GERD (gastroesophageal reflux disease)    Glaucoma    Hearing loss    does not wear hearing aids   Heart murmur    History of anemia    History of diastolic dysfunction    History of seasonal allergies    Hyperlipidemia    Hypertension    Large hiatal hernia    see on thoracic spine xray   Mitral regurgitation    mild to moderate   Osteoporosis    Reflux    Wears glasses    readers    Patient Active Problem List   Diagnosis Date Noted   Hyponatremia 04/10/2019   Closed left hip fracture, initial encounter ([facility]) 03/02/2019   Prediabetes 09/28/2017   Dizziness 09/15/2017   Neuropathy 07/16/2017   COPD mixed type ([place]) 04/10/2015   Laryngopharyngeal reflux (LPR) 04/10/2015   Chronic laryngitis 08/17/2014   Blindness and low vision 04/13/2013   Osteoporosis 12/23/2012   Bronchitis 10/30/2011   Benign hypertensive heart disease without heart failure 08/05/2011   Pure hypercholesterolemia 08/05/2011   Mitral regurgitation    History of anemia    History of diastolic dysfunction      GERD (gastroesophageal reflux disease)    History of seasonal allergies    Large hiatal hernia 11/23/2004    Past Surgical History:  Procedure Laterality Date   APPENDECTOMY     BREAST SURGERY     childbirth     x 1   CORNEAL TRANSPLANT  july 2007   EYE SURGERY     INTRAMEDULLARY (IM) NAIL INTERTROCHANTERIC Left 03/03/2019   Procedure: INTRAMEDULLARY (IM) NAIL INTERTROCHANTRIC;  Surgeon: Shona Needles, MD;  Location: [facility];  Service: Orthopedics;  Laterality: Left;   TONSILLECTOMY AND ADENOIDECTOMY     age 23yrs     OB History   No obstetric history on file.      Home Medications    Prior to Admission medications   Medication Sig Start Date End Date Taking? Authorizing Provider  acetaminophen (TYLENOL) 325 MG tablet Take 2 tablets (650 mg total) by mouth every 6 (six) hours as needed for mild pain. 03/05/19  Yes Regalado, Belkys A, MD  ADVAIR HFA 115-21 MCG/ACT inhaler TAKE 2 PUFFS BY MOUTH TWICE A DAY Patient taking differently: Inhale 2 puffs into the lungs 2 (two) times daily.  09/29/18  Yes Noralee Space, MD  amitriptyline (ELAVIL) 10 MG tablet TAKE 1 TABLET BY MOUTH EVERYDAY AT BEDTIME Patient taking differently: Take 10 mg by mouth at bedtime.  10/15/18  Yes Shawnee Knapp, MD  aspirin EC 81 MG tablet Take 81 mg by mouth at bedtime.    Yes [provider]  Cholecalciferol (VITAMIN D) 50 MCG (2000 UT) CAPS Take 2,000 Units by mouth at bedtime.   Yes [provider]  diclofenac (VOLTAREN) 75 MG EC tablet Take 75 mg by mouth daily as needed for mild pain.   Yes [provider]  docusate sodium (COLACE) 100 MG capsule Take 100 mg by mouth daily as needed for mild constipation.    Yes [provider]  esomeprazole (NEXIUM) 40 MG capsule Take 1 capsule (40 mg total) by mouth at bedtime. Patient taking differently: Take 40 mg by mouth daily before supper.  02/08/19  Yes Parrett, Tammy S, NP  feeding supplement, ENSURE ENLIVE, (ENSURE  ENLIVE) LIQD Take 237 mLs by mouth 2 (two) times daily between meals. 03/06/19  Yes Regalado, Belkys A, MD  hydrochlorothiazide (HYDRODIURIL) 12.5 MG tablet Take 12.5-25 mg by mouth daily as needed (elevated blood pressure).   Yes [provider]  ketorolac (ACULAR) 0.5 % ophthalmic solution Place 1 drop into the right eye 2 (two) times daily.   Yes [provider]  meclizine (ANTIVERT) 25 MG tablet Take 25 mg by mouth 3 (three) times daily as needed for dizziness.   Yes [provider]  Multiple Vitamin (MULTIVITAMIN WITH MINERALS) TABS tablet Take 1 tablet by mouth daily. 03/06/19  Yes Regalado, Belkys A, MD  ondansetron (ZOFRAN) 4 MG tablet Take 4 mg by mouth every 8 (eight) hours as needed for nausea or vomiting.   Yes [provider]  rosuvastatin (CRESTOR) 10 MG tablet TAKE 1 TABLET BY MOUTH DAILY. MUST SCHEDULE APPT WITH PROVIDER TO GET FUTURE REFILLS - 2ND ATTEMPT Patient taking differently: Take 10 mg by mouth daily.  01/31/19  Yes Skeet Latch, MD  sulfamethoxazole-trimethoprim (BACTRIM DS) 800-160 MG tablet Take 1 tablet by mouth 2 (two) times daily.   Yes [provider]  timolol (BETIMOL) 0.5 % ophthalmic solution Place 1 drop into both eyes 2 (two) times daily.   Yes [provider]  enoxaparin (LOVENOX) 30 MG/0.3ML injection Inject 0.3 mLs (30 mg total) into the skin daily. Patient not taking: Reported on 04/10/2019 03/07/19   Regalado, Jerald Kief A, MD  Glycerin, Adult, 2.1 g SUPP Place 1 suppository rectally daily as needed for moderate constipation. Patient not taking: Reported on 04/10/2019 03/05/19   Regalado, Jerald Kief A, MD  HYDROcodone-acetaminophen (NORCO/VICODIN) 5-325 MG tablet Take 1 tablet by mouth every 6 (six) hours as needed for moderate pain. Patient not taking: Reported on 04/10/2019 03/05/19   Regalado, Jerald Kief A, MD  ibuprofen (ADVIL) 200 MG tablet Take 200 mg by mouth every 6 (six) hours as needed for headache or mild pain.     [provider]  polyethylene glycol (MIRALAX / GLYCOLAX) 17 g packet Take 17 g by mouth daily. Patient not taking: Reported on 04/10/2019 03/06/19   Elmarie Shiley, MD    Family History Family History  Problem Relation Age of Onset   Heart disease Mother    Emphysema Father    Emphysema Brother    COPD Brother     Social History Social History   Tobacco Use   Smoking status: Never Smoker   Smokeless tobacco: Never Used  Substance Use Topics   Alcohol use: No    Alcohol/week: 0.0 standard drinks   Drug use: No     Allergies   Contrast media [iodinated diagnostic  agents]; Antihistamines, diphenhydramine-type; Celebrex [celecoxib]; Darvocet [propoxyphene n-acetaminophen]; Demerol; Diphenhydramine hcl; Feldene [piroxicam]; Gabapentin; Meperidine; Propoxyphene; and Iodine-131   Review of Systems Review of Systems  Constitutional: Positive for appetite change and fatigue. Negative for chills and fever.  HENT: Negative for congestion, sore throat, trouble swallowing and voice change.   Eyes: Negative for visual disturbance.  Respiratory: Negative for cough and shortness of breath.   Cardiovascular: Negative for chest pain.  Gastrointestinal: Positive for nausea. Negative for abdominal pain, constipation, diarrhea and vomiting.  Genitourinary: Negative for dysuria and flank pain.  Musculoskeletal: Negative for back pain, myalgias and neck pain.  Skin: Negative for rash and wound.  Neurological: Positive for weakness. Negative for dizziness, light-headedness, numbness and headaches.  All other systems reviewed and are negative.    Physical Exam Updated Vital Signs BP (!) 150/62    Pulse 79    Temp 98.8 F (37.1 C) (Oral)    Resp 15    LMP  (LMP Unknown)    SpO2 96%   Physical Exam Vitals signs and nursing note reviewed.  Constitutional:      Appearance: Normal appearance. She is well-developed.  HENT:     Head: Normocephalic and atraumatic.      Nose: Nose normal.     Mouth/Throat:     Mouth: Mucous membranes are dry.     Pharynx: No oropharyngeal exudate or posterior oropharyngeal erythema.  Eyes:     Extraocular Movements: Extraocular movements intact.     Pupils: Pupils are equal, round, and reactive to light.  Neck:     Musculoskeletal: Normal range of motion and neck supple. No neck rigidity or muscular tenderness.  Cardiovascular:     Rate and Rhythm: Normal rate and regular rhythm.     Heart sounds: No murmur. No friction rub. No gallop.   Pulmonary:     Effort: Pulmonary effort is normal. No respiratory distress.     Breath sounds: Normal breath sounds. No stridor. No wheezing, rhonchi or rales.  Chest:     Chest wall: No tenderness.  Abdominal:     General: Bowel sounds are normal. There is no distension.     Palpations: Abdomen is soft.     Tenderness: There is no abdominal tenderness. There is no guarding or rebound.  Musculoskeletal: Normal range of motion.        General: No swelling, tenderness, deformity or signs of injury.     Right lower leg: No edema.     Left lower leg: No edema.  Lymphadenopathy:     Cervical: No cervical adenopathy.  Skin:    General: Skin is warm and dry.     Findings: No erythema or rash.     Comments: Surgical incision of left hip appears well-healing.  Neurological:     General: No focal deficit present.     Mental Status: She is alert and oriented to person, place, and time.     Comments: 4/5 motor in all extremities.  Sensation grossly intact.  Psychiatric:        Behavior: Behavior normal.     Comments: Mildly dysphoric mood      ED Treatments / Results  Labs (all labs ordered are listed, but only abnormal results are displayed) Labs Reviewed  CBC WITH DIFFERENTIAL/PLATELET - Abnormal; Notable for the following components:      Result Value   RDW 15.9 (*)    Lymphs Abs 0.5 (*)    Eosinophils Absolute 0.7 (*)    Abs  Immature Granulocytes 0.09 (*)    All other  components within normal limits  COMPREHENSIVE METABOLIC PANEL - Abnormal; Notable for the following components:   Sodium 130 (*)    CO2 18 (*)    Creatinine, Ser 1.15 (*)    GFR calc non Af Amer 42 (*)    GFR calc Af Amer 49 (*)    All other components within normal limits  D-DIMER, QUANTITATIVE (NOT AT [facility]) - Abnormal; Notable for the following components:   D-Dimer, Quant 2.06 (*)    All other components within normal limits  SARS CORONAVIRUS 2 (HOSPITAL ORDER, [facility])  LIPASE, BLOOD  MAGNESIUM  URINALYSIS, ROUTINE W REFLEX MICROSCOPIC    EKG EKG Interpretation  Date/Time:  Sunday April 10 2019 15:00:42 EDT Ventricular Rate:  79 PR Interval:    QRS Duration: 73 QT Interval:  365 QTC Calculation: 419 R Axis:   -18 Text Interpretation:  Sinus rhythm Borderline prolonged PR interval Borderline left axis deviation Anteroseptal infarct, old Confirmed by Julianne Rice [phone]) on 04/10/2019 10:25:36 PM   Radiology Dg Chest Port 1 View  Result Date: 04/10/2019 CLINICAL DATA:  Weakness. EXAM: PORTABLE CHEST 1 VIEW COMPARISON:  Radiograph 03/02/2019, chest CT 04/01/2016 FINDINGS: Chronic hyperinflation. Retrocardiac opacity, which is slightly more dense than on prior exam, patient with known large retrocardiac hiatal hernia. Mild blunting of the left costophrenic angle and obscuration of the hemidiaphragm. Lungs elsewhere clear. No pulmonary edema or pneumothorax. The bones are under mineralized. IMPRESSION: 1. Increased retrocardiac opacity which may be in part due to gastric contents related to known large hiatal hernia, however superimposed airspace disease difficult to exclude in this setting. 2. Suspected small left pleural effusion which is new. 3. Chronic hyperinflation. Electronically Signed   By: Keith Rake M.D.   On: 04/10/2019 19:50    Procedures Procedures (including critical care time)  Medications Ordered in ED Medications  sodium  chloride 0.9 % bolus 500 mL (0 mLs Intravenous Stopped 04/10/19 1816)  sodium chloride 0.9 % bolus 500 mL (0 mLs Intravenous Stopped 04/10/19 1856)     Initial Impression / Assessment and Plan / ED Course  I have reviewed the triage vital signs and the nursing notes.  Pertinent labs & imaging results that were available during my care of the patient were reviewed by me and considered in my medical decision making (see chart for details).       Patient appears dehydrated.  Given 2 boluses of IV fluids.  Laboratory work-up with mild hyponatremia.  Patient with minor exertion had oxygen desaturation into the low 80s.  Given recent surgery concern for possible PE.  Patient has allergy to IV contrast.  Discussed with Dr. Jonelle Sidle.  Will get d-dimer in emergency department as well as COVID testing.  COVID test is negative.  Elevated d-dimer.  Discussed with Dr. Jeannie Fend who will admit for VQ scan in the morning.  Final Clinical Impressions(s) / ED Diagnoses   Final diagnoses:  Generalized weakness  Hypoxia    ED Discharge Orders    None       Julianne Rice, MD 04/10/19 2226
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
## 2019-04-10 NOTE — ED Triage Notes (Signed)
Per [facility] pt from home for weakness, loss of appetite, loss of taste since going to [facility] for rehab for hip surgery 2 weeks ago. 140/70, 82hr, 94% on RA, CBG 114, temp 98.1. 1st degree AV block on EKG.

## 2019-04-11 ENCOUNTER — Telehealth: Payer: Self-pay | Admitting: Family Medicine

## 2019-04-11 ENCOUNTER — Inpatient Hospital Stay (HOSPITAL_COMMUNITY): Payer: Medicare Other

## 2019-04-11 DIAGNOSIS — K219 Gastro-esophageal reflux disease without esophagitis: Secondary | ICD-10-CM

## 2019-04-11 DIAGNOSIS — R531 Weakness: Secondary | ICD-10-CM

## 2019-04-11 DIAGNOSIS — E871 Hypo-osmolality and hyponatremia: Secondary | ICD-10-CM

## 2019-04-11 DIAGNOSIS — J449 Chronic obstructive pulmonary disease, unspecified: Secondary | ICD-10-CM

## 2019-04-11 LAB — COMPREHENSIVE METABOLIC PANEL
ALT: 17 U/L (ref 0–44)
AST: 19 U/L (ref 15–41)
Albumin: 3 g/dL — ABNORMAL LOW (ref 3.5–5.0)
Alkaline Phosphatase: 89 U/L (ref 38–126)
Anion gap: 10 (ref 5–15)
BUN: 14 mg/dL (ref 8–23)
CO2: 18 mmol/L — ABNORMAL LOW (ref 22–32)
Calcium: 8.7 mg/dL — ABNORMAL LOW (ref 8.9–10.3)
Chloride: 106 mmol/L (ref 98–111)
Creatinine, Ser: 0.9 mg/dL (ref 0.44–1.00)
GFR calc Af Amer: >60 mL/min (ref >60)
GFR calc non Af Amer: 57 mL/min — ABNORMAL LOW (ref >60)
Glucose, Bld: 79 mg/dL (ref 70–99)
Potassium: 4.5 mmol/L (ref 3.5–5.1)
Sodium: 134 mmol/L — ABNORMAL LOW (ref 135–145)
Total Bilirubin: 0.5 mg/dL (ref 0.3–1.2)
Total Protein: 5.9 g/dL — ABNORMAL LOW (ref 6.5–8.1)

## 2019-04-11 LAB — CBC
HCT: 39.5 % (ref 36.0–46.0)
Hemoglobin: 12.3 g/dL (ref 12.0–15.0)
MCH: 29.1 pg (ref 26.0–34.0)
MCHC: 31.1 g/dL (ref 30.0–36.0)
MCV: 93.6 fL (ref 80.0–100.0)
Platelets: 207 10*3/uL (ref 150–400)
RBC: 4.22 MIL/uL (ref 3.87–5.11)
RDW: 16 % — ABNORMAL HIGH (ref 11.5–15.5)
WBC: 5.8 10*3/uL (ref 4.0–10.5)
nRBC: 0 % (ref 0.0–0.2)

## 2019-04-11 MED ORDER — LEVOFLOXACIN IN D5W 750 MG/150ML IV SOLN
750.0000 mg | INTRAVENOUS | Status: DC
  Administered 2019-04-11: 750 mg via INTRAVENOUS
  Filled 2019-04-11: qty 150

## 2019-04-11 MED ORDER — DEXTROSE-NACL 5-0.45 % IV SOLN
INTRAVENOUS | Status: DC
  Administered 2019-04-11: 02:00:00 via INTRAVENOUS

## 2019-04-11 MED ORDER — BOOST / RESOURCE BREEZE PO LIQD CUSTOM
1.0000 | Freq: Two times a day (BID) | ORAL | Status: DC
  Administered 2019-04-12 – 2019-04-14 (×5): 1 via ORAL
  Filled 2019-04-11: qty 1

## 2019-04-11 MED ORDER — DEXTROSE-NACL 5-0.9 % IV SOLN
INTRAVENOUS | Status: DC
  Administered 2019-04-11 – 2019-04-12 (×3): via INTRAVENOUS

## 2019-04-11 MED ORDER — VITAMIN D 25 MCG (1000 UNIT) PO TABS
2000.0000 [IU] | ORAL_TABLET | Freq: Every day | ORAL | Status: DC
  Administered 2019-04-11 – 2019-04-13 (×3): 2000 [IU] via ORAL
  Filled 2019-04-11 (×4): qty 2

## 2019-04-11 MED ORDER — ONDANSETRON HCL 4 MG PO TABS: 4.0000 mg | ORAL_TABLET | Freq: Four times a day (QID) | ORAL | Status: DC | PRN

## 2019-04-11 MED ORDER — IPRATROPIUM-ALBUTEROL 0.5-2.5 (3) MG/3ML IN SOLN: 3.0000 mL | RESPIRATORY_TRACT | Status: DC | PRN

## 2019-04-11 MED ORDER — ONDANSETRON HCL 4 MG/2ML IJ SOLN
4.0000 mg | Freq: Four times a day (QID) | INTRAMUSCULAR | Status: DC | PRN
  Administered 2019-04-11: 4 mg via INTRAVENOUS
  Filled 2019-04-11: qty 2

## 2019-04-11 MED ORDER — AMITRIPTYLINE HCL 10 MG PO TABS
10.0000 mg | ORAL_TABLET | Freq: Every day | ORAL | Status: DC
  Administered 2019-04-11 – 2019-04-13 (×4): 10 mg via ORAL
  Filled 2019-04-11 (×4): qty 1

## 2019-04-11 MED ORDER — ENOXAPARIN SODIUM 60 MG/0.6ML ~~LOC~~ SOLN
60.0000 mg | Freq: Every day | SUBCUTANEOUS | Status: DC
  Administered 2019-04-11: 60 mg via SUBCUTANEOUS
  Filled 2019-04-11 (×2): qty 0.6

## 2019-04-11 MED ORDER — ROSUVASTATIN CALCIUM 10 MG PO TABS
10.0000 mg | ORAL_TABLET | Freq: Every day | ORAL | Status: DC
  Administered 2019-04-11 – 2019-04-13 (×3): 10 mg via ORAL
  Filled 2019-04-11 (×3): qty 1

## 2019-04-11 MED ORDER — IBUPROFEN 200 MG PO TABS: 200.0000 mg | ORAL_TABLET | Freq: Four times a day (QID) | ORAL | Status: DC | PRN

## 2019-04-11 MED ORDER — HYDROCHLOROTHIAZIDE 25 MG PO TABS: 12.5000 mg | ORAL_TABLET | Freq: Every day | ORAL | Status: DC | PRN

## 2019-04-11 MED ORDER — ACETAMINOPHEN 325 MG PO TABS: 650.0000 mg | ORAL_TABLET | Freq: Four times a day (QID) | ORAL | Status: DC | PRN

## 2019-04-11 MED ORDER — DOCUSATE SODIUM 100 MG PO CAPS: 100.0000 mg | ORAL_CAPSULE | Freq: Every day | ORAL | Status: DC | PRN

## 2019-04-11 MED ORDER — ADULT MULTIVITAMIN W/MINERALS CH
1.0000 | ORAL_TABLET | Freq: Every day | ORAL | Status: DC
  Administered 2019-04-12 – 2019-04-14 (×2): 1 via ORAL
  Filled 2019-04-11 (×4): qty 1

## 2019-04-11 MED ORDER — ASPIRIN EC 81 MG PO TBEC
81.0000 mg | DELAYED_RELEASE_TABLET | Freq: Every day | ORAL | Status: DC
  Administered 2019-04-11 – 2019-04-13 (×3): 81 mg via ORAL
  Filled 2019-04-11 (×4): qty 1

## 2019-04-11 MED ORDER — ENSURE ENLIVE PO LIQD: 237.0000 mL | Freq: Two times a day (BID) | ORAL | Status: DC

## 2019-04-11 MED ORDER — TIMOLOL MALEATE 0.5 % OP SOLN
1.0000 [drp] | Freq: Two times a day (BID) | OPHTHALMIC | Status: DC
  Administered 2019-04-11 – 2019-04-14 (×7): 1 [drp] via OPHTHALMIC
  Filled 2019-04-11: qty 5

## 2019-04-11 MED ORDER — MOMETASONE FURO-FORMOTEROL FUM 200-5 MCG/ACT IN AERO
2.0000 | INHALATION_SPRAY | Freq: Two times a day (BID) | RESPIRATORY_TRACT | Status: DC
  Administered 2019-04-11 – 2019-04-14 (×7): 2 via RESPIRATORY_TRACT
  Filled 2019-04-11: qty 8.8

## 2019-04-11 MED ORDER — IPRATROPIUM-ALBUTEROL 0.5-2.5 (3) MG/3ML IN SOLN
3.0000 mL | Freq: Three times a day (TID) | RESPIRATORY_TRACT | Status: DC
  Filled 2019-04-11: qty 3

## 2019-04-11 MED ORDER — MECLIZINE HCL 25 MG PO TABS: 25.0000 mg | ORAL_TABLET | Freq: Three times a day (TID) | ORAL | Status: DC | PRN

## 2019-04-11 MED ORDER — PANTOPRAZOLE SODIUM 40 MG PO TBEC
40.0000 mg | DELAYED_RELEASE_TABLET | Freq: Every day | ORAL | Status: DC
  Administered 2019-04-11 – 2019-04-14 (×4): 40 mg via ORAL
  Filled 2019-04-11 (×4): qty 1

## 2019-04-11 MED ORDER — MEGESTROL ACETATE 400 MG/10ML PO SUSP
400.0000 mg | Freq: Every day | ORAL | Status: DC
  Filled 2019-04-11 (×4): qty 10

## 2019-04-11 MED ORDER — ONDANSETRON HCL 4 MG PO TABS: 4.0000 mg | ORAL_TABLET | Freq: Three times a day (TID) | ORAL | Status: DC | PRN

## 2019-04-11 MED ORDER — CEFDINIR 300 MG PO CAPS
600.0000 mg | ORAL_CAPSULE | Freq: Every day | ORAL | Status: DC
  Administered 2019-04-12 – 2019-04-13 (×2): 600 mg via ORAL
  Filled 2019-04-11 (×3): qty 2

## 2019-04-11 MED ORDER — IPRATROPIUM-ALBUTEROL 0.5-2.5 (3) MG/3ML IN SOLN: 3.0000 mL | Freq: Three times a day (TID) | RESPIRATORY_TRACT | Status: DC

## 2019-04-11 MED ORDER — ENOXAPARIN SODIUM 40 MG/0.4ML ~~LOC~~ SOLN
40.0000 mg | SUBCUTANEOUS | Status: DC
  Administered 2019-04-11 – 2019-04-13 (×3): 40 mg via SUBCUTANEOUS
  Filled 2019-04-11 (×3): qty 0.4

## 2019-04-11 MED ORDER — KETOROLAC TROMETHAMINE 0.5 % OP SOLN
1.0000 [drp] | Freq: Two times a day (BID) | OPHTHALMIC | Status: DC
  Administered 2019-04-11 – 2019-04-14 (×7): 1 [drp] via OPHTHALMIC
  Filled 2019-04-11: qty 5

## 2019-04-11 MED ORDER — DICLOFENAC SODIUM 75 MG PO TBEC
75.0000 mg | DELAYED_RELEASE_TABLET | Freq: Every day | ORAL | Status: DC | PRN
  Filled 2019-04-11: qty 1
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
## 2019-04-11 NOTE — Telephone Encounter (Signed)
Copied from [place] [phone]. Topic: General - Call Back - No Documentation >> Apr 11, 2019  1:30 PM Erick Blinks wrote: Reason for CRM: Pt's daughter Chandra Batch is requesting call back. Pt has not been eating, feels sick often, and is in need of home health or nursing home residence. [facility] possibly, or highest recommended. Please advise  [phone] VM okay for both Home Phone: [phone]

## 2019-04-11 NOTE — Telephone Encounter (Signed)
It appears she has been admitted to hospital. Disposition can be discussed in hospital as may need case mgt/SW assistance. Thanks.
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
## 2019-04-11 NOTE — Progress Notes (Signed)
Ok to optimize abx to cefdinir to complete 7d of therapy per Dr. Horris Latino. CrCl>30  Cefdinir 600mg  PO qday x 6  Onnie Boer, PharmD, [place], AAHIVP, CPP Infectious Disease Pharmacist 04/11/2019 3:05 PM

## 2019-04-11 NOTE — Progress Notes (Signed)
PROGRESS NOTE  Tina Mercado SPQ:[id number] DOB: 02-26-1929 DOA: 04/10/2019 PCP: Shawnee Knapp, MD  HPI/Recap of past 24 hours: HPI from Dr Truman Hayward is a 85 y.o. female with medical history significant of recent left total hip replacement who was in rehab for 19 days but now at home for 2 weeks, COPD, GERD, poor vision and hearing, diastolic dysfunction, hypertension and hyperlipidemia who was brought in secondary to generalized weakness and anorexia.  Patient reported anorexia even before she left rehab. Pt continues to get weak and debilitated.  She denied any fever or chills.  Denied any sick contacts.  Patient has progressively gotten weak to where she is unable to do anything. She has lost appetite completely.  She also has had abnormal taste of any food so she stopped eating.  In the ED she is fully awake and alert and communicating just global weakness.  Patient was being evaluated and as she stood up to walk her oxygen sats dropped into the 80s and so she is being admitted to the hospital for work-up of hypoxia. In the ED, Temperature is 98.8 blood pressure 167/66 pulse 110 respiratory 23 and oxygen sat 83% on room air.  CBC and chemistry are largely within normal.  D-dimer of 2.06 and urinalysis negative.  COVID-19 testing also negative.  Chest x-ray showed increased retrocardiac opacity probably gastric contents with superimposed airspace disease difficulty exclude.  Suspected small left pleural effusion which is new and chronic hyper inflammation.  Patient could not get CT angiogram due to severe IVP dye allergy.  She is being admitted with questionable PE versus pneumonia.   Today pt still reports generalized weakness, nausea, unable to tolerate orally. Denies any chest pain, abdominal pain.   Assessment/Plan: Principal Problem:   Hypoxemia Active Problems:   History of anemia   GERD (gastroesophageal reflux disease)   COPD mixed type (HCC)   Hyponatremia   Weakness   Acute hypoxemic respiratory failure  Likely 2/2 ?HCAP, PE ruled out Afebrile, no leukocytosis Urine strep pneumo pending collection Chest x-ray showed increased retrocardiac opacity which may be in part due to gastric contents related to known large hiatal hernia, however superimposed airspace disease difficult to exclude in this setting VQ scan showed: Very low probability for acute pulmonary embolism.  Continue Levaquin, DuoNeb, inhaler Stop therapeutic dose of Lovenox Monitor closely  Generalized weakness/deconditioning/anorexia Multifactorial: Recent surgery, poor oral intake Dietitian consulted PT/OT Continue Megace  Hyponatremia Likely due to poor oral intake Continue IV fluids Daily BMP  Recent left hip replacement Continue PT/OT  COPD Continue DuoNeb, inhalers  GERD Continue PPI          Malnutrition Type:      Malnutrition Characteristics:      Nutrition Interventions:       Estimated body mass index is 22.78 kg/m as calculated from the following:   Height as of this encounter: 5\' 2"  (1.575 m).   Weight as of this encounter: 56.5 kg.     Code Status: Full  Family Communication: None at bedside  Disposition Plan: ?SNF   Consultants:  NOne  Procedures:  None  Antimicrobials:  Levaquin  DVT prophylaxis: Lovenox   Objective: Vitals:   04/11/19 0000 04/11/19 0121 04/11/19 0504 04/11/19 0830  BP: (!) 141/60 137/89 (!) 146/59   Pulse: 76 82 84   Resp: 17 18 18    Temp:  98.5 F (36.9 C) 98.6 F (37 C)   TempSrc:  Oral Oral   SpO2:  93% 98% 95% 97%  Weight: 56.7 kg 56.5 kg    Height:  5\' 2"  (1.575 m)      Intake/Output Summary (Last 24 hours) at 04/11/2019 1138 Last data filed at 04/11/2019 0600 Gross per 24 hour  Intake 1302.47 ml  Output -  Net 1302.47 ml   Filed Weights   04/11/19 0000 04/11/19 0121  Weight: 56.7 kg 56.5 kg    Exam:  General: NAD   Cardiovascular: S1, S2 present  Respiratory: CTAB   Abdomen: Soft, nontender, nondistended, bowel sounds present  Musculoskeletal: No bilateral pedal edema noted  Skin: Normal  Psychiatry: Normal mood   Data Reviewed: CBC: Recent Labs  Lab 04/10/19 1622 04/11/19 0426  WBC 6.9 5.8  NEUTROABS 5.2  --   HGB 13.4 12.3  HCT 41.3 39.5  MCV 91.2 93.6  PLT 220 007   Basic Metabolic Panel: Recent Labs  Lab 04/10/19 1622 04/11/19 0426  NA 130* 134*  K 5.0 4.5  CL 103 106  CO2 18* 18*  GLUCOSE 96 79  BUN 20 14  CREATININE 1.15* 0.90  CALCIUM 8.9 8.7*  MG 2.0  --    GFR: Estimated Creatinine Clearance: 34.2 mL/min (by C-G formula based on SCr of 0.9 mg/dL). Liver Function Tests: Recent Labs  Lab 04/10/19 1622 04/11/19 0426  AST 27 19  ALT 17 17  ALKPHOS 104 89  BILITOT 0.4 0.5  PROT 6.7 5.9*  ALBUMIN 3.6 3.0*   Recent Labs  Lab 04/10/19 1622  LIPASE 38   No results for input(s): AMMONIA in the last 168 hours. Coagulation Profile: No results for input(s): INR, PROTIME in the last 168 hours. Cardiac Enzymes: No results for input(s): CKTOTAL, CKMB, CKMBINDEX, TROPONINI in the last 168 hours. BNP (last 3 results) No results for input(s): PROBNP in the last 8760 hours. HbA1C: No results for input(s): HGBA1C in the last 72 hours. CBG: No results for input(s): GLUCAP in the last 168 hours. Lipid Profile: No results for input(s): CHOL, HDL, LDLCALC, TRIG, CHOLHDL, LDLDIRECT in the last 72 hours. Thyroid Function Tests: No results for input(s): TSH, T4TOTAL, FREET4, T3FREE, THYROIDAB in the last 72 hours. Anemia Panel: No results for input(s): VITAMINB12, FOLATE, FERRITIN, TIBC, IRON, RETICCTPCT in the last 72 hours. Urine analysis:    Component Value Date/Time   COLORURINE YELLOW 04/10/2019 [place] 04/10/2019 1558   LABSPEC 1.009 04/10/2019 1558   PHURINE 6.0 04/10/2019 1558   GLUCOSEU NEGATIVE 04/10/2019 1558   HGBUR NEGATIVE 04/10/2019 1558   BILIRUBINUR NEGATIVE 04/10/2019 1558    BILIRUBINUR negative 10/15/2018 1307   KETONESUR NEGATIVE 04/10/2019 1558   PROTEINUR NEGATIVE 04/10/2019 1558   UROBILINOGEN 0.2 10/15/2018 1307   NITRITE NEGATIVE 04/10/2019 1558   LEUKOCYTESUR NEGATIVE 04/10/2019 1558   Sepsis Labs: @LABRCNTIP (procalcitonin:4,lacticidven:4)  ) Recent Results (from the past 240 hour(s))  SARS Coronavirus 2 (CEPHEID- Performed in [place] hospital lab), Hosp Order     Status: None   Collection Time: 04/10/19  9:05 PM   Specimen: Nasopharyngeal Swab  Result Value Ref Range Status   SARS Coronavirus 2 NEGATIVE NEGATIVE Final    Comment: (NOTE) If result is NEGATIVE SARS-CoV-2 target nucleic acids are NOT DETECTED. The SARS-CoV-2 RNA is generally detectable in upper and lower  respiratory specimens during the acute phase of infection. The lowest  concentration of SARS-CoV-2 viral copies this assay can detect is 250  copies / mL. A negative result does not preclude SARS-CoV-2 infection  and should not  be used as the sole basis for treatment or other  patient management decisions.  A negative result may occur with  improper specimen collection / handling, submission of specimen other  than nasopharyngeal swab, presence of viral mutation(s) within the  areas targeted by this assay, and inadequate number of viral copies  (<250 copies / mL). A negative result must be combined with clinical  observations, patient history, and epidemiological information. If result is POSITIVE SARS-CoV-2 target nucleic acids are DETECTED. The SARS-CoV-2 RNA is generally detectable in upper and lower  respiratory specimens dur ing the acute phase of infection.  Positive  results are indicative of active infection with SARS-CoV-2.  Clinical  correlation with patient history and other diagnostic information is  necessary to determine patient infection status.  Positive results do  not rule out bacterial infection or co-infection with other viruses. If result is  PRESUMPTIVE POSTIVE SARS-CoV-2 nucleic acids MAY BE PRESENT.   A presumptive positive result was obtained on the submitted specimen  and confirmed on repeat testing.  While 2019 novel coronavirus  (SARS-CoV-2) nucleic acids may be present in the submitted sample  additional confirmatory testing may be necessary for epidemiological  and / or clinical management purposes  to differentiate between  SARS-CoV-2 and other Sarbecovirus currently known to infect humans.  If clinically indicated additional testing with an alternate test  methodology [phone]) is advised. The SARS-CoV-2 RNA is generally  detectable in upper and lower respiratory sp ecimens during the acute  phase of infection. The expected result is Negative. Fact Sheet for Patients:  StrictlyIdeas.no Fact Sheet for Healthcare Providers: BankingDealers.co.za This test is not yet approved or cleared by the [place] FDA and has been authorized for detection and/or diagnosis of SARS-CoV-2 by FDA under an Emergency Use Authorization (EUA).  This EUA will remain in effect (meaning this test can be used) for the duration of the COVID-19 declaration under Section 564(b)(1) of the Act, 21 U.S.C. section 360bbb-3(b)(1), unless the authorization is terminated or revoked sooner. Performed at [facility], [place] [address] [place], [place] [address]       Studies: Dg Chest Port 1 View  Result Date: 04/10/2019 CLINICAL DATA:  Weakness. EXAM: PORTABLE CHEST 1 VIEW COMPARISON:  Radiograph 03/02/2019, chest CT 04/01/2016 FINDINGS: Chronic hyperinflation. Retrocardiac opacity, which is slightly more dense than on prior exam, patient with known large retrocardiac hiatal hernia. Mild blunting of the left costophrenic angle and obscuration of the hemidiaphragm. Lungs elsewhere clear. No pulmonary edema or pneumothorax. The bones are under mineralized. IMPRESSION: 1. Increased  retrocardiac opacity which may be in part due to gastric contents related to known large hiatal hernia, however superimposed airspace disease difficult to exclude in this setting. 2. Suspected small left pleural effusion which is new. 3. Chronic hyperinflation. Electronically Signed   By: Keith Rake M.D.   On: 04/10/2019 19:50    Scheduled Meds: . amitriptyline  10 mg Oral QHS  . aspirin EC  81 mg Oral QHS  . cholecalciferol  2,000 Units Oral QHS  . enoxaparin (LOVENOX) injection  60 mg Subcutaneous QHS  . feeding supplement (ENSURE ENLIVE)  237 mL Oral BID BM  . ketorolac  1 drop Right Eye BID  . megestrol  400 mg Oral Daily  . mometasone-formoterol  2 puff Inhalation BID  . multivitamin with minerals  1 tablet Oral Daily  . pantoprazole  40 mg Oral Daily  . rosuvastatin  10 mg Oral q1800  . timolol  1 drop Both Eyes BID    Continuous Infusions: . dextrose 5 % and 0.45% NaCl 75 mL/hr at 04/11/19 0222  . levofloxacin (LEVAQUIN) IV 750 mg (04/11/19 0223)     LOS: 1 day     Alma Friendly, MD [facility]  If 7PM-7AM, please contact night-coverage [email or web address] 04/11/2019, 11:38 AM

## 2019-04-11 NOTE — Progress Notes (Signed)
Tina Mercado states that she can"t eat because the thought of eating or smelling food makes her nauseous. Plus the taste of food , anything she drinks has a "bitter taste".

## 2019-04-11 NOTE — Plan of Care (Signed)
Plan of care reviewed and discussed with the patient. 

## 2019-04-11 NOTE — Progress Notes (Signed)
Initial Nutrition Assessment  DOCUMENTATION CODES:   Not applicable  INTERVENTION:  - will d/c Ensure. - will order Boost Breeze po TID, each supplement provides 250 kcal and 9 grams of protein - will order Magic Cup BID with meals, each supplement provides 290 kcal and 9 grams of protein. - continue to encourage PO intakes. - will continue to monitor for additional nutrition-related needs.    NUTRITION DIAGNOSIS:   Inadequate oral intake related to acute illness, nausea, poor appetite as evidenced by per patient/family report.  GOAL:   Patient will meet greater than or equal to 90% of their needs  MONITOR:   PO intake, Supplement acceptance, Labs, Weight trends  REASON FOR ASSESSMENT:   Malnutrition Screening Tool, Consult Assessment of nutrition requirement/status, Poor PO  ASSESSMENT:   85 year-old female with medical history significant of recent L total hip replacement, COPD, GERD, poor vision and hearing, diastolic dysfunction, HTN, and hyperlipidemia who was brought in 2/2 generalized weakness and anorexia. Weakness and debility have been progressive. CXR showed increased retrocardiac opacity probably gastric contents with superimposed airspace disease. Suspected new small L pleural effusion and chronic hyper-inflammation. She was admitted with questionable PE versus pneumonia.  No intakes documented since admission. Patient reports that she went to rehab for ~3 weeks after surgery and then has been home for the past 2 weeks. She states that during time at rehab her appetite was fair but that she was still able to eat sometime at all meals without issue.   She states that since returning home 2 weeks ago she has noted decline and that she has been feeling progressively weaker and that she has completely lost her appetite and nothing sounds or tastes good. She reports having taste alterations (bitter taste) which began after returning home, but is unsure of any medication  changes or other changes in her life which could be contributing/leading to taste change.   Ensure was ordered BID but patient refusing supplement. Will trial supplements as outlined above. Noted that megace was ordered starting today.   Per chart review, current weight is 124 lb and weight has been stable over the past 2 months. Weight on 10/15/18 was 129 lb which indicates 5 lb weight loss (4% body weight) in the past 6 months; not significant for time frame. Patient does not meet criteria for malnutrition at this time, but is at high risk.     Medications reviewed; 2000 units vitamin D3/day, 400 mg megace/day, daily multivitamin with minerals. Labs reviewed; Na: 134 mmol/l, Ca: 8.7 mg/dl, GFR: 57 ml/min.  IVF; D5-NS @ 75 ml/hr (306 kcal).     NUTRITION - FOCUSED PHYSICAL EXAM:  completed; no muscle and no fat wasting.   Diet Order:   Diet Order            Diet regular Room service appropriate? Yes; Fluid consistency: Thin  Diet effective now              EDUCATION NEEDS:   No education needs have been identified at this time  Skin:  Skin Assessment: Reviewed RN Assessment  Last BM:  6/13 (the day PTA)  Height:   Ht Readings from Last 1 Encounters:  04/11/19 5\' 2"  (1.575 m)    Weight:   Wt Readings from Last 1 Encounters:  04/11/19 56.5 kg    Ideal Body Weight:  50 kg  BMI:  Body mass index is 22.78 kg/m.  Estimated Nutritional Needs:   Kcal:  1580-1810 kcal  Protein:  65-75 grams  Fluid:  >/= 1.7 L/day     Jarome Matin, MS, RD, LDN, [facility] Inpatient Clinical Dietitian Pager # [phone] After hours/weekend pager # [phone]

## 2019-04-11 NOTE — Evaluation (Signed)
Physical Therapy Evaluation Patient Details Name: Tina Mercado MRN: [id number] DOB: 1929-10-24 Today's Date: 04/11/2019   History of Present Illness  85 y.o. female with medical history significant of recent left femur IM nail who was in rehab for 19 days but now at home for 2 weeks, COPD, GERD, poor vision and hearing, diastolic dysfunction, hypertension and hyperlipidemia who was brought in secondary to generalized weakness and anorexia.  Clinical Impression  Pt admitted with above diagnosis. Pt currently with functional limitations due to the deficits listed below (see PT Problem List).  Pt will benefit from skilled PT to increase their independence and safety with mobility to allow discharge to the venue listed below.   Pt reports generalized weakness any mobility and fatigues quickly. Pt also reports no appetite and a "bitter" taste with any bites of food. Pt also states she had not had much fluid intake either.  Pt encouraged to eat and educated on calories for mobility and protein for strength.  Pt very pleasant and cooperative.  Pt reports she has been active with HHPT.  Recommend resuming HHPT if pt has 24/7 assist for safety at home (since she reports 2 recent falls).  Pt states her daughter from [place] is able to stay with her through the end of June.  If pt does not have assist available at home, would benefit from SNF.     Follow Up Recommendations Home health PT;Supervision/Assistance - 24 hour    Equipment Recommendations  None recommended by PT    Recommendations for Other Services       Precautions / Restrictions Precautions Precautions: Fall Restrictions Other Position/Activity Restrictions: WBAT L LE per last admission      Mobility  Bed Mobility Overal bed mobility: Needs Assistance Bed Mobility: Supine to Sit;Sit to Supine     Supine to sit: Min assist Sit to supine: Mod assist   General bed mobility comments: assist for trunk upright, assist for LEs onto  bed  Transfers Overall transfer level: Needs assistance Equipment used: Rolling walker (2 wheeled) Transfers: Sit to/from Stand Sit to Stand: Min guard         General transfer comment: pt with good hand placement, increased time and effort, min/guard for safety  Ambulation/Gait Ambulation/Gait assistance: Min guard Gait Distance (Feet): 40 Feet Assistive device: Rolling walker (2 wheeled) Gait Pattern/deviations: Step-through pattern;Decreased stance time - left;Antalgic     General Gait Details: verbal cues for RW positioning and posture, pt reports generalized weakness and fatigue limiting mobility  Stairs            Wheelchair Mobility    Modified Rankin (Stroke Patients Only)       Balance Overall balance assessment: History of Falls(reports 2 recent falls)                                           Pertinent Vitals/Pain Pain Assessment: No/denies pain    Home Living Family/patient expects to be discharged to:: Private residence   Available Help at Discharge: Family;Available 24 hours/day(daughter can stay with pt until end of June) Type of Home: House Home Access: Stairs to enter Entrance Stairs-Rails: Right Entrance Stairs-Number of Steps: 2 Home Layout: One level Home Equipment: Clinical cytogeneticist - 4 wheels;Cane - single point;Grab bars - tub/shower;Walker - 2 wheels      Prior Function Level of Independence: Independent with assistive device(s)  Comments: recently at SNF and discharged home with HHPT (s/p L IM nail)     Hand Dominance        Extremity/Trunk Assessment        Lower Extremity Assessment Lower Extremity Assessment: LLE deficits/detail LLE Deficits / Details: pt self assists L LE with bed mobility, reports occasional pain with increased motion       Communication   Communication: HOH  Cognition Arousal/Alertness: Awake/alert Behavior During Therapy: WFL for tasks  assessed/performed Overall Cognitive Status: Within Functional Limits for tasks assessed                                        General Comments      Exercises     Assessment/Plan    PT Assessment Patient needs continued PT services  PT Problem List Decreased strength;Decreased mobility;Decreased knowledge of use of DME;Decreased balance;Decreased activity tolerance       PT Treatment Interventions DME instruction;Therapeutic activities;Therapeutic exercise;Gait training;Patient/family education;Stair training;Balance training;Functional mobility training    PT Goals (Current goals can be found in the Care Plan section)  Acute Rehab PT Goals PT Goal Formulation: With patient Time For Goal Achievement: 04/18/19 Potential to Achieve Goals: Good    Frequency Min 3X/week   Barriers to discharge        Co-evaluation               AM-PAC PT "6 Clicks" Mobility  Outcome Measure Help needed turning from your back to your side while in a flat bed without using bedrails?: A Little Help needed moving from lying on your back to sitting on the side of a flat bed without using bedrails?: A Little Help needed moving to and from a bed to a chair (including a wheelchair)?: A Little Help needed standing up from a chair using your arms (e.g., wheelchair or bedside chair)?: A Little Help needed to walk in hospital room?: A Little Help needed climbing 3-5 steps with a railing? : A Lot 6 Click Score: 17    End of Session Equipment Utilized During Treatment: Gait belt Activity Tolerance: Patient tolerated treatment well Patient left: in bed;with call bell/phone within reach;with bed alarm set Nurse Communication: Mobility status PT Visit Diagnosis: Muscle weakness (generalized) (M62.81)    Time: 2440-1027 PT Time Calculation (min) (ACUTE ONLY): 24 min   Charges:   PT Evaluation $PT Eval Low Complexity: [place], PT, DPT Acute  Rehabilitation Services Office: [phone] Pager: [phone]  Trena Platt 04/11/2019, 4:06 PM

## 2019-04-12 LAB — BASIC METABOLIC PANEL
Anion gap: 6 (ref 5–15)
BUN: 10 mg/dL (ref 8–23)
CO2: 18 mmol/L — ABNORMAL LOW (ref 22–32)
Calcium: 8.6 mg/dL — ABNORMAL LOW (ref 8.9–10.3)
Chloride: 112 mmol/L — ABNORMAL HIGH (ref 98–111)
Creatinine, Ser: 0.87 mg/dL (ref 0.44–1.00)
GFR calc Af Amer: >60 mL/min (ref >60)
GFR calc non Af Amer: 59 mL/min — ABNORMAL LOW (ref >60)
Glucose, Bld: 113 mg/dL — ABNORMAL HIGH (ref 70–99)
Potassium: 3.8 mmol/L (ref 3.5–5.1)
Sodium: 136 mmol/L (ref 135–145)

## 2019-04-12 LAB — CBC WITH DIFFERENTIAL/PLATELET
Abs Immature Granulocytes: 0.06 10*3/uL (ref 0.00–0.07)
Basophils Absolute: 0 10*3/uL (ref 0.0–0.1)
Basophils Relative: 0 %
Eosinophils Absolute: 0.4 10*3/uL (ref 0.0–0.5)
Eosinophils Relative: 9 %
HCT: 37.9 % (ref 36.0–46.0)
Hemoglobin: 11.9 g/dL — ABNORMAL LOW (ref 12.0–15.0)
Immature Granulocytes: 1 %
Lymphocytes Relative: 17 %
Lymphs Abs: 0.9 10*3/uL (ref 0.7–4.0)
MCH: 29.5 pg (ref 26.0–34.0)
MCHC: 31.4 g/dL (ref 30.0–36.0)
MCV: 93.8 fL (ref 80.0–100.0)
Monocytes Absolute: 0.4 10*3/uL (ref 0.1–1.0)
Monocytes Relative: 8 %
Neutro Abs: 3.3 10*3/uL (ref 1.7–7.7)
Neutrophils Relative %: 65 %
Platelets: 230 10*3/uL (ref 150–400)
RBC: 4.04 MIL/uL (ref 3.87–5.11)
RDW: 16 % — ABNORMAL HIGH (ref 11.5–15.5)
WBC: 5.1 10*3/uL (ref 4.0–10.5)
nRBC: 0 % (ref 0.0–0.2)

## 2019-04-12 LAB — STREP PNEUMONIAE URINARY ANTIGEN: Strep Pneumo Urinary Antigen: NEGATIVE

## 2019-04-12 NOTE — Evaluation (Signed)
Occupational Therapy Evaluation Patient Details Name: Tina Mercado MRN: [id number] DOB: 08/23/1929 Today's Date: 04/12/2019    History of Present Illness 85 y.o. female with medical history significant of recent left femur IM nail who was in rehab for 19 days but now at home for 2 weeks, COPD, GERD, poor vision and hearing, diastolic dysfunction, hypertension and hyperlipidemia who was brought in secondary to generalized weakness and anorexia.   Clinical Impression   Pt was admitted for the above.  She states that her falls were mechanical. Pt fatiques easily and needs min guard assist to ambulate and up to mod A for LB dressing. Will follow in acute setting with supervision level goals.     Follow Up Recommendations  SNF(unless pt can have 24/7 then [facility])    Equipment Recommendations  None recommended by OT    Recommendations for Other Services       Precautions / Restrictions Precautions Precautions: Fall Restrictions Weight Bearing Restrictions: No Other Position/Activity Restrictions: WBAT L LE per last admission      Mobility Bed Mobility         Supine to sit: Min assist     General bed mobility comments: light assist for trunk to get OOB, HOB raised  Transfers   Equipment used: Rolling walker (2 wheeled)   Sit to Stand: Min guard         General transfer comment: for safety    Balance                                           ADL either performed or assessed with clinical judgement   ADL Overall ADL's : Needs assistance/impaired Eating/Feeding: Independent   Grooming: Oral care;Wash/dry hands;Min guard;Standing   Upper Body Bathing: Set up   Lower Body Bathing: Minimal assistance;Sit to/from stand   Upper Body Dressing : Set up   Lower Body Dressing: Moderate assistance;Sit to/from stand   Toilet Transfer: Min guard;Ambulation;BSC;RW   Toileting- Water quality scientist and Hygiene: Min guard;Sit to/from stand          General ADL Comments: ambulated to bathroom, stood for grooming.  Pt fatiques quickly     Vision         Perception     Praxis      Pertinent Vitals/Pain Pain Assessment: No/denies pain     Hand Dominance     Extremity/Trunk Assessment Upper Extremity Assessment Upper Extremity Assessment: Generalized weakness           Communication Communication Communication: HOH   Cognition Arousal/Alertness: Awake/alert Behavior During Therapy: WFL for tasks assessed/performed Overall Cognitive Status: Within Functional Limits for tasks assessed                                     General Comments       Exercises     Shoulder Instructions      Home Living Family/patient expects to be discharged to:: Private residence Living Arrangements: Alone Available Help at Discharge: Family;Available 24 hours/day               Bathroom Shower/Tub: Teacher, early years/pre: Standard     Home Equipment: Clinical cytogeneticist - 4 wheels;Cane - single point;Grab bars - tub/shower;Walker - 2 wheels  Prior Functioning/Environment Level of Independence: Independent with assistive device(s)        Comments: recently at SNF and discharged home with HHPT (s/p L IM nail)        OT Problem List: Decreased strength;Decreased activity tolerance;Impaired balance (sitting and/or standing);Decreased knowledge of use of DME or AE      OT Treatment/Interventions: Self-care/ADL training;DME and/or AE instruction;Energy conservation;Balance training;Patient/family education;Therapeutic activities    OT Goals(Current goals can be found in the care plan section) Acute Rehab OT Goals Patient Stated Goal: get strength back OT Goal Formulation: With patient Time For Goal Achievement: 04/26/19 Potential to Achieve Goals: Good ADL Goals Pt Will Transfer to Toilet: with supervision;ambulating(comfort height) Pt Will Perform Toileting - Clothing  Manipulation and hygiene: with supervision;sit to/from stand Additional ADL Goal #1: pt will complete adl at supervision/set up level Additional ADL Goal #2: pt will initiate at least one rest break without cues for energy conservation  OT Frequency: Min 2X/week   Barriers to D/C:            Co-evaluation              AM-PAC OT "6 Clicks" Daily Activity     Outcome Measure Help from another person eating meals?: None Help from another person taking care of personal grooming?: A Little Help from another person toileting, which includes using toliet, bedpan, or urinal?: A Little Help from another person bathing (including washing, rinsing, drying)?: A Little Help from another person to put on and taking off regular upper body clothing?: A Little Help from another person to put on and taking off regular lower body clothing?: A Lot 6 Click Score: 18   End of Session    Activity Tolerance: Patient limited by fatigue Patient left: in chair;with call bell/phone within reach;with chair alarm set  OT Visit Diagnosis: Muscle weakness (generalized) (M62.81);Unsteadiness on feet (R26.81)                Time: 8309-4076 OT Time Calculation (min): 23 min Charges:  OT General Charges $OT Visit: 1 Visit OT Evaluation $OT Eval Low Complexity: [place], OTR/L Acute Rehabilitation Services [phone] [facility] pager [phone] office 04/12/2019  [place] 04/12/2019, 12:46 PM

## 2019-04-12 NOTE — Progress Notes (Signed)
PROGRESS NOTE  Tina Mercado MSX:[id number] DOB: [birth year] DOA: 04/10/2019 PCP: Shawnee Knapp, MD  HPI/Recap of past 24 hours: HPI from Dr Truman Hayward is a 85 y.o. female with medical history significant of recent left total hip replacement who was in rehab for 19 days but now at home for 2 weeks, COPD, GERD, poor vision and hearing, diastolic dysfunction, hypertension and hyperlipidemia who was brought in secondary to generalized weakness and anorexia.  Patient reported anorexia even before she left rehab. Pt continues to get weak and debilitated.  She denied any fever or chills.  Denied any sick contacts.  Patient has progressively gotten weak to where she is unable to do anything. She has lost appetite completely.  She also has had abnormal taste of any food so she stopped eating.  In the ED she is fully awake and alert and communicating just global weakness.  Patient was being evaluated and as she stood up to walk her oxygen sats dropped into the 80s and so she is being admitted to the hospital for work-up of hypoxia. In the ED, Temperature is 98.8 blood pressure 167/66 pulse 110 respiratory 23 and oxygen sat 83% on room air.  CBC and chemistry are largely within normal.  D-dimer of 2.06 and urinalysis negative.  COVID-19 testing also negative.  Chest x-ray showed increased retrocardiac opacity probably gastric contents with superimposed airspace disease difficulty exclude.  Suspected small left pleural effusion which is new and chronic hyper inflammation.  Patient could not get CT angiogram due to severe IVP dye allergy.  She is being admitted with questionable PE versus pneumonia.   Today, met patient sleeping, easily arousable.  Denies any other new complaints.  Assessment/Plan: Principal Problem:   Hypoxemia Active Problems:   History of anemia   GERD (gastroesophageal reflux disease)   COPD mixed type (HCC)   Hyponatremia   Weakness  Acute hypoxemic respiratory failure   Likely 2/2 ?HCAP, PE ruled out Afebrile, no leukocytosis Urine strep pneumo negative Chest x-ray showed increased retrocardiac opacity which may be in part due to gastric contents related to known large hiatal hernia, however superimposed airspace disease difficult to exclude in this setting VQ scan showed: Very low probability for acute pulmonary embolism.  Switched Levaquin--> Cefdinir DuoNeb, inhaler  Generalized weakness/deconditioning/anorexia Multifactorial: Recent surgery, poor oral intake Dietitian consulted PT/OT Continue Megace  Hyponatremia Resolved Likely due to poor oral intake S/P IV fluids  Recent left hip replacement Continue PT/OT  COPD Continue DuoNeb, inhalers  GERD Continue PPI          Malnutrition Type:  Nutrition Problem: Inadequate oral intake Etiology: acute illness, nausea, poor appetite   Malnutrition Characteristics:  Signs/Symptoms: per patient/family report   Nutrition Interventions:  Interventions: Boost Breeze, Magic cup, MVI    Estimated body mass index is 22.78 kg/m as calculated from the following:   Height as of this encounter: '5\' 2"'  (1.575 m).   Weight as of this encounter: 56.5 kg.     Code Status: Full  Family Communication: None at bedside  Disposition Plan: SNF   Consultants:  None  Procedures:  None  Antimicrobials:  Cefdinir  DVT prophylaxis: Lovenox   Objective: Vitals:   04/11/19 2121 04/12/19 0410 04/12/19 0809 04/12/19 1240  BP: (!) 135/58 (!) 160/70  134/70  Pulse: 80 80  83  Resp: 17 16    Temp: 98.5 F (36.9 C) 97.9 F (36.6 C)  97.6 F (36.4 C)  TempSrc: Oral  Oral  SpO2: 94% 95% 96% 96%  Weight:      Height:        Intake/Output Summary (Last 24 hours) at 04/12/2019 1642 Last data filed at 04/12/2019 1500 Gross per 24 hour  Intake 1435.39 ml  Output 501 ml  Net 934.39 ml   Filed Weights   04/11/19 0000 04/11/19 0121  Weight: 56.7 kg 56.5 kg    Exam:   General: NAD   Cardiovascular: S1, S2 present  Respiratory: CTAB  Abdomen: Soft, nontender, nondistended, bowel sounds present  Musculoskeletal: No bilateral pedal edema noted  Skin: Normal  Psychiatry: Normal mood   Data Reviewed: CBC: Recent Labs  Lab 04/10/19 1622 04/11/19 0426 04/12/19 0429  WBC 6.9 5.8 5.1  NEUTROABS 5.2  --  3.3  HGB 13.4 12.3 11.9*  HCT 41.3 39.5 37.9  MCV 91.2 93.6 93.8  PLT 220 207 096   Basic Metabolic Panel: Recent Labs  Lab 04/10/19 1622 04/11/19 0426 04/12/19 0429  NA 130* 134* 136  K 5.0 4.5 3.8  CL 103 106 112*  CO2 18* 18* 18*  GLUCOSE 96 79 113*  BUN '20 14 10  ' CREATININE 1.15* 0.90 0.87  CALCIUM 8.9 8.7* 8.6*  MG 2.0  --   --    GFR: Estimated Creatinine Clearance: 35.4 mL/min (by C-G formula based on SCr of 0.87 mg/dL). Liver Function Tests: Recent Labs  Lab 04/10/19 1622 04/11/19 0426  AST 27 19  ALT 17 17  ALKPHOS 104 89  BILITOT 0.4 0.5  PROT 6.7 5.9*  ALBUMIN 3.6 3.0*   Recent Labs  Lab 04/10/19 1622  LIPASE 38   No results for input(s): AMMONIA in the last 168 hours. Coagulation Profile: No results for input(s): INR, PROTIME in the last 168 hours. Cardiac Enzymes: No results for input(s): CKTOTAL, CKMB, CKMBINDEX, TROPONINI in the last 168 hours. BNP (last 3 results) No results for input(s): PROBNP in the last 8760 hours. HbA1C: No results for input(s): HGBA1C in the last 72 hours. CBG: No results for input(s): GLUCAP in the last 168 hours. Lipid Profile: No results for input(s): CHOL, HDL, LDLCALC, TRIG, CHOLHDL, LDLDIRECT in the last 72 hours. Thyroid Function Tests: No results for input(s): TSH, T4TOTAL, FREET4, T3FREE, THYROIDAB in the last 72 hours. Anemia Panel: No results for input(s): VITAMINB12, FOLATE, FERRITIN, TIBC, IRON, RETICCTPCT in the last 72 hours. Urine analysis:    Component Value Date/Time   COLORURINE YELLOW 04/10/2019 [place] 04/10/2019 1558   LABSPEC  1.009 04/10/2019 1558   PHURINE 6.0 04/10/2019 1558   GLUCOSEU NEGATIVE 04/10/2019 1558   HGBUR NEGATIVE 04/10/2019 1558   BILIRUBINUR NEGATIVE 04/10/2019 1558   BILIRUBINUR negative 10/15/2018 1307   KETONESUR NEGATIVE 04/10/2019 1558   PROTEINUR NEGATIVE 04/10/2019 1558   UROBILINOGEN 0.2 10/15/2018 1307   NITRITE NEGATIVE 04/10/2019 1558   LEUKOCYTESUR NEGATIVE 04/10/2019 1558   Sepsis Labs: '@LABRCNTIP' (procalcitonin:4,lacticidven:4)  ) Recent Results (from the past 240 hour(s))  SARS Coronavirus 2 (CEPHEID- Performed in [facility] hospital lab), Hosp Order     Status: None   Collection Time: 04/10/19  9:05 PM   Specimen: Nasopharyngeal Swab  Result Value Ref Range Status   SARS Coronavirus 2 NEGATIVE NEGATIVE Final    Comment: (NOTE) If result is NEGATIVE SARS-CoV-2 target nucleic acids are NOT DETECTED. The SARS-CoV-2 RNA is generally detectable in upper and lower  respiratory specimens during the acute phase of infection. The lowest  concentration of SARS-CoV-2 viral copies this assay  can detect is 250  copies / mL. A negative result does not preclude SARS-CoV-2 infection  and should not be used as the sole basis for treatment or other  patient management decisions.  A negative result may occur with  improper specimen collection / handling, submission of specimen other  than nasopharyngeal swab, presence of viral mutation(s) within the  areas targeted by this assay, and inadequate number of viral copies  (<250 copies / mL). A negative result must be combined with clinical  observations, patient history, and epidemiological information. If result is POSITIVE SARS-CoV-2 target nucleic acids are DETECTED. The SARS-CoV-2 RNA is generally detectable in upper and lower  respiratory specimens dur ing the acute phase of infection.  Positive  results are indicative of active infection with SARS-CoV-2.  Clinical  correlation with patient history and other diagnostic information  is  necessary to determine patient infection status.  Positive results do  not rule out bacterial infection or co-infection with other viruses. If result is PRESUMPTIVE POSTIVE SARS-CoV-2 nucleic acids MAY BE PRESENT.   A presumptive positive result was obtained on the submitted specimen  and confirmed on repeat testing.  While 2019 novel coronavirus  (SARS-CoV-2) nucleic acids may be present in the submitted sample  additional confirmatory testing may be necessary for epidemiological  and / or clinical management purposes  to differentiate between  SARS-CoV-2 and other Sarbecovirus currently known to infect humans.  If clinically indicated additional testing with an alternate test  methodology [phone]) is advised. The SARS-CoV-2 RNA is generally  detectable in upper and lower respiratory sp ecimens during the acute  phase of infection. The expected result is Negative. Fact Sheet for Patients:  StrictlyIdeas.no Fact Sheet for Healthcare Providers: BankingDealers.co.za This test is not yet approved or cleared by the [place] FDA and has been authorized for detection and/or diagnosis of SARS-CoV-2 by FDA under an Emergency Use Authorization (EUA).  This EUA will remain in effect (meaning this test can be used) for the duration of the COVID-19 declaration under Section 564(b)(1) of the Act, 21 U.S.C. section 360bbb-3(b)(1), unless the authorization is terminated or revoked sooner. Performed at [facility], [place] [address] [place], [place] [address]       Studies: No results found.  Scheduled Meds: . amitriptyline  10 mg Oral QHS  . aspirin EC  81 mg Oral QHS  . cefdinir  600 mg Oral QHS  . cholecalciferol  2,000 Units Oral QHS  . enoxaparin (LOVENOX) injection  40 mg Subcutaneous Q24H  . feeding supplement  1 Container Oral BID BM  . ketorolac  1 drop Right Eye BID  . megestrol  400 mg Oral Daily  .  mometasone-formoterol  2 puff Inhalation BID  . multivitamin with minerals  1 tablet Oral Daily  . pantoprazole  40 mg Oral Daily  . rosuvastatin  10 mg Oral q1800  . timolol  1 drop Both Eyes BID    Continuous Infusions:    LOS: 2 days     Alma Friendly, MD [facility]  If 7PM-7AM, please contact night-coverage [email or web address] 04/12/2019, 4:42 PM

## 2019-04-12 NOTE — Progress Notes (Signed)
Physical Therapy Treatment Patient Details Name: Tina Mercado MRN: [id number] DOB: 1929/05/02 Today's Date: 04/12/2019    History of Present Illness 85 y.o. female with medical history significant of recent left femur IM nail who was in rehab for 19 days but now at home for 2 weeks, COPD, GERD, poor vision and hearing, diastolic dysfunction, hypertension and hyperlipidemia who was brought in secondary to generalized weakness and anorexia.    PT Comments    Pt assisted with ambulating in hallway and tolerated improved distance. Pt also performed a couple exercises while in recliner.  Pt reports she prefers to d/c home and aware of concern for falls (she reports her own concern since she has had falls prior to admission).  Pt states her daughter is in town until the end of June.  Pt also reports her application for [facility] came in the mail, and she would like to find out further information on that facility.  Per chart and RN, daughter would like pt to d/c to SNF.   Pt aware recommendations for 24/7 assist for safety upon d/c and considering her options.  Recommended pt discuss her wishes with her daughter. Will recommend SNF at this time since pt uncertain if she will have 24/7 assist upon d/c.  If not SNF, pt would benefit from increased home care.   Follow Up Recommendations  Supervision/Assistance - 24 hour;SNF     Equipment Recommendations  None recommended by PT    Recommendations for Other Services       Precautions / Restrictions Precautions Precautions: Fall Restrictions Other Position/Activity Restrictions: WBAT L LE per last admission    Mobility  Bed Mobility         Supine to sit: Min assist     General bed mobility comments: pt up in recliner on arrival  Transfers Overall transfer level: Needs assistance Equipment used: Rolling walker (2 wheeled) Transfers: Sit to/from Stand Sit to Stand: Min guard         General transfer comment: for  safety  Ambulation/Gait Ambulation/Gait assistance: Min guard Gait Distance (Feet): 160 Feet   Gait Pattern/deviations: Step-through pattern;Decreased stance time - left;Antalgic     General Gait Details: verbal cues for RW positioning and posture, distance as tolerated   Stairs             Wheelchair Mobility    Modified Rankin (Stroke Patients Only)       Balance Overall balance assessment: Needs assistance;History of Falls         Standing balance support: Bilateral upper extremity supported Standing balance-Leahy Scale: Poor Standing balance comment: requires UE support               High Level Balance Comments: pt had LOB while tying robe with both hands requiring steadying assist            Cognition Arousal/Alertness: Awake/alert Behavior During Therapy: WFL for tasks assessed/performed Overall Cognitive Status: Within Functional Limits for tasks assessed                                        Exercises General Exercises - Lower Extremity Long Arc Quad: AROM;10 reps;Seated;Both Heel Slides: AROM;Both;10 reps;Left;AAROM(L AAROM) Hip ABduction/ADduction: AROM;Both;10 reps Hip Flexion/Marching: AROM;10 reps;Seated;Both    General Comments        Pertinent Vitals/Pain Pain Assessment: No/denies pain    Home Living Family/patient expects to  be discharged to:: Private residence Living Arrangements: Alone Available Help at Discharge: Family;Available 24 hours/day         Home Equipment: Shower seat;Walker - 4 wheels;Cane - single point;Grab bars - tub/shower;Walker - 2 wheels      Prior Function Level of Independence: Independent with assistive device(s)      Comments: recently at SNF and discharged home with HHPT (s/p L IM nail)   PT Goals (current goals can now be found in the care plan section) Acute Rehab PT Goals Patient Stated Goal: get strength back Progress towards PT goals: Progressing toward goals     Frequency    Min 3X/week      PT Plan Current plan remains appropriate    Co-evaluation              AM-PAC PT "6 Clicks" Mobility   Outcome Measure  Help needed turning from your back to your side while in a flat bed without using bedrails?: A Little Help needed moving from lying on your back to sitting on the side of a flat bed without using bedrails?: A Little Help needed moving to and from a bed to a chair (including a wheelchair)?: A Little Help needed standing up from a chair using your arms (e.g., wheelchair or bedside chair)?: A Little Help needed to walk in hospital room?: A Little Help needed climbing 3-5 steps with a railing? : A Lot 6 Click Score: 17    End of Session Equipment Utilized During Treatment: Gait belt Activity Tolerance: Patient tolerated treatment well Patient left: with call bell/phone within reach;in chair;with chair alarm set Nurse Communication: Mobility status PT Visit Diagnosis: Muscle weakness (generalized) (M62.81)     Time: 1610-9604 PT Time Calculation (min) (ACUTE ONLY): 22 min  Charges:  $Gait Training: 8-22 mins                    Carmelia Bake, PT, DPT Acute Rehabilitation Services Office: [phone] Pager: [phone]  Trena Platt 04/12/2019, 3:29 PM

## 2019-04-12 NOTE — Plan of Care (Signed)
  Problem: Education: Goal: Knowledge of General Education information will improve Description: Including pain rating scale, medication(s)/side effects and non-pharmacologic comfort measures Outcome: Progressing   Problem: Clinical Measurements: Goal: Will remain free from infection Outcome: Progressing   Problem: Activity: Goal: Risk for activity intolerance will decrease Outcome: Progressing   Problem: Nutrition: Goal: Adequate nutrition will be maintained Outcome: Progressing   Problem: Coping: Goal: Level of anxiety will decrease Outcome: Progressing   Problem: Elimination: Goal: Will not experience complications related to bowel motility Outcome: Progressing   Problem: Pain Managment: Goal: General experience of comfort will improve Outcome: Progressing   

## 2019-04-12 NOTE — Progress Notes (Signed)
Spoke with pt's daughter,Pam, who lives in [place].  Daughter states pt will not have 24 hours supervision at home and would like to inquire about SNF or Assisted Living. MD notified and order placed for Social Work consult. Will also notify PT of daughters decision/request. Maryjo Rochester, Laurel Dimmer

## 2019-04-12 NOTE — NC FL2 (Signed)
[place] LEVEL OF CARE SCREENING TOOL     IDENTIFICATION  Patient Name: Tina Mercado Birthdate: September 15, 1929 Sex: female Admission Date (Current Location): 04/10/2019  [facility] and [place] Number:  Herbalist and Address:  [facility],  [place] [address] [place]      Provider Number: [id number]  Attending Physician Name and Address:  Alma Friendly, MD  Relative Name and Phone Number:  [phone]    Current Level of Care: SNF Recommended Level of Care: [place] Prior Approval Number:    Date Approved/Denied:   PASRR Number: [id number]  Discharge Plan: SNF    Current Diagnoses: Patient Active Problem List   Diagnosis Date Noted  . Hyponatremia 04/10/2019  . Weakness 04/10/2019  . Hypoxemia 04/10/2019  . Closed left hip fracture, initial encounter ([place]) 03/02/2019  . Prediabetes 09/28/2017  . Dizziness 09/15/2017  . Neuropathy 07/16/2017  . COPD mixed type ([place]) 04/10/2015  . Laryngopharyngeal reflux (LPR) 04/10/2015  . Chronic laryngitis 08/17/2014  . Blindness and low vision 04/13/2013  . Osteoporosis 12/23/2012  . Bronchitis 10/30/2011  . Benign hypertensive heart disease without heart failure 08/05/2011  . Pure hypercholesterolemia 08/05/2011  . Mitral regurgitation   . History of anemia   . History of diastolic dysfunction   . GERD (gastroesophageal reflux disease)   . History of seasonal allergies   . Large hiatal hernia 11/23/2004    Orientation RESPIRATION BLADDER Height & Weight     Self, Time, Situation, Place  Normal Continent Weight: 124 lb 9 oz (56.5 kg) Height:  5\' 2"  (157.5 cm)  BEHAVIORAL SYMPTOMS/MOOD NEUROLOGICAL BOWEL NUTRITION STATUS      Continent Diet(regular)  AMBULATORY STATUS COMMUNICATION OF NEEDS Skin   Limited Assist Verbally Surgical wounds(left hip repair (3 weeks ago))                       Personal Care Assistance Level of Assistance   Bathing, Feeding, Dressing Bathing Assistance: Limited assistance Feeding assistance: Independent Dressing Assistance: Limited assistance     Functional Limitations Info  Sight, Hearing, Speech Sight Info: Adequate Hearing Info: Impaired(hard of hearing) Speech Info: Adequate    SPECIAL CARE FACTORS FREQUENCY  OT (By licensed OT), PT (By licensed PT)     PT Frequency: 5x wk OT Frequency: 5x wk            Contractures Contractures Info: Not present    Additional Factors Info  Code Status, Allergies Code Status Info: full code Allergies Info: Contrast Media Iodinated Diagnostic Agents,Antihistamines, Diphenhydramine-type Celebrex Celecoxib Darvocet Propoxyphene N-acetaminophen Demerol Diphenhydramine Hcl Feldene Piroxicam Gabapentin Meperidine Propoxyphene Iodine-131           Current Medications (04/12/2019):  This is the current hospital active medication list Current Facility-Administered Medications  Medication Dose Route Frequency Provider Last Rate Last Dose  . acetaminophen (TYLENOL) tablet 650 mg  650 mg Oral Q6H PRN Gala Romney L, MD      . amitriptyline (ELAVIL) tablet 10 mg  10 mg Oral QHS Elwyn Reach, MD   10 mg at 04/11/19 2159  . aspirin EC tablet 81 mg  81 mg Oral QHS Elwyn Reach, MD   81 mg at 04/11/19 2159  . cefdinir (OMNICEF) capsule 600 mg  600 mg Oral QHS Alma Friendly, MD      . cholecalciferol (VITAMIN D3) tablet 2,000 Units  2,000 Units Oral QHS Elwyn Reach, MD   2,000  Units at 04/11/19 2159  . diclofenac (VOLTAREN) EC tablet 75 mg  75 mg Oral Daily PRN Gala Romney L, MD      . docusate sodium (COLACE) capsule 100 mg  100 mg Oral Daily PRN Gala Romney L, MD      . enoxaparin (LOVENOX) injection 40 mg  40 mg Subcutaneous Q24H Alma Friendly, MD   40 mg at 04/11/19 2202  . feeding supplement (BOOST / RESOURCE BREEZE) liquid 1 Container  1 Container Oral BID BM Alma Friendly, MD   1 Container at 04/12/19  0936  . hydrochlorothiazide (HYDRODIURIL) tablet 12.5-25 mg  12.5-25 mg Oral Daily PRN Elwyn Reach, MD      . ibuprofen (ADVIL) tablet 200 mg  200 mg Oral Q6H PRN Garba, Mohammad L, MD      . ipratropium-albuterol (DUONEB) 0.5-2.5 (3) MG/3ML nebulizer solution 3 mL  3 mL Nebulization Q2H PRN Alma Friendly, MD      . ketorolac (ACULAR) 0.5 % ophthalmic solution 1 drop  1 drop Right Eye BID Elwyn Reach, MD   1 drop at 04/12/19 0939  . meclizine (ANTIVERT) tablet 25 mg  25 mg Oral TID PRN Elwyn Reach, MD      . megestrol (MEGACE) 400 MG/10ML suspension 400 mg  400 mg Oral Daily Garba, Mohammad L, MD      . mometasone-formoterol (DULERA) 200-5 MCG/ACT inhaler 2 puff  2 puff Inhalation BID Elwyn Reach, MD   2 puff at 04/12/19 0808  . multivitamin with minerals tablet 1 tablet  1 tablet Oral Daily Elwyn Reach, MD   1 tablet at 04/12/19 0936  . ondansetron (ZOFRAN) tablet 4 mg  4 mg Oral Q6H PRN Elwyn Reach, MD       Or  . ondansetron (ZOFRAN) injection 4 mg  4 mg Intravenous Q6H PRN Elwyn Reach, MD   4 mg at 04/11/19 1324  . pantoprazole (PROTONIX) EC tablet 40 mg  40 mg Oral Daily Elwyn Reach, MD   40 mg at 04/12/19 0936  . rosuvastatin (CRESTOR) tablet 10 mg  10 mg Oral q1800 Elwyn Reach, MD   10 mg at 04/11/19 1828  . timolol (TIMOPTIC) 0.5 % ophthalmic solution 1 drop  1 drop Both Eyes BID Elwyn Reach, MD   1 drop at 04/12/19 8768     Discharge Medications: Please see discharge summary for a list of discharge medications.  Relevant Imaging Results:  Relevant Lab Results:   Additional Information [id number] [place], [place]

## 2019-04-12 NOTE — TOC Initial Note (Signed)
Transition of Care [facility]) - Initial/Assessment Note    Patient Details  Name: Tina Mercado MRN: [id number] Date of Birth: [birth year]  Transition of [facility]) CM/SW Contact:    Wende Neighbors, LCSW Phone Number: 04/12/2019, 5:08 PM  Clinical Narrative: Clinical Social Worker following patient for support and discharge needs. CSW spoke with patients daughter Tina Mercado via phone since RN stated that family need to speak to CSW about discharge plan.  Tina Mercado stated that she would like patient to go to rehab instead of going home with home health. Tina Mercado is aware that patient does not want to go to rehab but stated that she will only be in town until the end of June and no one will be home with patient once she leaves to go back to [place]. CSW gave Tina Mercado information on [facility] program stating that patient will qualify for the program. Tina Mercado stated she does not feel like that program would benefit patient because she will be left alone after a couple of weeks. CSW asked Tina Mercado what she would like the discharge plan to be. Tina Mercado stated she would like patient placed into a nursing home long term. CSW explained that she would not be able to place patient into a long term care facility and that she would have to do the paperwork herself. CSW explained that she understands her worries about patient but if patient does not want to go to rehab the hospital will be unable to force patient. CSW requested that Tina Mercado please allow representative Tina Mercado from [place] to give her more information on Home First since patient does not want to go to rehab. Tina Mercado asked if she will be able to make decision for patients since she is patients POA. Patient is cognitive and is able to make her own decisions.   CSW spoke with patient at bedside to ask about discharge plans. Patient stated she does not want to go to rehab because when she was at [facility] she was confined to her room. CSW explained that due to Covid she had to be quarantined at the  facility. Patient stated she does not want to have to be at a facility again and be restricted to her room. Patient stated she is aware that her daughter wants her to go to rehab but stated she would prefer to be home. Patient stated she will talk to her daughter again via phone. Patient stated she understands that she will need to go to a facility long term and she has started looking into it.              Expected Discharge Plan: [place] Barriers to Discharge: Continued Medical Work up   Patient Goals and CMS Choice Patient states their goals for this hospitalization and ongoing recovery are:: to go home and get therapy   Choice offered to / list presented to : Patient(daughter Tina Mercado)  Expected Discharge Plan and Services Expected Discharge Plan: [place] In-house Referral: Clinical Social Work   Post Acute Care Choice: [place], [place] Living arrangements for the past 2 months: Single Family Home                                      Prior Living Arrangements/Services Living arrangements for the past 2 months: Single Family Home Lives with:: Self Patient language and need for interpreter reviewed:: Yes  Need for Family Participation in Patient Care: Yes (Comment) Care giver support system in place?: Yes (comment)   Criminal Activity/Legal Involvement Pertinent to Current Situation/Hospitalization: No - Comment as needed  Activities of Daily Living Home Assistive Devices/Equipment: Walker (specify type) ADL Screening (condition at time of admission) Patient's cognitive ability adequate to safely complete daily activities?: Yes Is the patient deaf or have difficulty hearing?: Yes Does the patient have difficulty seeing, even when wearing glasses/contacts?: No Does the patient have difficulty concentrating, remembering, or making decisions?: No Patient able to express need for assistance with ADLs?: Yes Does the  patient have difficulty dressing or bathing?: No Independently performs ADLs?: Yes (appropriate for developmental age) Does the patient have difficulty walking or climbing stairs?: Yes Weakness of Legs: None Weakness of Arms/Hands: None  Permission Sought/Granted Permission sought to share information with : Family Supports    Share Information with NAME: Tina Mercado     Permission granted to share info w Relationship: daughter  Permission granted to share info w Contact Information: [phone]  Emotional Assessment Appearance:: Appears stated age Attitude/Demeanor/Rapport: Engaged Affect (typically observed): Accepting Orientation: : Oriented to Self, Oriented to Place, Oriented to  Time, Oriented to Situation Alcohol / Substance Use: Not Applicable Psych Involvement: No (comment)  Admission diagnosis:  Hypoxia [R09.02] Generalized weakness [R53.1] Patient Active Problem List   Diagnosis Date Noted  . Hyponatremia 04/10/2019  . Weakness 04/10/2019  . Hypoxemia 04/10/2019  . Closed left hip fracture, initial encounter ([place]) 03/02/2019  . Prediabetes 09/28/2017  . Dizziness 09/15/2017  . Neuropathy 07/16/2017  . COPD mixed type ([place]) 04/10/2015  . Laryngopharyngeal reflux (LPR) 04/10/2015  . Chronic laryngitis 08/17/2014  . Blindness and low vision 04/13/2013  . Osteoporosis 12/23/2012  . Bronchitis 10/30/2011  . Benign hypertensive heart disease without heart failure 08/05/2011  . Pure hypercholesterolemia 08/05/2011  . Mitral regurgitation   . History of anemia   . History of diastolic dysfunction   . GERD (gastroesophageal reflux disease)   . History of seasonal allergies   . Large hiatal hernia 11/23/2004   PCP:  Shawnee Knapp, MD Pharmacy:   [id number] [address], [place] [address] Phone: [phone] Fax: [phone]     Social Determinants of Health (SDOH) Interventions    Readmission Risk  Interventions Readmission Risk Prevention Plan 03/04/2019  Post Dischage Appt Not Complete  Medication Screening Complete  Transportation Screening Complete  Some recent data might be hidden

## 2019-04-13 DIAGNOSIS — Z862 Personal history of diseases of the blood and blood-forming organs and certain disorders involving the immune mechanism: Secondary | ICD-10-CM

## 2019-04-13 LAB — CBC WITH DIFFERENTIAL/PLATELET
Abs Immature Granulocytes: 0.1 10*3/uL — ABNORMAL HIGH (ref 0.00–0.07)
Basophils Absolute: 0 10*3/uL (ref 0.0–0.1)
Basophils Relative: 1 %
Eosinophils Absolute: 0.3 10*3/uL (ref 0.0–0.5)
Eosinophils Relative: 6 %
HCT: 34.6 % — ABNORMAL LOW (ref 36.0–46.0)
Hemoglobin: 11 g/dL — ABNORMAL LOW (ref 12.0–15.0)
Immature Granulocytes: 2 %
Lymphocytes Relative: 26 %
Lymphs Abs: 1.3 10*3/uL (ref 0.7–4.0)
MCH: 29.8 pg (ref 26.0–34.0)
MCHC: 31.8 g/dL (ref 30.0–36.0)
MCV: 93.8 fL (ref 80.0–100.0)
Monocytes Absolute: 0.5 10*3/uL (ref 0.1–1.0)
Monocytes Relative: 10 %
Neutro Abs: 2.8 10*3/uL (ref 1.7–7.7)
Neutrophils Relative %: 55 %
Platelets: 241 10*3/uL (ref 150–400)
RBC: 3.69 MIL/uL — ABNORMAL LOW (ref 3.87–5.11)
RDW: 16.2 % — ABNORMAL HIGH (ref 11.5–15.5)
WBC: 5 10*3/uL (ref 4.0–10.5)
nRBC: 0 % (ref 0.0–0.2)

## 2019-04-13 LAB — BASIC METABOLIC PANEL
Anion gap: 9 (ref 5–15)
BUN: 11 mg/dL (ref 8–23)
CO2: 17 mmol/L — ABNORMAL LOW (ref 22–32)
Calcium: 8.4 mg/dL — ABNORMAL LOW (ref 8.9–10.3)
Chloride: 109 mmol/L (ref 98–111)
Creatinine, Ser: 0.69 mg/dL (ref 0.44–1.00)
GFR calc Af Amer: >60 mL/min (ref >60)
GFR calc non Af Amer: >60 mL/min (ref >60)
Glucose, Bld: 101 mg/dL — ABNORMAL HIGH (ref 70–99)
Potassium: 3.9 mmol/L (ref 3.5–5.1)
Sodium: 135 mmol/L (ref 135–145)

## 2019-04-13 MED ORDER — BISACODYL 10 MG RE SUPP
10.0000 mg | Freq: Every day | RECTAL | Status: DC | PRN
  Administered 2019-04-13: 10 mg via RECTAL
  Filled 2019-04-13 (×2): qty 1

## 2019-04-13 MED ORDER — POLYETHYLENE GLYCOL 3350 17 G PO PACK
17.0000 g | PACK | Freq: Two times a day (BID) | ORAL | Status: DC
  Administered 2019-04-13: 17 g via ORAL
  Filled 2019-04-13: qty 1

## 2019-04-13 MED ORDER — SENNOSIDES-DOCUSATE SODIUM 8.6-50 MG PO TABS
1.0000 | ORAL_TABLET | Freq: Two times a day (BID) | ORAL | Status: DC
  Administered 2019-04-13 – 2019-04-14 (×2): 1 via ORAL
  Filled 2019-04-13 (×2): qty 1

## 2019-04-13 NOTE — Progress Notes (Signed)
PROGRESS NOTE    [name]  JHE:[id number] DOB: 1929-07-23 DOA: 04/10/2019 PCP: Shawnee Knapp, MD  Brief Narrative:  HPI per Dr. Jonelle Sidle on 04/10/2019 MAKAIAH Mercado is a 85 y.o. female with medical history significant of recent left total hip replacement who was in rehab for 19 days but now at home for 2 weeks, COPD, GERD, poor vision and hearing, diastolic dysfunction, hypertension and hyperlipidemia who was brought in secondary to generalized weakness and anorexia.  Patient reported anorexia even before she left rehab but generally she is feeling strong.  3 days after returning home she continues to get weak and debilitated.  She denied any fever or chills.  Denied any sick contacts.  Patient has progressively gotten weak to where she is unable to do anything.  Today she got up went to the bathroom to brush her teeth and could not even do it.  She has lost appetite completely.  She also has had abnormal taste of any food so she stopped eating.  In the ED she is fully awake and alert and communicating just global weakness.  Patient was being evaluated and as she stood up to walk her oxygen sats dropped into the 80s and so she is being admitted to the hospital for work-up of these hypoxia  ED Course: Temperature is 98.8 blood pressure 167/66 pulse 110 respiratory 23 and oxygen sat 83% on room air.  CBC and chemistry are largely within normal.  D-dimer of 2.06 and urinalysis negative.  COVID-19 testing also negative.  Chest x-ray showed increased retrocardiac opacity probably gastric contents with superimposed airspace disease difficulty exclude.  Suspected small left pleural effusion which is new and chronic hyper inflammation.  Patient could not get CT angiogram due to severe IVP dye allergy.  She is being admitted with questionable PE versus pneumonia.  **Interim History   Assessment & Plan:   Principal Problem:   Hypoxemia Active Problems:   History of anemia   GERD (gastroesophageal  reflux disease)   COPD mixed type (HCC)   Hyponatremia   Weakness  Acute hypoxemic respiratory failure  -Likely 2/2 ?HCAP, PE ruled out -Afebrile, no leukocytosis -Urine strep pneumo negative -Chest x-ray showed increased retrocardiac opacity which may be in part due to gastric contents related to known large hiatal hernia, however superimposed airspace disease difficult to exclude in this setting -VQ scan showed: Very low probability for acute pulmonary embolism.  -Switched Levaquin--> Cefdinir and will continue for 7 day cours -C/w DuoNeb, inhaler -Repeat CXR in AM   Generalized weakness/deconditioning/anorexia -Multifactorial: Recent surgery, poor oral intake -Dietitian consulted -PT/OT recommending SNF  -Continue Megace  Hyponatremia -Resolved -Likely due to poor oral intake -S/P IV fluids  Recent left hip replacement -Continue PT/OT  COPD -Continue DuoNeb, inhalers as above  GERD -Continue PPI with Pantoprazole 40 mg daily  Hyperlipidemia -Continue with Rosuvastatin 10 mg p.o. daily  Hypertension -Continue with hydrochlorothiazide 12.5 to 25 mg daily PRN for elevated blood pressure  Constipation -Start Bowel Regimen with Senna-Docusate 1 tab po BID, Miralax 17 grams BID  -Give Bisacodyl Suppository  DVT prophylaxis: Enoxaparin 40 mg sq q24h Code Status: FULL CODE  Family Communication: No family present at bedside  Disposition Plan: SNF when bed available   Consultants:   None   Procedures: None    Antimicrobials: Anti-infectives (From admission, onward)   Start     Dose/Rate Route Frequency Ordered Stop   04/12/19 2200  cefdinir (OMNICEF) capsule 600 mg  600 mg Oral Daily at bedtime 04/11/19 1501 04/18/19 2159   04/11/19 0030  levofloxacin (LEVAQUIN) IVPB 750 mg  Status:  Discontinued     750 mg 100 mL/hr over 90 Minutes Intravenous Every 48 hours 04/11/19 0016 04/11/19 1501     Subjective: Seen and examined at bedside she was doing  okay.  Denies any chest pain, lightheadedness or dizziness but states that she had a "tingle in her throat".  No nausea or vomiting.  No other concerns or complaints at this time  Objective: Vitals:   04/12/19 2154 04/13/19 0543 04/13/19 0757 04/13/19 1250  BP:  (!) 142/64  129/65  Pulse:  72  76  Resp:  12  17  Temp:  98.2 F (36.8 C)  98.3 F (36.8 C)  TempSrc:  Oral  Oral  SpO2: 98% 96% 97% 97%  Weight:      Height:        Intake/Output Summary (Last 24 hours) at 04/13/2019 1919 Last data filed at 04/13/2019 1833 Gross per 24 hour  Intake 240 ml  Output 900 ml  Net -660 ml   Filed Weights   04/11/19 0000 04/11/19 0121  Weight: 56.7 kg 56.5 kg   Examination: Physical Exam:  Constitutional: Thin Caucasian elderly female in NAD and appears calm and comfortable Eyes: Lids and conjunctivae normal, sclerae anicteric  ENMT: External Ears, Nose appear normal. Grossly normal hearing.  Neck: Appears normal, supple, no cervical masses, normal ROM, no appreciable thyromegaly; no JVD Respiratory: Diminishedto auscultation bilaterally with mild coarse breath sounds, no wheezing, rales, rhonchi or crackles. Normal respiratory effort and patient is not tachypenic. No accessory muscle use. Unlabored breathing  Cardiovascular: RRR, no murmurs / rubs / gallops. S1 and S2 auscultated. Trace extremity edema.   Abdomen: Soft, non-tender, ND. No masses palpated. No appreciable hepatosplenomegaly. Bowel sounds positive x4.  GU: Deferred. Musculoskeletal: No clubbing / cyanosis of digits/nails. No joint deformity upper and lower extremities.  Skin: No rashes, lesions, ulcers on a limited skin evaluation. No induration; Warm and dry.  Neurologic: CN 2-12 grossly intact with no focal deficits.  Romberg sign and cerebellar reflexes not assessed.  Psychiatric: Normal judgment and insight. Alert and oriented x 3. Normal mood and appropriate affect.   Data Reviewed: I have personally reviewed following  labs and imaging studies  CBC: Recent Labs  Lab 04/10/19 1622 04/11/19 0426 04/12/19 0429 04/13/19 0435  WBC 6.9 5.8 5.1 5.0  NEUTROABS 5.2  --  3.3 2.8  HGB 13.4 12.3 11.9* 11.0*  HCT 41.3 39.5 37.9 34.6*  MCV 91.2 93.6 93.8 93.8  PLT 220 207 230 161   Basic Metabolic Panel: Recent Labs  Lab 04/10/19 1622 04/11/19 0426 04/12/19 0429 04/13/19 0435  NA 130* 134* 136 135  K 5.0 4.5 3.8 3.9  CL 103 106 112* 109  CO2 18* 18* 18* 17*  GLUCOSE 96 79 113* 101*  BUN 20 14 10 11   CREATININE 1.15* 0.90 0.87 0.69  CALCIUM 8.9 8.7* 8.6* 8.4*  MG 2.0  --   --   --    GFR: Estimated Creatinine Clearance: 38.4 mL/min (by C-G formula based on SCr of 0.69 mg/dL). Liver Function Tests: Recent Labs  Lab 04/10/19 1622 04/11/19 0426  AST 27 19  ALT 17 17  ALKPHOS 104 89  BILITOT 0.4 0.5  PROT 6.7 5.9*  ALBUMIN 3.6 3.0*   Recent Labs  Lab 04/10/19 1622  LIPASE 38   No results for input(s): AMMONIA in the  last 168 hours. Coagulation Profile: No results for input(s): INR, PROTIME in the last 168 hours. Cardiac Enzymes: No results for input(s): CKTOTAL, CKMB, CKMBINDEX, TROPONINI in the last 168 hours. BNP (last 3 results) No results for input(s): PROBNP in the last 8760 hours. HbA1C: No results for input(s): HGBA1C in the last 72 hours. CBG: No results for input(s): GLUCAP in the last 168 hours. Lipid Profile: No results for input(s): CHOL, HDL, LDLCALC, TRIG, CHOLHDL, LDLDIRECT in the last 72 hours. Thyroid Function Tests: No results for input(s): TSH, T4TOTAL, FREET4, T3FREE, THYROIDAB in the last 72 hours. Anemia Panel: No results for input(s): VITAMINB12, FOLATE, FERRITIN, TIBC, IRON, RETICCTPCT in the last 72 hours. Sepsis Labs: No results for input(s): PROCALCITON, LATICACIDVEN in the last 168 hours.  Recent Results (from the past 240 hour(s))  SARS Coronavirus 2 (CEPHEID- Performed in [place] hospital lab), Hosp Order     Status: None   Collection Time:  04/10/19  9:05 PM   Specimen: Nasopharyngeal Swab  Result Value Ref Range Status   SARS Coronavirus 2 NEGATIVE NEGATIVE Final    Comment: (NOTE) If result is NEGATIVE SARS-CoV-2 target nucleic acids are NOT DETECTED. The SARS-CoV-2 RNA is generally detectable in upper and lower  respiratory specimens during the acute phase of infection. The lowest  concentration of SARS-CoV-2 viral copies this assay can detect is 250  copies / mL. A negative result does not preclude SARS-CoV-2 infection  and should not be used as the sole basis for treatment or other  patient management decisions.  A negative result may occur with  improper specimen collection / handling, submission of specimen other  than nasopharyngeal swab, presence of viral mutation(s) within the  areas targeted by this assay, and inadequate number of viral copies  (<250 copies / mL). A negative result must be combined with clinical  observations, patient history, and epidemiological information. If result is POSITIVE SARS-CoV-2 target nucleic acids are DETECTED. The SARS-CoV-2 RNA is generally detectable in upper and lower  respiratory specimens dur ing the acute phase of infection.  Positive  results are indicative of active infection with SARS-CoV-2.  Clinical  correlation with patient history and other diagnostic information is  necessary to determine patient infection status.  Positive results do  not rule out bacterial infection or co-infection with other viruses. If result is PRESUMPTIVE POSTIVE SARS-CoV-2 nucleic acids MAY BE PRESENT.   A presumptive positive result was obtained on the submitted specimen  and confirmed on repeat testing.  While 2019 novel coronavirus  (SARS-CoV-2) nucleic acids may be present in the submitted sample  additional confirmatory testing may be necessary for epidemiological  and / or clinical management purposes  to differentiate between  SARS-CoV-2 and other Sarbecovirus currently known to  infect humans.  If clinically indicated additional testing with an alternate test  methodology [phone]) is advised. The SARS-CoV-2 RNA is generally  detectable in upper and lower respiratory sp ecimens during the acute  phase of infection. The expected result is Negative. Fact Sheet for Patients:  StrictlyIdeas.no Fact Sheet for Healthcare Providers: BankingDealers.co.za This test is not yet approved or cleared by the [place] FDA and has been authorized for detection and/or diagnosis of SARS-CoV-2 by FDA under an Emergency Use Authorization (EUA).  This EUA will remain in effect (meaning this test can be used) for the duration of the COVID-19 declaration under Section 564(b)(1) of the Act, 21 U.S.C. section 360bbb-3(b)(1), unless the authorization is terminated or revoked sooner. Performed at [facility]  [place] [address], [place], [place] [address]      Radiology Studies: No results found.  Scheduled Meds: . amitriptyline  10 mg Oral QHS  . aspirin EC  81 mg Oral QHS  . cefdinir  600 mg Oral QHS  . cholecalciferol  2,000 Units Oral QHS  . enoxaparin (LOVENOX) injection  40 mg Subcutaneous Q24H  . feeding supplement  1 Container Oral BID BM  . ketorolac  1 drop Right Eye BID  . megestrol  400 mg Oral Daily  . mometasone-formoterol  2 puff Inhalation BID  . multivitamin with minerals  1 tablet Oral Daily  . pantoprazole  40 mg Oral Daily  . polyethylene glycol  17 g Oral BID  . rosuvastatin  10 mg Oral q1800  . senna-docusate  1 tablet Oral BID  . timolol  1 drop Both Eyes BID   Continuous Infusions:   LOS: 3 days   Kerney Elbe, DO [facility] PAGER is on AMION  If 7PM-7AM, please contact night-coverage [email or web address] Password [facility] 04/13/2019, 7:19 PM

## 2019-04-13 NOTE — Progress Notes (Signed)
Occupational Therapy Treatment Patient Details Name: Tina Mercado MRN: [id number] DOB: 1928/12/15 Today's Date: 04/13/2019    History of present illness 85 y.o. female with medical history significant of recent left femur IM nail who was in rehab for 19 days but now at home for 2 weeks, COPD, GERD, poor vision and hearing, diastolic dysfunction, hypertension and hyperlipidemia who was brought in secondary to generalized weakness and anorexia.   OT comments  Pt is making good progress with OT.  Pt prefers home and daughter prefers her to go to a SNF.     Follow Up Recommendations  SNF(vs home first program/HHOT/ inital 24/7)    Equipment Recommendations  None recommended by OT    Recommendations for Other Services      Precautions / Restrictions Precautions Precautions: Fall Restrictions Other Position/Activity Restrictions: WBAT L LE per last admission       Mobility Bed Mobility               General bed mobility comments: oob  Transfers   Equipment used: Rolling walker (2 wheeled)   Sit to Stand: Supervision              Balance                                           ADL either performed or assessed with clinical judgement   ADL       Grooming: Oral care;Wash/dry hands;Standing;Supervision/safety   Upper Body Bathing: Set up   Lower Body Bathing: Supervison/ safety;Sit to/from stand   Upper Body Dressing : Set up   Lower Body Dressing: Minimal assistance;Sitting/lateral leans   Toilet Transfer: Supervision/safety;Min guard;Ambulation;Comfort height toilet;BSC;RW   Toileting- Clothing Manipulation and Hygiene: Supervision/safety;Sit to/from stand         General ADL Comments: ambulated to bathroom and performed ADL.  Needed a little bit of assistance donning underwear as toilet was high. She used sock aide to don socks.  No LOB during ADL/ambulating today     Vision       Perception     Praxis       Cognition Arousal/Alertness: Awake/alert Behavior During Therapy: WFL for tasks assessed/performed Overall Cognitive Status: Within Functional Limits for tasks assessed                                          Exercises     Shoulder Instructions       General Comments      Pertinent Vitals/ Pain       Pain Assessment: No/denies pain  Home Living                                          Prior Functioning/Environment              Frequency  Min 2X/week        Progress Toward Goals  OT Goals(current goals can now be found in the care plan section)  Progress towards OT goals: Progressing toward goals     Plan      Co-evaluation                 AM-PAC  OT "6 Clicks" Daily Activity     Outcome Measure   Help from another person eating meals?: None Help from another person taking care of personal grooming?: A Little Help from another person toileting, which includes using toliet, bedpan, or urinal?: A Little Help from another person bathing (including washing, rinsing, drying)?: A Little Help from another person to put on and taking off regular upper body clothing?: A Little Help from another person to put on and taking off regular lower body clothing?: A Little 6 Click Score: 19    End of Session    OT Visit Diagnosis: Muscle weakness (generalized) (M62.81);Unsteadiness on feet (R26.81)   Activity Tolerance Patient tolerated treatment well   Patient Left in chair;with call bell/phone within reach;with chair alarm set   Nurse Communication          Time: 0539-7673 OT Time Calculation (min): 27 min  Charges: OT General Charges $OT Visit: 1 Visit OT Treatments $Self Care/Home Management : 23-37 mins  Lesle Chris, OTR/L Acute Rehabilitation Services [phone] [place] pager [phone] office 04/13/2019   [place] 04/13/2019, 12:23 PM

## 2019-04-13 NOTE — Progress Notes (Signed)
Physical Therapy Treatment Patient Details Name: Tina Mercado MRN: [id number] DOB: 25-Dec-1928 Today's Date: 04/13/2019    History of Present Illness 85 y.o. female with medical history significant of recent left femur IM nail who was in rehab for 19 days but now at home for 2 weeks, COPD, GERD, poor vision and hearing, diastolic dysfunction, hypertension and hyperlipidemia who was brought in secondary to generalized weakness and anorexia.    PT Comments    Pt is making steady progress with mobility. Pt is limited by fatigue and general weakness. Pt would like to d/c home with HHPT services. Pt reports her daughter is here visiting and is requesting she d/c to a SNF facility for rehab. Pt is a fall risk if left unsupervised. She did have 1 LOB in therapy requiring steady assist. Do feel if patient had supervision she would be able to d/c home with HHPT services otherwise agree with previous plan of SNF. Case management reported she may qualify for a program of home care services that provides 24 hour supervision with a gradual transition to less care over time. Do feel this is an appropriate plan with a long term plan of transitioning to an ALF. Will continue to provide acute skilled PT until d/c.   Follow Up Recommendations  Supervision/Assistance - 24 hour     Equipment Recommendations  None recommended by PT    Recommendations for Other Services       Precautions / Restrictions Precautions Precautions: Fall Restrictions Weight Bearing Restrictions: No Other Position/Activity Restrictions: WBAT L LE per last admission    Mobility  Bed Mobility Overal bed mobility: Needs Assistance Bed Mobility: Supine to Sit     Supine to sit: Min guard Sit to supine: Min guard   General bed mobility comments: cues for hand placement and use of bed rails  Transfers Overall transfer level: Needs assistance Equipment used: Rolling walker (2 wheeled) Transfers: Sit to/from [employer] Sit to Stand: Min guard Stand pivot transfers: Min guard       General transfer comment: cues for hand placement  Ambulation/Gait Ambulation/Gait assistance: Min guard Gait Distance (Feet): 200 Feet Assistive device: Rolling walker (2 wheeled) Gait Pattern/deviations: Step-through pattern;Decreased stride length Gait velocity: decreased       Stairs             Wheelchair Mobility    Modified Rankin (Stroke Patients Only)       Balance Overall balance assessment: Needs assistance Sitting-balance support: No upper extremity supported Sitting balance-Leahy Scale: Good     Standing balance support: Bilateral upper extremity supported Standing balance-Leahy Scale: Fair                 High Level Balance Comments: pt did have 1 LOB while returning to room and required steady assist to regain balance.            Cognition Arousal/Alertness: Awake/alert Behavior During Therapy: WFL for tasks assessed/performed Overall Cognitive Status: Within Functional Limits for tasks assessed                                        Exercises General Exercises - Lower Extremity Ankle Circles/Pumps: AROM;Both;15 reps;Seated;Strengthening Quad Sets: AROM;Strengthening;Both;15 reps;Seated Long Arc Quad: AROM;Strengthening;Both;15 reps;Seated Heel Slides: AROM;Both;15 reps;Seated Hip ABduction/ADduction: AROM;Strengthening;Both;15 reps;Seated    General Comments        Pertinent Vitals/Pain Pain Assessment: No/denies pain  Home Living                      Prior Function            PT Goals (current goals can now be found in the care plan section) Progress towards PT goals: Progressing toward goals    Frequency    Min 3X/week      PT Plan Current plan remains appropriate    Co-evaluation              AM-PAC PT "6 Clicks" Mobility   Outcome Measure  Help needed turning from your back to your side  while in a flat bed without using bedrails?: A Little Help needed moving from lying on your back to sitting on the side of a flat bed without using bedrails?: A Little Help needed moving to and from a bed to a chair (including a wheelchair)?: A Little Help needed standing up from a chair using your arms (e.g., wheelchair or bedside chair)?: A Little Help needed to walk in hospital room?: A Little Help needed climbing 3-5 steps with a railing? : A Lot 6 Click Score: 17    End of Session Equipment Utilized During Treatment: Gait belt Activity Tolerance: Patient tolerated treatment well;Patient limited by fatigue Patient left: in bed;with call bell/phone within reach Nurse Communication: Mobility status PT Visit Diagnosis: Muscle weakness (generalized) (M62.81);Difficulty in walking, not elsewhere classified (R26.2)     Time: 0223-3612 PT Time Calculation (min) (ACUTE ONLY): 38 min  Charges:  $Gait Training: 8-22 mins $Therapeutic Exercise: 8-22 mins $Self Care/Home Management: [place], PT   Lelon Mast 04/13/2019, 2:22 PM

## 2019-04-13 NOTE — Care Management Important Message (Signed)
Important Message  Patient Details IM Letter given to Rhea Pink SW to present to the Patient Name: Tina Mercado MRN: [id number] Date of Birth: September 18, 1929   Medicare Important Message Given:  Yes    Kerin Salen 04/13/2019, 10:29 AM

## 2019-04-13 NOTE — TOC Progression Note (Signed)
Transition of Care [facility]) - Progression Note    Patient Details  Name: Tina Mercado MRN: [id number] Date of Birth: 1929/05/22  Transition of Care [facility]) CM/SW [place], LCSW Phone Number: 04/13/2019, 3:55 PM  Clinical Narrative:   Patient and daughter are in agreement that patient will go to rehab to gain strength and once patient is discharge she will transition to [facility]. CSW spoke with patient and daughter (speaker phone) and they were both agreeable for patient to discharge to [facility] for rehab. Daughter [name] stated that she will go to [facility] tomorrow to hold patients spot in there independent living facility so patient will have it once discharge from rehab.      Expected Discharge Plan: [place] Barriers to Discharge: Continued Medical Work up  Expected Discharge Plan and Services Expected Discharge Plan: [place] In-house Referral: Clinical Social Work   Post Acute Care Choice: [place], [place] Living arrangements for the past 2 months: Single Family Home                                       Social Determinants of Health (SDOH) Interventions    Readmission Risk Interventions Readmission Risk Prevention Plan 03/04/2019  Post Dischage Appt Not Complete  Medication Screening Complete  Transportation Screening Complete  Some recent data might be hidden

## 2019-04-14 ENCOUNTER — Inpatient Hospital Stay (HOSPITAL_COMMUNITY): Payer: Medicare Other

## 2019-04-14 DIAGNOSIS — R630 Anorexia: Secondary | ICD-10-CM | POA: Diagnosis not present

## 2019-04-14 DIAGNOSIS — S72002A Fracture of unspecified part of neck of left femur, initial encounter for closed fracture: Secondary | ICD-10-CM | POA: Diagnosis not present

## 2019-04-14 DIAGNOSIS — E785 Hyperlipidemia, unspecified: Secondary | ICD-10-CM | POA: Diagnosis not present

## 2019-04-14 DIAGNOSIS — M6281 Muscle weakness (generalized): Secondary | ICD-10-CM | POA: Diagnosis not present

## 2019-04-14 DIAGNOSIS — E871 Hypo-osmolality and hyponatremia: Secondary | ICD-10-CM | POA: Diagnosis not present

## 2019-04-14 DIAGNOSIS — J189 Pneumonia, unspecified organism: Secondary | ICD-10-CM | POA: Diagnosis not present

## 2019-04-14 DIAGNOSIS — R011 Cardiac murmur, unspecified: Secondary | ICD-10-CM | POA: Diagnosis not present

## 2019-04-14 DIAGNOSIS — R627 Adult failure to thrive: Secondary | ICD-10-CM | POA: Diagnosis not present

## 2019-04-14 DIAGNOSIS — I10 Essential (primary) hypertension: Secondary | ICD-10-CM | POA: Diagnosis not present

## 2019-04-14 DIAGNOSIS — R0902 Hypoxemia: Secondary | ICD-10-CM | POA: Diagnosis not present

## 2019-04-14 DIAGNOSIS — H409 Unspecified glaucoma: Secondary | ICD-10-CM | POA: Diagnosis not present

## 2019-04-14 DIAGNOSIS — Z7401 Bed confinement status: Secondary | ICD-10-CM | POA: Diagnosis not present

## 2019-04-14 DIAGNOSIS — R531 Weakness: Secondary | ICD-10-CM | POA: Diagnosis not present

## 2019-04-14 DIAGNOSIS — H9190 Unspecified hearing loss, unspecified ear: Secondary | ICD-10-CM | POA: Diagnosis not present

## 2019-04-14 DIAGNOSIS — J9601 Acute respiratory failure with hypoxia: Secondary | ICD-10-CM | POA: Diagnosis not present

## 2019-04-14 DIAGNOSIS — I499 Cardiac arrhythmia, unspecified: Secondary | ICD-10-CM | POA: Diagnosis not present

## 2019-04-14 DIAGNOSIS — H269 Unspecified cataract: Secondary | ICD-10-CM | POA: Diagnosis not present

## 2019-04-14 DIAGNOSIS — D649 Anemia, unspecified: Secondary | ICD-10-CM | POA: Diagnosis not present

## 2019-04-14 DIAGNOSIS — K219 Gastro-esophageal reflux disease without esophagitis: Secondary | ICD-10-CM | POA: Diagnosis not present

## 2019-04-14 DIAGNOSIS — R41841 Cognitive communication deficit: Secondary | ICD-10-CM | POA: Diagnosis not present

## 2019-04-14 DIAGNOSIS — M2550 Pain in unspecified joint: Secondary | ICD-10-CM | POA: Diagnosis not present

## 2019-04-14 DIAGNOSIS — U071 COVID-19: Secondary | ICD-10-CM | POA: Diagnosis not present

## 2019-04-14 DIAGNOSIS — M810 Age-related osteoporosis without current pathological fracture: Secondary | ICD-10-CM | POA: Diagnosis not present

## 2019-04-14 DIAGNOSIS — Z96642 Presence of left artificial hip joint: Secondary | ICD-10-CM | POA: Diagnosis not present

## 2019-04-14 DIAGNOSIS — R0602 Shortness of breath: Secondary | ICD-10-CM | POA: Diagnosis not present

## 2019-04-14 DIAGNOSIS — K449 Diaphragmatic hernia without obstruction or gangrene: Secondary | ICD-10-CM | POA: Diagnosis not present

## 2019-04-14 DIAGNOSIS — J449 Chronic obstructive pulmonary disease, unspecified: Secondary | ICD-10-CM | POA: Diagnosis not present

## 2019-04-14 DIAGNOSIS — Z862 Personal history of diseases of the blood and blood-forming organs and certain disorders involving the immune mechanism: Secondary | ICD-10-CM | POA: Diagnosis not present

## 2019-04-14 DIAGNOSIS — E872 Acidosis: Secondary | ICD-10-CM | POA: Diagnosis not present

## 2019-04-14 LAB — CBC WITH DIFFERENTIAL/PLATELET
Abs Immature Granulocytes: 0.06 10*3/uL (ref 0.00–0.07)
Basophils Absolute: 0 10*3/uL (ref 0.0–0.1)
Basophils Relative: 1 %
Eosinophils Absolute: 0.2 10*3/uL (ref 0.0–0.5)
Eosinophils Relative: 5 %
HCT: 36.4 % (ref 36.0–46.0)
Hemoglobin: 11.3 g/dL — ABNORMAL LOW (ref 12.0–15.0)
Immature Granulocytes: 1 %
Lymphocytes Relative: 24 %
Lymphs Abs: 1.1 10*3/uL (ref 0.7–4.0)
MCH: 28.9 pg (ref 26.0–34.0)
MCHC: 31 g/dL (ref 30.0–36.0)
MCV: 93.1 fL (ref 80.0–100.0)
Monocytes Absolute: 0.4 10*3/uL (ref 0.1–1.0)
Monocytes Relative: 9 %
Neutro Abs: 2.8 10*3/uL (ref 1.7–7.7)
Neutrophils Relative %: 60 %
Platelets: 282 10*3/uL (ref 150–400)
RBC: 3.91 MIL/uL (ref 3.87–5.11)
RDW: 16.1 % — ABNORMAL HIGH (ref 11.5–15.5)
WBC: 4.7 10*3/uL (ref 4.0–10.5)
nRBC: 0 % (ref 0.0–0.2)

## 2019-04-14 LAB — PHOSPHORUS: Phosphorus: 3.3 mg/dL (ref 2.5–4.6)

## 2019-04-14 LAB — COMPREHENSIVE METABOLIC PANEL
ALT: 13 U/L (ref 0–44)
AST: 13 U/L — ABNORMAL LOW (ref 15–41)
Albumin: 2.8 g/dL — ABNORMAL LOW (ref 3.5–5.0)
Alkaline Phosphatase: 73 U/L (ref 38–126)
Anion gap: 8 (ref 5–15)
BUN: 13 mg/dL (ref 8–23)
CO2: 17 mmol/L — ABNORMAL LOW (ref 22–32)
Calcium: 8.6 mg/dL — ABNORMAL LOW (ref 8.9–10.3)
Chloride: 111 mmol/L (ref 98–111)
Creatinine, Ser: 0.69 mg/dL (ref 0.44–1.00)
GFR calc Af Amer: >60 mL/min (ref >60)
GFR calc non Af Amer: >60 mL/min (ref >60)
Glucose, Bld: 105 mg/dL — ABNORMAL HIGH (ref 70–99)
Potassium: 3.6 mmol/L (ref 3.5–5.1)
Sodium: 136 mmol/L (ref 135–145)
Total Bilirubin: 0.4 mg/dL (ref 0.3–1.2)
Total Protein: 5.6 g/dL — ABNORMAL LOW (ref 6.5–8.1)

## 2019-04-14 LAB — MAGNESIUM: Magnesium: 1.7 mg/dL (ref 1.7–2.4)

## 2019-04-14 MED ORDER — ONDANSETRON HCL 4 MG PO TABS: 4.0000 mg | ORAL_TABLET | Freq: Four times a day (QID) | ORAL | 0 refills | Status: DC | PRN

## 2019-04-14 MED ORDER — MEGESTROL ACETATE 400 MG/10ML PO SUSP: 400.0000 mg | Freq: Every day | ORAL | 0 refills | Status: DC

## 2019-04-14 MED ORDER — IPRATROPIUM-ALBUTEROL 0.5-2.5 (3) MG/3ML IN SOLN: 3.0000 mL | Freq: Four times a day (QID) | RESPIRATORY_TRACT | 0 refills | Status: DC | PRN

## 2019-04-14 MED ORDER — CEFDINIR 300 MG PO CAPS: 300.0000 mg | ORAL_CAPSULE | Freq: Two times a day (BID) | ORAL | 0 refills | Status: DC

## 2019-04-14 NOTE — Progress Notes (Signed)
SATURATION QUALIFICATIONS: (This note is used to comply with regulatory documentation for home oxygen)  Patient Saturations on Room Air at Rest = 100%  Patient Saturations on Room Air while Ambulating = 98%  Patient Saturations on 0 Liters of oxygen while Ambulating = 98%  Please briefly explain why patient needs home oxygen: Pt tolerated ambulation well without oxygen.
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
## 2019-04-14 NOTE — Progress Notes (Signed)
[employer] called for 1:00PM pickup.

## 2019-04-15 ENCOUNTER — Non-Acute Institutional Stay (SKILLED_NURSING_FACILITY): Payer: Medicare Other | Admitting: Adult Health

## 2019-04-15 ENCOUNTER — Encounter: Payer: Self-pay | Admitting: Adult Health

## 2019-04-15 DIAGNOSIS — K219 Gastro-esophageal reflux disease without esophagitis: Secondary | ICD-10-CM

## 2019-04-15 DIAGNOSIS — J189 Pneumonia, unspecified organism: Secondary | ICD-10-CM | POA: Diagnosis not present

## 2019-04-15 DIAGNOSIS — J449 Chronic obstructive pulmonary disease, unspecified: Secondary | ICD-10-CM | POA: Diagnosis not present

## 2019-04-15 DIAGNOSIS — R531 Weakness: Secondary | ICD-10-CM

## 2019-04-15 DIAGNOSIS — S72002A Fracture of unspecified part of neck of left femur, initial encounter for closed fracture: Secondary | ICD-10-CM | POA: Diagnosis not present

## 2019-04-15 DIAGNOSIS — J9601 Acute respiratory failure with hypoxia: Secondary | ICD-10-CM

## 2019-04-15 DIAGNOSIS — I10 Essential (primary) hypertension: Secondary | ICD-10-CM

## 2019-04-15 DIAGNOSIS — R630 Anorexia: Secondary | ICD-10-CM
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
## 2019-04-16 DIAGNOSIS — J9601 Acute respiratory failure with hypoxia: Secondary | ICD-10-CM | POA: Insufficient documentation

## 2019-04-16 DIAGNOSIS — R630 Anorexia: Secondary | ICD-10-CM | POA: Insufficient documentation

## 2019-04-16 DIAGNOSIS — J189 Pneumonia, unspecified organism: Secondary | ICD-10-CM | POA: Insufficient documentation

## 2019-04-16 DIAGNOSIS — I10 Essential (primary) hypertension: Secondary | ICD-10-CM | POA: Insufficient documentation

## 2019-04-18 ENCOUNTER — Encounter: Payer: Self-pay | Admitting: Internal Medicine

## 2019-04-18 ENCOUNTER — Non-Acute Institutional Stay (SKILLED_NURSING_FACILITY): Payer: Medicare Other | Admitting: Internal Medicine

## 2019-04-18 DIAGNOSIS — J9601 Acute respiratory failure with hypoxia: Secondary | ICD-10-CM

## 2019-04-18 DIAGNOSIS — R630 Anorexia: Secondary | ICD-10-CM | POA: Diagnosis not present

## 2019-04-18 DIAGNOSIS — R627 Adult failure to thrive: Secondary | ICD-10-CM | POA: Insufficient documentation

## 2019-04-18 DIAGNOSIS — I358 Other nonrheumatic aortic valve disorders: Secondary | ICD-10-CM | POA: Insufficient documentation

## 2019-04-18 NOTE — Patient Instructions (Signed)
See assessment and plan under each diagnosis in the problem list and acutely for this visit 

## 2019-04-18 NOTE — Assessment & Plan Note (Addendum)
Continue nutritional supplements at the SNF; Nutrition consultation. Continue Reglan and Megace. D/C NSAIDS as high risk ulcer in context of hiatal hernia

## 2019-04-18 NOTE — Assessment & Plan Note (Signed)
Continue pulmonary toilet at SNF. 

## 2019-04-18 NOTE — Assessment & Plan Note (Signed)
PT/OT at SNF °

## 2019-04-18 NOTE — Progress Notes (Signed)
NURSING HOME LOCATION:  [facility] ROOM NUMBER:  306-B  CODE STATUS:  Full Code  PCP:  Shawnee Knapp, MD  No address on file  This is a comprehensive admission note to [facility] performed on this date less than 30 days from date of admission. Included are preadmission medical/surgical history; reconciled medication list; family history; social history and comprehensive review of systems.  Corrections and additions to the records were documented. Comprehensive physical exam was also performed. Additionally a clinical summary was entered for each active diagnosis pertinent to this admission in the Problem List to enhance continuity of care.  HPI: Patient was hospitalized 6/14-6/18/2020 for generalized weakness and anorexia.  The patient had total left hip replacement 03/03/2019 by Dr. Doreatha Martin and was in rehab for 19 days.  She had been at home approximately 2 weeks prior to this 6/14 admission. She developed progressive weakness to the point that she could not conduct activities of daily living such as hygiene.  She describes severe anorexia with abnormal taste.   In the ED global weakness was documented.  With ambulation O2 sats dropped into the 80s. She was admitted for the weakness and the hypoxia.  COVID-19 testing was negative; d-dimer was 2.06.  Chest x-ray demonstrated retrocardiac opacity felt to be stomach contents. Associated airspace disease could not be definitely excluded.  Chronic hyper inflation was present with possible small left pleural effusion.  CT scan could not be performed due to severe IVP dye allergy.  Clinical impression was PTE versus pneumonia. Most likely etiology was felt to be healthcare associated pneumonia as PTE was ruled out with VQ scan which revealed low probability for PTE.  Follow-up chest x-ray revealed COPD with no active process. She was transitioned from Levaquin to cefdinir which she was was to complete at the SNF.  Generalized weakness was  attributed to deconditioning and the severe anorexia with poor oral intake in the postop setting.  Boost and Magic cups were supplemented.  Megace was also initiated. Hyponatremia was resolved; this was attributed to poor oral intake.  Past medical and surgical history: Includes dyslipidemia, essential hypertension, chronic anemia, osteoporosis, seasonal allergies, and glaucoma.  She has had breast surgery in addition to the intertrochanteric intramedullary nail procedure.  Social history: Nondrinker: Never smoked despite the history of COPD.  She is a retired Marine scientist.  Family history:Strong FH COPD.   Review of systems: She is oriented and very intelligent.  She denies any upper respiratory tract or lower respiratory tract symptoms despite the diagnosis of HCAP.  She did not have symptoms while hospitalized or at present.  She states that her nausea is better.  It was the nausea and inability to eat which resulted in the weakness.  She states that she has a hiatal hernia and has been told "half my stomach is in the chest".  Consequently she is able to eat only small portions of meals @ a time.  She denies frank dysphagia or other GI symptoms.  Constitutional: No fever  Eyes: No redness, discharge, pain, vision change ENT/mouth: No nasal congestion, purulent discharge, earache, change in hearing, sore throat  Cardiovascular: No chest pain, palpitations, paroxysmal nocturnal dyspnea, claudication, edema  Respiratory: No cough, sputum production, hemoptysis, DOE, significant snoring, apnea  Gastrointestinal: No heartburn,  abdominal pain, rectal bleeding, melena, change in bowels Genitourinary: No dysuria, hematuria, pyuria, incontinence, nocturia Musculoskeletal: No joint stiffness, joint swelling, pain Dermatologic: No rash, pruritus, change in appearance of skin Neurologic: No dizziness, headache, syncope,  seizures, numbness, tingling Psychiatric: No significant anxiety, depression, insomnia,  anorexia Endocrine: No change in hair/skin/nails, excessive thirst, excessive hunger, excessive urination  Hematologic/lymphatic: No significant bruising, lymphadenopathy, abnormal bleeding Allergy/immunology: No itchy/watery eyes, significant sneezing, urticaria, angioedema  Physical exam:  Pertinent or positive findings: She appears thin but adequately nourished.  She is markedly hard of hearing.  She has intermittent exotropia of the right eye.  Heart sounds are markedly distant.  Pedal pulses are palpable.  She has minor osteoarthritic changes of the hands.  General appearance:  no acute distress, increased work of breathing is present.   Lymphatic: No lymphadenopathy about the head, neck, axilla. Eyes: No conjunctival inflammation or lid edema is present. There is no scleral icterus. Ears:  External ear exam shows no significant lesions or deformities.   Nose:  External nasal examination shows no deformity or inflammation. Nasal mucosa are pink and moist without lesions, exudates Oral exam: Lips and gums are healthy appearing.There is no oropharyngeal erythema or exudate. Neck:  No thyromegaly, masses, tenderness noted.    Heart:  No gallop, murmur, click, rub.  Lungs: Chest clear to auscultation without wheezes, rhonchi, rales, rubs. Abdomen: Bowel sounds are normal.  Abdomen is soft and nontender with no organomegaly, hernias, masses. GU: Deferred  Extremities:  No cyanosis, clubbing, edema. Neurologic exam:Balance, Rhomberg, finger to nose testing could not be completed due to clinical state Deep tendon reflexes are equal Skin: Warm & dry w/o tenting. No significant lesions or rash.  See clinical summary under each active problem in the Problem List with associated updated therapeutic plan

## 2019-04-19 ENCOUNTER — Encounter: Payer: Self-pay | Admitting: Internal Medicine

## 2019-04-19 DIAGNOSIS — Z20822 Contact with and (suspected) exposure to covid-19: Secondary | ICD-10-CM | POA: Insufficient documentation

## 2019-04-20 DIAGNOSIS — R262 Difficulty in walking, not elsewhere classified: Secondary | ICD-10-CM | POA: Diagnosis not present

## 2019-04-20 DIAGNOSIS — J449 Chronic obstructive pulmonary disease, unspecified: Secondary | ICD-10-CM | POA: Diagnosis not present

## 2019-04-20 DIAGNOSIS — J9611 Chronic respiratory failure with hypoxia: Secondary | ICD-10-CM | POA: Diagnosis not present

## 2019-04-20 DIAGNOSIS — J9601 Acute respiratory failure with hypoxia: Secondary | ICD-10-CM | POA: Diagnosis not present

## 2019-04-20 DIAGNOSIS — K5900 Constipation, unspecified: Secondary | ICD-10-CM | POA: Diagnosis not present

## 2019-04-20 DIAGNOSIS — K219 Gastro-esophageal reflux disease without esophagitis: Secondary | ICD-10-CM | POA: Diagnosis not present

## 2019-04-20 DIAGNOSIS — I10 Essential (primary) hypertension: Secondary | ICD-10-CM | POA: Diagnosis not present

## 2019-04-20 DIAGNOSIS — M6281 Muscle weakness (generalized): Secondary | ICD-10-CM | POA: Diagnosis not present

## 2019-04-20 DIAGNOSIS — Z96641 Presence of right artificial hip joint: Secondary | ICD-10-CM | POA: Diagnosis not present

## 2019-04-20 DIAGNOSIS — E785 Hyperlipidemia, unspecified: Secondary | ICD-10-CM | POA: Diagnosis not present

## 2019-04-20 DIAGNOSIS — Z471 Aftercare following joint replacement surgery: Secondary | ICD-10-CM | POA: Diagnosis not present

## 2019-04-20 DIAGNOSIS — Z20828 Contact with and (suspected) exposure to other viral communicable diseases: Secondary | ICD-10-CM | POA: Diagnosis not present

## 2019-04-20 DIAGNOSIS — U071 COVID-19: Secondary | ICD-10-CM | POA: Diagnosis not present

## 2019-04-20 DIAGNOSIS — R0902 Hypoxemia: Secondary | ICD-10-CM | POA: Diagnosis not present

## 2019-04-20 DIAGNOSIS — E871 Hypo-osmolality and hyponatremia: Secondary | ICD-10-CM | POA: Diagnosis not present

## 2019-04-21 ENCOUNTER — Other Ambulatory Visit: Payer: Self-pay | Admitting: Adult Health

## 2019-04-21 DIAGNOSIS — J9601 Acute respiratory failure with hypoxia: Secondary | ICD-10-CM | POA: Diagnosis not present

## 2019-04-21 DIAGNOSIS — E871 Hypo-osmolality and hyponatremia: Secondary | ICD-10-CM | POA: Diagnosis not present

## 2019-04-21 DIAGNOSIS — K219 Gastro-esophageal reflux disease without esophagitis: Secondary | ICD-10-CM | POA: Diagnosis not present

## 2019-04-21 DIAGNOSIS — J449 Chronic obstructive pulmonary disease, unspecified: Secondary | ICD-10-CM | POA: Diagnosis not present

## 2019-05-04 DIAGNOSIS — Z03818 Encounter for observation for suspected exposure to other biological agents ruled out: Secondary | ICD-10-CM | POA: Diagnosis not present

## 2019-05-09 DIAGNOSIS — S72002D Fracture of unspecified part of neck of left femur, subsequent encounter for closed fracture with routine healing: Secondary | ICD-10-CM | POA: Diagnosis not present

## 2019-06-21 DIAGNOSIS — S72002D Fracture of unspecified part of neck of left femur, subsequent encounter for closed fracture with routine healing: Secondary | ICD-10-CM | POA: Diagnosis not present

## 2019-06-28 ENCOUNTER — Other Ambulatory Visit: Payer: Self-pay | Admitting: Cardiovascular Disease

## 2019-06-28 ENCOUNTER — Other Ambulatory Visit: Payer: Self-pay | Admitting: Adult Health

## 2019-06-28 NOTE — Telephone Encounter (Signed)
Please advise if OK to refill. Last seen 05/04/2017. Thank you!

## 2019-07-07 ENCOUNTER — Telehealth: Payer: Self-pay | Admitting: Adult Health

## 2019-07-07 NOTE — Telephone Encounter (Signed)
Received faxed refill request from [facility] for patient's Nexium 42m.  Former PCP patient of Dr NJeannine Kitten last seen 11.18.19: Instructions Today we updated your med list in our EPIC system...    Continue your current medications the same...   For your chest wall pain & coccyx pain after the fall--    Apply heat asm needed for comfort...    Use a cushion or "donut" for sitting...    Give it some time, but call if not resolving...   Continue the ADVAIR 2 inhalations twice daily...   We wrote for TRAMADOL 577mtabs to take up to 3 times daily as needed for pain & take it w/ extra strength Tylenol to boost it's effect...    Start that exercise program-- stair stepper or silver sneakers etc...   [facility] on the PROLIA shots for your bones...   We discussed my up-coming retirement & decided to request DrShaw to assume the responsibility for writing your Advair going forward & to re-consult [facility] for any breathing problems in the future...   Sending you my very best wishes for good health & happiness in the coming years...      Last refill was 6.24.2020 by [facility] for #30 with 1 refill.  Request sent back to pharmacy with note to pharmacy to please defer to patient's new PCP Dr EvDelman Cheadlend that last refill was a courtesy; Dr NaLenna Gilfords no longer practicing.  Nothing further needed at this time; will sign off.

## 2019-07-08 ENCOUNTER — Other Ambulatory Visit: Payer: Self-pay | Admitting: Adult Health

## 2019-07-08 ENCOUNTER — Other Ambulatory Visit: Payer: Self-pay | Admitting: Cardiovascular Disease

## 2019-08-15 ENCOUNTER — Other Ambulatory Visit: Payer: Self-pay

## 2019-08-15 MED ORDER — ROSUVASTATIN CALCIUM 10 MG PO TABS: 10.0000 mg | ORAL_TABLET | Freq: Every day | ORAL | 0 refills | Status: DC

## 2019-08-19 ENCOUNTER — Telehealth: Payer: Self-pay | Admitting: Family Medicine

## 2019-08-19 NOTE — Telephone Encounter (Signed)
Called pt to verify that records were sent to [facility] office in July and gave her the option to pick up paper copies at the office. Pt verbalized understanding. She will contact [facility] office again

## 2019-09-06 DIAGNOSIS — S72002D Fracture of unspecified part of neck of left femur, subsequent encounter for closed fracture with routine healing: Secondary | ICD-10-CM | POA: Diagnosis not present

## 2019-10-31 DIAGNOSIS — Z23 Encounter for immunization: Secondary | ICD-10-CM | POA: Diagnosis not present

## 2019-11-28 DIAGNOSIS — Z23 Encounter for immunization: Secondary | ICD-10-CM | POA: Diagnosis not present
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
## 2020-02-08 ENCOUNTER — Ambulatory Visit: Payer: Medicare Other | Admitting: General Practice

## 2020-02-09 ENCOUNTER — Other Ambulatory Visit: Payer: Self-pay

## 2020-02-09 ENCOUNTER — Encounter: Payer: Self-pay | Admitting: General Practice

## 2020-02-09 ENCOUNTER — Ambulatory Visit (INDEPENDENT_AMBULATORY_CARE_PROVIDER_SITE_OTHER): Payer: Medicare Other | Admitting: General Practice

## 2020-02-09 VITALS — BP 126/80 | Temp 97.2°F | Ht 62.0 in | Wt 120.0 lb

## 2020-02-09 DIAGNOSIS — I10 Essential (primary) hypertension: Secondary | ICD-10-CM

## 2020-02-09 DIAGNOSIS — R7303 Prediabetes: Secondary | ICD-10-CM

## 2020-02-09 DIAGNOSIS — I340 Nonrheumatic mitral (valve) insufficiency: Secondary | ICD-10-CM

## 2020-02-09 DIAGNOSIS — E7800 Pure hypercholesterolemia, unspecified: Secondary | ICD-10-CM | POA: Diagnosis not present

## 2020-02-09 DIAGNOSIS — Z79899 Other long term (current) drug therapy: Secondary | ICD-10-CM | POA: Diagnosis not present

## 2020-02-09 NOTE — Patient Instructions (Signed)
Medication Instructions:  The current medical regimen is effective;  continue present plan and medications as directed. Please refer to the Current Medication list given to you today. *If you need a refill on your cardiac medications before your next appointment, please call your pharmacy*  Lab Work: LIPID AND A1C TODAY HERE IN OUR OFFICE If you have labs (blood work) drawn today and your tests are completely normal, you will receive your results only by:  [place] (if you have MyChart) OR A paper copy in the mail.  If you have any lab test that is abnormal or we need to change your treatment, we will call you to review the results. You may go to any [facility] that is convenient for you however, we do have a lab in our office that is able to assist you. You DO NOT need an appointment for our lab.  The lab is open 8:00am and closes at 4:00pm. Lunch 12:45 - 1:45pm.  Testing/Procedures: Echocardiogram - Your physician has requested that you have an echocardiogram. Echocardiography is a painless test that uses sound waves to create images of your heart. It provides your doctor with information about the size and shape of your heart and how well your heart's chambers and valves are working. This procedure takes approximately one hour. There are no restrictions for this procedure. This will be performed at our [facility] location - [address].  Special Instructions PLEASE READ AND FOLLOW SALTY 6-ATTACHED  Follow-Up: Your next appointment:  6 month(s) Please call our office 2 months in advance to schedule this appointment In Person with Kirk Ruths, MD.  At [facility], you and your health needs are our priority.  As part of our continuing mission to provide you with exceptional heart care, we have created designated Provider Care Teams.  These Care Teams include your primary Cardiologist (physician) and Advanced Practice Providers (APPs -  Physician Assistants and Nurse  Practitioners) who all work together to provide you with the care you need, when you need it.  We recommend signing up for the patient portal called "MyChart".  Sign up information is provided on this After Visit Summary.  MyChart is used to connect with patients for Virtual Visits (Telemedicine).  Patients are able to view lab/test results, encounter notes, upcoming appointments, etc.  Non-urgent messages can be sent to your provider as well.   To learn more about what you can do with MyChart, go to [email or web address].

## 2020-02-10 ENCOUNTER — Telehealth: Payer: Self-pay | Admitting: Cardiology

## 2020-02-10 LAB — HEMOGLOBIN A1C
Est. average glucose Bld gHb Est-mCnc: 120 mg/dL
Hgb A1c MFr Bld: 5.8 % — ABNORMAL HIGH (ref 4.8–5.6)

## 2020-02-10 LAB — LIPID PANEL
Chol/HDL Ratio: 3.4 ratio (ref 0.0–4.4)
Cholesterol, Total: 179 mg/dL (ref 100–199)
HDL: 53 mg/dL (ref >39)
LDL Chol Calc (NIH): 101 mg/dL — ABNORMAL HIGH (ref 0–99)
Triglycerides: 144 mg/dL (ref 0–149)
VLDL Cholesterol Cal: 25 mg/dL (ref 5–40)

## 2020-02-10 NOTE — Telephone Encounter (Signed)
New Message   Pt is returning cal

## 2020-02-24 ENCOUNTER — Ambulatory Visit (HOSPITAL_COMMUNITY): Payer: Medicare Other | Attending: Internal Medicine

## 2020-02-24 ENCOUNTER — Other Ambulatory Visit: Payer: Self-pay

## 2020-02-24 DIAGNOSIS — I340 Nonrheumatic mitral (valve) insufficiency: Secondary | ICD-10-CM | POA: Diagnosis not present

## 2020-03-01 DIAGNOSIS — Z961 Presence of intraocular lens: Secondary | ICD-10-CM | POA: Diagnosis not present

## 2020-03-01 DIAGNOSIS — H35353 Cystoid macular degeneration, bilateral: Secondary | ICD-10-CM | POA: Diagnosis not present

## 2020-03-01 DIAGNOSIS — H2512 Age-related nuclear cataract, left eye: Secondary | ICD-10-CM | POA: Diagnosis not present

## 2020-03-01 DIAGNOSIS — H18511 Endothelial corneal dystrophy, right eye: Secondary | ICD-10-CM | POA: Diagnosis not present

## 2020-03-01 DIAGNOSIS — H353132 Nonexudative age-related macular degeneration, bilateral, intermediate dry stage: Secondary | ICD-10-CM | POA: Diagnosis not present

## 2020-03-07 DIAGNOSIS — H2512 Age-related nuclear cataract, left eye: Secondary | ICD-10-CM | POA: Diagnosis not present

## 2020-03-07 DIAGNOSIS — H18519 Endothelial corneal dystrophy, unspecified eye: Secondary | ICD-10-CM | POA: Diagnosis not present

## 2020-03-07 DIAGNOSIS — H02401 Unspecified ptosis of right eyelid: Secondary | ICD-10-CM | POA: Diagnosis not present

## 2020-03-07 DIAGNOSIS — Z947 Corneal transplant status: Secondary | ICD-10-CM | POA: Diagnosis not present

## 2020-03-07 DIAGNOSIS — Z961 Presence of intraocular lens: Secondary | ICD-10-CM | POA: Diagnosis not present

## 2020-05-01 DIAGNOSIS — M810 Age-related osteoporosis without current pathological fracture: Secondary | ICD-10-CM | POA: Diagnosis not present

## 2020-05-10 ENCOUNTER — Inpatient Hospital Stay (HOSPITAL_COMMUNITY)
Admission: EM | Admit: 2020-05-10 | Discharge: 2020-05-16 | DRG: 543 | Disposition: A | Discharge status: Skilled Nursing Facility | Payer: Medicare Other | Attending: Internal Medicine | Admitting: Internal Medicine

## 2020-05-10 ENCOUNTER — Emergency Department (HOSPITAL_COMMUNITY): Payer: Medicare Other

## 2020-05-10 ENCOUNTER — Encounter (HOSPITAL_COMMUNITY): Payer: Self-pay

## 2020-05-10 DIAGNOSIS — Z7401 Bed confinement status: Secondary | ICD-10-CM | POA: Diagnosis not present

## 2020-05-10 DIAGNOSIS — W1830XA Fall on same level, unspecified, initial encounter: Secondary | ICD-10-CM | POA: Diagnosis present

## 2020-05-10 DIAGNOSIS — M84350A Stress fracture, pelvis, initial encounter for fracture: Principal | ICD-10-CM | POA: Diagnosis present

## 2020-05-10 DIAGNOSIS — R627 Adult failure to thrive: Secondary | ICD-10-CM | POA: Diagnosis present

## 2020-05-10 DIAGNOSIS — Z888 Allergy status to other drugs, medicaments and biological substances status: Secondary | ICD-10-CM

## 2020-05-10 DIAGNOSIS — S32592S Other specified fracture of left pubis, sequela: Secondary | ICD-10-CM

## 2020-05-10 DIAGNOSIS — Z8249 Family history of ischemic heart disease and other diseases of the circulatory system: Secondary | ICD-10-CM

## 2020-05-10 DIAGNOSIS — Z03818 Encounter for observation for suspected exposure to other biological agents ruled out: Secondary | ICD-10-CM | POA: Diagnosis not present

## 2020-05-10 DIAGNOSIS — R7303 Prediabetes: Secondary | ICD-10-CM | POA: Diagnosis not present

## 2020-05-10 DIAGNOSIS — Z6821 Body mass index (BMI) 21.0-21.9, adult: Secondary | ICD-10-CM

## 2020-05-10 DIAGNOSIS — E785 Hyperlipidemia, unspecified: Secondary | ICD-10-CM | POA: Diagnosis present

## 2020-05-10 DIAGNOSIS — M6281 Muscle weakness (generalized): Secondary | ICD-10-CM | POA: Diagnosis not present

## 2020-05-10 DIAGNOSIS — R1312 Dysphagia, oropharyngeal phase: Secondary | ICD-10-CM | POA: Diagnosis not present

## 2020-05-10 DIAGNOSIS — R339 Retention of urine, unspecified: Secondary | ICD-10-CM | POA: Diagnosis not present

## 2020-05-10 DIAGNOSIS — M810 Age-related osteoporosis without current pathological fracture: Secondary | ICD-10-CM | POA: Diagnosis present

## 2020-05-10 DIAGNOSIS — E872 Acidosis, unspecified: Secondary | ICD-10-CM

## 2020-05-10 DIAGNOSIS — Z20822 Contact with and (suspected) exposure to covid-19: Secondary | ICD-10-CM | POA: Diagnosis present

## 2020-05-10 DIAGNOSIS — Z885 Allergy status to narcotic agent status: Secondary | ICD-10-CM | POA: Diagnosis not present

## 2020-05-10 DIAGNOSIS — R011 Cardiac murmur, unspecified: Secondary | ICD-10-CM | POA: Diagnosis present

## 2020-05-10 DIAGNOSIS — N179 Acute kidney failure, unspecified: Secondary | ICD-10-CM | POA: Diagnosis not present

## 2020-05-10 DIAGNOSIS — K5900 Constipation, unspecified: Secondary | ICD-10-CM | POA: Diagnosis not present

## 2020-05-10 DIAGNOSIS — S32502A Unspecified fracture of left pubis, initial encounter for closed fracture: Secondary | ICD-10-CM | POA: Diagnosis not present

## 2020-05-10 DIAGNOSIS — S72002A Fracture of unspecified part of neck of left femur, initial encounter for closed fracture: Secondary | ICD-10-CM | POA: Diagnosis present

## 2020-05-10 DIAGNOSIS — Z91041 Radiographic dye allergy status: Secondary | ICD-10-CM | POA: Diagnosis not present

## 2020-05-10 DIAGNOSIS — I340 Nonrheumatic mitral (valve) insufficiency: Secondary | ICD-10-CM | POA: Diagnosis present

## 2020-05-10 DIAGNOSIS — H5410 Blindness, one eye, low vision other eye, unspecified eyes: Secondary | ICD-10-CM | POA: Diagnosis not present

## 2020-05-10 DIAGNOSIS — I119 Hypertensive heart disease without heart failure: Secondary | ICD-10-CM | POA: Diagnosis present

## 2020-05-10 DIAGNOSIS — J449 Chronic obstructive pulmonary disease, unspecified: Secondary | ICD-10-CM | POA: Diagnosis not present

## 2020-05-10 DIAGNOSIS — S79912A Unspecified injury of left hip, initial encounter: Secondary | ICD-10-CM | POA: Diagnosis not present

## 2020-05-10 DIAGNOSIS — D509 Iron deficiency anemia, unspecified: Secondary | ICD-10-CM | POA: Diagnosis present

## 2020-05-10 DIAGNOSIS — I10 Essential (primary) hypertension: Secondary | ICD-10-CM | POA: Diagnosis present

## 2020-05-10 DIAGNOSIS — E46 Unspecified protein-calorie malnutrition: Secondary | ICD-10-CM | POA: Diagnosis present

## 2020-05-10 DIAGNOSIS — M2550 Pain in unspecified joint: Secondary | ICD-10-CM | POA: Diagnosis not present

## 2020-05-10 DIAGNOSIS — Z7982 Long term (current) use of aspirin: Secondary | ICD-10-CM | POA: Diagnosis not present

## 2020-05-10 DIAGNOSIS — K219 Gastro-esophageal reflux disease without esophagitis: Secondary | ICD-10-CM | POA: Diagnosis present

## 2020-05-10 DIAGNOSIS — R29898 Other symptoms and signs involving the musculoskeletal system: Secondary | ICD-10-CM | POA: Diagnosis not present

## 2020-05-10 DIAGNOSIS — E7800 Pure hypercholesterolemia, unspecified: Secondary | ICD-10-CM | POA: Diagnosis present

## 2020-05-10 DIAGNOSIS — I447 Left bundle-branch block, unspecified: Secondary | ICD-10-CM | POA: Diagnosis present

## 2020-05-10 DIAGNOSIS — S32592A Other specified fracture of left pubis, initial encounter for closed fracture: Secondary | ICD-10-CM

## 2020-05-10 DIAGNOSIS — R42 Dizziness and giddiness: Secondary | ICD-10-CM | POA: Diagnosis not present

## 2020-05-10 DIAGNOSIS — R52 Pain, unspecified: Secondary | ICD-10-CM | POA: Diagnosis not present

## 2020-05-10 DIAGNOSIS — S72002D Fracture of unspecified part of neck of left femur, subsequent encounter for closed fracture with routine healing: Secondary | ICD-10-CM | POA: Diagnosis not present

## 2020-05-10 DIAGNOSIS — D649 Anemia, unspecified: Secondary | ICD-10-CM | POA: Diagnosis not present

## 2020-05-10 DIAGNOSIS — S32592D Other specified fracture of left pubis, subsequent encounter for fracture with routine healing: Secondary | ICD-10-CM | POA: Diagnosis not present

## 2020-05-10 DIAGNOSIS — Z825 Family history of asthma and other chronic lower respiratory diseases: Secondary | ICD-10-CM

## 2020-05-10 DIAGNOSIS — Z79899 Other long term (current) drug therapy: Secondary | ICD-10-CM | POA: Diagnosis not present

## 2020-05-10 DIAGNOSIS — W19XXXA Unspecified fall, initial encounter: Secondary | ICD-10-CM

## 2020-05-10 DIAGNOSIS — R5381 Other malaise: Secondary | ICD-10-CM | POA: Diagnosis not present

## 2020-05-10 DIAGNOSIS — S72009A Fracture of unspecified part of neck of unspecified femur, initial encounter for closed fracture: Secondary | ICD-10-CM | POA: Diagnosis present

## 2020-05-10 DIAGNOSIS — R2681 Unsteadiness on feet: Secondary | ICD-10-CM | POA: Diagnosis not present

## 2020-05-10 LAB — PROTIME-INR
INR: 0.9 (ref 0.8–1.2)
Prothrombin Time: 12 seconds (ref 11.4–15.2)

## 2020-05-10 LAB — CBC WITH DIFFERENTIAL/PLATELET
Abs Immature Granulocytes: 0.15 10*3/uL — ABNORMAL HIGH (ref 0.00–0.07)
Basophils Absolute: 0 10*3/uL (ref 0.0–0.1)
Basophils Relative: 0 %
Eosinophils Absolute: 0.2 10*3/uL (ref 0.0–0.5)
Eosinophils Relative: 1 %
HCT: 43.5 % (ref 36.0–46.0)
Hemoglobin: 13.4 g/dL (ref 12.0–15.0)
Immature Granulocytes: 1 %
Lymphocytes Relative: 5 %
Lymphs Abs: 0.6 10*3/uL — ABNORMAL LOW (ref 0.7–4.0)
MCH: 27.9 pg (ref 26.0–34.0)
MCHC: 30.8 g/dL (ref 30.0–36.0)
MCV: 90.6 fL (ref 80.0–100.0)
Monocytes Absolute: 0.3 10*3/uL (ref 0.1–1.0)
Monocytes Relative: 2 %
Neutro Abs: 12.8 10*3/uL — ABNORMAL HIGH (ref 1.7–7.7)
Neutrophils Relative %: 91 %
Platelets: 187 10*3/uL (ref 150–400)
RBC: 4.8 MIL/uL (ref 3.87–5.11)
RDW: 14.8 % (ref 11.5–15.5)
WBC: 14.1 10*3/uL — ABNORMAL HIGH (ref 4.0–10.5)
nRBC: 0 % (ref 0.0–0.2)

## 2020-05-10 LAB — SARS CORONAVIRUS 2 BY RT PCR (HOSPITAL ORDER, PERFORMED IN ~~LOC~~ HOSPITAL LAB): SARS Coronavirus 2: NEGATIVE

## 2020-05-10 LAB — BASIC METABOLIC PANEL
Anion gap: 10 (ref 5–15)
BUN: 18 mg/dL (ref 8–23)
CO2: 20 mmol/L — ABNORMAL LOW (ref 22–32)
Calcium: 9.3 mg/dL (ref 8.9–10.3)
Chloride: 108 mmol/L (ref 98–111)
Creatinine, Ser: 1.07 mg/dL — ABNORMAL HIGH (ref 0.44–1.00)
GFR calc Af Amer: 53 mL/min — ABNORMAL LOW (ref >60)
GFR calc non Af Amer: 46 mL/min — ABNORMAL LOW (ref >60)
Glucose, Bld: 140 mg/dL — ABNORMAL HIGH (ref 70–99)
Potassium: 4.2 mmol/L (ref 3.5–5.1)
Sodium: 138 mmol/L (ref 135–145)

## 2020-05-10 MED ORDER — ONDANSETRON HCL 4 MG/2ML IJ SOLN
4.0000 mg | Freq: Once | INTRAMUSCULAR | Status: AC
  Administered 2020-05-10: 4 mg via INTRAVENOUS
  Filled 2020-05-10: qty 2

## 2020-05-10 MED ORDER — MORPHINE SULFATE (PF) 4 MG/ML IV SOLN
4.0000 mg | Freq: Once | INTRAVENOUS | Status: AC
  Administered 2020-05-10: 4 mg via INTRAVENOUS
  Filled 2020-05-10: qty 1

## 2020-05-10 MED ORDER — MOMETASONE FURO-FORMOTEROL FUM 200-5 MCG/ACT IN AERO
2.0000 | INHALATION_SPRAY | Freq: Two times a day (BID) | RESPIRATORY_TRACT | Status: DC
  Administered 2020-05-11 – 2020-05-16 (×10): 2 via RESPIRATORY_TRACT
  Filled 2020-05-10: qty 8.8

## 2020-05-10 MED ORDER — OXYCODONE HCL 5 MG PO TABS: 5.0000 mg | ORAL_TABLET | Freq: Four times a day (QID) | ORAL | Status: DC | PRN

## 2020-05-10 MED ORDER — ROSUVASTATIN CALCIUM 5 MG PO TABS
10.0000 mg | ORAL_TABLET | Freq: Every day | ORAL | Status: DC
  Administered 2020-05-10 – 2020-05-16 (×7): 10 mg via ORAL
  Filled 2020-05-10 (×6): qty 2

## 2020-05-10 MED ORDER — LACTATED RINGERS IV BOLUS
1000.0000 mL | Freq: Once | INTRAVENOUS | Status: AC
  Administered 2020-05-10: 1000 mL via INTRAVENOUS

## 2020-05-10 MED ORDER — HEPARIN SODIUM (PORCINE) 5000 UNIT/ML IJ SOLN
5000.0000 [IU] | Freq: Three times a day (TID) | INTRAMUSCULAR | Status: DC
  Administered 2020-05-10 – 2020-05-15 (×15): 5000 [IU] via SUBCUTANEOUS
  Filled 2020-05-10 (×15): qty 1

## 2020-05-10 MED ORDER — ASPIRIN EC 81 MG PO TBEC
81.0000 mg | DELAYED_RELEASE_TABLET | Freq: Every day | ORAL | Status: DC
  Administered 2020-05-10 – 2020-05-15 (×6): 81 mg via ORAL
  Filled 2020-05-10 (×6): qty 1

## 2020-05-10 MED ORDER — ACETAMINOPHEN 325 MG PO TABS
650.0000 mg | ORAL_TABLET | Freq: Four times a day (QID) | ORAL | Status: DC | PRN
  Administered 2020-05-10 – 2020-05-15 (×3): 650 mg via ORAL
  Filled 2020-05-10 (×4): qty 2

## 2020-05-10 MED ORDER — FENTANYL CITRATE (PF) 100 MCG/2ML IJ SOLN: 50.0000 ug | Freq: Once | INTRAMUSCULAR | Status: DC

## 2020-05-10 MED ORDER — OXYCODONE HCL 5 MG PO TABS
5.0000 mg | ORAL_TABLET | ORAL | Status: DC | PRN
  Administered 2020-05-10 – 2020-05-16 (×3): 5 mg via ORAL
  Filled 2020-05-10 (×3): qty 1

## 2020-05-10 MED ORDER — IPRATROPIUM-ALBUTEROL 0.5-2.5 (3) MG/3ML IN SOLN: 3.0000 mL | Freq: Four times a day (QID) | RESPIRATORY_TRACT | Status: DC | PRN

## 2020-05-10 MED ORDER — POLYETHYLENE GLYCOL 3350 17 G PO PACK: 17.0000 g | PACK | Freq: Every day | ORAL | Status: DC | PRN
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
## 2020-05-10 NOTE — Plan of Care (Signed)

## 2020-05-10 NOTE — ED Triage Notes (Signed)
Pt BIB [facility] with a fall from standing, unknown how she fell. Denies LOC, hit head, dizziness, nausea/vomitting, CP.  Possible L hip fracture, shortening of L leg. L hip fracture last year. AOx4, not on thinners except asprin, VSS.

## 2020-05-10 NOTE — ED Provider Notes (Signed)
[facility] EMERGENCY DEPARTMENT Provider Note   CSN: [id number] Arrival date & time: 05/10/20  7408     History Chief Complaint  Patient presents with  . Fall    Tina Mercado is a 86 y.o. female with a history of COPD, hypertension, hyperlipidemia, & prior L hip fracture S/p surgical intervention who presents to the ED status post EMS with complaints of left hip pain status post mechanical fall.  Patient states she was walking, thinks she might have missed stepped, and fell onto her left side.  She denies head injury or loss of consciousness.  She hit the left hip.  She has not ambulated since the fall.  She is complaining of only pain in the left hip, it is worse with movement, no alleviating factors.  No intervention prior to arrival.  She denies headache, neck pain, back pain, numbness, weakness, tingling, vomiting, or syncope.  She denies any lightheadedness, dizziness, chest pain, or dyspnea prior to the fall, she states it did not feel like she passed out or was going to.   HPI     Past Medical History:  Diagnosis Date  . Allergy   . Arrhythmia   . Cataract   . COPD (chronic obstructive pulmonary disease) ([place])   . GERD (gastroesophageal reflux disease)   . Glaucoma   . Hearing loss    does not wear hearing aids  . Heart murmur   . History of anemia   . History of diastolic dysfunction   . History of seasonal allergies   . Hyperlipidemia   . Hypertension   . Large hiatal hernia    see on thoracic spine xray  . Mitral regurgitation    mild to moderate  . Osteoporosis   . Wears glasses    readers    Patient Active Problem List   Diagnosis Date Noted  . Suspected COVID-19 virus infection 04/19/2019  . Adult failure to thrive syndrome 04/18/2019  . Acute respiratory failure with hypoxia ([place]) 04/16/2019  . HCAP (healthcare-associated pneumonia) 04/16/2019  . Anorexia 04/16/2019  . Hypertension   . Hyponatremia 04/10/2019  . Generalized  weakness 04/10/2019  . Hypoxemia 04/10/2019  . Prediabetes 09/28/2017  . Dizziness 09/15/2017  . Neuropathy 07/16/2017  . COPD mixed type ([place]) 04/10/2015  . Laryngopharyngeal reflux (LPR) 04/10/2015  . Chronic laryngitis 08/17/2014  . Blindness and low vision 04/13/2013  . Osteoporosis 12/23/2012  . Bronchitis 10/30/2011  . Benign hypertensive heart disease without heart failure 08/05/2011  . Pure hypercholesterolemia 08/05/2011  . Mitral regurgitation   . History of anemia   . History of diastolic dysfunction   . GERD (gastroesophageal reflux disease)   . History of seasonal allergies   . Large hiatal hernia 11/23/2004    Past Surgical History:  Procedure Laterality Date  . APPENDECTOMY    . BREAST SURGERY    . childbirth     x 1  . CORNEAL TRANSPLANT  july 2007  . EYE SURGERY    . INTRAMEDULLARY (IM) NAIL INTERTROCHANTERIC Left 03/03/2019   Procedure: INTRAMEDULLARY (IM) NAIL INTERTROCHANTRIC;  Surgeon: Shona Needles, MD;  Location: [place];  Service: Orthopedics;  Laterality: Left;  . TONSILLECTOMY AND ADENOIDECTOMY     age 24yrs     OB History   No obstetric history on file.     Family History  Problem Relation Age of Onset  . Heart disease Mother   . Emphysema Father   . Emphysema Brother   .  COPD Brother     Social History   Tobacco Use  . Smoking status: Never Smoker  . Smokeless tobacco: Never Used  Vaping Use  . Vaping Use: Never used  Substance Use Topics  . Alcohol use: No    Alcohol/week: 0.0 standard drinks  . Drug use: No    Home Medications Prior to Admission medications   Medication Sig Start Date End Date Taking? Authorizing Provider  aspirin EC 81 MG tablet Take 81 mg by mouth at bedtime.     [provider]  Cholecalciferol (VITAMIN D) 50 MCG (2000 UT) CAPS Take 2,000 Units by mouth at bedtime.    [provider]  docusate sodium (COLACE) 100 MG capsule Take 100 mg by mouth daily as needed for mild constipation.      [provider]  esomeprazole (NEXIUM) 40 MG capsule TAKE 1 CAPSULE (40 MG TOTAL) BY MOUTH AT BEDTIME. 04/21/19   Parrett, Tammy S, NP  ibuprofen (ADVIL) 200 MG tablet Take 200 mg by mouth every 6 (six) hours as needed for headache or mild pain.    [provider]  ipratropium-albuterol (DUONEB) 0.5-2.5 (3) MG/3ML SOLN Take 3 mLs by nebulization every 6 (six) hours as needed. 04/14/19   Raiford Noble Latif, DO  ketorolac (ACULAR) 0.5 % ophthalmic solution Place 1 drop into the right eye 2 (two) times daily.    [provider]  meclizine (ANTIVERT) 25 MG tablet Take 25 mg by mouth 3 (three) times daily as needed for dizziness.    [provider]  ondansetron (ZOFRAN) 4 MG tablet Take 1 tablet (4 mg total) by mouth every 6 (six) hours as needed for nausea. 04/14/19   Raiford Noble Latif, DO  rosuvastatin (CRESTOR) 10 MG tablet Take 1 tablet (10 mg total) by mouth daily. Patient needs to be seen in the office for further refills. 08/15/19   Skeet Latch, MD  timolol (BETIMOL) 0.5 % ophthalmic solution Place 1 drop into both eyes 2 (two) times daily.    [provider]    Allergies    Contrast media [iodinated diagnostic agents]; Antihistamines, diphenhydramine-type; Celebrex [celecoxib]; Darvocet [propoxyphene n-acetaminophen]; Demerol; Diphenhydramine hcl; Feldene [piroxicam]; Gabapentin; Meperidine; Propoxyphene; and Iodine-131  Review of Systems   Review of Systems  Constitutional: Negative for chills and fever.  Eyes: Negative for visual disturbance.  Respiratory: Negative for shortness of breath.   Cardiovascular: Negative for chest pain.  Gastrointestinal: Negative for abdominal pain and vomiting.  Musculoskeletal: Positive for arthralgias. Negative for back pain and neck pain.  Neurological: Negative for dizziness, seizures, syncope, weakness, light-headedness, numbness and headaches.  All other systems reviewed and are negative.   Physical  Exam Updated Vital Signs BP (!) 209/71 (BP Location: Right Arm)   Pulse 97   Temp 98.7 F (37.1 C) (Oral)   Resp (!) 22   LMP  (LMP Unknown)   SpO2 90%   Physical Exam Vitals and nursing note reviewed.  Constitutional:      General: She is not in acute distress.    Appearance: She is well-developed.  HENT:     Head: Normocephalic and atraumatic. No raccoon eyes or Battle's sign.     Right Ear: No hemotympanum.     Left Ear: No hemotympanum.  Eyes:     General:        Right eye: No discharge.        Left eye: No discharge.     Conjunctiva/sclera: Conjunctivae normal.  Pupils: Pupils are equal, round, and reactive to light.  Cardiovascular:     Rate and Rhythm: Normal rate and regular rhythm.     Heart sounds: No murmur heard.      Comments: 2+ symmetric DP pulses. Pulmonary:     Effort: No respiratory distress.     Breath sounds: Normal breath sounds. No wheezing or rales.  Chest:     Chest wall: No tenderness.  Abdominal:     General: There is no distension.     Palpations: Abdomen is soft.     Tenderness: There is no abdominal tenderness. There is no guarding or rebound.  Musculoskeletal:     Cervical back: No spinous process tenderness.     Comments: Upper extremities: No focal bony tenderness Back: No tenderness or palpable step-off. Lower extremities: Patient has intact active range of motion throughout the right lower extremity.  Left lower extremity able to actively move at all joints, she able to flex to about 90 degrees at the knee, some limitation @ knee and hip.  Tender over the left anterior hip/pelvis.  Otherwise nontender.  Skin:    General: Skin is warm and dry.     Findings: No rash.  Neurological:     Comments: Alert.  Clear speech.  Sensation grossly intact bilateral upper and lower extremities.  5-5 symmetric grip strength.  5-5 strength with plantar dorsiflexion bilaterally.  Psychiatric:        Behavior: Behavior normal.     ED Results /  Procedures / Treatments   Labs (all labs ordered are listed, but only abnormal results are displayed) Labs Reviewed  BASIC METABOLIC PANEL - Abnormal; Notable for the following components:      Result Value   CO2 20 (*)    Glucose, Bld 140 (*)    Creatinine, Ser 1.07 (*)    GFR calc non Af Amer 46 (*)    GFR calc Af Amer 53 (*)    All other components within normal limits  CBC WITH DIFFERENTIAL/PLATELET - Abnormal; Notable for the following components:   WBC 14.1 (*)    Neutro Abs 12.8 (*)    Lymphs Abs 0.6 (*)    Abs Immature Granulocytes 0.15 (*)    All other components within normal limits  SARS CORONAVIRUS 2 BY RT PCR [facility] ORDER, [place] [facility])  PROTIME-INR    EKG EKG Interpretation  Date/Time:  Thursday May 10 2020 09:59:05 EDT Ventricular Rate:  84 PR Interval:    QRS Duration: 129 QT Interval:  372 QTC Calculation: 440 R Axis:   48 Text Interpretation: Sinus rhythm Short PR interval Left bundle branch block Confirmed by Gerlene Fee [id number]) on 05/10/2020 10:02:26 AM   Radiology DG Hip Unilat With Pelvis 2-3 Views Left  Result Date: 05/10/2020 CLINICAL DATA:  Status post fall.  Pain in left hip area. EXAM: DG HIP (WITH OR WITHOUT PELVIS) 2-3V LEFT COMPARISON:  03/06/2019 FINDINGS: Previous intramedullary rod with hip screw is again noted and appears unchanged from the previous study. There is a subtle area of cortical disruption involving the left inferior pubic rami, suspicious for nondisplaced fracture. New from the prior exam. No findings to suggest hip fracture or dislocation. IMPRESSION: 1. Suspect nondisplaced fracture involving the left inferior pubic rami. Electronically Signed   By: Kerby Moors M.D.   On: 05/10/2020 10:36    Procedures Procedures (including critical care time)  Medications Ordered in ED Medications  fentaNYL (SUBLIMAZE) injection 50 mcg (  50 mcg Intravenous Not Given 05/10/20 1342)  ondansetron (ZOFRAN)  injection 4 mg (4 mg Intravenous Given 05/10/20 1134)  morphine 4 MG/ML injection 4 mg (4 mg Intravenous Given 05/10/20 1134)    ED Course  I have reviewed the triage vital signs and the nursing notes.  Pertinent labs & imaging results that were available during my care of the patient were reviewed by me and considered in my medical decision making (see chart for details).    MDM Rules/Calculators/A&P                          Patient presents to the ED with complaints of L hip pain S/p fall.  Patient nontoxic, in no apparent distress, vitals with hypertension.  No signs of serious head, neck, or back injury.  Patient has no focal neurologic deficits or vertebral tenderness to palpation. No chest/abdominal tenderness noted. Seems to have isolated injury to the left hip, plan for x-ray, EKG, and basic labs.  Morphine ordered for pain and Zofran for any subsequent nausea.  Additional history obtained:  Additional history obtained from EMS to triage. Previous records obtained and reviewed- surgical intervention to L hip was by Dr. Doreatha Martin in 2020.   EKG: LBBB Lab Tests:  I Ordered, reviewed, and interpreted labs, which included:  CBC: Mild leukocytosis. No anemia BMP: Mild elevation in creatinine compared to prior. No significant electrolyte derangement. Covid screening: Pending PT/INR: Within normal limits  Imaging Studies ordered:  I ordered imaging studies which included L hip/pelvis x-ray, I independently visualized and interpreted imaging which showed 1. Suspect nondisplaced fracture involving the left inferior pubic rami.  Following morphine administration Per nursing staff patient desaturated into the low 80s, placed on 2 L via nasal cannula. She remains fairly uncomfortable, she does not think she is going to be able to walk or feel safe to go home, will plan to admit for pain control and likely PT evaluation. I discussed with Hilbert Odor, PA-C with orthopedics, confirms no surgical  intervention, appreciate consultation.   14:20: CONSULT: Discussed with internal medicine residency service- accepts admission.   Findings and plan of care discussed with supervising physician Dr. Sedonia Small who is in agreement.   Portions of this note were generated with [employer]. Dictation errors may occur despite best attempts at proofreading.  Final Clinical Impression(s) / ED Diagnoses Final diagnoses:  Fall, initial encounter  Closed fracture of left inferior pubic ramus, initial encounter [facility])    Rx / DC Orders ED Discharge Orders    None       Amaryllis Dyke, PA-C 05/10/20 1423    Maudie Flakes, MD 05/10/20 1743

## 2020-05-11 DIAGNOSIS — S32592S Other specified fracture of left pubis, sequela: Secondary | ICD-10-CM

## 2020-05-11 DIAGNOSIS — E46 Unspecified protein-calorie malnutrition: Secondary | ICD-10-CM | POA: Diagnosis present

## 2020-05-11 DIAGNOSIS — W19XXXA Unspecified fall, initial encounter: Secondary | ICD-10-CM | POA: Diagnosis not present

## 2020-05-11 DIAGNOSIS — Z91041 Radiographic dye allergy status: Secondary | ICD-10-CM | POA: Diagnosis not present

## 2020-05-11 DIAGNOSIS — K219 Gastro-esophageal reflux disease without esophagitis: Secondary | ICD-10-CM | POA: Diagnosis present

## 2020-05-11 DIAGNOSIS — S72009A Fracture of unspecified part of neck of unspecified femur, initial encounter for closed fracture: Secondary | ICD-10-CM | POA: Diagnosis present

## 2020-05-11 DIAGNOSIS — M2550 Pain in unspecified joint: Secondary | ICD-10-CM | POA: Diagnosis not present

## 2020-05-11 DIAGNOSIS — I10 Essential (primary) hypertension: Secondary | ICD-10-CM | POA: Diagnosis not present

## 2020-05-11 DIAGNOSIS — E872 Acidosis, unspecified: Secondary | ICD-10-CM

## 2020-05-11 DIAGNOSIS — Z888 Allergy status to other drugs, medicaments and biological substances status: Secondary | ICD-10-CM | POA: Diagnosis not present

## 2020-05-11 DIAGNOSIS — S72002D Fracture of unspecified part of neck of left femur, subsequent encounter for closed fracture with routine healing: Secondary | ICD-10-CM | POA: Diagnosis not present

## 2020-05-11 DIAGNOSIS — R2681 Unsteadiness on feet: Secondary | ICD-10-CM | POA: Diagnosis not present

## 2020-05-11 DIAGNOSIS — Z20822 Contact with and (suspected) exposure to covid-19: Secondary | ICD-10-CM | POA: Diagnosis present

## 2020-05-11 DIAGNOSIS — W1830XA Fall on same level, unspecified, initial encounter: Secondary | ICD-10-CM | POA: Diagnosis present

## 2020-05-11 DIAGNOSIS — R5381 Other malaise: Secondary | ICD-10-CM | POA: Diagnosis not present

## 2020-05-11 DIAGNOSIS — Z7982 Long term (current) use of aspirin: Secondary | ICD-10-CM | POA: Diagnosis not present

## 2020-05-11 DIAGNOSIS — I340 Nonrheumatic mitral (valve) insufficiency: Secondary | ICD-10-CM | POA: Diagnosis present

## 2020-05-11 DIAGNOSIS — D649 Anemia, unspecified: Secondary | ICD-10-CM | POA: Diagnosis not present

## 2020-05-11 DIAGNOSIS — Z8249 Family history of ischemic heart disease and other diseases of the circulatory system: Secondary | ICD-10-CM | POA: Diagnosis not present

## 2020-05-11 DIAGNOSIS — S72002A Fracture of unspecified part of neck of left femur, initial encounter for closed fracture: Secondary | ICD-10-CM | POA: Diagnosis present

## 2020-05-11 DIAGNOSIS — Z885 Allergy status to narcotic agent status: Secondary | ICD-10-CM | POA: Diagnosis not present

## 2020-05-11 DIAGNOSIS — M84350A Stress fracture, pelvis, initial encounter for fracture: Secondary | ICD-10-CM | POA: Diagnosis present

## 2020-05-11 DIAGNOSIS — R627 Adult failure to thrive: Secondary | ICD-10-CM | POA: Diagnosis present

## 2020-05-11 DIAGNOSIS — J449 Chronic obstructive pulmonary disease, unspecified: Secondary | ICD-10-CM | POA: Diagnosis present

## 2020-05-11 DIAGNOSIS — I119 Hypertensive heart disease without heart failure: Secondary | ICD-10-CM | POA: Diagnosis present

## 2020-05-11 DIAGNOSIS — Z825 Family history of asthma and other chronic lower respiratory diseases: Secondary | ICD-10-CM | POA: Diagnosis not present

## 2020-05-11 DIAGNOSIS — M6281 Muscle weakness (generalized): Secondary | ICD-10-CM | POA: Diagnosis not present

## 2020-05-11 DIAGNOSIS — R1312 Dysphagia, oropharyngeal phase: Secondary | ICD-10-CM | POA: Diagnosis not present

## 2020-05-11 DIAGNOSIS — S32592A Other specified fracture of left pubis, initial encounter for closed fracture: Secondary | ICD-10-CM

## 2020-05-11 DIAGNOSIS — R29898 Other symptoms and signs involving the musculoskeletal system: Secondary | ICD-10-CM | POA: Diagnosis not present

## 2020-05-11 DIAGNOSIS — K5900 Constipation, unspecified: Secondary | ICD-10-CM | POA: Diagnosis not present

## 2020-05-11 DIAGNOSIS — Z79899 Other long term (current) drug therapy: Secondary | ICD-10-CM | POA: Diagnosis not present

## 2020-05-11 DIAGNOSIS — D509 Iron deficiency anemia, unspecified: Secondary | ICD-10-CM | POA: Diagnosis present

## 2020-05-11 DIAGNOSIS — E785 Hyperlipidemia, unspecified: Secondary | ICD-10-CM | POA: Diagnosis present

## 2020-05-11 DIAGNOSIS — E7800 Pure hypercholesterolemia, unspecified: Secondary | ICD-10-CM | POA: Diagnosis present

## 2020-05-11 DIAGNOSIS — R42 Dizziness and giddiness: Secondary | ICD-10-CM | POA: Diagnosis not present

## 2020-05-11 DIAGNOSIS — I447 Left bundle-branch block, unspecified: Secondary | ICD-10-CM | POA: Diagnosis present

## 2020-05-11 DIAGNOSIS — M810 Age-related osteoporosis without current pathological fracture: Secondary | ICD-10-CM | POA: Diagnosis present

## 2020-05-11 DIAGNOSIS — Z7401 Bed confinement status: Secondary | ICD-10-CM | POA: Diagnosis not present

## 2020-05-11 DIAGNOSIS — N179 Acute kidney failure, unspecified: Secondary | ICD-10-CM | POA: Diagnosis not present

## 2020-05-11 DIAGNOSIS — R52 Pain, unspecified: Secondary | ICD-10-CM | POA: Diagnosis not present

## 2020-05-11 DIAGNOSIS — S32592D Other specified fracture of left pubis, subsequent encounter for fracture with routine healing: Secondary | ICD-10-CM | POA: Diagnosis not present

## 2020-05-11 DIAGNOSIS — H5410 Blindness, one eye, low vision other eye, unspecified eyes: Secondary | ICD-10-CM | POA: Diagnosis not present

## 2020-05-11 DIAGNOSIS — R7303 Prediabetes: Secondary | ICD-10-CM | POA: Diagnosis not present

## 2020-05-11 DIAGNOSIS — R011 Cardiac murmur, unspecified: Secondary | ICD-10-CM | POA: Diagnosis present

## 2020-05-11 DIAGNOSIS — R339 Retention of urine, unspecified: Secondary | ICD-10-CM | POA: Diagnosis not present

## 2020-05-11 DIAGNOSIS — Z6821 Body mass index (BMI) 21.0-21.9, adult: Secondary | ICD-10-CM | POA: Diagnosis not present

## 2020-05-11 LAB — BASIC METABOLIC PANEL
Anion gap: 11 (ref 5–15)
BUN: 19 mg/dL (ref 8–23)
CO2: 17 mmol/L — ABNORMAL LOW (ref 22–32)
Calcium: 8.2 mg/dL — ABNORMAL LOW (ref 8.9–10.3)
Chloride: 108 mmol/L (ref 98–111)
Creatinine, Ser: 1.07 mg/dL — ABNORMAL HIGH (ref 0.44–1.00)
GFR calc Af Amer: 53 mL/min — ABNORMAL LOW (ref >60)
GFR calc non Af Amer: 46 mL/min — ABNORMAL LOW (ref >60)
Glucose, Bld: 120 mg/dL — ABNORMAL HIGH (ref 70–99)
Potassium: 4.1 mmol/L (ref 3.5–5.1)
Sodium: 136 mmol/L (ref 135–145)

## 2020-05-11 LAB — URINALYSIS, ROUTINE W REFLEX MICROSCOPIC
Bilirubin Urine: NEGATIVE
Glucose, UA: NEGATIVE mg/dL
Hgb urine dipstick: NEGATIVE
Ketones, ur: NEGATIVE mg/dL
Leukocytes,Ua: NEGATIVE
Nitrite: NEGATIVE
Protein, ur: NEGATIVE mg/dL
Specific Gravity, Urine: 1.019 (ref 1.005–1.030)
pH: 5 (ref 5.0–8.0)

## 2020-05-11 LAB — CBC
HCT: 39.4 % (ref 36.0–46.0)
Hemoglobin: 12.7 g/dL (ref 12.0–15.0)
MCH: 28.6 pg (ref 26.0–34.0)
MCHC: 32.2 g/dL (ref 30.0–36.0)
MCV: 88.7 fL (ref 80.0–100.0)
Platelets: 177 10*3/uL (ref 150–400)
RBC: 4.44 MIL/uL (ref 3.87–5.11)
RDW: 15 % (ref 11.5–15.5)
WBC: 13 10*3/uL — ABNORMAL HIGH (ref 4.0–10.5)
nRBC: 0 % (ref 0.0–0.2)

## 2020-05-11 LAB — BLOOD GAS, VENOUS
Acid-base deficit: 5.5 mmol/L — ABNORMAL HIGH (ref 0.0–2.0)
Bicarbonate: 18.6 mmol/L — ABNORMAL LOW (ref 20.0–28.0)
Drawn by: 4653
FIO2: 28
O2 Saturation: 93 %
Patient temperature: 37
pCO2, Ven: 31.3 mmHg — ABNORMAL LOW (ref 44.0–60.0)
pH, Ven: 7.391 (ref 7.250–7.430)
pO2, Ven: 65.6 mmHg — ABNORMAL HIGH (ref 32.0–45.0)

## 2020-05-11 LAB — HEPATIC FUNCTION PANEL
ALT: 11 U/L (ref 0–44)
AST: 14 U/L — ABNORMAL LOW (ref 15–41)
Albumin: 2.5 g/dL — ABNORMAL LOW (ref 3.5–5.0)
Alkaline Phosphatase: 71 U/L (ref 38–126)
Bilirubin, Direct: 0.1 mg/dL (ref 0.0–0.2)
Indirect Bilirubin: 0.8 mg/dL (ref 0.3–0.9)
Total Bilirubin: 0.9 mg/dL (ref 0.3–1.2)
Total Protein: 5.2 g/dL — ABNORMAL LOW (ref 6.5–8.1)

## 2020-05-11 MED ORDER — POLYETHYLENE GLYCOL 3350 17 G PO PACK
17.0000 g | PACK | Freq: Two times a day (BID) | ORAL | Status: DC
  Administered 2020-05-11 – 2020-05-16 (×5): 17 g via ORAL
  Filled 2020-05-11 (×11): qty 1

## 2020-05-11 NOTE — Progress Notes (Signed)
  Date: 05/11/2020  Patient name: Tina Mercado  Medical record number: [id number]  Date of birth: Dec 12, 1928   I have seen and evaluated [name] and discussed their care with the Residency Team. Ms. Sommerfield came in after a fall with a pelvic ramus fracture.  She was sleepy and groggy this morning after receiving medication last night.  She was able to tell us that her pain was okay, except when she tried to walk.  Will ask orthopedics to comment on weight bearing status.   Vitals:   05/11/20 0818 05/11/20 0943  BP: (!) 120/47   Pulse: 92   Resp: 17   Temp: 99 F (37.2 C)   SpO2: 92% 92%   General: Elderly woman, NAD, groggy Eyes: Anicteric sclerae CV: RR, NR, + 2/6 murmur best heard at the apex, no radiation, no peripheral edema but with significant varicose veins and some tenderness related on palpation.  She had intact pulses to the DP and radial arteries Pulm: Breathing comfortably, no wheezing Abd: Soft, NT MSK: She had no abnormal rotation of the hip.  She was able to move her lower extremities without pain or issue  Assessment and Plan: I have seen and evaluated the patient as outlined above. I agree with the formulated Assessment and Plan as detailed in the residents' note, with the following changes:   1. Left non displaced pelvic rami fracture - Follow up orthopedic recommendations - PT evaluation - Pain and nausea control  2. Leukocytosis - Likely reactive - Monitor and trend, no other sign or symptoms of infection at this time  Chronic medical issues --> continue home meds, titrate medications as indicated.  Other issues per Resident Team daily note.    Sid Falcon, MD 7/16/20211:25 PM

## 2020-05-11 NOTE — Progress Notes (Signed)
   Subjective:   Patient is seen at bedside today. She is somnolent but able to respond to verbal stimulation. Patient reports sleeping well. She states the pain is well controlled Discussed with patient about pain management and PT. Patient voices understanding.  Also spoke with caretaker, Briscoe Deutscher. Answered questions about her care.  Objective:  Vital signs in last 24 hours: Vitals:   05/10/20 2017 05/10/20 2217 05/11/20 0017 05/11/20 0417  BP: (!) 103/44 (!) 100/53 (!) 120/58 (!) 118/57  Pulse:  98 99 98  Resp: 16 15 16 15   Temp: 98.6 F (37 C) 97.6 F (36.4 C) 98 F (36.7 C) 98.3 F (36.8 C)  TempSrc:  Oral Oral Oral  SpO2: 99% 97% 98% 98%    Physical Exam Constitutional: no acute distress, somnolent Head: atraumatic ENT: external ears normal, hard of hearing  Eyes: EOMI Cardiovascular: regular rate and rhythm, 2/6 systolic murmur best heard at cardiac apex Pulmonary: effort normal, lungs clear to ascultation bilaterally Abdominal: flat, nontender, no rebound tenderness, bowel sounds normal Musculoskeletal: L leg appears shortened compared to R, distal foot is neurovascularly intact, extremities warm Skin: warm and dry, varicose veins noted to bilateral lower extremities Neurological: alert, no focal deficit Psychiatric: normal mood and affect  Assessment/Plan: Tina Mercado is a 86 y.o. female with hx of osteoporosis, left femur fracture s/p intramedullary nail in 2020, COPD 2/2 environmental exposure, HTN, HLD, hearing difficulty presenting with L inferior pubic rami fracture. Pain controlled with medications.  Principal Problem:   Nondisplaced fracture of left inferior pubic rami Active Problems:   Pure hypercholesterolemia   Osteoporosis   COPD mixed type ([place])   Hypertension   Adult failure to thrive syndrome  #Left pubic rami fracture following mechanical fall #Stress fracture  Patient denies any SOB, CP, loss of consciousness, dizziness during  the episode. States that she was ambulating around her house without her Rolator. Hip Xray with evidence of nondisplaced fracture of L interior pubic ramus. Prior L intertrochanteric fracture last May s/p surgery. Per ortho, surgical intervention is not indicated. -continue tylenol 650mg  q6 prn -continue oxycodone 5mg  q4 prn -per ortho, weightbearing as tolerated -PT -f/u with ortho in 3 weeks with Dr. Doreatha Martin  #Anion-gap metabolic acidosis Bicarb noted to be 17 today, her typical level since 09/2018 seems to be 18-22. Alb 2.5. Corrected AG of 14.8. VBG with pH 7.391, pCO2 31.3. Anion gap metabolic acidosis with appropriate respiratory compensation and additional non-gap metabolic acidosis. -f/u BMP tomorrow  #COPD due to environmental exposure  Denies SOB at this time. Has had cough slightly worse than normal, not productive. On 2L nasal canula -Dulera BID -Duo-neb prn   #HLD -continue crestor 10mg  daily  #1/8 systolic heart murmur best heard at apex Echo from 01/2020 with mild mitral regurgitation. Followed by cardiology  #HTN Reports hx of HTN after the death of her husband, but resolved years ago and has not had any HTN medications recently. Reports BP 110-120s/70-80s at home.   Diet:  IVF:  VTE:  Prior to Admission Living Arrangement:  Anticipated Discharge Location:  Barriers to Discharge:  Dispo: Anticipated discharge in approximately 2-3 day(s).   Andrew Au, MD 05/11/2020, 7:15 AM Pager: [phone] After 5pm on weekdays and 1pm on weekends: On Call pager [phone]
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
## 2020-05-11 NOTE — Progress Notes (Signed)
Dr. Daryll Drown notified patient did not urinate today, bladder scan completed and in and out cath done, UA sent to lab as ordered.

## 2020-05-11 NOTE — Progress Notes (Addendum)
   05/10/20 2012  Assess: MEWS Score  ECG Heart Rate (!) 111  Assess: MEWS Score  MEWS Temp 0  MEWS Systolic 0  MEWS Pulse 2  MEWS RR 0  MEWS LOC 0  MEWS Score 2  MEWS Score Color Yellow  Assess: if the MEWS score is Yellow or Red  Were vital signs taken at a resting state? Yes  Focused Assessment Documented focused assessment  MEWS guidelines implemented *See Row Information* No, vital signs rechecked  Treat  MEWS Interventions Other (Comment) (no need to escalate)  Take Vital Signs  Increase Vital Sign Frequency  Yellow: Q 2hr X 2 then Q 4hr X 2, if remains yellow, continue Q 4hrs (Simultaneous filing. User may not have seen previous data.)  Escalate  MEWS: Escalate  (no need to escalate)  Notify: Charge Nurse/RN  Name of Charge Nurse/RN Notified Blanch Media RN  Date Charge Nurse/RN Notified 05/10/20  Time Charge Nurse/RN Notified 2017  Notify: Provider  Provider Name/Title [place] MD  Date Provider Notified 05/11/20  Time Provider Notified 2030  Notification Type Page  Notification Reason Change in status  Notify: Rapid Response  Name of Rapid Response RN Notified  (N/A)  Document  Patient Outcome Stabilized after interventions  Progress note created (see row info) Yes   HR of 111 documented by telemetry. VS rechecked. VSS. Pt alert and oriented.   VS q2 x2 & q4 x2 implemented

## 2020-05-11 NOTE — Progress Notes (Signed)
PT Cancellation Note  Patient Details Name: Tina Mercado MRN: [id number] DOB: 22-Nov-1928   Cancelled Treatment:    Reason Eval/Treat Not Completed: Active bedrest order     Wyona Almas, PT, DPT Acute Rehabilitation Services Pager [phone] Office [phone]    Deno Etienne 05/11/2020, 7:50 AM

## 2020-05-11 NOTE — Evaluation (Signed)
Physical Therapy Evaluation Patient Details Name: Tina Mercado MRN: [id number] DOB: [birth year] Today's Date: 05/11/2020   History of Present Illness  Pt is a 86 y.o. F with significant PMH of COPD, hearing loss, glaucoma, osteoporosis, left IMN (2020) who presents after a fall with a left inferior pubic ramus fracture.   Clinical Impression  Per chart review, prior to admission pt lives at [facility], is independent with mobility using a Rollator, and independent with ADL's. Limited evaluation due to pt lethargy and pain. Pt oriented to self only. Requiring maximal assist for bed mobility. Able to participate in therapeutic exercises on edge of bed. Transfer to chair deferred due to decreased level of arousal. Desaturation to 81% on RA, returned to 2L O2 where she rebounded > 90%. HR stable. Recommend SNF to maximize functional mobility upon discharge.     Follow Up Recommendations SNF    Equipment Recommendations  None recommended by PT    Recommendations for Other Services       Precautions / Restrictions Precautions Precautions: Fall Restrictions Weight Bearing Restrictions: Yes RLE Weight Bearing: Weight bearing as tolerated LLE Weight Bearing: Weight bearing as tolerated      Mobility  Bed Mobility Overal bed mobility: Needs Assistance Bed Mobility: Supine to Sit;Sit to Supine     Supine to sit: Max assist Sit to supine: Max assist   General bed mobility comments: MaxA for transition to supine <> sit with assist for trunk and LLE support  Transfers                 General transfer comment: deferred due to level of arousal  Ambulation/Gait                Stairs            Wheelchair Mobility    Modified Rankin (Stroke Patients Only)       Balance Overall balance assessment: Needs assistance Sitting-balance support: Feet supported Sitting balance-Leahy Scale: Poor Sitting balance - Comments: reliant on BUE support, progressing  to supervision                                      Pertinent Vitals/Pain Pain Assessment: Faces Faces Pain Scale: Hurts whole lot Pain Location: L hip Pain Descriptors / Indicators: Grimacing;Guarding;Moaning Pain Intervention(s): Limited activity within patient's tolerance;Monitored during session;Repositioned    Home Living Family/patient expects to be discharged to:: Private residence Living Arrangements: Alone Available Help at Discharge: Family Type of Home: Apartment         Home Equipment: Environmental consultant - 4 wheels Additional Comments: LIves at [employer], pt unable to provide further infromation    Prior Function Level of Independence: Independent with assistive device(s)         Comments: Using Rollator, independent with ADL's per chart review     Hand Dominance        Extremity/Trunk Assessment   Upper Extremity Assessment Upper Extremity Assessment: Generalized weakness    Lower Extremity Assessment Lower Extremity Assessment: Generalized weakness    Cervical / Trunk Assessment Cervical / Trunk Assessment: Kyphotic  Communication   Communication: HOH  Cognition Arousal/Alertness: Lethargic Behavior During Therapy: WFL for tasks assessed/performed Overall Cognitive Status: Impaired/Different from baseline Area of Impairment: Orientation;Following commands;Problem solving;Awareness                 Orientation Level: Disoriented to;Place;Time;Situation     Following  Commands: Follows one step commands inconsistently;Follows one step commands with increased time   Awareness: Intellectual Problem Solving: Slow processing;Difficulty sequencing;Requires verbal cues;Requires tactile cues General Comments: Pt drowsy, not oriented, increased time to follow 1 step commands and requires repetition.       General Comments      Exercises General Exercises - Lower Extremity Long Arc Quad: Both;10 reps;Seated   Assessment/Plan     PT Assessment Patient needs continued PT services  PT Problem List Decreased strength;Decreased range of motion;Decreased activity tolerance;Decreased balance;Decreased mobility;Decreased cognition;Decreased safety awareness;Pain       PT Treatment Interventions DME instruction;Gait training;Functional mobility training;Therapeutic activities;Therapeutic exercise;Balance training;Patient/family education    PT Goals (Current goals can be found in the Care Plan section)  Acute Rehab PT Goals Patient Stated Goal: pt did not state PT Goal Formulation: With patient Time For Goal Achievement: 05/25/20 Potential to Achieve Goals: Fair    Frequency Min 3X/week   Barriers to discharge        Co-evaluation               AM-PAC PT "6 Clicks" Mobility  Outcome Measure Help needed turning from your back to your side while in a flat bed without using bedrails?: Total Help needed moving from lying on your back to sitting on the side of a flat bed without using bedrails?: Total Help needed moving to and from a bed to a chair (including a wheelchair)?: Total Help needed standing up from a chair using your arms (e.g., wheelchair or bedside chair)?: Total Help needed to walk in hospital room?: Total Help needed climbing 3-5 steps with a railing? : Total 6 Click Score: 6    End of Session Equipment Utilized During Treatment: Oxygen Activity Tolerance: Patient limited by lethargy Patient left: in bed;with call bell/phone within reach;with bed alarm set Nurse Communication: Mobility status PT Visit Diagnosis: Pain;Other abnormalities of gait and mobility (R26.89);Muscle weakness (generalized) (M62.81) Pain - Right/Left: Left Pain - part of body: Hip    Time: 1250-1316 PT Time Calculation (min) (ACUTE ONLY): 26 min   Charges:   PT Evaluation $PT Eval Moderate Complexity: 1 Mod PT Treatments $Therapeutic Activity: 8-22 mins          Wyona Almas, PT, DPT Acute  Rehabilitation Services Pager [phone] Office [phone]   Deno Etienne 05/11/2020, 5:10 PM

## 2020-05-12 LAB — BASIC METABOLIC PANEL
Anion gap: 10 (ref 5–15)
BUN: 17 mg/dL (ref 8–23)
CO2: 19 mmol/L — ABNORMAL LOW (ref 22–32)
Calcium: 8.3 mg/dL — ABNORMAL LOW (ref 8.9–10.3)
Chloride: 109 mmol/L (ref 98–111)
Creatinine, Ser: 1.11 mg/dL — ABNORMAL HIGH (ref 0.44–1.00)
GFR calc Af Amer: 51 mL/min — ABNORMAL LOW (ref >60)
GFR calc non Af Amer: 44 mL/min — ABNORMAL LOW (ref >60)
Glucose, Bld: 127 mg/dL — ABNORMAL HIGH (ref 70–99)
Potassium: 3.9 mmol/L (ref 3.5–5.1)
Sodium: 138 mmol/L (ref 135–145)

## 2020-05-12 LAB — VITAMIN D 25 HYDROXY (VIT D DEFICIENCY, FRACTURES): Vit D, 25-Hydroxy: 24.74 ng/mL — ABNORMAL LOW (ref 30–100)

## 2020-05-12 LAB — CREATININE, URINE, RANDOM: Creatinine, Urine: 136.47 mg/dL

## 2020-05-12 LAB — SODIUM, URINE, RANDOM: Sodium, Ur: 107 mmol/L

## 2020-05-12 MED ORDER — VITAMIN D 25 MCG (1000 UNIT) PO TABS
2000.0000 [IU] | ORAL_TABLET | Freq: Every day | ORAL | Status: DC
  Administered 2020-05-12 – 2020-05-16 (×5): 2000 [IU] via ORAL
  Filled 2020-05-12 (×5): qty 2

## 2020-05-12 MED ORDER — LACTATED RINGERS IV SOLN: INTRAVENOUS | Status: DC

## 2020-05-12 MED ORDER — ENSURE MAX PROTEIN PO LIQD
11.0000 [oz_av] | Freq: Every day | ORAL | Status: DC
  Filled 2020-05-12 (×2): qty 330

## 2020-05-12 NOTE — TOC Initial Note (Signed)
Transition of Care [facility]) - Initial/Assessment Note    Patient Details  Name: Tina Mercado MRN: [id number] Date of Birth: May 12, 1929  Transition [facility]) CM/SW Contact:    Emeterio Reeve, [place] Phone Number: 05/12/2020, 12:59 PM  Clinical Narrative:                  CSW met with pt at bedside. CSW introduced self and explained her role at the hospital.  PT stated PTA she was independent living at [facility] in [place]. Pt states she was independent and able to provide all of her needs.   CSW reviewed pt/ot recc of SNF for short term rehab. Pt asked csw to explain multiple times but was having difficulty understanding what csw was saying. CSW inquired if she can call pts daughter [name] patient states "No, but you can call [name]".   CSW called [name] and explained reccs. [name] states that she is the main contact not pts daughter per her request. [name] states she feels like pt will want to have sn[facility] if [facility]. [name] states she will continue to tallk to her about snf so she understands the plan.   CSW will continue to follow.   Expected Discharge Plan: Skilled Nursing Facility Barriers to Discharge: Continued Medical Work up   Patient Goals and CMS Choice Patient states their goals for this hospitalization and ongoing recovery are:: To return home CMS Medicare.gov Compare Post Acute Care list provided to:: Patient Choice offered to / list presented to : Patient  Expected Discharge Plan and Services Expected Discharge Plan: [place] arrangements for the past 2 months: [place]                                      Prior Living Arrangements/Services Living arrangements for the past 2 months: [place] Lives with:: Self Patient language and need for interpreter reviewed:: Yes Do you feel safe going back to the place where you live?: Yes      Need for Family Participation in Patient  Care: Yes (Comment) Care giver support system in place?: Yes (comment) Current home services: DME Criminal Activity/Legal Involvement Pertinent to Current Situation/Hospitalization: No - Comment as needed  Activities of Daily Living      Permission Sought/Granted Permission sought to share information with : Family Supports, Chartered certified accountant granted to share information with : Yes, Verbal Permission Granted     Permission granted to share info w AGENCY: SNF        Emotional Assessment Appearance:: Appears stated age Attitude/Demeanor/Rapport: Engaged Affect (typically observed): Appropriate, Pleasant Orientation: : Oriented to Self, Oriented to Place, Oriented to  Time, Oriented to Situation Alcohol / Substance Use: Not Applicable Psych Involvement: No (comment)  Admission diagnosis:  Hip fracture, left ([place]) [S72.002A] Fall, initial encounter [W19.XXXA] Closed fracture of left inferior pubic ramus, initial encounter ([place]) [S32.592A] Hip fracture ([place]) [S72.009A] Patient Active Problem List   Diagnosis Date Noted  . NAGMA 05/11/2020  . Hip fracture ([place]) 05/11/2020  . Closed fracture of left inferior pubic ramus ([place])   . Fall   . Nondisplaced fracture of left inferior pubic rami 05/10/2020  . Suspected COVID-19 virus infection 04/19/2019  . Adult failure to thrive syndrome 04/18/2019  . Acute respiratory failure with hypoxia ([place]) 04/16/2019  . HCAP (healthcare-associated pneumonia) 04/16/2019  .  Anorexia 04/16/2019  . Hypertension   . Hyponatremia 04/10/2019  . Generalized weakness 04/10/2019  . Hypoxemia 04/10/2019  . Prediabetes 09/28/2017  . Dizziness 09/15/2017  . Neuropathy 07/16/2017  . COPD mixed type ([place]) 04/10/2015  . Laryngopharyngeal reflux (LPR) 04/10/2015  . Chronic laryngitis 08/17/2014  . Blindness and low vision 04/13/2013  . Osteoporosis 12/23/2012  . Bronchitis 10/30/2011  . Benign hypertensive heart disease without  heart failure 08/05/2011  . Pure hypercholesterolemia 08/05/2011  . Mitral regurgitation   . History of anemia   . History of diastolic dysfunction   . GERD (gastroesophageal reflux disease)   . History of seasonal allergies   . Large hiatal hernia 11/23/2004   PCP:  Shawnee Knapp, MD Pharmacy:   [id number]: [phone]: [phone]    Social Determinants of Health (SDOH) Interventions    Readmission Risk Interventions Readmission Risk Prevention Plan 03/04/2019  Post Dischage Appt Not Complete  Medication Screening Complete  Transportation Screening Complete  Some recent data might be hidden   MEmeterio Reeve LLatanya Presser LHooperSocial Worker [phone]

## 2020-05-12 NOTE — NC FL2 (Signed)
[facility] LEVEL OF CARE SCREENING TOOL     IDENTIFICATION  Patient Name: Tina Mercado Birthdate: 11/17/1928 Sex: female Admission Date (Current Location): 05/10/2020  [facility] and [place] Number:  Herbalist and Address:  [facility] [place]. [facility], [place] [address] [place], [place] [address]      Provider Number: [id number]  Attending Physician Name and Address:  Sid Falcon, MD  Relative Name and Phone Number:       Current Level of Care: Hospital Recommended Level of Care: [place] Prior Approval Number:    Date Approved/Denied:   PASRR Number: [id number]  Discharge Plan: Home    Current Diagnoses: Patient Active Problem List   Diagnosis Date Noted   NAGMA 05/11/2020   Hip fracture ([place]) 05/11/2020   Closed fracture of left inferior pubic ramus ([place])    Fall    Nondisplaced fracture of left inferior pubic rami 05/10/2020   Suspected COVID-19 virus infection 04/19/2019   Adult failure to thrive syndrome 04/18/2019   Acute respiratory failure with hypoxia ([place]) 04/16/2019   HCAP (healthcare-associated pneumonia) 04/16/2019   Anorexia 04/16/2019   Hypertension    Hyponatremia 04/10/2019   Generalized weakness 04/10/2019   Hypoxemia 04/10/2019   Prediabetes 09/28/2017   Dizziness 09/15/2017   Neuropathy 07/16/2017   COPD mixed type ([place]) 04/10/2015   Laryngopharyngeal reflux (LPR) 04/10/2015   Chronic laryngitis 08/17/2014   Blindness and low vision 04/13/2013   Osteoporosis 12/23/2012   Bronchitis 10/30/2011   Benign hypertensive heart disease without heart failure 08/05/2011   Pure hypercholesterolemia 08/05/2011   Mitral regurgitation    History of anemia    History of diastolic dysfunction    GERD (gastroesophageal reflux disease)    History of seasonal allergies    Large hiatal hernia 11/23/2004    Orientation RESPIRATION BLADDER Height & Weight     Self,  Situation, Time, Place  Normal, O2   Weight:   Height:     BEHAVIORAL SYMPTOMS/MOOD NEUROLOGICAL BOWEL NUTRITION STATUS      Continent Diet (See discharge  summary)  AMBULATORY STATUS COMMUNICATION OF NEEDS Skin   Extensive Assist Verbally Normal                       Personal Care Assistance Level of Assistance  Bathing, Feeding, Dressing Bathing Assistance: Maximum assistance Feeding assistance: Independent Dressing Assistance: Maximum assistance     Functional Limitations Info  Hearing, Speech, Sight Sight Info: Adequate Hearing Info: Adequate      SPECIAL CARE FACTORS FREQUENCY  PT (By licensed PT), OT (By licensed OT)     PT Frequency: 5x a week OT Frequency: 5x a week            Contractures Contractures Info: Not present    Additional Factors Info  Code Status, Allergies Code Status Info: Full Allergies Info: Contrast Media Antihistamines, Diphenhydramine-type Celebrex Celebrex  Darvocet  Demerol Diphenhydramine Hcl Feldene Gabapentin Meperidine Propoxyphene Iodine-131           Current Medications (05/12/2020):  This is the current hospital active medication list Current Facility-Administered Medications  Medication Dose Route Frequency Provider Last Rate Last Admin   acetaminophen (TYLENOL) tablet 650 mg  650 mg Oral Q6H PRN Jean Rosenthal, MD   650 mg at 05/11/20 2058   aspirin EC tablet 81 mg  81 mg Oral QHS Jean Rosenthal, MD   81 mg at 05/11/20 2058   cholecalciferol (  VITAMIN D3) tablet 2,000 Units  2,000 Units Oral Daily Agyei, Obed K, MD       heparin injection 5,000 Units  5,000 Units Subcutaneous Q8H Agyei, Obed K, MD   5,000 Units at 05/12/20 0558   ipratropium-albuterol (DUONEB) 0.5-2.5 (3) MG/3ML nebulizer solution 3 mL  3 mL Nebulization Q6H PRN Agyei, Obed K, MD       lactated ringers infusion   Intravenous Continuous Jean Rosenthal, MD 75 mL/hr at 05/12/20 1149 New Bag at 05/12/20 1149   mometasone-formoterol (DULERA) 200-5 MCG/ACT  inhaler 2 puff  2 puff Inhalation BID Jean Rosenthal, MD   2 puff at 05/12/20 0837   oxyCODONE (Oxy IR/ROXICODONE) immediate release tablet 5 mg  5 mg Oral Q4H PRN Agyei, Obed K, MD   5 mg at 05/12/20 0840   polyethylene glycol (MIRALAX / GLYCOLAX) packet 17 g  17 g Oral BID Gaylan Gerold, DO   17 g at 05/11/20 2058   protein supplement (ENSURE MAX) liquid  11 oz Oral Daily Agyei, Obed K, MD       rosuvastatin (CRESTOR) tablet 10 mg  10 mg Oral Daily Agyei, Obed K, MD   10 mg at 05/12/20 1009     Discharge Medications: Please see discharge summary for a list of discharge medications.  Relevant Imaging Results:  Relevant Lab Results:   Additional Information [id number] [place], [place]

## 2020-05-12 NOTE — Progress Notes (Addendum)
Subjective: HD#1   Overnight: No acute overnight events.  Today, Tina Mercado complain of hip pain. Pain is worse with turning and states that pain is very severe. She denies pain down her legs but complains of neruopathy. She states that she was taking 10 mg of Amytriptyline, which we will continue to hold due to her    Objective:  Vital signs in last 24 hours: Vitals:   05/11/20 0943 05/11/20 1504 05/11/20 2018 05/12/20 0443  BP:  (!) 136/51 (!) 142/58 (!) 174/74  Pulse:  93 95 (!) 105  Resp:  15 16 14   Temp:  98.4 F (36.9 C) 98.2 F (36.8 C) 98.4 F (36.9 C)  TempSrc:  Oral Oral Oral  SpO2: 92% 93% 97% 94%   Const: In no apparent distress, lying comfortably in bed, conversational Ext: Skin is warm to touch, left leg shorter than right.   Assessment/Plan:  Principal Problem:   Nondisplaced fracture of left inferior pubic rami Active Problems:   Pure hypercholesterolemia   Osteoporosis   COPD mixed type ([place])   Hypertension   Adult failure to thrive syndrome   NAGMA   Hip fracture (HCC)   Closed fracture of left inferior pubic ramus ([place])   Fall  Tina Mercado is a 86 y.o. female with hx of osteoporosis, left femur fracture s/p intramedullary nail in 2020, COPD 2/2 environmental exposure, HTN, HLD, hearing difficulty presenting with L inferior pubic rami fracture. Pain controlled with medications.  #Left pubic rami fracture following mechanical fall #Stress fracture She still endorses a lot of pain with the slightest movement.  Per chart review, she only received about 5 mg of her oxycodone dose in the past 24 hours.  I advised her that she can ask for pain medications as needed -continue tylenol 650mg  q6 prn -continue oxycodone 5mg  q4 prn -per ortho, weightbearing as tolerated -f/u with ortho in 3 weeks with Dr. Doreatha Martin   #Chronic Non Anion-gap metabolic acidosis-unclear etiology Her serum bicarb today is 19.  There have not been any reports of  diarrhea or GI losses.  I highly doubt this is renal tubular acidosis as her urinalysis was unremarkable.  She does have COPD for which chronic hyperventilation can cause non-anion gap metabolic acidosis also as indicated by her VBG which showed PCO2 of 31 though this could be compensation for her metabolic acidosis.  She also has protein calorie malnutrition with low albumin of 2.5 which could also be another contributing etiology. -f/u BMP tomorrow   #Urinary retention Bladder scan yesterday showed 650 cc of urine.  Subsequent bladder scans have been unremarkable -Bladder scans every 6 hours, in and out as needed   #AKI-unclear etiology  -Follow-up urine sodium, urine creatinine -LR at 75 mL/hour   #COPD due to environmental exposure -Dulera BID -Duo-neb prn    #HLD -continue crestor 10mg  daily   #5/4 systolic heart murmur best heard at apex Echo from 01/2020 with mild mitral regurgitation. Followed by cardiology   #HTN Reports hx of HTN after the death of her husband, but resolved years ago and has not had any HTN medications recently. Reports BP 110-120s/70-80s at home.   Diet: Heart healthy diet with Ensure supplementation IVF: LR at 75 mL/hour VTE: Subcutaneous heparin Prior to Admission Living Arrangement: Unknown Anticipated Discharge Location: SNF Barriers to Discharge: Pending SNF placement Dispo: Anticipated discharge in approximately 2-3 day(s).    Jean Rosenthal, MD 05/12/2020, 6:49 AM Pager: [phone] Internal Medicine Teaching Service After 5pm  on weekdays and 1pm on weekends: On Call pager: [phone]

## 2020-05-12 NOTE — Evaluation (Signed)
Occupational Therapy Evaluation Patient Details Name: Tina Mercado MRN: [id number] DOB: 06-06-1929 Today's Date: 05/12/2020    History of Present Illness Pt is a 86 y.o. F with significant PMH of COPD, hearing loss, glaucoma, osteoporosis, left IMN (2020) who presents after a fall with a left inferior pubic ramus fracture.    Clinical Impression   Pt ambulates with a RW and was independent in self care. She goes to the dining room of her ILF for 3 meals. Pt does not drive. Presents with generalized pain, decreased standing balance with inability to take a step and impaired cognition. Pt requires max assist for bed level mobility and to stand with RW. She requires min to total assist for ADL. Pt will need post acute rehab in SNF prior to return to her apartment. Will follow acutely.    Follow Up Recommendations  SNF;Supervision/Assistance - 24 hour    Equipment Recommendations  Other (comment) (defer to next venue)    Recommendations for Other Services       Precautions / Restrictions Precautions Precautions: Fall Restrictions Weight Bearing Restrictions: Yes RLE Weight Bearing: Weight bearing as tolerated LLE Weight Bearing: Weight bearing as tolerated      Mobility Bed Mobility   Bed Mobility: Supine to Sit;Sit to Supine     Supine to sit: Max assist Sit to supine: Max assist   General bed mobility comments: assist for all aspects, increased time  Transfers Overall transfer level: Needs assistance Equipment used: Rolling walker (2 wheeled) Transfers: Sit to/from Stand Sit to Stand: Max assist         General transfer comment: assist to rise and steady, pt unable to take steps    Balance Overall balance assessment: Needs assistance   Sitting balance-Leahy Scale: Fair     Standing balance support: Bilateral upper extremity supported Standing balance-Leahy Scale: Poor Standing balance comment: B UE and external assist                            ADL either performed or assessed with clinical judgement   ADL Overall ADL's : Needs assistance/impaired Eating/Feeding: Minimal assistance;Sitting   Grooming: Brushing hair;Maximal assistance;Sitting   Upper Body Bathing: Maximal assistance;Sitting   Lower Body Bathing: Total assistance;Sit to/from stand   Upper Body Dressing : Sitting;Moderate assistance   Lower Body Dressing: Total assistance;+2 for physical assistance;Sit to/from stand       Toileting- Water quality scientist and Hygiene: Total assistance;Sit to/from stand         General ADL Comments: Pt unable to lift foot to take steps.     Vision Patient Visual Report: No change from baseline       Perception     Praxis      Pertinent Vitals/Pain Pain Assessment: Faces Faces Pain Scale: Hurts even more Pain Location: generalized Pain Descriptors / Indicators: Grimacing;Guarding Pain Intervention(s): Limited activity within patient's tolerance;Monitored during session;Repositioned     Hand Dominance Right   Extremity/Trunk Assessment Upper Extremity Assessment Upper Extremity Assessment: Generalized weakness   Lower Extremity Assessment Lower Extremity Assessment: Defer to PT evaluation   Cervical / Trunk Assessment Cervical / Trunk Assessment: Kyphotic   Communication Communication Communication: HOH   Cognition Arousal/Alertness: Awake/alert Behavior During Therapy: Anxious (tearful) Overall Cognitive Status: Impaired/Different from baseline Area of Impairment: Orientation;Attention;Memory;Following commands;Safety/judgement;Awareness;Problem solving                 Orientation Level: Disoriented to;Place;Time Current Attention Level: Sustained Memory:  Decreased short-term memory Following Commands: Follows one step commands with increased time   Awareness: Intellectual Problem Solving: Slow processing;Difficulty sequencing;Requires verbal cues;Requires tactile cues General  Comments: pt not able to place a phone call    General Comments       Exercises     Shoulder Instructions      Home Living Family/patient expects to be discharged to:: Private residence Living Arrangements: Alone Available Help at Discharge: Friend(s);Available PRN/intermittently Type of Home: Apartment Home Access: Level entry     Home Layout: One level     Bathroom Shower/Tub: Occupational psychologist: Standard     Home Equipment: Environmental consultant - 4 wheels;Grab bars - toilet;Grab bars - tub/shower;Hand held shower head   Additional Comments: pt lives at [facility] in [place],  gets 3 meals a day provided by facility      Prior Functioning/Environment Level of Independence: Independent with assistive device(s)        Comments: uses rollator and is independent in self care        OT Problem List: Decreased strength;Decreased activity tolerance;Impaired balance (sitting and/or standing);Decreased cognition;Decreased knowledge of use of DME or AE;Pain      OT Treatment/Interventions: Self-care/ADL training;DME and/or AE instruction;Cognitive remediation/compensation;Patient/family education;Balance training;Therapeutic activities    OT Goals(Current goals can be found in the care plan section) Acute Rehab OT Goals Patient Stated Goal: to talk to [name] OT Goal Formulation: With patient Time For Goal Achievement: 05/26/20 Potential to Achieve Goals: Good ADL Goals Pt Will Perform Grooming: with supervision;sitting Pt Will Transfer to Toilet: with min assist;stand pivot transfer;bedside commode Pt Will Perform Toileting - Clothing Manipulation and hygiene: with supervision;sitting/lateral leans Additional ADL Goal #1: Pt will perform bed mobility with min assist in preparation for ADL. Additional ADL Goal #2: Pt will be oriented to place and time using environmental cues.  OT Frequency: Min 2X/week   Barriers to D/C:            Co-evaluation               AM-PAC OT "6 Clicks" Daily Activity     Outcome Measure Help from another person eating meals?: A Little Help from another person taking care of personal grooming?: A Lot Help from another person toileting, which includes using toliet, bedpan, or urinal?: Total Help from another person bathing (including washing, rinsing, drying)?: A Lot Help from another person to put on and taking off regular upper body clothing?: A Lot Help from another person to put on and taking off regular lower body clothing?: Total 6 Click Score: 11   End of Session Equipment Utilized During Treatment: Gait belt;Rolling walker Nurse Communication: Mobility status  Activity Tolerance: Patient limited by pain Patient left: in bed;with call bell/phone within reach;with bed alarm set  OT Visit Diagnosis: Unsteadiness on feet (R26.81);Pain;Muscle weakness (generalized) (M62.81);Other symptoms and signs involving cognitive function                Time: 8502-7741 OT Time Calculation (min): 44 min Charges:  OT General Charges $OT Visit: 1 Visit OT Evaluation $OT Eval Moderate Complexity: 1 Mod OT Treatments $Self Care/Home Management : 8-22 mins  Nestor Lewandowsky, OTR/L Acute Rehabilitation Services Pager: [phone] Office: [phone]  Malka So 05/12/2020, 3:44 PM

## 2020-05-13 LAB — CBC
HCT: 32.1 % — ABNORMAL LOW (ref 36.0–46.0)
Hemoglobin: 10.3 g/dL — ABNORMAL LOW (ref 12.0–15.0)
MCH: 28.4 pg (ref 26.0–34.0)
MCHC: 32.1 g/dL (ref 30.0–36.0)
MCV: 88.4 fL (ref 80.0–100.0)
Platelets: 120 10*3/uL — ABNORMAL LOW (ref 150–400)
RBC: 3.63 MIL/uL — ABNORMAL LOW (ref 3.87–5.11)
RDW: 14.8 % (ref 11.5–15.5)
WBC: 8.8 10*3/uL (ref 4.0–10.5)
nRBC: 0 % (ref 0.0–0.2)

## 2020-05-13 LAB — BASIC METABOLIC PANEL
Anion gap: 11 (ref 5–15)
BUN: 12 mg/dL (ref 8–23)
CO2: 19 mmol/L — ABNORMAL LOW (ref 22–32)
Calcium: 8.3 mg/dL — ABNORMAL LOW (ref 8.9–10.3)
Chloride: 109 mmol/L (ref 98–111)
Creatinine, Ser: 0.77 mg/dL (ref 0.44–1.00)
GFR calc Af Amer: >60 mL/min (ref >60)
GFR calc non Af Amer: >60 mL/min (ref >60)
Glucose, Bld: 113 mg/dL — ABNORMAL HIGH (ref 70–99)
Potassium: 3.7 mmol/L (ref 3.5–5.1)
Sodium: 139 mmol/L (ref 135–145)

## 2020-05-13 LAB — RETICULOCYTES
Immature Retic Fract: 30.3 % — ABNORMAL HIGH (ref 2.3–15.9)
RBC.: 3.8 MIL/uL — ABNORMAL LOW (ref 3.87–5.11)
Retic Count, Absolute: 53.2 10*3/uL (ref 19.0–186.0)
Retic Ct Pct: 1.4 % (ref 0.4–3.1)

## 2020-05-13 LAB — IRON AND TIBC
Iron: 14 ug/dL — ABNORMAL LOW (ref 28–170)
Saturation Ratios: 5 % — ABNORMAL LOW (ref 10.4–31.8)
TIBC: 270 ug/dL (ref 250–450)
UIBC: 256 ug/dL

## 2020-05-13 LAB — FERRITIN: Ferritin: 65 ng/mL (ref 11–307)

## 2020-05-13 MED ORDER — TIMOLOL HEMIHYDRATE 0.5 % OP SOLN: 1.0000 [drp] | Freq: Two times a day (BID) | OPHTHALMIC | Status: DC

## 2020-05-13 MED ORDER — SODIUM CHLORIDE 0.9 % IV SOLN
510.0000 mg | Freq: Once | INTRAVENOUS | Status: AC
  Administered 2020-05-13: 510 mg via INTRAVENOUS
  Filled 2020-05-13: qty 17

## 2020-05-13 MED ORDER — TIMOLOL MALEATE 0.5 % OP SOLN
1.0000 [drp] | Freq: Two times a day (BID) | OPHTHALMIC | Status: DC
  Administered 2020-05-13 – 2020-05-16 (×6): 1 [drp] via OPHTHALMIC
  Filled 2020-05-13 (×2): qty 5

## 2020-05-13 MED ORDER — AMLODIPINE BESYLATE 5 MG PO TABS
5.0000 mg | ORAL_TABLET | Freq: Every day | ORAL | Status: DC
  Administered 2020-05-13 – 2020-05-16 (×4): 5 mg via ORAL
  Filled 2020-05-13 (×4): qty 1

## 2020-05-13 MED ORDER — KETOROLAC TROMETHAMINE 0.5 % OP SOLN
1.0000 [drp] | Freq: Two times a day (BID) | OPHTHALMIC | Status: DC
  Administered 2020-05-13 – 2020-05-16 (×6): 1 [drp] via OPHTHALMIC
  Filled 2020-05-13: qty 5

## 2020-05-13 MED ORDER — PROSOURCE PLUS PO LIQD
30.0000 mL | Freq: Two times a day (BID) | ORAL | Status: DC
  Administered 2020-05-13: 30 mL via ORAL
  Filled 2020-05-13 (×4): qty 30

## 2020-05-13 NOTE — Plan of Care (Signed)
?  Problem: Coping: ?Goal: Level of anxiety will decrease ?Outcome: Progressing ?  ?Problem: Safety: ?Goal: Ability to remain free from injury will improve ?Outcome: Progressing ?  ?

## 2020-05-13 NOTE — Progress Notes (Addendum)
Subjective: HD#2   Overnight: None  Today, Tina Mercado is alert and not in any acute distress. She reports feeling better. Unsure about pain level. She states that she does not have any trouble with breathing. She states that she has little apetie.   Objective:  Vital signs in last 24 hours: Vitals:   05/12/20 0443 05/12/20 1500 05/12/20 2014 05/13/20 0412  BP: (!) 174/74 (!) 150/61 (!) 167/79 (!) 177/84  Pulse: (!) 105 99 (!) 108 (!) 110  Resp: 14 16 14 14   Temp: 98.4 F (36.9 C) 98.1 F (36.7 C) 98.6 F (37 C) 98.4 F (36.9 C)  TempSrc: Oral Oral Oral Oral  SpO2: 94% 93% 90% 94%   Const: In no apparent distress, lying comfortably in bed, conversational CV: RRR, systolic murmur appreciated at the left fourth intercostal space.  Assessment/Plan:  Principal Problem:   Nondisplaced fracture of left inferior pubic rami Active Problems:   Pure hypercholesterolemia   Osteoporosis   COPD mixed type ([place])   Hypertension   Adult failure to thrive syndrome   NAGMA   Hip fracture (HCC)   Closed fracture of left inferior pubic ramus ([place])   Fall  Tina C Honeycuttis a 86 y.o.femalewith hx of osteoporosis, left femur fracture s/p intramedullary nail in 2020, COPD 2/2 environmental exposure, HTN, HLD, hearing difficultypresenting with L inferior pubic rami fracture. Pain controlled with medications.  #Left pubic rami fracture following mechanical fall #Stress fracture Pain is much better today. -continuetylenol 650mg  q6 prn -continueoxycodone 5mg  q4 prn -per ortho, weightbearing as tolerated -f/u with ortho in 3 weeks with Dr. Doreatha Martin   #Prerenal azotemia-resolved Fractional excretion of sodium less than 1% -LR at 75 mL/hour   #Protein calorie malnutrition Albumin 2.5.  She has not been eating due to decreased appetite.  I ordered Ensure supplements but states she does not like the taste.  I advised her that I would order prostat and she is amenable to  trying this -Regular diet with protein supplements   #Hypoproliferative normocytic iron deficiency anemia Ferritin 65, iron 14, saturation 5.  Her total iron deficit is about 850 mg.  Hemoglobin this morning 10.3<<12.7.  Surprisingly her platelet count is also down today at 120 and I suspect a dilutional component due to IV fluids -Feraheme x1 dose   #Urinary retention Subsequent bladder scans have been less than 300 cc -Bladder scans every 6 hours, in and out as needed   #COPD due to environmental exposure -Dulera BID -Duo-neb prn    #HLD -continue crestor 10mg  daily   #Systolic heart murmur best heard at apex Echo from 01/2020 with mild mitral regurgitation. Followed by cardiology   #HTN -Start amlodipine 5 mg daily   #Chronic Non Anion-gap metabolic acidosis-unclear etiology HCO3 19<<19.  There have not been any reports of diarrhea or GI losses.  I highly doubt this is renal tubular acidosis as her urinalysis was unremarkable.  She does have COPD for which chronic hyperventilation can cause non-anion gap metabolic acidosis also as indicated by her VBG which showed PCO2 of 31 though this could be compensation for her metabolic acidosis.  She also has protein calorie malnutrition with low albumin of 2.5 which could also be another contributing etiology. -f/u BMP daily   Diet:  Regular diet with Ensure supplementation IVF: LR at 75 mL/hour VTE: Subcutaneous heparin Prior to Admission Living Arrangement: Unknown Anticipated Discharge Location: SNF Barriers to Discharge: Pending SNF placement Dispo: Anticipated discharge in approximately2-3day(s).  Jean Rosenthal, MD 05/13/2020, 6:10 AM Pager: [phone] Internal Medicine Teaching Service After 5pm on weekdays and 1pm on weekends: On Call pager: [phone]

## 2020-05-14 ENCOUNTER — Other Ambulatory Visit: Payer: Self-pay

## 2020-05-14 LAB — BASIC METABOLIC PANEL
Anion gap: 9 (ref 5–15)
BUN: 12 mg/dL (ref 8–23)
CO2: 21 mmol/L — ABNORMAL LOW (ref 22–32)
Calcium: 8.2 mg/dL — ABNORMAL LOW (ref 8.9–10.3)
Chloride: 109 mmol/L (ref 98–111)
Creatinine, Ser: 0.74 mg/dL (ref 0.44–1.00)
GFR calc Af Amer: >60 mL/min (ref >60)
GFR calc non Af Amer: >60 mL/min (ref >60)
Glucose, Bld: 97 mg/dL (ref 70–99)
Potassium: 3.5 mmol/L (ref 3.5–5.1)
Sodium: 139 mmol/L (ref 135–145)

## 2020-05-14 MED ORDER — BOOST / RESOURCE BREEZE PO LIQD CUSTOM
1.0000 | Freq: Three times a day (TID) | ORAL | Status: DC
  Administered 2020-05-14 – 2020-05-16 (×6): 1 via ORAL

## 2020-05-14 NOTE — Progress Notes (Signed)
   Subjective:  Patient is seen at bedside today. She states that she has not been walking. She reports pain on Left hip, worse with movement and better with rest. She states that she has not been eating but drank the Pro-stat.   Objective:  Vital signs in last 24 hours: Vitals:   05/13/20 0858 05/13/20 1449 05/13/20 1940 05/13/20 1958  BP: (!) 160/69 140/68 (!) 161/68   Pulse: 84 72 82 83  Resp: 14 16 18 16   Temp: 98.3 F (36.8 C) 98.4 F (36.9 C) 98.6 F (37 C)   TempSrc: Oral Oral Oral   SpO2: 92% 94% 94% 96%    Physical Exam Constitutional: no acute distress Cardiovascular: Systolic murmur at the 4th left intercostal space Abdominal: flat, nontender, no rebound tenderness, bowel sounds normal Musculoskeletal: Able to move bilateral foot without difficulties  Assessment/Plan: Principal Problem:   Nondisplaced fracture of left inferior pubic rami Active Problems:   Pure hypercholesterolemia   Osteoporosis   COPD mixed type (HCC)   Hypertension   Adult failure to thrive syndrome   NAGMA   Hip fracture (HCC)   Closed fracture of left inferior pubic ramus ([place])   Fall   Fabian C Honeycuttis a 86 y.o.femalewith hx of osteoporosis, left femur fracture s/p intramedullary nail in 2020, COPD 2/2 environmental exposure, HTN, HLD, hearing difficultypresenting with L inferior pubic rami fracture. Pain controlled with medications.  #Left pubic rami fracture following mechanical fall #Stress fracture She will work with PT today. Pending SNF placement  -continuetylenol 650mg  q6 prn -continueoxycodone 5mg  q4 prn -per ortho, weightbearing as tolerated -f/u with ortho in 3 weeks with Dr. Doreatha Martin   #Prerenal azotemia-resolved Fractional excretion of sodium less than 1% -LR at 75 mL/hour   #Protein calorie malnutrition She requested for Boost nutritional supplements  -Regular diet with protein supplements   #Hypoproliferative normocytic iron deficiency  anemia -s/p Feraheme x1 dose   #Urinary retention She has been voiding without difficulties -Bladder scans BID, in and out as needed   #COPD due to environmental exposure -Dulera BID -Duo-neb prn    #HLD -continue crestor 10mg  daily   #Systolic heart murmur best heard at apex Echo from 01/2020 with mild mitral regurgitation. Followed by cardiology   #HTN -Start amlodipine 5 mg daily   #ChronicNonAnion-gap metabolic acidosis-unclear etiology Improving  -f/u BMP daily   Diet: Regular diet with Ensure supplementation IVF:LR at 75 mL/hour OLM:[id number] heparin Prior to Admission Living Arrangement: ALF Anticipated Discharge Location:SNF Barriers to [place] [facility]F placement Dispo: Anticipated discharge in approximately2-3day(s).  Andrew Au, MD 05/14/2020, 5:49 AM Pager: [phone] After 5pm on weekdays and 1pm on weekends: On Call pager [phone]

## 2020-05-14 NOTE — Plan of Care (Signed)

## 2020-05-14 NOTE — Progress Notes (Signed)
Physical Therapy Treatment Patient Details Name: Tina Mercado MRN: [id number] DOB: January 13, 1929 Today's Date: 05/14/2020    History of Present Illness Pt is a 86 y.o. F with significant PMH of COPD, hearing loss, glaucoma, osteoporosis, left IMN (2020) who presents after a fall with a left inferior pubic ramus fracture.     PT Comments    Pt able to progress to short bout of steps away from the bed.  Pt is requring decreased assistance overall.  Plan for SNF placement remains appropriate to continue to improve strength and function.      Follow Up Recommendations  SNF     Equipment Recommendations  None recommended by PT    Recommendations for Other Services       Precautions / Restrictions Precautions Precautions: Fall Restrictions Weight Bearing Restrictions: Yes RLE Weight Bearing: Weight bearing as tolerated LLE Weight Bearing: Weight bearing as tolerated    Mobility  Bed Mobility Overal bed mobility: Needs Assistance Bed Mobility: Supine to Sit     Supine to sit: Mod assist     General bed mobility comments: Mod assistance to advance LEs to edge of bed and elevate trunk into a seated position.  Transfers Overall transfer level: Needs assistance Equipment used: Rolling walker (2 wheeled) Transfers: Sit to/from Stand Sit to Stand: Min assist         General transfer comment: Cues for hand placement to and from seated surface.  Pt with increased ability this session.  Ambulation/Gait Ambulation/Gait assistance: Min assist Gait Distance (Feet): 4 Feet Assistive device: Rolling walker (2 wheeled) Gait Pattern/deviations: Step-to pattern;Trunk flexed;Antalgic     General Gait Details: Steps away from bed toward potty chair.  Attempted more steps after toileting but patient unable due to pain and fatigue so brought recliner chair up to her.   Stairs             Wheelchair Mobility    Modified Rankin (Stroke Patients Only)       Balance  Overall balance assessment: Needs assistance Sitting-balance support: Feet supported Sitting balance-Leahy Scale: Fair Sitting balance - Comments: reliant on BUE support, progressing to supervision    Standing balance support: Bilateral upper extremity supported Standing balance-Leahy Scale: Poor Standing balance comment: B UE and external assist                            Cognition Arousal/Alertness: Awake/alert Behavior During Therapy: Anxious Overall Cognitive Status: Within Functional Limits for tasks assessed                                 General Comments: "I don't remember a thing about the first three days I was here.  I never want to take that medicine again."      Exercises General Exercises - Lower Extremity Ankle Circles/Pumps: PROM;Both;20 reps;Supine Quad Sets: AROM;Both;10 reps;Supine Heel Slides: AROM;Both;10 reps;Supine Hip ABduction/ADduction: AROM;Both;10 reps;Supine Straight Leg Raises: AROM;Both;10 reps;Supine    General Comments        Pertinent Vitals/Pain Pain Assessment: Faces Faces Pain Scale: Hurts even more Pain Location: L groin and R hip Pain Descriptors / Indicators: Grimacing;Guarding Pain Intervention(s): Monitored during session;Repositioned    Home Living   Living Arrangements: Alone                  Prior Function  PT Goals (current goals can now be found in the care plan section) Acute Rehab PT Goals Patient Stated Goal: to go to the bathroom Potential to Achieve Goals: Fair Progress towards PT goals: Progressing toward goals    Frequency    Min 3X/week      PT Plan Current plan remains appropriate    Co-evaluation              AM-PAC PT "6 Clicks" Mobility   Outcome Measure  Help needed turning from your back to your side while in a flat bed without using bedrails?: Total Help needed moving from lying on your back to sitting on the side of a flat bed without using  bedrails?: Total Help needed moving to and from a bed to a chair (including a wheelchair)?: Total Help needed standing up from a chair using your arms (e.g., wheelchair or bedside chair)?: Total Help needed to walk in hospital room?: Total Help needed climbing 3-5 steps with a railing? : Total 6 Click Score: 6    End of Session Equipment Utilized During Treatment: Gait belt Activity Tolerance: Patient limited by lethargy Patient left: with call bell/phone within reach;in chair;with chair alarm set Nurse Communication: Mobility status PT Visit Diagnosis: Pain;Other abnormalities of gait and mobility (R26.89);Muscle weakness (generalized) (M62.81) Pain - Right/Left: Left Pain - part of body: Hip     Time: 2111-5520 PT Time Calculation (min) (ACUTE ONLY): 33 min  Charges:  $Therapeutic Exercise: 8-22 mins $Therapeutic Activity: 8-22 mins                     Erasmo Leventhal , PTA Acute Rehabilitation Services Pager [phone] Office [phone]     Erhard Senske Eli Hose 05/14/2020, 6:49 PM

## 2020-05-14 NOTE — Plan of Care (Signed)
°  Problem: Education: Goal: Knowledge of General Education information will improve Description: Including pain rating scale, medication(s)/side effects and non-pharmacologic comfort measures Outcome: Progressing   Problem: Health Behavior/Discharge Planning: Goal: Ability to manage health-related needs will improve Outcome: Progressing   Problem: Activity: Goal: Risk for activity intolerance will decrease Outcome: Progressing   Problem: Pain Managment: Goal: General experience of comfort will improve Outcome: Progressing   Problem: Safety: Goal: Ability to remain free from injury will improve Outcome: Progressing   Problem: Skin Integrity: Goal: Risk for impaired skin integrity will decrease Outcome: Progressing

## 2020-05-14 NOTE — Plan of Care (Signed)
  Problem: Education: Goal: Knowledge of General Education information will improve Description: Including pain rating scale, medication(s)/side effects and non-pharmacologic comfort measures Outcome: Progressing   Problem: Health Behavior/Discharge Planning: Goal: Ability to manage health-related needs will improve Outcome: Progressing   Problem: Activity: Goal: Risk for activity intolerance will decrease Outcome: Progressing   Problem: Coping: Goal: Level of anxiety will decrease Outcome: Progressing   Problem: Elimination: Goal: Will not experience complications related to bowel motility Outcome: Progressing   Problem: Pain Managment: Goal: General experience of comfort will improve Outcome: Progressing   Problem: Safety: Goal: Ability to remain free from injury will improve Outcome: Progressing   Problem: Skin Integrity: Goal: Risk for impaired skin integrity will decrease Outcome: Progressing   

## 2020-05-15 LAB — CBC
HCT: 32.1 % — ABNORMAL LOW (ref 36.0–46.0)
Hemoglobin: 10.4 g/dL — ABNORMAL LOW (ref 12.0–15.0)
MCH: 28.5 pg (ref 26.0–34.0)
MCHC: 32.4 g/dL (ref 30.0–36.0)
MCV: 87.9 fL (ref 80.0–100.0)
Platelets: 166 10*3/uL (ref 150–400)
RBC: 3.65 MIL/uL — ABNORMAL LOW (ref 3.87–5.11)
RDW: 14.9 % (ref 11.5–15.5)
WBC: 7.1 10*3/uL (ref 4.0–10.5)
nRBC: 0 % (ref 0.0–0.2)

## 2020-05-15 LAB — BASIC METABOLIC PANEL
Anion gap: 8 (ref 5–15)
BUN: 17 mg/dL (ref 8–23)
CO2: 22 mmol/L (ref 22–32)
Calcium: 8.1 mg/dL — ABNORMAL LOW (ref 8.9–10.3)
Chloride: 110 mmol/L (ref 98–111)
Creatinine, Ser: 0.76 mg/dL (ref 0.44–1.00)
GFR calc Af Amer: >60 mL/min (ref >60)
GFR calc non Af Amer: >60 mL/min (ref >60)
Glucose, Bld: 113 mg/dL — ABNORMAL HIGH (ref 70–99)
Potassium: 3.3 mmol/L — ABNORMAL LOW (ref 3.5–5.1)
Sodium: 140 mmol/L (ref 135–145)

## 2020-05-15 LAB — SARS CORONAVIRUS 2 BY RT PCR (HOSPITAL ORDER, PERFORMED IN ~~LOC~~ HOSPITAL LAB): SARS Coronavirus 2: NEGATIVE

## 2020-05-15 MED ORDER — ENOXAPARIN SODIUM 30 MG/0.3ML ~~LOC~~ SOLN
30.0000 mg | SUBCUTANEOUS | Status: DC
  Administered 2020-05-15 – 2020-05-16 (×2): 30 mg via SUBCUTANEOUS
  Filled 2020-05-15 (×2): qty 0.3

## 2020-05-15 MED ORDER — ONDANSETRON HCL 4 MG PO TABS: 4.0000 mg | ORAL_TABLET | Freq: Three times a day (TID) | ORAL | Status: DC | PRN

## 2020-05-15 MED ORDER — POTASSIUM CHLORIDE CRYS ER 20 MEQ PO TBCR
20.0000 meq | EXTENDED_RELEASE_TABLET | Freq: Two times a day (BID) | ORAL | Status: AC
  Administered 2020-05-15 (×2): 20 meq via ORAL
  Filled 2020-05-15 (×2): qty 1

## 2020-05-15 NOTE — Discharge Instructions (Signed)
Tina Mercado  It was a pleasure working with you during your hospital stay. During your stay you were found to have a hip fracture, for which you were managed medically. You will be discharged for further rehabilitation at a skilled nursing facility.   Simple Pelvic Fracture, Adult A pelvic fracture is a break in one of the bones in the pelvis. The pelvic bones include the bones that you sit on and the bones that make up the lower part of your spine. A pelvic fracture is called simple if:  There is only one break.  The broken bone is stable.  The broken bone is not moving out of place.  The bone does not pierce the skin. A pelvic fracture may occur along with injuries to nerves, blood vessels, soft tissues, the urinary tract, and abdominal organs. What are the causes? Common causes of this type of fracture include:  A fall.  A car accident.  Force or pressure that hits the pelvis. What increases the risk? You are more likely to get this injury if you:  Play high-impact sports, such as rugby or football.  You have thinning or weakening of your bones, such as from osteopenia or osteoporosis.  Have cancer that has spread to the bone.  Have a condition that is associated with falling, such as Parkinson's disease or seizure.  Have had a stroke.  Smoke. What are the signs or symptoms? Signs and symptoms may include:  Tenderness, swelling, or bruising in the affected area.  Pain when moving the hip.  Pain when walking or standing. How is this diagnosed? This condition is diagnosed with a physical exam, X-ray, or CT scan. You may also have blood or urine tests:  To rule out damage to other organs, such as the urethra.  To check for internal bleeding in the pelvic area. How is this treated? The goal of treatment is to get the bone to heal in its original position. Treatment includes:  Staying in bed (bed rest).  Using crutches, a walker, or a wheelchair until the  bone heals.  Medicines to treat pain.  Medicines to prevent blood clots from forming in your legs.  Physical therapy. Follow these instructions at home: Medicines  Take over-the-counter and prescription medicines only as told by your health care provider.  Do not drive or use heavy machinery while taking prescription pain medicine. Managing pain, stiffness, and swelling   If directed, apply ice to the injured area: ? Put ice in a plastic bag. ? Place a towel between your skin and the bag. ? Leave the ice on for 20 minutes, 2-3 times a day.  Gently move your toes often to avoid stiffness and to lessen swelling. Activity  Stay on bed rest for as long as directed by your health care provider.  While on bed rest: ? Change the position of your legs every 1-2 hours. This keeps blood moving well through both of your legs. ? You may sit for as long as you feel comfortable.  After bed rest: ? Avoid strenuous activities for as long as directed by your health care provider. ? Return to your normal activities as directed by your health care provider. Ask your health care provider what activities are safe for you.  Use items to help you with your activities, such as: ? A long-handled shoehorn to help you put your shoes on. ? Elastic shoelaces that do not need to be retied. ? A reacher or grabber to pick items up  off the floor. General instructions   Do not  drive or operate heavy machinery until your health care provider tells you it is safe to do so.  Use a wheelchair or assistive devices as directed by your health care provider. When you are ready to walk, start by using crutches or a walker to help support your body weight.  Have someone help you at home as you recover.  Wear compression stockings as told by your health care provider.  Do not use any products that contain nicotine or tobacco, such as cigarettes and e-cigarettes. These can delay bone healing. If you need help  quitting, ask your health care provider.  If you have an underlying condition that caused your pelvic fracture, work with your health care provider to manage your condition.  Keep all follow-up visits as told by your health care provider. This is important. Contact a health care provider if:  Your pain gets worse.  Your pain is not relieved with medicines. Get help right away if you:  Feel light-headed or faint.  Develop chest pain.  Develop shortness of breath.  Have a fever.  Have blood in your urine or your stools.  Have bleeding in your vagina.  Have difficulty or pain with urination or with passing stool.  Have difficulty or increased pain with walking.  Have new or increased swelling in one of your legs.  Have numbness in your legs or groin area. Summary  A pelvic fracture is a break in one of the bones in the pelvis. These are the bones that you sit on and the bones that make up the lower part of your spine.  A pelvic fracture is called simple if there is only one break, the broken bone is stable, the broken bone is not moving out of place, or the bone does not pierce the skin.  Common causes of this type of fracture include a fall, a car accident, or a force or pressure that hits the pelvis.  The goal of treatment is to get the bone to heal in its original position.  Treatment includes bed rest and using a wheelchair. When ready to walk, you may use crutches or a walker until your bone heals. Other treatments include physical therapy and medicine to treat pain and prevent blood clots. This information is not intended to replace advice given to you by your health care provider. Make sure you discuss any questions you have with your health care provider. Document Revised: 05/20/2018 Document Reviewed: 11/23/2017 Elsevier Patient Education  2020 [employer].

## 2020-05-15 NOTE — Plan of Care (Signed)

## 2020-05-15 NOTE — Progress Notes (Signed)
  Date: 05/15/2020  Patient name: Tina Mercado  Medical record number: [id number]  Date of birth: 1929-05-21        I have seen and evaluated this patient and I have discussed the plan of care with the house staff. Please see Dr. Gilford Rile' note for complete details. I concur with his findings and plan.    Sid Falcon, MD 05/15/2020, 5:12 PM
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
## 2020-05-15 NOTE — Progress Notes (Signed)
Pt ambulated in room while on pulse ox, with room air pt maintained 88-90%, with nasal canula 2L pt in mid 90s. Will continue to monitor and educate on IS use.

## 2020-05-15 NOTE — Progress Notes (Addendum)
Subjective:  ON Events: None  Patient is seen at bedside today. She reports doing well. She states that her pain is controlled unless she walks. She states that she has worked with PT. She does complain of nausea but no vomit. Her appetite has not been so great and her fluid intake also poor. She states she urinates fine. She states that she does not use O2 at home. We discussed ambulating with staff to see if she requires supplemental O2 before discharge. She is agreeable to the plan.   Objective:  Vital signs in last 24 hours: Vitals:   05/14/20 1618 05/14/20 1919 05/14/20 2015 05/15/20 0408  BP: (!) 165/83  (!) 158/66 (!) 166/67  Pulse: 86  81 70  Resp: 16  16 16   Temp: (!) 97.4 F (36.3 C)  98.5 F (36.9 C) (!) 97.4 F (36.3 C)  TempSrc: Oral  Oral Oral  SpO2:  94% 92% 92%  Weight: 54.4 kg     Height: 5\' 3"  (1.6 m)       Physical Exam Constitutional:      General: She is not in acute distress.    Appearance: She is not ill-appearing or toxic-appearing.  Cardiovascular:     Rate and Rhythm: Normal rate and regular rhythm.     Pulses: Normal pulses.     Heart sounds: Normal heart sounds. No murmur heard.  No friction rub. No gallop.   Pulmonary:     Effort: Pulmonary effort is normal.     Breath sounds: Normal breath sounds. No wheezing.  Abdominal:     General: Abdomen is flat. Bowel sounds are normal.     Tenderness: There is no abdominal tenderness.  Skin:    General: Skin is warm and dry.  Neurological:     Mental Status: She is alert.    LABS:  CBC Latest Ref Rng & Units 05/15/2020 05/13/2020 05/11/2020  WBC 4.0 - 10.5 K/uL 7.1 8.8 13.0(H)  Hemoglobin 12.0 - 15.0 g/dL 10.4(L) 10.3(L) 12.7  Hematocrit 36 - 46 % 32.1(L) 32.1(L) 39.4  Platelets 150 - 400 K/uL 166 120(L) 177   BMP Latest Ref Rng & Units 05/15/2020 05/14/2020 05/13/2020  Glucose 70 - 99 mg/dL 113(H) 97 113(H)  BUN 8 - 23 mg/dL 17 12 12   Creatinine 0.44 - 1.00 mg/dL 0.76 0.74 0.77  BUN/Creat  Ratio 12 - 28 - - -  Sodium 135 - 145 mmol/L 140 139 139  Potassium 3.5 - 5.1 mmol/L 3.3(L) 3.5 3.7  Chloride 98 - 111 mmol/L 110 109 109  CO2 22 - 32 mmol/L 22 21(L) 19(L)  Calcium 8.9 - 10.3 mg/dL 8.1(L) 8.2(L) 8.3(L)   Assessment/Plan: Principal Problem:   Nondisplaced fracture of left inferior pubic rami Active Problems:   Pure hypercholesterolemia   Osteoporosis   COPD mixed type ([place])   Hypertension   Adult failure to thrive syndrome   NAGMA   Hip fracture (HCC)   Closed fracture of left inferior pubic ramus ([place])   Fall   Dot C Honeycuttis a 86 y.o.femalewith hx of osteoporosis, left femur fracture s/p intramedullary nail in 2020, COPD 2/2 environmental exposure, HTN, HLD, hearing difficultypresenting with L inferior pubic rami fracture. Pain controlled with medications.  Left Pubic Rami fracture following mechanical fall Stress fracture Pending SNF placement  -continuetylenol 650mg  q6 prn -continueoxycodone 5mg  q4 prn -per ortho, weight bearing as tolerated -f/u with ortho in 3 weeks with Dr. Doreatha Martin  Protein Calorie Malnutrition She requested for Boost nutritional  supplements  -Regular diet with protein supplements  Hypoproliferative Normocytic Iron Deficiency Anemia: -s/p Feraheme x1 dose  Urinary Retention: She has been voiding without difficulties -Bladder scans BID, in and out as needed  COPD due to environmental exposure Patient not complaining of shortness of breath, currently on 1-2L of O2 supplementation  Will ambulate to determine if O2 supplementation needed at discharge.  -Dulera BID -Duo-neb prn   HLD -Continue crestor 10mg  daily  Systolic heart murmur best heard at apex Echo from 01/2020 with mild mitral regurgitation. Followed by cardiology  Hypertension: -Start amlodipine 5 mg daily  #ChronicNonAnion-gap metabolic acidosis-unclear etiology Improving  -f/u BMP daily  Prerenal Azotemia: Resolved  Diet: Regular  diet with Ensure supplementation IVF:LR at 75 mL/hour GQB:VQXIHWTUUEKC heparin Prior to Admission Living Arrangement: ALF Anticipated Discharge Location:SNF Barriers to [facility] placement Dispo: Anticipated discharge pending SNF placement.  Maudie Mercury, MD 05/15/2020, 7:12 AM Pager: [phone] After 5pm on weekdays and 1pm on weekends: On Call pager [phone]

## 2020-05-16 DIAGNOSIS — K5901 Slow transit constipation: Secondary | ICD-10-CM | POA: Diagnosis not present

## 2020-05-16 DIAGNOSIS — H5410 Blindness, one eye, low vision other eye, unspecified eyes: Secondary | ICD-10-CM | POA: Diagnosis not present

## 2020-05-16 DIAGNOSIS — S32502A Unspecified fracture of left pubis, initial encounter for closed fracture: Secondary | ICD-10-CM | POA: Diagnosis not present

## 2020-05-16 DIAGNOSIS — R29898 Other symptoms and signs involving the musculoskeletal system: Secondary | ICD-10-CM | POA: Diagnosis not present

## 2020-05-16 DIAGNOSIS — R600 Localized edema: Secondary | ICD-10-CM | POA: Diagnosis not present

## 2020-05-16 DIAGNOSIS — Z7401 Bed confinement status: Secondary | ICD-10-CM | POA: Diagnosis not present

## 2020-05-16 DIAGNOSIS — I340 Nonrheumatic mitral (valve) insufficiency: Secondary | ICD-10-CM | POA: Diagnosis not present

## 2020-05-16 DIAGNOSIS — Z79899 Other long term (current) drug therapy: Secondary | ICD-10-CM | POA: Diagnosis not present

## 2020-05-16 DIAGNOSIS — R1312 Dysphagia, oropharyngeal phase: Secondary | ICD-10-CM | POA: Diagnosis not present

## 2020-05-16 DIAGNOSIS — K219 Gastro-esophageal reflux disease without esophagitis: Secondary | ICD-10-CM | POA: Diagnosis not present

## 2020-05-16 DIAGNOSIS — R0602 Shortness of breath: Secondary | ICD-10-CM | POA: Diagnosis not present

## 2020-05-16 DIAGNOSIS — S72002D Fracture of unspecified part of neck of left femur, subsequent encounter for closed fracture with routine healing: Secondary | ICD-10-CM | POA: Diagnosis not present

## 2020-05-16 DIAGNOSIS — K5900 Constipation, unspecified: Secondary | ICD-10-CM | POA: Diagnosis not present

## 2020-05-16 DIAGNOSIS — R2681 Unsteadiness on feet: Secondary | ICD-10-CM | POA: Diagnosis not present

## 2020-05-16 DIAGNOSIS — H409 Unspecified glaucoma: Secondary | ICD-10-CM | POA: Diagnosis not present

## 2020-05-16 DIAGNOSIS — G629 Polyneuropathy, unspecified: Secondary | ICD-10-CM | POA: Diagnosis not present

## 2020-05-16 DIAGNOSIS — R42 Dizziness and giddiness: Secondary | ICD-10-CM | POA: Diagnosis not present

## 2020-05-16 DIAGNOSIS — R52 Pain, unspecified: Secondary | ICD-10-CM | POA: Diagnosis not present

## 2020-05-16 DIAGNOSIS — K449 Diaphragmatic hernia without obstruction or gangrene: Secondary | ICD-10-CM | POA: Diagnosis not present

## 2020-05-16 DIAGNOSIS — M6281 Muscle weakness (generalized): Secondary | ICD-10-CM | POA: Diagnosis not present

## 2020-05-16 DIAGNOSIS — R011 Cardiac murmur, unspecified: Secondary | ICD-10-CM | POA: Diagnosis not present

## 2020-05-16 DIAGNOSIS — M2550 Pain in unspecified joint: Secondary | ICD-10-CM | POA: Diagnosis not present

## 2020-05-16 DIAGNOSIS — E872 Acidosis: Secondary | ICD-10-CM | POA: Diagnosis not present

## 2020-05-16 DIAGNOSIS — R7303 Prediabetes: Secondary | ICD-10-CM | POA: Diagnosis not present

## 2020-05-16 DIAGNOSIS — H04123 Dry eye syndrome of bilateral lacrimal glands: Secondary | ICD-10-CM | POA: Diagnosis not present

## 2020-05-16 DIAGNOSIS — I5020 Unspecified systolic (congestive) heart failure: Secondary | ICD-10-CM | POA: Diagnosis not present

## 2020-05-16 DIAGNOSIS — R339 Retention of urine, unspecified: Secondary | ICD-10-CM | POA: Diagnosis not present

## 2020-05-16 DIAGNOSIS — M810 Age-related osteoporosis without current pathological fracture: Secondary | ICD-10-CM | POA: Diagnosis not present

## 2020-05-16 DIAGNOSIS — D649 Anemia, unspecified: Secondary | ICD-10-CM | POA: Diagnosis not present

## 2020-05-16 DIAGNOSIS — R5381 Other malaise: Secondary | ICD-10-CM | POA: Diagnosis not present

## 2020-05-16 DIAGNOSIS — E785 Hyperlipidemia, unspecified: Secondary | ICD-10-CM | POA: Diagnosis not present

## 2020-05-16 DIAGNOSIS — S32592D Other specified fracture of left pubis, subsequent encounter for fracture with routine healing: Secondary | ICD-10-CM | POA: Diagnosis not present

## 2020-05-16 DIAGNOSIS — Z862 Personal history of diseases of the blood and blood-forming organs and certain disorders involving the immune mechanism: Secondary | ICD-10-CM | POA: Diagnosis not present

## 2020-05-16 DIAGNOSIS — S32592A Other specified fracture of left pubis, initial encounter for closed fracture: Secondary | ICD-10-CM | POA: Diagnosis not present

## 2020-05-16 DIAGNOSIS — I119 Hypertensive heart disease without heart failure: Secondary | ICD-10-CM | POA: Diagnosis not present

## 2020-05-16 DIAGNOSIS — I10 Essential (primary) hypertension: Secondary | ICD-10-CM | POA: Diagnosis not present

## 2020-05-16 DIAGNOSIS — W19XXXA Unspecified fall, initial encounter: Secondary | ICD-10-CM | POA: Diagnosis not present

## 2020-05-16 DIAGNOSIS — R609 Edema, unspecified: Secondary | ICD-10-CM | POA: Diagnosis not present

## 2020-05-16 DIAGNOSIS — N390 Urinary tract infection, site not specified: Secondary | ICD-10-CM | POA: Diagnosis not present

## 2020-05-16 DIAGNOSIS — J449 Chronic obstructive pulmonary disease, unspecified: Secondary | ICD-10-CM | POA: Diagnosis not present

## 2020-05-16 DIAGNOSIS — E7800 Pure hypercholesterolemia, unspecified: Secondary | ICD-10-CM | POA: Diagnosis not present

## 2020-05-16 LAB — BASIC METABOLIC PANEL
Anion gap: 9 (ref 5–15)
BUN: 16 mg/dL (ref 8–23)
CO2: 21 mmol/L — ABNORMAL LOW (ref 22–32)
Calcium: 8.4 mg/dL — ABNORMAL LOW (ref 8.9–10.3)
Chloride: 111 mmol/L (ref 98–111)
Creatinine, Ser: 0.7 mg/dL (ref 0.44–1.00)
GFR calc Af Amer: >60 mL/min (ref >60)
GFR calc non Af Amer: >60 mL/min (ref >60)
Glucose, Bld: 107 mg/dL — ABNORMAL HIGH (ref 70–99)
Potassium: 3.7 mmol/L (ref 3.5–5.1)
Sodium: 141 mmol/L (ref 135–145)

## 2020-05-16 MED ORDER — OXYCODONE HCL 5 MG PO TABS: 5.0000 mg | ORAL_TABLET | ORAL | 0 refills | Status: AC | PRN

## 2020-05-16 MED ORDER — AMLODIPINE BESYLATE 5 MG PO TABS: 5.0000 mg | ORAL_TABLET | Freq: Every day | ORAL | 0 refills | Status: DC

## 2020-05-16 MED ORDER — ACETAMINOPHEN 325 MG PO TABS: 650.0000 mg | ORAL_TABLET | Freq: Four times a day (QID) | ORAL | 0 refills | Status: DC | PRN

## 2020-05-16 NOTE — Progress Notes (Signed)
Physical Therapy Treatment Patient Details Name: Tina Mercado MRN: [id number] DOB: August 12, 1929 Today's Date: 05/16/2020    History of Present Illness Pt is a 86 y.o. F with significant PMH of COPD, hearing loss, glaucoma, osteoporosis, left IMN (2020) who presents after a fall with a left inferior pubic ramus fracture.     PT Comments    Pt seated in recliner on arrival.  She reports she is nervous she will not be able to walk due to the pain.  Pt required max cues for encouragement and progressed to 10 ft of gt training.  Plan for SNF remains appropriate at d/c to maximize functional gains before returning home.      Follow Up Recommendations  SNF     Equipment Recommendations  None recommended by PT    Recommendations for Other Services       Precautions / Restrictions Precautions Precautions: Fall Restrictions Weight Bearing Restrictions: Yes RLE Weight Bearing: Weight bearing as tolerated LLE Weight Bearing: Weight bearing as tolerated    Mobility  Bed Mobility         Supine to sit: Min assist     General bed mobility comments: Pt seated in recliner on arrival. Once finished able to scoot in recliner posterior without assistance.  Transfers Overall transfer level: Needs assistance Equipment used: Rolling walker (2 wheeled) Transfers: Sit to/from Stand Sit to Stand: Min guard;Min assist         General transfer comment: Cues for hand placement to and from seated surface.  Pt very guarded.  Ambulation/Gait Ambulation/Gait assistance: Min assist Gait Distance (Feet): 10 Feet Assistive device: Rolling walker (2 wheeled) Gait Pattern/deviations: Step-to pattern;Trunk flexed;Antalgic;Decreased stride length;Decreased stance time - left     General Gait Details: Assistance to progress RW and shift weight.  Cues for sequencing and upper trunk control.   Stairs             Wheelchair Mobility    Modified Rankin (Stroke Patients Only)        Balance Overall balance assessment: Needs assistance Sitting-balance support: Feet supported Sitting balance-Leahy Scale: Good Sitting balance - Comments: reliant on BUE support, progressing to supervision      Standing balance-Leahy Scale: Poor                              Cognition Arousal/Alertness: Awake/alert Behavior During Therapy: WFL for tasks assessed/performed;Anxious Overall Cognitive Status: Within Functional Limits for tasks assessed                         Following Commands: Follows multi-step commands consistently       General Comments: Pt remains very anxious throughout session      Exercises      General Comments        Pertinent Vitals/Pain Pain Assessment: 0-10 Pain Score: 6  Pain Location: L groin and R hip Pain Descriptors / Indicators: Grimacing;Guarding Pain Intervention(s): Monitored during session;Repositioned    Home Living                      Prior Function            PT Goals (current goals can now be found in the care plan section) Acute Rehab PT Goals Patient Stated Goal: to get better Potential to Achieve Goals: Good Progress towards PT goals: Progressing toward goals    Frequency  Min 3X/week      PT Plan Current plan remains appropriate    Co-evaluation              AM-PAC PT "6 Clicks" Mobility   Outcome Measure  Help needed turning from your back to your side while in a flat bed without using bedrails?: A Lot Help needed moving from lying on your back to sitting on the side of a flat bed without using bedrails?: A Lot Help needed moving to and from a bed to a chair (including a wheelchair)?: A Little Help needed standing up from a chair using your arms (e.g., wheelchair or bedside chair)?: A Little Help needed to walk in hospital room?: A Little Help needed climbing 3-5 steps with a railing? : A Lot 6 Click Score: 15    End of Session Equipment Utilized During  Treatment: Gait belt Activity Tolerance: Patient limited by lethargy Patient left: with call bell/phone within reach;in chair;with chair alarm set Nurse Communication: Mobility status PT Visit Diagnosis: Pain;Other abnormalities of gait and mobility (R26.89);Muscle weakness (generalized) (M62.81) Pain - Right/Left: Left Pain - part of body: Hip     Time: 1207-1228 PT Time Calculation (min) (ACUTE ONLY): 21 min  Charges:  $Gait Training: 8-22 mins                     Erasmo Leventhal , PTA Acute Rehabilitation Services Pager [phone] Office [phone]     Dakota Vanwart Eli Hose 05/16/2020, 1:48 PM

## 2020-05-16 NOTE — Progress Notes (Signed)
Discharge inst and report given to facilty Inez Catalina)

## 2020-05-16 NOTE — Plan of Care (Signed)
°  Problem: Education: Goal: Knowledge of General Education information will improve Description: Including pain rating scale, medication(s)/side effects and non-pharmacologic comfort measures 05/16/2020 1254 by Melina Schools, RN Outcome: Adequate for Discharge 05/16/2020 0802 by Melina Schools, RN Outcome: Progressing   Problem: Health Behavior/Discharge Planning: Goal: Ability to manage health-related needs will improve Outcome: Adequate for Discharge   Problem: Clinical Measurements: Goal: Ability to maintain clinical measurements within normal limits will improve Outcome: Adequate for Discharge Goal: Will remain free from infection Outcome: Adequate for Discharge Goal: Diagnostic test results will improve Outcome: Adequate for Discharge Goal: Respiratory complications will improve Outcome: Adequate for Discharge Goal: Cardiovascular complication will be avoided Outcome: Adequate for Discharge   Problem: Activity: Goal: Risk for activity intolerance will decrease Outcome: Adequate for Discharge   Problem: Nutrition: Goal: Adequate nutrition will be maintained Outcome: Adequate for Discharge   Problem: Coping: Goal: Level of anxiety will decrease Outcome: Adequate for Discharge   Problem: Elimination: Goal: Will not experience complications related to bowel motility Outcome: Adequate for Discharge Goal: Will not experience complications related to urinary retention Outcome: Adequate for Discharge   Problem: Pain Managment: Goal: General experience of comfort will improve 05/16/2020 1254 by Melina Schools, RN Outcome: Adequate for Discharge 05/16/2020 0802 by Melina Schools, RN Outcome: Progressing   Problem: Safety: Goal: Ability to remain free from injury will improve 05/16/2020 1254 by Melina Schools, RN Outcome: Adequate for Discharge 05/16/2020 0802 by Melina Schools, RN Outcome: Progressing   Problem: Skin Integrity: Goal: Risk for  impaired skin integrity will decrease Outcome: Adequate for Discharge

## 2020-05-16 NOTE — TOC Transition Note (Signed)
Transition of Care [facility]) - CM/SW Discharge Note   Patient Details  Name: NARY SNEED MRN: [id number] Date of Birth: [birth year]  [facility] CM/SW Contact:  Sharin Mons, RN Phone Number: 05/16/2020, 11:48 AM   Clinical Narrative:     Patient will DC to: [facility] Anticipated DC date: 05/16/2020 Family notified: [name]( friend), [phone] Transport by: Corey Harold   Per MD patient ready for DC today . RN, patient, patient's family, and facility notified of DC. Discharge Summary and FL2 sent to facility. RN to call report prior to discharge [phone]). DC packet on chart. Ambulance transport requested for patient.   RNCM will sign off for now as intervention is no longer needed. Please consult [place] again if new needs arise.    Final next level of care: Skilled Nursing Facility Barriers to Discharge: No Barriers Identified   Patient Goals and CMS Choice Patient states their goals for this hospitalization and ongoing recovery are:: To return home CMS Medicare.gov Compare Post Acute Care list provided to:: Patient Choice offered to / list presented to : Patient  Discharge Placement                       Discharge Plan and Services                                     Social Determinants of Health (SDOH) Interventions     Readmission Risk Interventions Readmission Risk Prevention Plan 03/04/2019  Post Dischage Appt Not Complete  Medication Screening Complete  Transportation Screening Complete  Some recent data might be hidden

## 2020-05-16 NOTE — Progress Notes (Signed)
Subjective:  ON Events: None  Tina Mercado was seen and evaluated at bedside this AM. She reports feeling well but still complains of poor appetite. No nausea today. We discussed continuing to find placement for her SNF. Patient voices understanding.   Objective:  Vital signs in last 24 hours: Vitals:   05/15/20 1539 05/15/20 2041 05/15/20 2141 05/16/20 0351  BP:   (!) 150/61 (!) 156/65  Pulse:   87 67  Resp:   20 17  Temp:   98 F (36.7 C) 98.5 F (36.9 C)  TempSrc:   Oral Oral  SpO2: 90% 94% 98% 96%  Weight:      Height:       Physical Exam Vitals and nursing note reviewed.  Constitutional:      General: She is not in acute distress.    Appearance: Normal appearance. She is not ill-appearing or toxic-appearing.  HENT:     Head: Normocephalic and atraumatic.  Cardiovascular:     Rate and Rhythm: Normal rate and regular rhythm.     Pulses: Normal pulses.     Heart sounds: Normal heart sounds. No murmur heard.  No friction rub. No gallop.   Pulmonary:     Effort: Pulmonary effort is normal.     Breath sounds: Normal breath sounds. No wheezing, rhonchi or rales.  Abdominal:     General: Bowel sounds are normal.     Palpations: Abdomen is soft.     Tenderness: There is no abdominal tenderness. There is no guarding.  Neurological:     Mental Status: She is alert.    LABS:  CBC Latest Ref Rng & Units 05/15/2020 05/13/2020 05/11/2020  WBC 4.0 - 10.5 K/uL 7.1 8.8 13.0(H)  Hemoglobin 12.0 - 15.0 g/dL 10.4(L) 10.3(L) 12.7  Hematocrit 36 - 46 % 32.1(L) 32.1(L) 39.4  Platelets 150 - 400 K/uL 166 120(L) 177   BMP Latest Ref Rng & Units 05/16/2020 05/15/2020 05/14/2020  Glucose 70 - 99 mg/dL 107(H) 113(H) 97  BUN 8 - 23 mg/dL 16 17 12   Creatinine 0.44 - 1.00 mg/dL 0.70 0.76 0.74  BUN/Creat Ratio 12 - 28 - - -  Sodium 135 - 145 mmol/L 141 140 139  Potassium 3.5 - 5.1 mmol/L 3.7 3.3(L) 3.5  Chloride 98 - 111 mmol/L 111 110 109  CO2 22 - 32 mmol/L 21(L) 22 21(L)  Calcium  8.9 - 10.3 mg/dL 8.4(L) 8.1(L) 8.2(L)   Assessment/Plan: Principal Problem:   Nondisplaced fracture of left inferior pubic rami Active Problems:   Pure hypercholesterolemia   Osteoporosis   COPD mixed type ([place])   Hypertension   Adult failure to thrive syndrome   NAGMA   Hip fracture (HCC)   Closed fracture of left inferior pubic ramus ([place])   Fall   Tina C Honeycuttis a 86 y.o.femalewith hx of osteoporosis, left femur fracture s/p intramedullary nail in 2020, COPD 2/2 environmental exposure, HTN, HLD, hearing difficultypresenting with L inferior pubic rami fracture.  Left Pubic Rami fracture following mechanical fall Stress fracture Pending SNF placement  -continuetylenol 650mg  q6 prn -continueoxycodone 5mg  q4 prn -per ortho, weight bearing as tolerated -f/u with ortho in 3 weeks with Dr. Doreatha Martin  Protein Calorie Malnutrition She requested for Boost nutritional supplements  -Regular diet with protein supplements  Hypoproliferative Normocytic Iron Deficiency Anemia: -s/p Feraheme x1 dose  Urinary Retention: She has been voiding without difficulties -Bladder scans BID, in and out as needed  COPD due to environmental exposure Patient not complaining of shortness  of breath, currently on 1-2L of O2 supplementation. Ambulated without O2 saturating at 88% and into the mid 90s with 2L of O2 supplementation.  - Home O2 ordered.  -Dulera BID -Duo-neb prn   HLD -Continue crestor 10mg  QD  Systolic heart murmur best heard at apex Echo from 01/2020 with mild mitral regurgitation. Followed by cardiology  Hypertension: - Amlodipine 5 mg QD  ChronicNonAnion-gap metabolic acidosis-unclear etiology -f/u BMP daily  Prerenal Azotemia: Resolved  Diet: Regular diet with Ensure supplementation IVF:LR at 75 mL/hour SMM:OCAREQJEADGN heparin Prior to Admission Living Arrangement: ALF Anticipated Discharge Location:SNF Barriers to Discharge: SNF  placement Dispo: Anticipated discharge pending SNF placement.  Tina Mercury, MD 05/16/2020, 5:54 AM Pager: [phone] After 5pm on weekdays and 1pm on weekends: On Call pager [phone]

## 2020-05-16 NOTE — Progress Notes (Signed)
  Date: 05/16/2020  Patient name: [name]  Medical record number: [id number]  Date of birth: [birth year]        I have seen and evaluated this patient and I have discussed the plan of care with the house staff. Please see Dr. Gilford Rile' note for complete details. I concur with his findings and plan.    Sid Falcon, MD 05/16/2020, 4:24 PM

## 2020-05-16 NOTE — Plan of Care (Signed)

## 2020-05-16 NOTE — Plan of Care (Signed)
  Problem: Education: Goal: Knowledge of General Education information will improve Description: Including pain rating scale, medication(s)/side effects and non-pharmacologic comfort measures Outcome: Progressing   Problem: Pain Managment: Goal: General experience of comfort will improve Outcome: Progressing   Problem: Safety: Goal: Ability to remain free from injury will improve Outcome: Progressing   

## 2020-05-16 NOTE — Progress Notes (Signed)
Occupational Therapy Treatment Patient Details Name: Tina Mercado MRN: [id number] DOB: [birth year] Today's Date: 05/16/2020    History of present illness Pt is a 86 y.o. F with significant PMH of COPD, hearing loss, glaucoma, osteoporosis, left IMN (2020) who presents after a fall with a left inferior pubic ramus fracture.    OT comments  Pt. Was had increased ability with performing  ADLs and mobility. Pt. cogniton has improved. Pt. Is motivated to increase ability with ADLs and Mobility. Acute OT to follow.   Follow Up Recommendations  SNF;Supervision/Assistance - 24 hour    Equipment Recommendations  Other (comment)    Recommendations for Other Services      Precautions / Restrictions Precautions Precautions: Fall Restrictions RLE Weight Bearing: Weight bearing as tolerated LLE Weight Bearing: Weight bearing as tolerated       Mobility Bed Mobility         Supine to sit: Min assist        Transfers Overall transfer level: Needs assistance Equipment used: Rolling walker (2 wheeled)   Sit to Stand: Min assist         General transfer comment: cues for proper hand placement    Balance     Sitting balance-Leahy Scale: Good                                     ADL either performed or assessed with clinical judgement   ADL Overall ADL's : Needs assistance/impaired Eating/Feeding: Independent   Grooming: Wash/dry hands;Wash/dry face;Oral care;Applying deodorant;Brushing hair;Set up;Supervision/safety   Upper Body Bathing: Supervision/ safety;Set up;Sitting   Lower Body Bathing: Moderate assistance;Sit to/from stand   Upper Body Dressing : Minimal assistance;Sitting   Lower Body Dressing: Moderate assistance;Sit to/from stand   Toilet Transfer: Minimal assistance;BSC;RW   Toileting- Clothing Manipulation and Hygiene: Minimal assistance;Sit to/from stand       Functional mobility during ADLs: Minimal assistance;Rolling  walker General ADL Comments: Pt. uses AE at home.      Vision   Vision Assessment?: No apparent visual deficits   Perception     Praxis      Cognition Arousal/Alertness: Awake/alert Behavior During Therapy: WFL for tasks assessed/performed Overall Cognitive Status: Within Functional Limits for tasks assessed                         Following Commands: Follows multi-step commands consistently       General Comments: Pt. states she is much clearer than she was before.         Exercises     Shoulder Instructions       General Comments      Pertinent Vitals/ Pain       Pain Assessment: 0-10 Pain Score: 4  Pain Intervention(s): Monitored during session;Patient requesting pain meds-RN notified  Home Living                                          Prior Functioning/Environment              Frequency  Min 2X/week        Progress Toward Goals  OT Goals(current goals can now be found in the care plan section)  Progress towards OT goals: Progressing toward goals  Acute Rehab OT Goals Patient Stated  Goal: to get better OT Goal Formulation: With patient Time For Goal Achievement: 05/26/20 Potential to Achieve Goals: Good ADL Goals Pt Will Perform Grooming: sitting;with modified independence Pt Will Transfer to Toilet: stand pivot transfer;bedside commode;with supervision Pt Will Perform Toileting - Clothing Manipulation and hygiene: sit to/from stand;with supervision Additional ADL Goal #1: Pt will perform bed mobility with mod i  in preparation for ADL.  Plan      Co-evaluation                 AM-PAC OT "6 Clicks" Daily Activity     Outcome Measure   Help from another person eating meals?: None Help from another person taking care of personal grooming?: A Little Help from another person toileting, which includes using toliet, bedpan, or urinal?: A Lot Help from another person bathing (including washing, rinsing,  drying)?: A Lot Help from another person to put on and taking off regular upper body clothing?: A Little Help from another person to put on and taking off regular lower body clothing?: A Lot 6 Click Score: 16    End of Session Equipment Utilized During Treatment: Gait belt;Rolling walker  OT Visit Diagnosis: Unsteadiness on feet (R26.81);Pain;Muscle weakness (generalized) (M62.81);Other symptoms and signs involving cognitive function   Activity Tolerance Patient tolerated treatment well   Patient Left in chair;with call bell/phone within reach;with chair alarm set   Nurse Communication Patient requests pain meds        Time: 0347-4259 OT Time Calculation (min): 62 min  Charges: OT General Charges $OT Visit: 1 Visit OT Treatments $Self Care/Home Management : 53-67 mins  Reece Packer OT/L   Julion Gatt 05/16/2020, 11:29 AM

## 2020-05-17 ENCOUNTER — Non-Acute Institutional Stay (SKILLED_NURSING_FACILITY): Payer: Medicare Other | Admitting: Nurse Practitioner

## 2020-05-17 ENCOUNTER — Encounter: Payer: Self-pay | Admitting: Nurse Practitioner

## 2020-05-17 DIAGNOSIS — K5901 Slow transit constipation: Secondary | ICD-10-CM | POA: Insufficient documentation

## 2020-05-17 DIAGNOSIS — I10 Essential (primary) hypertension: Secondary | ICD-10-CM

## 2020-05-17 DIAGNOSIS — G629 Polyneuropathy, unspecified: Secondary | ICD-10-CM | POA: Diagnosis not present

## 2020-05-17 DIAGNOSIS — J449 Chronic obstructive pulmonary disease, unspecified: Secondary | ICD-10-CM | POA: Diagnosis not present

## 2020-05-17 DIAGNOSIS — S72002D Fracture of unspecified part of neck of left femur, subsequent encounter for closed fracture with routine healing: Secondary | ICD-10-CM | POA: Diagnosis not present

## 2020-05-17 DIAGNOSIS — K219 Gastro-esophageal reflux disease without esophagitis: Secondary | ICD-10-CM | POA: Diagnosis not present

## 2020-05-17 DIAGNOSIS — I119 Hypertensive heart disease without heart failure: Secondary | ICD-10-CM | POA: Diagnosis not present

## 2020-05-17 DIAGNOSIS — H04123 Dry eye syndrome of bilateral lacrimal glands: Secondary | ICD-10-CM | POA: Diagnosis not present

## 2020-05-17 NOTE — Assessment & Plan Note (Addendum)
Persisted hacking cough, SOB, no productive, denied chest pain/pressure, or palpitation, continue O2, repeat CXR, adding schedule DuoNeb bid x 3 days, continue Advair, prn DuoNeb.

## 2020-05-17 NOTE — Assessment & Plan Note (Signed)
No apparent fluid retention, continue observing.

## 2020-05-17 NOTE — Assessment & Plan Note (Signed)
Pain in the left hip, leg, non displaced fx of left inferior pubic rami, left hip/leg pain, prn Oxycodone, prn Ibuprofen, Tylenol prn.

## 2020-05-17 NOTE — Assessment & Plan Note (Signed)
Start Colace 200mg  qd

## 2020-05-17 NOTE — Assessment & Plan Note (Signed)
Artificial tears 2gtt q2hr prn OU

## 2020-05-17 NOTE — Assessment & Plan Note (Signed)
Takes Amitriptyline.

## 2020-05-17 NOTE — Assessment & Plan Note (Signed)
Blood pressure is controlled, continue Amlodipine.  
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
## 2020-05-17 NOTE — Assessment & Plan Note (Signed)
Stable, continue Esomeprazole.  

## 2020-05-18 ENCOUNTER — Encounter: Payer: Self-pay | Admitting: Nurse Practitioner

## 2020-05-25 ENCOUNTER — Telehealth: Payer: Self-pay | Admitting: Cardiology

## 2020-05-25 NOTE — Telephone Encounter (Signed)
Echo faxed to the number provided through [place].

## 2020-05-25 NOTE — Telephone Encounter (Signed)
Tanika from [facility] was requesting a copy of the patient's Echo results from 02/24/20. Please fax results to [phone]

## 2020-05-31 ENCOUNTER — Non-Acute Institutional Stay (SKILLED_NURSING_FACILITY): Payer: Medicare Other | Admitting: Nurse Practitioner

## 2020-05-31 ENCOUNTER — Encounter: Payer: Self-pay | Admitting: Nurse Practitioner

## 2020-05-31 DIAGNOSIS — I119 Hypertensive heart disease without heart failure: Secondary | ICD-10-CM | POA: Diagnosis not present

## 2020-05-31 DIAGNOSIS — J449 Chronic obstructive pulmonary disease, unspecified: Secondary | ICD-10-CM

## 2020-05-31 DIAGNOSIS — S32592A Other specified fracture of left pubis, initial encounter for closed fracture: Secondary | ICD-10-CM | POA: Diagnosis not present

## 2020-05-31 DIAGNOSIS — Z862 Personal history of diseases of the blood and blood-forming organs and certain disorders involving the immune mechanism: Secondary | ICD-10-CM

## 2020-05-31 DIAGNOSIS — K219 Gastro-esophageal reflux disease without esophagitis: Secondary | ICD-10-CM | POA: Diagnosis not present

## 2020-05-31 DIAGNOSIS — N390 Urinary tract infection, site not specified: Secondary | ICD-10-CM | POA: Insufficient documentation

## 2020-05-31 DIAGNOSIS — I10 Essential (primary) hypertension: Secondary | ICD-10-CM

## 2020-05-31 DIAGNOSIS — G629 Polyneuropathy, unspecified: Secondary | ICD-10-CM
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
## 2020-05-31 NOTE — Assessment & Plan Note (Signed)
occasional hacking cough, off O2, CXR 04/13/20 COPD.  Continue Advair bid, prn DuoNeb

## 2020-05-31 NOTE — Assessment & Plan Note (Signed)
Blood pressure is controlled, continue Amlodipine.  
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
## 2020-05-31 NOTE — Assessment & Plan Note (Signed)
Stable, continue Esomeprazole.  

## 2020-05-31 NOTE — Assessment & Plan Note (Addendum)
c/o burning sensation upon urinary since admission per nurse report, reported foul odor urine 05/30/20. Denied nausea, vomiting, abd pain, she is afebrile. Will update CBC/diff, CMP/eGFR, UA C/S 06/01/20 urine culture E Coli, 50,000-100,000c/mo, susceptible to cephalosporins, the patient desires Keflex 546m bid x 7 days.

## 2020-05-31 NOTE — Assessment & Plan Note (Signed)
Stable, continue Amitriptyline.  

## 2020-05-31 NOTE — Assessment & Plan Note (Signed)
Hx of CHF, no apparent edema or moist rales in lungs. EF 65-70% % 02/24/20

## 2020-05-31 NOTE — Assessment & Plan Note (Signed)
Baseline Hgb 10s 

## 2020-06-02 LAB — HEPATIC FUNCTION PANEL
ALT: 5 — AB (ref 7–35)
AST: 12 — AB (ref 13–35)
Alkaline Phosphatase: 226 — AB (ref 25–125)
Bilirubin, Total: 0.5

## 2020-06-02 LAB — COMPREHENSIVE METABOLIC PANEL
Albumin: 4.2 (ref 3.5–5.0)
Calcium: 9.5 (ref 8.7–10.7)
Globulin: 3

## 2020-06-02 LAB — CBC AND DIFFERENTIAL
HCT: 41 (ref 36–46)
Hemoglobin: 13.1 (ref 12.0–16.0)
Neutrophils Absolute: 7714
Platelets: 275 (ref 150–399)
WBC: 9.5

## 2020-06-02 LAB — BASIC METABOLIC PANEL
BUN: 13 (ref 4–21)
CO2: 22 (ref 13–22)
Chloride: 104 (ref 99–108)
Creatinine: 0.9 (ref 0.5–1.1)
Glucose: 132
Potassium: 4 (ref 3.4–5.3)
Sodium: 140 (ref 137–147)

## 2020-06-02 LAB — CBC: RBC: 4.59 (ref 3.87–5.11)

## 2020-06-05 DIAGNOSIS — S72002D Fracture of unspecified part of neck of left femur, subsequent encounter for closed fracture with routine healing: Secondary | ICD-10-CM | POA: Diagnosis not present

## 2020-06-05 DIAGNOSIS — S32502A Unspecified fracture of left pubis, initial encounter for closed fracture: Secondary | ICD-10-CM | POA: Diagnosis not present

## 2020-06-06 ENCOUNTER — Telehealth: Payer: Self-pay | Admitting: Cardiology

## 2020-06-06 NOTE — Telephone Encounter (Signed)
Pt c/o swelling: STAT is pt has developed SOB within 24 hours  1) How much weight have you gained and in what time span? No weight gain  2) If swelling, where is the swelling located? Feet and ankles  3) Are you currently taking a fluid pill? Yes   4) Are you currently SOB? No  5) Do you have a log of your daily weights (if so, list)? No log available  6) Have you gained 3 pounds in a day or 5 pounds in a week? No  7) Have you traveled recently? No

## 2020-06-06 NOTE — Telephone Encounter (Signed)
Attempted to contact patient- she did not have good service where she was to get any information.   I asked if I could call her at home, but she was not at home from what I could gather.   Will attempt at another time, once patient is back in an area to discuss.

## 2020-06-08 NOTE — Telephone Encounter (Signed)
Spoke with pt, she has noticed swelling in her feet and ankles since her fall 4 weeks ago. It is down some in the morning but not completely gone. Her weight is stable, she may have lost 3 lbs. She has no SOB or orthopnea. Her blood pressure was up during her hospital stay and she was started on amlodipine 5 mg once daily. She feels this maybe contributing to her edema. Her blood pressure has been running normal by her report but she can nit give me any numbers. She currently is also being treated for a UTI. She reports she has taken HCTZ in the past for elevated bp and it worked well for her. Aware dr Stanford Breed is out of the office but will forward for his review and advise.

## 2020-06-08 NOTE — Telephone Encounter (Signed)
DC amlodipine; HCTZ 12.5 mg daily; bmet one week; follow BP Kirk Ruths

## 2020-06-12 ENCOUNTER — Non-Acute Institutional Stay (SKILLED_NURSING_FACILITY): Payer: Medicare Other | Admitting: Internal Medicine

## 2020-06-12 ENCOUNTER — Encounter: Payer: Self-pay | Admitting: Internal Medicine

## 2020-06-12 DIAGNOSIS — S32592A Other specified fracture of left pubis, initial encounter for closed fracture: Secondary | ICD-10-CM

## 2020-06-12 DIAGNOSIS — N390 Urinary tract infection, site not specified: Secondary | ICD-10-CM | POA: Diagnosis not present

## 2020-06-12 DIAGNOSIS — I10 Essential (primary) hypertension: Secondary | ICD-10-CM

## 2020-06-12 DIAGNOSIS — R600 Localized edema: Secondary | ICD-10-CM

## 2020-06-12 DIAGNOSIS — Z862 Personal history of diseases of the blood and blood-forming organs and certain disorders involving the immune mechanism: Secondary | ICD-10-CM

## 2020-06-12 DIAGNOSIS — G629 Polyneuropathy, unspecified: Secondary | ICD-10-CM

## 2020-06-12 DIAGNOSIS — J449 Chronic obstructive pulmonary disease, unspecified: Secondary | ICD-10-CM

## 2020-06-12 DIAGNOSIS — K219 Gastro-esophageal reflux disease without esophagitis: Secondary | ICD-10-CM

## 2020-06-12 LAB — BRAIN NATRIURETIC PEPTIDE: B Natriuretic Peptide: 86
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
## 2020-06-12 NOTE — Telephone Encounter (Signed)
Left message for pt to call.

## 2020-06-13 NOTE — Telephone Encounter (Signed)
Left message for pt to call.

## 2020-06-20 ENCOUNTER — Non-Acute Institutional Stay (SKILLED_NURSING_FACILITY): Payer: Medicare Other | Admitting: Nurse Practitioner

## 2020-06-20 ENCOUNTER — Encounter: Payer: Self-pay | Admitting: Nurse Practitioner

## 2020-06-20 DIAGNOSIS — H5410 Blindness, one eye, low vision other eye, unspecified eyes: Secondary | ICD-10-CM

## 2020-06-20 DIAGNOSIS — Z862 Personal history of diseases of the blood and blood-forming organs and certain disorders involving the immune mechanism: Secondary | ICD-10-CM

## 2020-06-20 DIAGNOSIS — G629 Polyneuropathy, unspecified: Secondary | ICD-10-CM | POA: Diagnosis not present

## 2020-06-20 DIAGNOSIS — J449 Chronic obstructive pulmonary disease, unspecified: Secondary | ICD-10-CM | POA: Diagnosis not present

## 2020-06-20 DIAGNOSIS — I119 Hypertensive heart disease without heart failure: Secondary | ICD-10-CM | POA: Diagnosis not present

## 2020-06-20 DIAGNOSIS — H04123 Dry eye syndrome of bilateral lacrimal glands: Secondary | ICD-10-CM | POA: Diagnosis not present

## 2020-06-20 DIAGNOSIS — R609 Edema, unspecified: Secondary | ICD-10-CM | POA: Insufficient documentation

## 2020-06-20 DIAGNOSIS — R42 Dizziness and giddiness: Secondary | ICD-10-CM | POA: Diagnosis not present

## 2020-06-20 DIAGNOSIS — N390 Urinary tract infection, site not specified: Secondary | ICD-10-CM

## 2020-06-20 DIAGNOSIS — H409 Unspecified glaucoma: Secondary | ICD-10-CM

## 2020-06-20 DIAGNOSIS — K449 Diaphragmatic hernia without obstruction or gangrene: Secondary | ICD-10-CM | POA: Diagnosis not present

## 2020-06-20 NOTE — Assessment & Plan Note (Signed)
Stable, Hgb 13.1 06/02/20

## 2020-06-20 NOTE — Assessment & Plan Note (Signed)
Edema BLE, off Amlodipine, too soon to eval? BNP 86 05/22/20, stable weight #122Ibs x2.

## 2020-06-20 NOTE — Assessment & Plan Note (Signed)
takes Advair, chronic hacking cough ?
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
## 2020-06-20 NOTE — Assessment & Plan Note (Signed)
06/01/20 urine culture E Coli 50,000-100,000c/ml, treated with 7 day course of Keflex 8/25/211 the patient stated still experiencing burning sensation upon urination, will stat UA C/S to r/o UTI. May consider Urology consultation as outpatient if no better.

## 2020-06-20 NOTE — Assessment & Plan Note (Addendum)
Blood pressure is controlled, off Amlodipine, persisted mild edema BLE, too soon to eval? BNP 86 05/22/20, stable weight #122Ibs x2.

## 2020-06-20 NOTE — Assessment & Plan Note (Signed)
Stable, continue Esomeprazole.

## 2020-06-20 NOTE — Assessment & Plan Note (Signed)
Peripheral neuropathy, numbness, burning sensation BLE, takes Amitriptyline

## 2020-06-21 ENCOUNTER — Encounter: Payer: Self-pay | Admitting: Nurse Practitioner

## 2020-06-21 ENCOUNTER — Other Ambulatory Visit: Payer: Self-pay | Admitting: Nurse Practitioner

## 2020-06-21 DIAGNOSIS — S32592D Other specified fracture of left pubis, subsequent encounter for fracture with routine healing: Secondary | ICD-10-CM | POA: Diagnosis not present

## 2020-06-21 DIAGNOSIS — R2681 Unsteadiness on feet: Secondary | ICD-10-CM | POA: Diagnosis not present

## 2020-06-21 DIAGNOSIS — H409 Unspecified glaucoma: Secondary | ICD-10-CM

## 2020-06-21 DIAGNOSIS — N3941 Urge incontinence: Secondary | ICD-10-CM | POA: Diagnosis not present

## 2020-06-21 DIAGNOSIS — M6281 Muscle weakness (generalized): Secondary | ICD-10-CM | POA: Diagnosis not present

## 2020-06-21 DIAGNOSIS — M25552 Pain in left hip: Secondary | ICD-10-CM | POA: Diagnosis not present

## 2020-06-21 MED ORDER — HYPROMELLOSE (GONIOSCOPIC) 2.5 % OP SOLN: 2.0000 [drp] | OPHTHALMIC | 0 refills | Status: DC | PRN

## 2020-06-21 MED ORDER — MECLIZINE HCL 25 MG PO TABS: 25.0000 mg | ORAL_TABLET | Freq: Three times a day (TID) | ORAL | 0 refills | Status: DC | PRN

## 2020-06-21 MED ORDER — ADVAIR HFA 115-21 MCG/ACT IN AERO: 2.0000 | INHALATION_SPRAY | Freq: Two times a day (BID) | RESPIRATORY_TRACT | 0 refills | Status: DC

## 2020-06-21 MED ORDER — KETOROLAC TROMETHAMINE 0.5 % OP SOLN: 1.0000 [drp] | Freq: Two times a day (BID) | OPHTHALMIC | 0 refills | Status: DC

## 2020-06-21 MED ORDER — IPRATROPIUM-ALBUTEROL 0.5-2.5 (3) MG/3ML IN SOLN: 3.0000 mL | Freq: Four times a day (QID) | RESPIRATORY_TRACT | 0 refills | Status: DC | PRN

## 2020-06-21 MED ORDER — TIMOLOL HEMIHYDRATE 0.5 % OP SOLN: 1.0000 [drp] | Freq: Two times a day (BID) | OPHTHALMIC | 0 refills | Status: DC

## 2020-06-21 MED ORDER — ROSUVASTATIN CALCIUM 10 MG PO TABS: 10.0000 mg | ORAL_TABLET | Freq: Every day | ORAL | 0 refills | Status: DC

## 2020-06-21 MED ORDER — AMITRIPTYLINE HCL 10 MG PO TABS: 10.0000 mg | ORAL_TABLET | Freq: Every day | ORAL | 0 refills | Status: DC

## 2020-06-21 NOTE — Telephone Encounter (Signed)
Left message for pt to call.

## 2020-06-21 NOTE — Telephone Encounter (Signed)
Spoke with pt, she stopped the amlodipine on her own and her swelling has gotten some better. She also reports her bp is running 120's/60's and she does not feel she needs the HCTZ now. She has a follow up in October with dr Stanford Breed and will call prior to that appointment if she continues to have swelling.

## 2020-06-26 DIAGNOSIS — M25552 Pain in left hip: Secondary | ICD-10-CM | POA: Diagnosis not present

## 2020-06-26 DIAGNOSIS — S32592D Other specified fracture of left pubis, subsequent encounter for fracture with routine healing: Secondary | ICD-10-CM | POA: Diagnosis not present

## 2020-06-26 DIAGNOSIS — M6281 Muscle weakness (generalized): Secondary | ICD-10-CM | POA: Diagnosis not present

## 2020-06-26 DIAGNOSIS — N3941 Urge incontinence: Secondary | ICD-10-CM | POA: Diagnosis not present

## 2020-06-26 DIAGNOSIS — R2681 Unsteadiness on feet: Secondary | ICD-10-CM | POA: Diagnosis not present

## 2020-06-27 DIAGNOSIS — M6281 Muscle weakness (generalized): Secondary | ICD-10-CM | POA: Diagnosis not present

## 2020-06-27 DIAGNOSIS — N3941 Urge incontinence: Secondary | ICD-10-CM | POA: Diagnosis not present

## 2020-06-27 DIAGNOSIS — S32592D Other specified fracture of left pubis, subsequent encounter for fracture with routine healing: Secondary | ICD-10-CM | POA: Diagnosis not present

## 2020-06-27 DIAGNOSIS — M25552 Pain in left hip: Secondary | ICD-10-CM | POA: Diagnosis not present

## 2020-06-27 DIAGNOSIS — R2681 Unsteadiness on feet: Secondary | ICD-10-CM | POA: Diagnosis not present

## 2020-06-29 DIAGNOSIS — S32592D Other specified fracture of left pubis, subsequent encounter for fracture with routine healing: Secondary | ICD-10-CM | POA: Diagnosis not present

## 2020-06-29 DIAGNOSIS — N3941 Urge incontinence: Secondary | ICD-10-CM | POA: Diagnosis not present

## 2020-06-29 DIAGNOSIS — M25552 Pain in left hip: Secondary | ICD-10-CM | POA: Diagnosis not present

## 2020-06-29 DIAGNOSIS — R2681 Unsteadiness on feet: Secondary | ICD-10-CM | POA: Diagnosis not present

## 2020-06-29 DIAGNOSIS — M6281 Muscle weakness (generalized): Secondary | ICD-10-CM | POA: Diagnosis not present

## 2020-07-02 DIAGNOSIS — N3941 Urge incontinence: Secondary | ICD-10-CM | POA: Diagnosis not present

## 2020-07-02 DIAGNOSIS — S32592D Other specified fracture of left pubis, subsequent encounter for fracture with routine healing: Secondary | ICD-10-CM | POA: Diagnosis not present

## 2020-07-02 DIAGNOSIS — M25552 Pain in left hip: Secondary | ICD-10-CM | POA: Diagnosis not present

## 2020-07-02 DIAGNOSIS — M6281 Muscle weakness (generalized): Secondary | ICD-10-CM | POA: Diagnosis not present

## 2020-07-02 DIAGNOSIS — R2681 Unsteadiness on feet: Secondary | ICD-10-CM | POA: Diagnosis not present

## 2020-07-03 DIAGNOSIS — M25552 Pain in left hip: Secondary | ICD-10-CM | POA: Diagnosis not present

## 2020-07-03 DIAGNOSIS — S32592D Other specified fracture of left pubis, subsequent encounter for fracture with routine healing: Secondary | ICD-10-CM | POA: Diagnosis not present

## 2020-07-03 DIAGNOSIS — N3941 Urge incontinence: Secondary | ICD-10-CM | POA: Diagnosis not present

## 2020-07-03 DIAGNOSIS — M6281 Muscle weakness (generalized): Secondary | ICD-10-CM | POA: Diagnosis not present

## 2020-07-03 DIAGNOSIS — R2681 Unsteadiness on feet: Secondary | ICD-10-CM | POA: Diagnosis not present

## 2020-07-04 DIAGNOSIS — H02401 Unspecified ptosis of right eyelid: Secondary | ICD-10-CM | POA: Diagnosis not present

## 2020-07-04 DIAGNOSIS — Z961 Presence of intraocular lens: Secondary | ICD-10-CM | POA: Diagnosis not present

## 2020-07-04 DIAGNOSIS — Z947 Corneal transplant status: Secondary | ICD-10-CM | POA: Diagnosis not present

## 2020-07-04 DIAGNOSIS — H18519 Endothelial corneal dystrophy, unspecified eye: Secondary | ICD-10-CM | POA: Diagnosis not present

## 2020-07-04 DIAGNOSIS — H2512 Age-related nuclear cataract, left eye: Secondary | ICD-10-CM | POA: Diagnosis not present

## 2020-07-05 DIAGNOSIS — S32592D Other specified fracture of left pubis, subsequent encounter for fracture with routine healing: Secondary | ICD-10-CM | POA: Diagnosis not present

## 2020-07-05 DIAGNOSIS — N3941 Urge incontinence: Secondary | ICD-10-CM | POA: Diagnosis not present

## 2020-07-05 DIAGNOSIS — M6281 Muscle weakness (generalized): Secondary | ICD-10-CM | POA: Diagnosis not present

## 2020-07-05 DIAGNOSIS — M25552 Pain in left hip: Secondary | ICD-10-CM | POA: Diagnosis not present

## 2020-07-05 DIAGNOSIS — R2681 Unsteadiness on feet: Secondary | ICD-10-CM | POA: Diagnosis not present

## 2020-07-06 DIAGNOSIS — M6281 Muscle weakness (generalized): Secondary | ICD-10-CM | POA: Diagnosis not present

## 2020-07-06 DIAGNOSIS — L819 Disorder of pigmentation, unspecified: Secondary | ICD-10-CM | POA: Diagnosis not present

## 2020-07-06 DIAGNOSIS — Z79899 Other long term (current) drug therapy: Secondary | ICD-10-CM | POA: Diagnosis not present

## 2020-07-06 DIAGNOSIS — S32592D Other specified fracture of left pubis, subsequent encounter for fracture with routine healing: Secondary | ICD-10-CM | POA: Diagnosis not present

## 2020-07-06 DIAGNOSIS — N3941 Urge incontinence: Secondary | ICD-10-CM | POA: Diagnosis not present

## 2020-07-09 DIAGNOSIS — S32592D Other specified fracture of left pubis, subsequent encounter for fracture with routine healing: Secondary | ICD-10-CM | POA: Diagnosis not present

## 2020-07-09 DIAGNOSIS — R2681 Unsteadiness on feet: Secondary | ICD-10-CM | POA: Diagnosis not present

## 2020-07-09 DIAGNOSIS — M25552 Pain in left hip: Secondary | ICD-10-CM | POA: Diagnosis not present

## 2020-07-09 DIAGNOSIS — M6281 Muscle weakness (generalized): Secondary | ICD-10-CM | POA: Diagnosis not present

## 2020-07-09 DIAGNOSIS — N3941 Urge incontinence: Secondary | ICD-10-CM | POA: Diagnosis not present

## 2020-07-10 DIAGNOSIS — N3941 Urge incontinence: Secondary | ICD-10-CM | POA: Diagnosis not present

## 2020-07-10 DIAGNOSIS — S32592D Other specified fracture of left pubis, subsequent encounter for fracture with routine healing: Secondary | ICD-10-CM | POA: Diagnosis not present

## 2020-07-10 DIAGNOSIS — R2681 Unsteadiness on feet: Secondary | ICD-10-CM | POA: Diagnosis not present

## 2020-07-10 DIAGNOSIS — M6281 Muscle weakness (generalized): Secondary | ICD-10-CM | POA: Diagnosis not present

## 2020-07-10 DIAGNOSIS — M25552 Pain in left hip: Secondary | ICD-10-CM | POA: Diagnosis not present

## 2020-07-12 DIAGNOSIS — S32592D Other specified fracture of left pubis, subsequent encounter for fracture with routine healing: Secondary | ICD-10-CM | POA: Diagnosis not present

## 2020-07-12 DIAGNOSIS — M6281 Muscle weakness (generalized): Secondary | ICD-10-CM | POA: Diagnosis not present

## 2020-07-12 DIAGNOSIS — N3941 Urge incontinence: Secondary | ICD-10-CM | POA: Diagnosis not present

## 2020-07-12 DIAGNOSIS — M25552 Pain in left hip: Secondary | ICD-10-CM | POA: Diagnosis not present

## 2020-07-12 DIAGNOSIS — R2681 Unsteadiness on feet: Secondary | ICD-10-CM | POA: Diagnosis not present

## 2020-07-16 DIAGNOSIS — N3941 Urge incontinence: Secondary | ICD-10-CM | POA: Diagnosis not present

## 2020-07-16 DIAGNOSIS — R2681 Unsteadiness on feet: Secondary | ICD-10-CM | POA: Diagnosis not present

## 2020-07-16 DIAGNOSIS — M6281 Muscle weakness (generalized): Secondary | ICD-10-CM | POA: Diagnosis not present

## 2020-07-16 DIAGNOSIS — S32592D Other specified fracture of left pubis, subsequent encounter for fracture with routine healing: Secondary | ICD-10-CM | POA: Diagnosis not present

## 2020-07-16 DIAGNOSIS — M25552 Pain in left hip: Secondary | ICD-10-CM | POA: Diagnosis not present

## 2020-07-17 DIAGNOSIS — S32592D Other specified fracture of left pubis, subsequent encounter for fracture with routine healing: Secondary | ICD-10-CM | POA: Diagnosis not present

## 2020-07-17 DIAGNOSIS — R2681 Unsteadiness on feet: Secondary | ICD-10-CM | POA: Diagnosis not present

## 2020-07-17 DIAGNOSIS — N3941 Urge incontinence: Secondary | ICD-10-CM | POA: Diagnosis not present

## 2020-07-17 DIAGNOSIS — M25552 Pain in left hip: Secondary | ICD-10-CM | POA: Diagnosis not present

## 2020-07-17 DIAGNOSIS — M6281 Muscle weakness (generalized): Secondary | ICD-10-CM | POA: Diagnosis not present

## 2020-07-19 DIAGNOSIS — M6281 Muscle weakness (generalized): Secondary | ICD-10-CM | POA: Diagnosis not present

## 2020-07-19 DIAGNOSIS — S32592D Other specified fracture of left pubis, subsequent encounter for fracture with routine healing: Secondary | ICD-10-CM | POA: Diagnosis not present

## 2020-07-19 DIAGNOSIS — N3941 Urge incontinence: Secondary | ICD-10-CM | POA: Diagnosis not present

## 2020-07-19 DIAGNOSIS — M25552 Pain in left hip: Secondary | ICD-10-CM | POA: Diagnosis not present

## 2020-07-19 DIAGNOSIS — R2681 Unsteadiness on feet: Secondary | ICD-10-CM | POA: Diagnosis not present

## 2020-07-23 DIAGNOSIS — R2681 Unsteadiness on feet: Secondary | ICD-10-CM | POA: Diagnosis not present

## 2020-07-23 DIAGNOSIS — N3941 Urge incontinence: Secondary | ICD-10-CM | POA: Diagnosis not present

## 2020-07-23 DIAGNOSIS — M25552 Pain in left hip: Secondary | ICD-10-CM | POA: Diagnosis not present

## 2020-07-23 DIAGNOSIS — S32592D Other specified fracture of left pubis, subsequent encounter for fracture with routine healing: Secondary | ICD-10-CM | POA: Diagnosis not present

## 2020-07-23 DIAGNOSIS — M6281 Muscle weakness (generalized): Secondary | ICD-10-CM | POA: Diagnosis not present

## 2020-07-24 DIAGNOSIS — M6281 Muscle weakness (generalized): Secondary | ICD-10-CM | POA: Diagnosis not present

## 2020-07-24 DIAGNOSIS — R2681 Unsteadiness on feet: Secondary | ICD-10-CM | POA: Diagnosis not present

## 2020-07-24 DIAGNOSIS — N3941 Urge incontinence: Secondary | ICD-10-CM | POA: Diagnosis not present

## 2020-07-24 DIAGNOSIS — M25552 Pain in left hip: Secondary | ICD-10-CM | POA: Diagnosis not present

## 2020-07-24 DIAGNOSIS — S32592D Other specified fracture of left pubis, subsequent encounter for fracture with routine healing: Secondary | ICD-10-CM | POA: Diagnosis not present

## 2020-07-26 DIAGNOSIS — S32592D Other specified fracture of left pubis, subsequent encounter for fracture with routine healing: Secondary | ICD-10-CM | POA: Diagnosis not present

## 2020-07-26 DIAGNOSIS — M25552 Pain in left hip: Secondary | ICD-10-CM | POA: Diagnosis not present

## 2020-07-26 DIAGNOSIS — M6281 Muscle weakness (generalized): Secondary | ICD-10-CM | POA: Diagnosis not present

## 2020-07-26 DIAGNOSIS — R2681 Unsteadiness on feet: Secondary | ICD-10-CM | POA: Diagnosis not present

## 2020-07-26 DIAGNOSIS — N3941 Urge incontinence: Secondary | ICD-10-CM | POA: Diagnosis not present

## 2020-07-27 ENCOUNTER — Other Ambulatory Visit: Payer: Self-pay | Admitting: Nurse Practitioner

## 2020-07-27 DIAGNOSIS — J449 Chronic obstructive pulmonary disease, unspecified: Secondary | ICD-10-CM

## 2020-07-30 DIAGNOSIS — M6281 Muscle weakness (generalized): Secondary | ICD-10-CM | POA: Diagnosis not present

## 2020-07-30 DIAGNOSIS — R2681 Unsteadiness on feet: Secondary | ICD-10-CM | POA: Diagnosis not present

## 2020-07-30 DIAGNOSIS — S32592D Other specified fracture of left pubis, subsequent encounter for fracture with routine healing: Secondary | ICD-10-CM | POA: Diagnosis not present

## 2020-07-30 DIAGNOSIS — M25552 Pain in left hip: Secondary | ICD-10-CM | POA: Diagnosis not present

## 2020-07-31 DIAGNOSIS — R2681 Unsteadiness on feet: Secondary | ICD-10-CM | POA: Diagnosis not present

## 2020-07-31 DIAGNOSIS — M25552 Pain in left hip: Secondary | ICD-10-CM | POA: Diagnosis not present

## 2020-07-31 DIAGNOSIS — S32592D Other specified fracture of left pubis, subsequent encounter for fracture with routine healing: Secondary | ICD-10-CM | POA: Diagnosis not present

## 2020-07-31 DIAGNOSIS — M6281 Muscle weakness (generalized): Secondary | ICD-10-CM | POA: Diagnosis not present

## 2020-08-02 DIAGNOSIS — R2681 Unsteadiness on feet: Secondary | ICD-10-CM | POA: Diagnosis not present

## 2020-08-02 DIAGNOSIS — S32592D Other specified fracture of left pubis, subsequent encounter for fracture with routine healing: Secondary | ICD-10-CM | POA: Diagnosis not present

## 2020-08-02 DIAGNOSIS — M6281 Muscle weakness (generalized): Secondary | ICD-10-CM | POA: Diagnosis not present

## 2020-08-02 DIAGNOSIS — M25552 Pain in left hip: Secondary | ICD-10-CM | POA: Diagnosis not present

## 2020-08-06 DIAGNOSIS — R2681 Unsteadiness on feet: Secondary | ICD-10-CM | POA: Diagnosis not present

## 2020-08-06 DIAGNOSIS — S32592D Other specified fracture of left pubis, subsequent encounter for fracture with routine healing: Secondary | ICD-10-CM | POA: Diagnosis not present

## 2020-08-06 DIAGNOSIS — M6281 Muscle weakness (generalized): Secondary | ICD-10-CM | POA: Diagnosis not present

## 2020-08-06 DIAGNOSIS — M25552 Pain in left hip: Secondary | ICD-10-CM | POA: Diagnosis not present

## 2020-08-07 ENCOUNTER — Other Ambulatory Visit: Payer: Self-pay | Admitting: Nurse Practitioner

## 2020-08-07 DIAGNOSIS — J449 Chronic obstructive pulmonary disease, unspecified: Secondary | ICD-10-CM

## 2020-08-07 DIAGNOSIS — S32592D Other specified fracture of left pubis, subsequent encounter for fracture with routine healing: Secondary | ICD-10-CM | POA: Diagnosis not present

## 2020-08-07 DIAGNOSIS — M25552 Pain in left hip: Secondary | ICD-10-CM | POA: Diagnosis not present

## 2020-08-07 DIAGNOSIS — R2681 Unsteadiness on feet: Secondary | ICD-10-CM | POA: Diagnosis not present

## 2020-08-07 DIAGNOSIS — M6281 Muscle weakness (generalized): Secondary | ICD-10-CM | POA: Diagnosis not present

## 2020-08-08 DIAGNOSIS — Z23 Encounter for immunization: Secondary | ICD-10-CM | POA: Diagnosis not present

## 2020-08-09 DIAGNOSIS — M6281 Muscle weakness (generalized): Secondary | ICD-10-CM | POA: Diagnosis not present

## 2020-08-09 DIAGNOSIS — S32592D Other specified fracture of left pubis, subsequent encounter for fracture with routine healing: Secondary | ICD-10-CM | POA: Diagnosis not present

## 2020-08-09 DIAGNOSIS — M25552 Pain in left hip: Secondary | ICD-10-CM | POA: Diagnosis not present

## 2020-08-09 DIAGNOSIS — R2681 Unsteadiness on feet: Secondary | ICD-10-CM | POA: Diagnosis not present

## 2020-08-13 DIAGNOSIS — M6281 Muscle weakness (generalized): Secondary | ICD-10-CM | POA: Diagnosis not present

## 2020-08-13 DIAGNOSIS — S32592D Other specified fracture of left pubis, subsequent encounter for fracture with routine healing: Secondary | ICD-10-CM | POA: Diagnosis not present

## 2020-08-13 DIAGNOSIS — M25552 Pain in left hip: Secondary | ICD-10-CM | POA: Diagnosis not present

## 2020-08-13 DIAGNOSIS — R2681 Unsteadiness on feet: Secondary | ICD-10-CM | POA: Diagnosis not present

## 2020-08-14 DIAGNOSIS — R2681 Unsteadiness on feet: Secondary | ICD-10-CM | POA: Diagnosis not present

## 2020-08-14 DIAGNOSIS — M25552 Pain in left hip: Secondary | ICD-10-CM | POA: Diagnosis not present

## 2020-08-14 DIAGNOSIS — M6281 Muscle weakness (generalized): Secondary | ICD-10-CM | POA: Diagnosis not present

## 2020-08-14 DIAGNOSIS — S32592D Other specified fracture of left pubis, subsequent encounter for fracture with routine healing: Secondary | ICD-10-CM | POA: Diagnosis not present

## 2020-08-15 DIAGNOSIS — M25552 Pain in left hip: Secondary | ICD-10-CM | POA: Diagnosis not present

## 2020-08-15 DIAGNOSIS — M6281 Muscle weakness (generalized): Secondary | ICD-10-CM | POA: Diagnosis not present

## 2020-08-15 DIAGNOSIS — S32592D Other specified fracture of left pubis, subsequent encounter for fracture with routine healing: Secondary | ICD-10-CM | POA: Diagnosis not present

## 2020-08-15 DIAGNOSIS — R2681 Unsteadiness on feet: Secondary | ICD-10-CM | POA: Diagnosis not present

## 2020-08-20 NOTE — Progress Notes (Signed)
HPI: FU hypertension and MR. H/O mild to moderate MR although previous echos not available. Echocardiogram April 2021 showed normal LV function, mild mitral regurgitation.  Since last seen,  she has dyspnea with more vigorous activities but not routine activities.  No orthopnea, PND, pedal edema, chest pain or syncope.  She has had 2 falls in the past year due to imbalance.  Current Outpatient Medications  Medication Sig Dispense Refill  . acetaminophen (TYLENOL) 325 MG tablet Take 2 tablets (650 mg total) by mouth every 6 (six) hours as needed for mild pain, moderate pain or fever. 120 tablet 0  . ADVAIR HFA 115-21 MCG/ACT inhaler TAKE 2 PUFFS BY MOUTH TWICE A DAY 12 each 1  . amitriptyline (ELAVIL) 10 MG tablet Take 1 tablet (10 mg total) by mouth at bedtime. 30 tablet 0  . aspirin EC 81 MG tablet Take 81 mg by mouth at bedtime.     . Cholecalciferol (VITAMIN D) 50 MCG (2000 UT) CAPS Take 2,000 Units by mouth at bedtime.    . docusate sodium (COLACE) 100 MG capsule Take 200 mg by mouth daily as needed for mild constipation.     Marland Kitchen esomeprazole (NEXIUM) 40 MG capsule TAKE 1 CAPSULE (40 MG TOTAL) BY MOUTH AT BEDTIME. 30 capsule 1  . hydroxypropyl methylcellulose / hypromellose (ISOPTO TEARS / GONIOVISC) 2.5 % ophthalmic solution Place 2 drops into both eyes every 2 (two) hours as needed for dry eyes. 15 mL 0  . ketorolac (ACULAR) 0.5 % ophthalmic solution Place 1 drop into the right eye 2 (two) times daily. 3 mL 0  . meclizine (ANTIVERT) 25 MG tablet Take 1 tablet (25 mg total) by mouth 3 (three) times daily as needed for dizziness. 30 tablet 0  . rosuvastatin (CRESTOR) 10 MG tablet Take 1 tablet (10 mg total) by mouth daily. 30 tablet 0  . timolol (BETIMOL) 0.5 % ophthalmic solution Place 1 drop into both eyes 2 (two) times daily. 10 mL 0   No current facility-administered medications for this visit.     Past Medical History:  Diagnosis Date  . Allergy   . Arrhythmia   . Cataract     . COPD (chronic obstructive pulmonary disease) ([place])   . GERD (gastroesophageal reflux disease)   . Glaucoma   . Hearing loss    does not wear hearing aids  . Heart murmur   . History of anemia   . History of diastolic dysfunction   . History of seasonal allergies   . Hyperlipidemia   . Hypertension   . Large hiatal hernia    see on thoracic spine xray  . Mitral regurgitation    mild to moderate  . Osteoporosis   . Wears glasses    readers    Past Surgical History:  Procedure Laterality Date  . APPENDECTOMY    . BREAST SURGERY    . childbirth     x 1  . CORNEAL TRANSPLANT  july 2007  . EYE SURGERY    . INTRAMEDULLARY (IM) NAIL INTERTROCHANTERIC Left 03/03/2019   Procedure: INTRAMEDULLARY (IM) NAIL INTERTROCHANTRIC;  Surgeon: Shona Needles, MD;  Location: [place];  Service: Orthopedics;  Laterality: Left;  . TONSILLECTOMY AND ADENOIDECTOMY     age 29yrs    Social History   Socioeconomic History  . Marital status: Single    Spouse name: Not on file  . Number of children: Not on file  . Years of education: Not on file  .  Highest education level: Not on file  Occupational History  . Not on file  Tobacco Use  . Smoking status: Never Smoker  . Smokeless tobacco: Never Used  Vaping Use  . Vaping Use: Never used  Substance and Sexual Activity  . Alcohol use: No    Alcohol/week: 0.0 standard drinks  . Drug use: No  . Sexual activity: Never    Birth control/protection: Abstinence, Post-menopausal  Other Topics Concern  . Not on file  Social History Narrative  . Not on file   Social Determinants of Health   Financial Resource Strain:   . Difficulty of Paying Living Expenses: Not on file  Food Insecurity:   . Worried About Charity fundraiser in the Last Year: Not on file  . Ran Out of Food in the Last Year: Not on file  Transportation Needs:   . Lack of Transportation (Medical): Not on file  . Lack of Transportation (Non-Medical): Not on file  Physical  Activity:   . Days of Exercise per Week: Not on file  . Minutes of Exercise per Session: Not on file  Stress:   . Feeling of Stress : Not on file  Social Connections:   . Frequency of Communication with Friends and Family: Not on file  . Frequency of Social Gatherings with Friends and Family: Not on file  . Attends Religious Services: Not on file  . Active Member of Clubs or Organizations: Not on file  . Attends Archivist Meetings: Not on file  . Marital Status: Not on file  Intimate Partner Violence:   . Fear of Current or Ex-Partner: Not on file  . Emotionally Abused: Not on file  . Physically Abused: Not on file  . Sexually Abused: Not on file    Family History  Problem Relation Age of Onset  . Heart disease Mother   . Emphysema Father   . Emphysema Brother   . COPD Brother     ROS: no fevers or chills, productive cough, hemoptysis, dysphasia, odynophagia, melena, hematochezia, dysuria, hematuria, rash, seizure activity, orthopnea, PND, pedal edema, claudication. Remaining systems are negative.  Physical Exam: Well-developed well-nourished in no acute distress.  Skin is warm and dry.  HEENT is normal.  Neck is supple.  Chest is clear to auscultation with normal expansion.  Cardiovascular exam is regular rate and rhythm.  Abdominal exam nontender or distended. No masses palpated. Extremities show no edema. neuro grossly intact   A/P  1 hypertension-patient's blood pressure is controlled.   2 hyperlipidemia-continue statin.  3 mitral regurgitation-mild on most recent echocardiogram.  Kirk Ruths, MD

## 2020-08-24 ENCOUNTER — Encounter: Payer: Self-pay | Admitting: Cardiology

## 2020-08-24 ENCOUNTER — Ambulatory Visit (INDEPENDENT_AMBULATORY_CARE_PROVIDER_SITE_OTHER): Payer: Medicare Other | Admitting: Cardiology

## 2020-08-24 ENCOUNTER — Other Ambulatory Visit: Payer: Self-pay

## 2020-08-24 VITALS — BP 140/80 | HR 60 | Ht 61.0 in | Wt 122.0 lb

## 2020-08-24 DIAGNOSIS — I10 Essential (primary) hypertension: Secondary | ICD-10-CM

## 2020-08-24 DIAGNOSIS — E7800 Pure hypercholesterolemia, unspecified: Secondary | ICD-10-CM

## 2020-08-24 DIAGNOSIS — I340 Nonrheumatic mitral (valve) insufficiency: Secondary | ICD-10-CM

## 2020-08-24 NOTE — Patient Instructions (Signed)

## 2020-09-04 DIAGNOSIS — Z23 Encounter for immunization: Secondary | ICD-10-CM | POA: Diagnosis not present

## 2020-09-26 IMAGING — CT CT CERVICAL SPINE WITHOUT CONTRAST
4 of 6 series · 16 of 33 positions shown, 18 images · non-contrast
Comparison: CT head 11/01/2018

CLINICAL DATA: Fall, head injury

EXAM:
CT HEAD WITHOUT CONTRAST
CT CERVICAL SPINE WITHOUT CONTRAST
TECHNIQUE: Multidetector CT imaging of the head and cervical spine was
performed following the standard protocol without intravenous
contrast. Multiplanar CT image reconstructions of the cervical spine
were also generated.

[Series 6: coronal soft tissue · coronal · 0.30mm/px · 3 of 67 slices shown]
[im 15/67  bone]
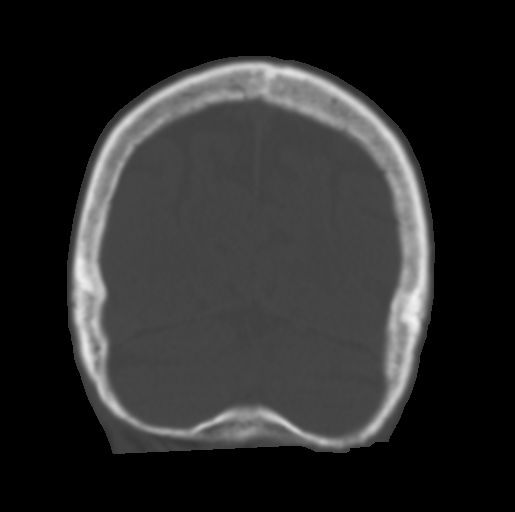
[im 27/67  bone]
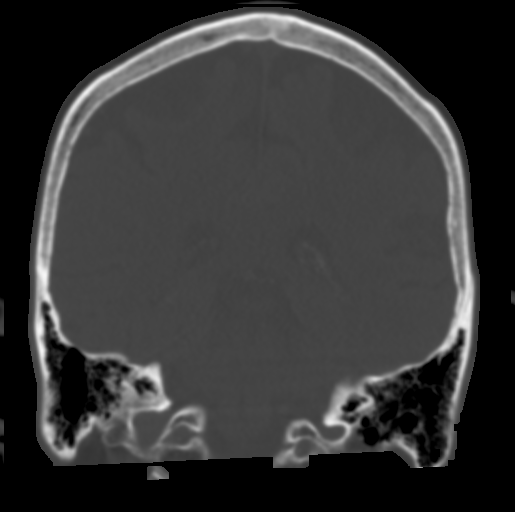
[im 40/67  bone]
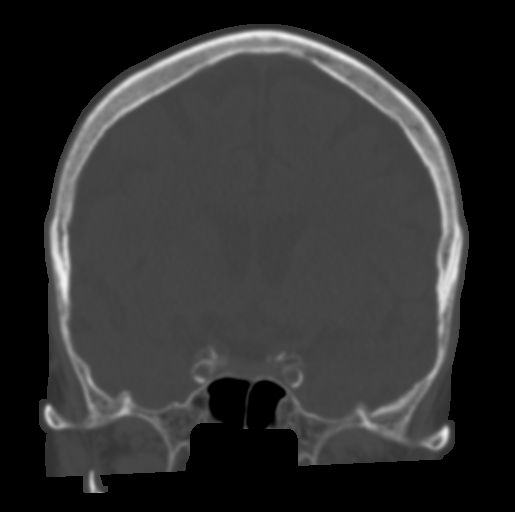

[Series 9: c spine soft · axial · 0.31mm/px · z∈[+228,+308]mm · 3 of 77 slices shown]
[im 20/77  soft-tissue]
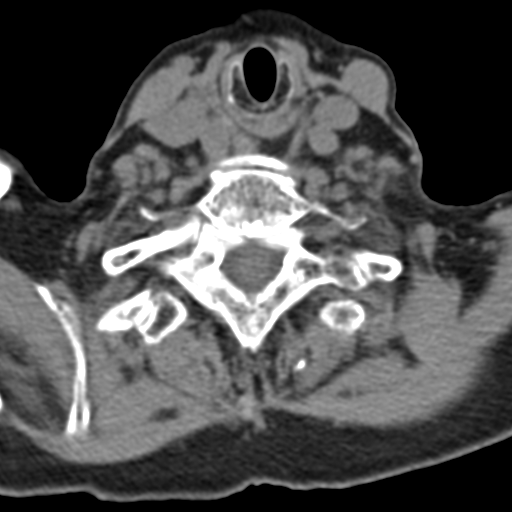
[im 39/77  soft-tissue]
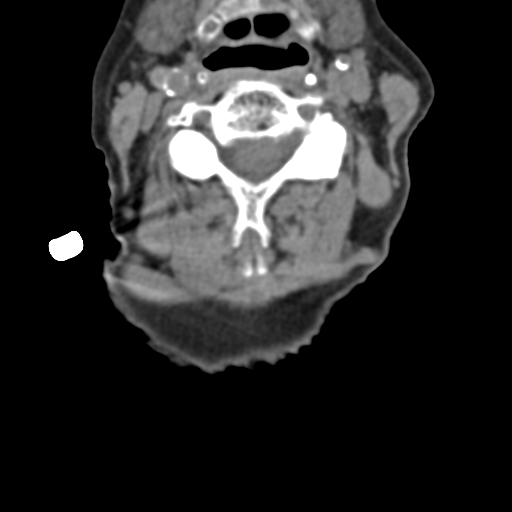
[im 58/77  soft-tissue]
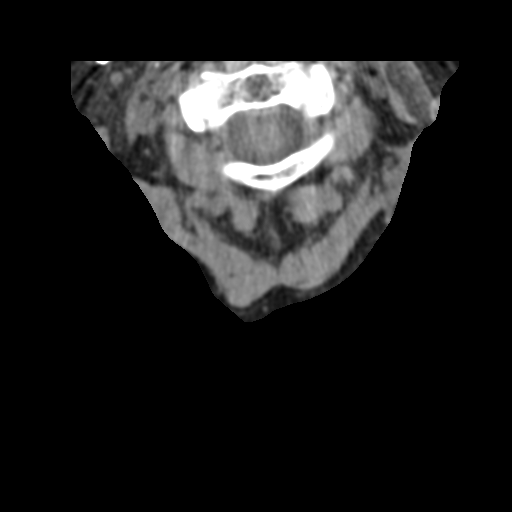

[Series 11: orthogonal bone · axial · 0.23mm/px · z∈[+176,+308]mm · 5 of 110 slices shown, 7 images]
[im 19/110  soft-tissue]
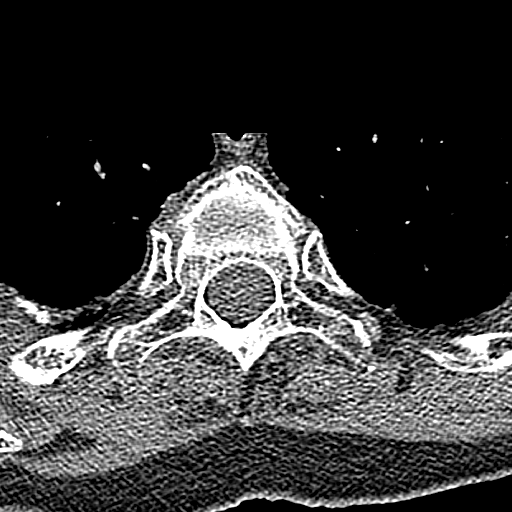
[im 19/110  bone]
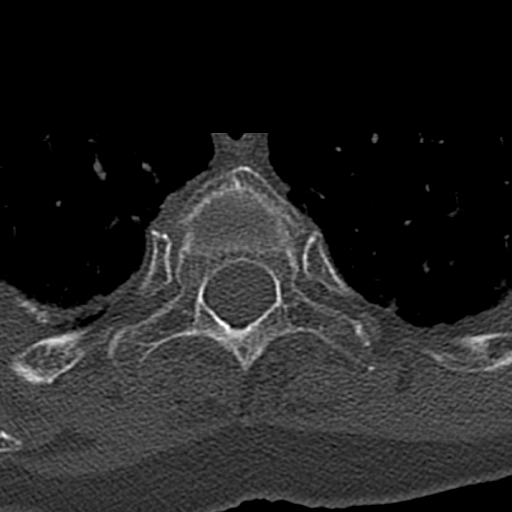
[im 37/110  bone]
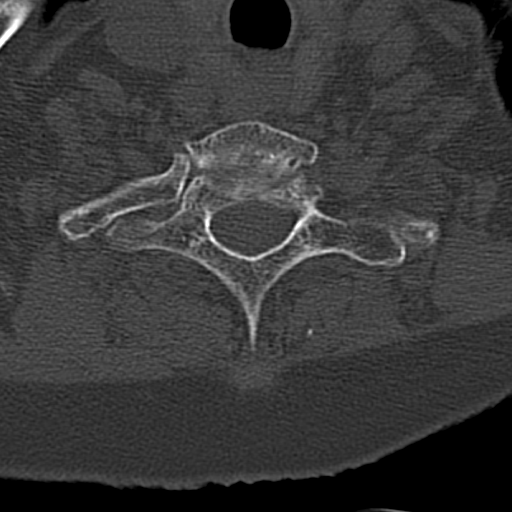
[im 55/110  bone]
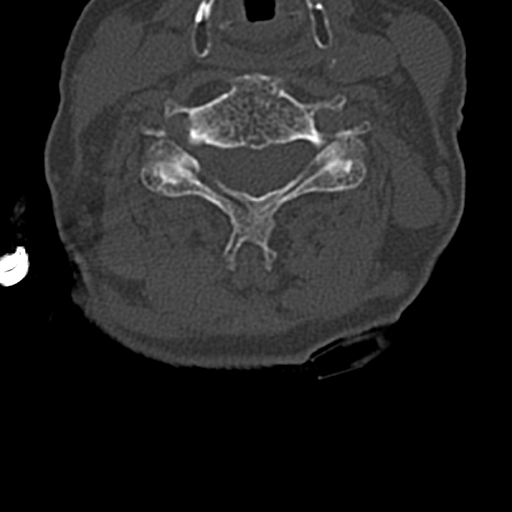
[im 73/110  bone]
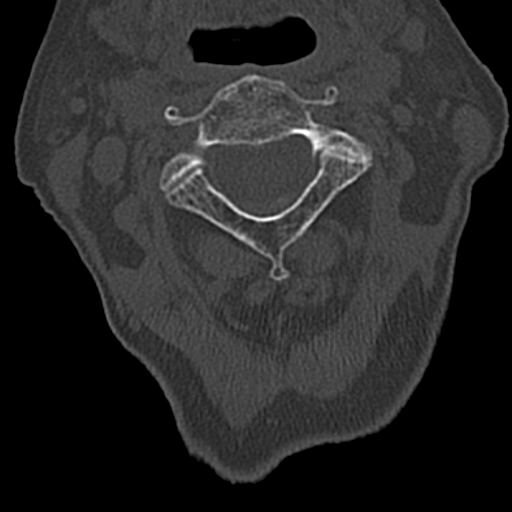
[im 91/110  soft-tissue]
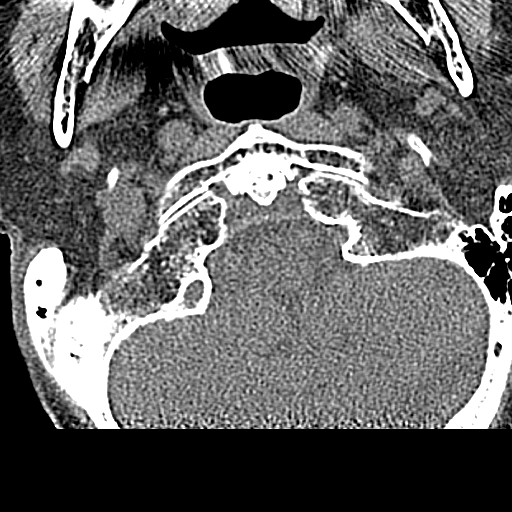
[im 91/110  bone]
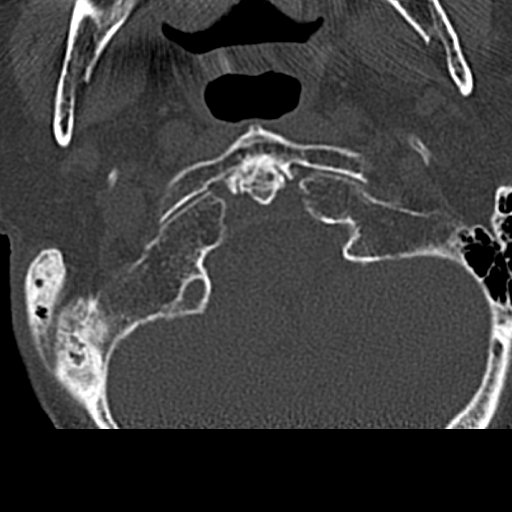

[Series 12: sagittal bone · sagittal · 0.33mm/px · 5 of 61 slices shown]
[im 11/61  bone]
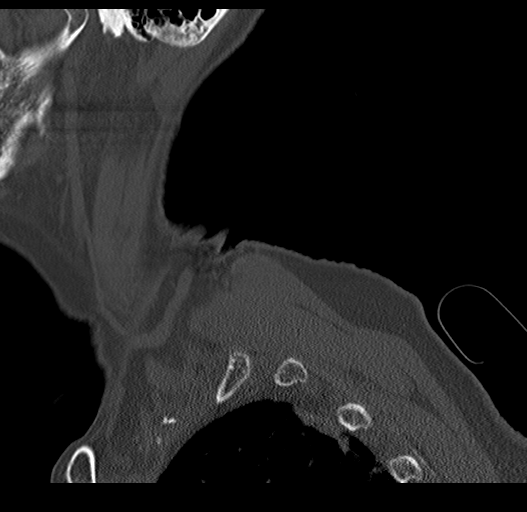
[im 21/61  bone]
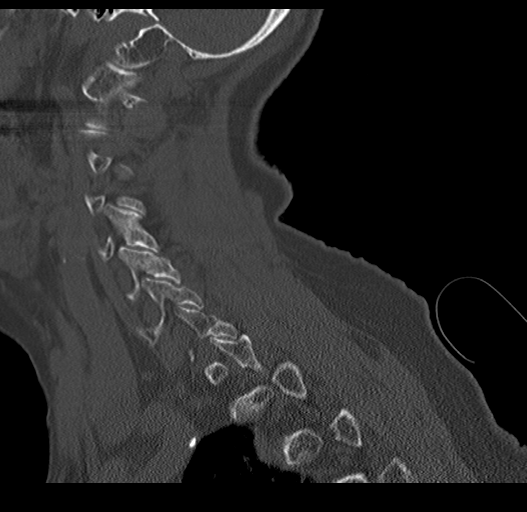
[im 31/61  bone]
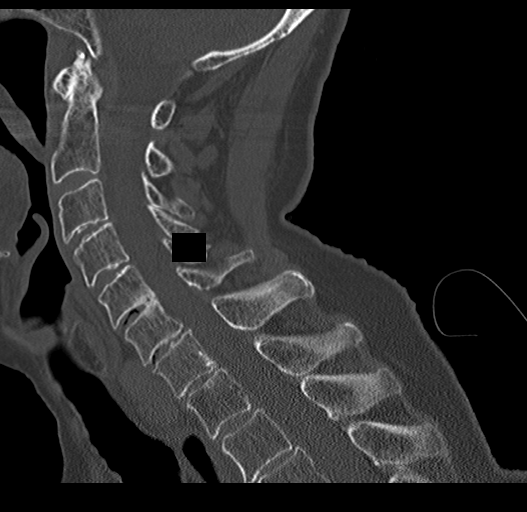
[im 41/61  bone]
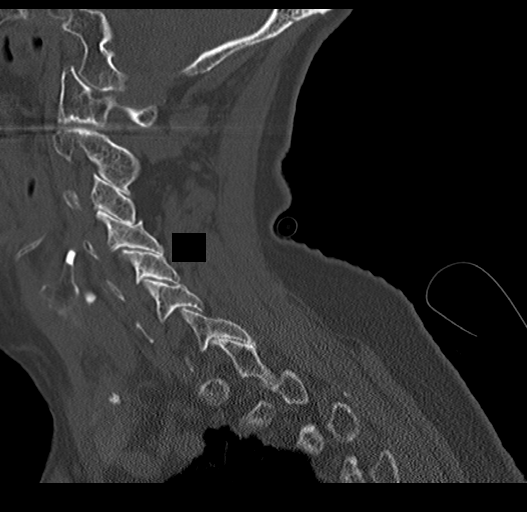
[im 51/61  bone]
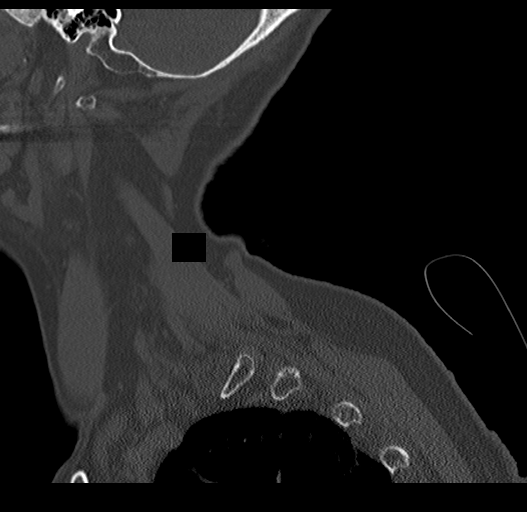

[16 of 33 positions shown; findings below may reference images not displayed]

FINDINGS: CT HEAD FINDINGS

Brain: Mild atrophy. Chronic microvascular ischemic changes in the
white matter. Negative for acute infarct, hemorrhage, or mass.

Vascular: Negative for hyperdense vessel

Skull: Negative for skull fracture.  Right frontal scalp hematoma

Sinuses/Orbits: Paranasal sinuses clear. Right cataract surgery. No
orbital mass.

Other: None

CT CERVICAL SPINE FINDINGS

Alignment: Normal

Skull base and vertebrae: Negative for fracture

Soft tissues and spinal canal: Mild goiter. Atherosclerotic
calcification carotid artery bilaterally. No adenopathy in the neck.

Disc levels: Mild disc degeneration and spurring C3 through C7. No
significant spinal stenosis.

Upper chest: Apically scarring bilaterally without acute abnormality
in the lung apices

Other: None
IMPRESSION: 1. No acute intracranial abnormality. Atrophy and chronic
microvascular ischemia
2. Right frontal scalp hematoma.  Negative for skull fracture
3. Negative for cervical spine fracture.

## 2020-10-11 ENCOUNTER — Other Ambulatory Visit: Payer: Self-pay | Admitting: Nurse Practitioner

## 2020-10-11 DIAGNOSIS — I119 Hypertensive heart disease without heart failure: Secondary | ICD-10-CM

## 2020-10-11 DIAGNOSIS — J449 Chronic obstructive pulmonary disease, unspecified: Secondary | ICD-10-CM

## 2020-10-11 NOTE — Telephone Encounter (Signed)
Patient request refill on medication "Advair 115-21 mcg" and "Crestor 10 mg" Last refill for Advair was 08/07/2020 and Crestor was 06/21/2020 for 30 day supply. I'm not sure if these medications are long term or short term. Medication pend and sent to PCP Virgie Dad, MD . Please Advise.

## 2020-11-03 ENCOUNTER — Other Ambulatory Visit: Payer: Self-pay | Admitting: Internal Medicine

## 2020-11-03 DIAGNOSIS — I119 Hypertensive heart disease without heart failure: Secondary | ICD-10-CM

## 2020-12-12 ENCOUNTER — Other Ambulatory Visit: Payer: Self-pay | Admitting: Internal Medicine

## 2020-12-12 DIAGNOSIS — J449 Chronic obstructive pulmonary disease, unspecified: Secondary | ICD-10-CM

## 2020-12-15 ENCOUNTER — Other Ambulatory Visit: Payer: Self-pay | Admitting: Internal Medicine

## 2020-12-15 DIAGNOSIS — I119 Hypertensive heart disease without heart failure: Secondary | ICD-10-CM

## 2020-12-21 ENCOUNTER — Other Ambulatory Visit: Payer: Self-pay | Admitting: Nurse Practitioner

## 2020-12-21 DIAGNOSIS — H409 Unspecified glaucoma: Secondary | ICD-10-CM

## 2020-12-21 NOTE — Telephone Encounter (Signed)
RX request sent to Virgie Dad, MD to review and approve if necessary.

## 2021-01-02 DIAGNOSIS — Z961 Presence of intraocular lens: Secondary | ICD-10-CM | POA: Diagnosis not present

## 2021-01-02 DIAGNOSIS — H353132 Nonexudative age-related macular degeneration, bilateral, intermediate dry stage: Secondary | ICD-10-CM | POA: Diagnosis not present

## 2021-01-02 DIAGNOSIS — Z947 Corneal transplant status: Secondary | ICD-10-CM | POA: Diagnosis not present

## 2021-01-02 DIAGNOSIS — H2512 Age-related nuclear cataract, left eye: Secondary | ICD-10-CM | POA: Diagnosis not present

## 2021-01-02 DIAGNOSIS — H18512 Endothelial corneal dystrophy, left eye: Secondary | ICD-10-CM | POA: Diagnosis not present

## 2021-01-02 DIAGNOSIS — H02401 Unspecified ptosis of right eyelid: Secondary | ICD-10-CM | POA: Diagnosis not present

## 2021-01-08 ENCOUNTER — Other Ambulatory Visit: Payer: Self-pay | Admitting: Nurse Practitioner

## 2021-01-08 DIAGNOSIS — G629 Polyneuropathy, unspecified: Secondary | ICD-10-CM

## 2021-01-18 ENCOUNTER — Other Ambulatory Visit: Payer: Self-pay | Admitting: Nurse Practitioner

## 2021-01-18 DIAGNOSIS — G629 Polyneuropathy, unspecified: Secondary | ICD-10-CM

## 2021-01-19 ENCOUNTER — Other Ambulatory Visit: Payer: Self-pay | Admitting: Internal Medicine

## 2021-01-19 DIAGNOSIS — H409 Unspecified glaucoma: Secondary | ICD-10-CM

## 2021-01-20 ENCOUNTER — Other Ambulatory Visit: Payer: Self-pay | Admitting: Internal Medicine

## 2021-01-20 DIAGNOSIS — J449 Chronic obstructive pulmonary disease, unspecified: Secondary | ICD-10-CM

## 2021-02-04 DIAGNOSIS — R202 Paresthesia of skin: Secondary | ICD-10-CM | POA: Diagnosis not present

## 2021-02-04 DIAGNOSIS — R7303 Prediabetes: Secondary | ICD-10-CM | POA: Diagnosis not present

## 2021-02-04 DIAGNOSIS — N3941 Urge incontinence: Secondary | ICD-10-CM | POA: Diagnosis not present

## 2021-02-04 DIAGNOSIS — L821 Other seborrheic keratosis: Secondary | ICD-10-CM | POA: Diagnosis not present

## 2021-02-04 DIAGNOSIS — E785 Hyperlipidemia, unspecified: Secondary | ICD-10-CM | POA: Diagnosis not present

## 2021-02-04 DIAGNOSIS — M6281 Muscle weakness (generalized): Secondary | ICD-10-CM | POA: Diagnosis not present

## 2021-02-05 ENCOUNTER — Other Ambulatory Visit: Payer: Self-pay | Admitting: Internal Medicine

## 2021-02-05 DIAGNOSIS — H409 Unspecified glaucoma: Secondary | ICD-10-CM

## 2021-02-05 NOTE — Telephone Encounter (Signed)
RX sent to Virgie Dad, MD to advise if ok to give additional refills if medication is meant for long-term use

## 2021-03-13 ENCOUNTER — Other Ambulatory Visit: Payer: Self-pay | Admitting: Internal Medicine

## 2021-03-13 DIAGNOSIS — H409 Unspecified glaucoma: Secondary | ICD-10-CM

## 2021-03-19 ENCOUNTER — Other Ambulatory Visit: Payer: Self-pay | Admitting: Nurse Practitioner

## 2021-03-19 DIAGNOSIS — H409 Unspecified glaucoma: Secondary | ICD-10-CM

## 2021-03-28 DIAGNOSIS — H35353 Cystoid macular degeneration, bilateral: Secondary | ICD-10-CM | POA: Diagnosis not present

## 2021-03-28 DIAGNOSIS — Z961 Presence of intraocular lens: Secondary | ICD-10-CM | POA: Diagnosis not present

## 2021-03-28 DIAGNOSIS — Z947 Corneal transplant status: Secondary | ICD-10-CM | POA: Diagnosis not present

## 2021-03-28 DIAGNOSIS — H353132 Nonexudative age-related macular degeneration, bilateral, intermediate dry stage: Secondary | ICD-10-CM | POA: Diagnosis not present

## 2021-03-28 DIAGNOSIS — H2512 Age-related nuclear cataract, left eye: Secondary | ICD-10-CM | POA: Diagnosis not present

## 2021-04-26 DIAGNOSIS — K219 Gastro-esophageal reflux disease without esophagitis: Secondary | ICD-10-CM | POA: Diagnosis not present

## 2021-04-26 DIAGNOSIS — R1314 Dysphagia, pharyngoesophageal phase: Secondary | ICD-10-CM | POA: Diagnosis not present

## 2021-05-03 ENCOUNTER — Other Ambulatory Visit: Payer: Self-pay | Admitting: Otolaryngology

## 2021-05-03 DIAGNOSIS — R1314 Dysphagia, pharyngoesophageal phase: Secondary | ICD-10-CM

## 2021-05-03 DIAGNOSIS — K219 Gastro-esophageal reflux disease without esophagitis: Secondary | ICD-10-CM

## 2021-05-24 DIAGNOSIS — Z1231 Encounter for screening mammogram for malignant neoplasm of breast: Secondary | ICD-10-CM | POA: Diagnosis not present

## 2021-08-28 ENCOUNTER — Other Ambulatory Visit: Payer: Self-pay | Admitting: Nurse Practitioner

## 2021-08-28 DIAGNOSIS — H409 Unspecified glaucoma: Secondary | ICD-10-CM

## 2021-09-04 DIAGNOSIS — Z947 Corneal transplant status: Secondary | ICD-10-CM | POA: Diagnosis not present

## 2021-09-04 DIAGNOSIS — H18512 Endothelial corneal dystrophy, left eye: Secondary | ICD-10-CM | POA: Diagnosis not present

## 2021-09-04 DIAGNOSIS — Z961 Presence of intraocular lens: Secondary | ICD-10-CM | POA: Diagnosis not present

## 2021-09-04 DIAGNOSIS — H353132 Nonexudative age-related macular degeneration, bilateral, intermediate dry stage: Secondary | ICD-10-CM | POA: Diagnosis not present

## 2021-09-04 DIAGNOSIS — H2512 Age-related nuclear cataract, left eye: Secondary | ICD-10-CM | POA: Diagnosis not present

## 2021-10-30 ENCOUNTER — Other Ambulatory Visit: Payer: Self-pay | Admitting: Internal Medicine

## 2021-10-30 DIAGNOSIS — H409 Unspecified glaucoma: Secondary | ICD-10-CM

## 2021-12-31 ENCOUNTER — Emergency Department (HOSPITAL_COMMUNITY): Payer: Medicare Other

## 2021-12-31 ENCOUNTER — Observation Stay (HOSPITAL_BASED_OUTPATIENT_CLINIC_OR_DEPARTMENT_OTHER): Payer: Medicare Other

## 2021-12-31 ENCOUNTER — Encounter (HOSPITAL_COMMUNITY): Payer: Self-pay | Admitting: Radiology

## 2021-12-31 ENCOUNTER — Observation Stay (HOSPITAL_COMMUNITY): Payer: Medicare Other

## 2021-12-31 ENCOUNTER — Observation Stay (HOSPITAL_COMMUNITY)
Admission: EM | Admit: 2021-12-31 | Discharge: 2022-01-02 | Disposition: A | Discharge status: Home Health | Payer: Medicare Other | Attending: Internal Medicine | Admitting: Internal Medicine

## 2021-12-31 DIAGNOSIS — E7800 Pure hypercholesterolemia, unspecified: Secondary | ICD-10-CM | POA: Diagnosis present

## 2021-12-31 DIAGNOSIS — Z7982 Long term (current) use of aspirin: Secondary | ICD-10-CM | POA: Diagnosis not present

## 2021-12-31 DIAGNOSIS — I6523 Occlusion and stenosis of bilateral carotid arteries: Secondary | ICD-10-CM | POA: Diagnosis not present

## 2021-12-31 DIAGNOSIS — I639 Cerebral infarction, unspecified: Secondary | ICD-10-CM | POA: Diagnosis not present

## 2021-12-31 DIAGNOSIS — Z79899 Other long term (current) drug therapy: Secondary | ICD-10-CM | POA: Insufficient documentation

## 2021-12-31 DIAGNOSIS — R7303 Prediabetes: Secondary | ICD-10-CM | POA: Diagnosis not present

## 2021-12-31 DIAGNOSIS — Z20822 Contact with and (suspected) exposure to covid-19: Secondary | ICD-10-CM | POA: Diagnosis not present

## 2021-12-31 DIAGNOSIS — R4701 Aphasia: Secondary | ICD-10-CM | POA: Diagnosis not present

## 2021-12-31 DIAGNOSIS — K219 Gastro-esophageal reflux disease without esophagitis: Secondary | ICD-10-CM | POA: Diagnosis present

## 2021-12-31 DIAGNOSIS — G459 Transient cerebral ischemic attack, unspecified: Principal | ICD-10-CM | POA: Diagnosis present

## 2021-12-31 DIAGNOSIS — I10 Essential (primary) hypertension: Secondary | ICD-10-CM | POA: Diagnosis not present

## 2021-12-31 DIAGNOSIS — Z8679 Personal history of other diseases of the circulatory system: Secondary | ICD-10-CM

## 2021-12-31 DIAGNOSIS — H409 Unspecified glaucoma: Secondary | ICD-10-CM | POA: Diagnosis present

## 2021-12-31 DIAGNOSIS — I672 Cerebral atherosclerosis: Secondary | ICD-10-CM | POA: Diagnosis not present

## 2021-12-31 DIAGNOSIS — E871 Hypo-osmolality and hyponatremia: Secondary | ICD-10-CM | POA: Diagnosis not present

## 2021-12-31 DIAGNOSIS — R29818 Other symptoms and signs involving the nervous system: Secondary | ICD-10-CM | POA: Diagnosis not present

## 2021-12-31 DIAGNOSIS — K449 Diaphragmatic hernia without obstruction or gangrene: Secondary | ICD-10-CM | POA: Diagnosis present

## 2021-12-31 DIAGNOSIS — M810 Age-related osteoporosis without current pathological fracture: Secondary | ICD-10-CM | POA: Diagnosis not present

## 2021-12-31 DIAGNOSIS — J449 Chronic obstructive pulmonary disease, unspecified: Secondary | ICD-10-CM | POA: Diagnosis not present

## 2021-12-31 LAB — CBC
HCT: 42.3 % (ref 36.0–46.0)
Hemoglobin: 13.3 g/dL (ref 12.0–15.0)
MCH: 29.2 pg (ref 26.0–34.0)
MCHC: 31.4 g/dL (ref 30.0–36.0)
MCV: 92.8 fL (ref 80.0–100.0)
Platelets: 204 10*3/uL (ref 150–400)
RBC: 4.56 MIL/uL (ref 3.87–5.11)
RDW: 14.9 % (ref 11.5–15.5)
WBC: 8 10*3/uL (ref 4.0–10.5)
nRBC: 0 % (ref 0.0–0.2)

## 2021-12-31 LAB — URINALYSIS, ROUTINE W REFLEX MICROSCOPIC
Bilirubin Urine: NEGATIVE
Glucose, UA: NEGATIVE mg/dL
Hgb urine dipstick: NEGATIVE
Ketones, ur: NEGATIVE mg/dL
Nitrite: NEGATIVE
Protein, ur: NEGATIVE mg/dL
Specific Gravity, Urine: 1.005 (ref 1.005–1.030)
pH: 7 (ref 5.0–8.0)

## 2021-12-31 LAB — COMPREHENSIVE METABOLIC PANEL
ALT: 10 U/L (ref 0–44)
AST: 20 U/L (ref 15–41)
Albumin: 3.6 g/dL (ref 3.5–5.0)
Alkaline Phosphatase: 80 U/L (ref 38–126)
Anion gap: 7 (ref 5–15)
BUN: 20 mg/dL (ref 8–23)
CO2: 21 mmol/L — ABNORMAL LOW (ref 22–32)
Calcium: 8.9 mg/dL (ref 8.9–10.3)
Chloride: 105 mmol/L (ref 98–111)
Creatinine, Ser: 1.2 mg/dL — ABNORMAL HIGH (ref 0.44–1.00)
GFR, Estimated: 43 mL/min — ABNORMAL LOW (ref >60)
Glucose, Bld: 104 mg/dL — ABNORMAL HIGH (ref 70–99)
Potassium: 4.4 mmol/L (ref 3.5–5.1)
Sodium: 133 mmol/L — ABNORMAL LOW (ref 135–145)
Total Bilirubin: 1 mg/dL (ref 0.3–1.2)
Total Protein: 6.9 g/dL (ref 6.5–8.1)

## 2021-12-31 LAB — I-STAT CHEM 8, ED
BUN: 26 mg/dL — ABNORMAL HIGH (ref 8–23)
Calcium, Ion: 1.08 mmol/L — ABNORMAL LOW (ref 1.15–1.40)
Chloride: 108 mmol/L (ref 98–111)
Creatinine, Ser: 1.2 mg/dL — ABNORMAL HIGH (ref 0.44–1.00)
Glucose, Bld: 105 mg/dL — ABNORMAL HIGH (ref 70–99)
HCT: 43 % (ref 36.0–46.0)
Hemoglobin: 14.6 g/dL (ref 12.0–15.0)
Potassium: 6.8 mmol/L (ref 3.5–5.1)
Sodium: 136 mmol/L (ref 135–145)
TCO2: 24 mmol/L (ref 22–32)

## 2021-12-31 LAB — RAPID URINE DRUG SCREEN, HOSP PERFORMED
Amphetamines: NOT DETECTED
Barbiturates: NOT DETECTED
Benzodiazepines: NOT DETECTED
Cocaine: NOT DETECTED
Opiates: NOT DETECTED
Tetrahydrocannabinol: NOT DETECTED

## 2021-12-31 LAB — CBG MONITORING, ED
Glucose-Capillary: 105 mg/dL — ABNORMAL HIGH (ref 70–99)
Glucose-Capillary: 109 mg/dL — ABNORMAL HIGH (ref 70–99)

## 2021-12-31 LAB — DIFFERENTIAL
Abs Immature Granulocytes: 0.05 10*3/uL (ref 0.00–0.07)
Basophils Absolute: 0.1 10*3/uL (ref 0.0–0.1)
Basophils Relative: 1 %
Eosinophils Absolute: 0.5 10*3/uL (ref 0.0–0.5)
Eosinophils Relative: 6 %
Immature Granulocytes: 1 %
Lymphocytes Relative: 16 %
Lymphs Abs: 1.3 10*3/uL (ref 0.7–4.0)
Monocytes Absolute: 0.7 10*3/uL (ref 0.1–1.0)
Monocytes Relative: 8 %
Neutro Abs: 5.5 10*3/uL (ref 1.7–7.7)
Neutrophils Relative %: 68 %

## 2021-12-31 LAB — GLUCOSE, CAPILLARY: Glucose-Capillary: 107 mg/dL — ABNORMAL HIGH (ref 70–99)

## 2021-12-31 LAB — RESP PANEL BY RT-PCR (FLU A&B, COVID) ARPGX2
Influenza A by PCR: NEGATIVE
Influenza B by PCR: NEGATIVE
SARS Coronavirus 2 by RT PCR: NEGATIVE

## 2021-12-31 LAB — ETHANOL: Alcohol, Ethyl (B): <10 mg/dL (ref <10)

## 2021-12-31 LAB — PROTIME-INR
INR: 0.9 (ref 0.8–1.2)
Prothrombin Time: 12.3 seconds (ref 11.4–15.2)

## 2021-12-31 MED ORDER — STROKE: EARLY STAGES OF RECOVERY BOOK
Freq: Once | Status: DC
  Filled 2021-12-31: qty 1

## 2021-12-31 MED ORDER — TIMOLOL MALEATE 0.5 % OP SOLN
1.0000 [drp] | Freq: Two times a day (BID) | OPHTHALMIC | Status: DC
  Administered 2021-12-31 – 2022-01-02 (×4): 1 [drp] via OPHTHALMIC
  Filled 2021-12-31: qty 5

## 2021-12-31 MED ORDER — ONDANSETRON HCL 4 MG/2ML IJ SOLN: 4.0000 mg | Freq: Four times a day (QID) | INTRAMUSCULAR | Status: DC | PRN

## 2021-12-31 MED ORDER — ACETAMINOPHEN 650 MG RE SUPP: 650.0000 mg | Freq: Four times a day (QID) | RECTAL | Status: DC | PRN

## 2021-12-31 MED ORDER — DOCUSATE SODIUM 100 MG PO CAPS
200.0000 mg | ORAL_CAPSULE | Freq: Every day | ORAL | Status: DC
  Administered 2021-12-31 – 2022-01-01 (×2): 200 mg via ORAL
  Filled 2021-12-31 (×2): qty 2

## 2021-12-31 MED ORDER — ENOXAPARIN SODIUM 30 MG/0.3ML IJ SOSY
30.0000 mg | PREFILLED_SYRINGE | INTRAMUSCULAR | Status: DC
  Administered 2021-12-31 – 2022-01-01 (×2): 30 mg via SUBCUTANEOUS
  Filled 2021-12-31 (×2): qty 0.3

## 2021-12-31 MED ORDER — VITAMIN D 25 MCG (1000 UNIT) PO TABS
2000.0000 [IU] | ORAL_TABLET | Freq: Every day | ORAL | Status: DC
  Administered 2021-12-31 – 2022-01-01 (×2): 2000 [IU] via ORAL
  Filled 2021-12-31 (×2): qty 2

## 2021-12-31 MED ORDER — ACETAMINOPHEN 325 MG PO TABS: 650.0000 mg | ORAL_TABLET | Freq: Four times a day (QID) | ORAL | Status: DC | PRN

## 2021-12-31 MED ORDER — PANTOPRAZOLE SODIUM 40 MG PO TBEC
40.0000 mg | DELAYED_RELEASE_TABLET | Freq: Every day | ORAL | Status: DC
Start: 2021-12-31 — End: 2022-01-02
  Administered 2021-12-31 – 2022-01-02 (×3): 40 mg via ORAL
  Filled 2021-12-31 (×3): qty 1

## 2021-12-31 MED ORDER — ONDANSETRON HCL 4 MG PO TABS: 4.0000 mg | ORAL_TABLET | Freq: Four times a day (QID) | ORAL | Status: DC | PRN

## 2021-12-31 MED ORDER — MOMETASONE FURO-FORMOTEROL FUM 200-5 MCG/ACT IN AERO
2.0000 | INHALATION_SPRAY | Freq: Two times a day (BID) | RESPIRATORY_TRACT | Status: DC
  Administered 2022-01-01 – 2022-01-02 (×3): 2 via RESPIRATORY_TRACT
  Filled 2021-12-31: qty 8.8

## 2021-12-31 MED ORDER — ASPIRIN EC 81 MG PO TBEC
81.0000 mg | DELAYED_RELEASE_TABLET | Freq: Every day | ORAL | Status: DC
  Administered 2021-12-31 – 2022-01-01 (×2): 81 mg via ORAL
  Filled 2021-12-31 (×2): qty 1

## 2021-12-31 MED ORDER — AMITRIPTYLINE HCL 10 MG PO TABS
10.0000 mg | ORAL_TABLET | Freq: Every day | ORAL | Status: DC
  Administered 2021-12-31 – 2022-01-01 (×2): 10 mg via ORAL
  Filled 2021-12-31 (×3): qty 1

## 2021-12-31 MED ORDER — KETOROLAC TROMETHAMINE 0.5 % OP SOLN
1.0000 [drp] | Freq: Two times a day (BID) | OPHTHALMIC | Status: DC
  Administered 2021-12-31 – 2022-01-02 (×4): 1 [drp] via OPHTHALMIC
  Filled 2021-12-31: qty 5

## 2021-12-31 NOTE — ED Notes (Signed)
Pt in MRI.
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
## 2021-12-31 NOTE — Plan of Care (Signed)
ON CALL PHONE CONSULT ? ?Call from Dr. Dykstra'@[email or web address]  long hospital ER ? ? ?Discussion: 88 year old with about 2 hours of expressive aphasia which has since resolved.  CT head with a right parietal infarction that is new since 2020. ?The above finding does not fit with the expressive aphasia but given the fact that she had a new stroke since 2020 and now had strokelike symptoms, would recommend admission for stroke/TIA work-up. ?MRI brain, CTA head and neck if renal function is okay, if renal function is impaired, can get MRA head without contrast and carotid Dopplers, lipid panel, A1c, 2D echo. ?Patient will be [facility] upon [facility] at [facility] and will be followed by the stroke team. ? ?-- ?[facility], MD ?Neurologist ?[facility] ?Pager: [phone] ? ?

## 2021-12-31 NOTE — Progress Notes (Signed)
Carotid duplex has been completed. ? ? ?Results can be found under chart review under CV PROC. ?12/31/2021 4:17 PM ?Velia Pamer RVT, RDMS ? ?

## 2021-12-31 NOTE — ED Provider Notes (Signed)
[facility] DEPT Provider Note   CSN: [id number] Arrival date & time: 12/31/21  1245     History  Chief Complaint  Patient presents with   Aphasia    Tina Mercado is a 88 y.o. female.  Presented to the emergency department with concern for episode of speech difficulty.  This morning patient noticed that she was not able to express words well.  Friend noticed that she was having speech difficulty.  Lasted for about 2 hours or so.  Seem to resolve when she arrived in ER here.  Denies any change in speech at present.  No change in vision, no numbness weakness tingling.  Friend at bedside provides some additional history, she was with patient, states that she seemed to be understanding what was going on but was speaking gibberish.  Episode of speech difficulty.  Describes episode of expressive aphasia lasting for a couple hours.       Home Medications Prior to Admission medications   Medication Sig Start Date End Date Taking? Authorizing Provider  acetaminophen (TYLENOL) 325 MG tablet Take 2 tablets (650 mg total) by mouth every 6 (six) hours as needed for mild pain, moderate pain or fever. 05/16/20   Maudie Mercury, MD  ADVAIR [facility] 115-21 MCG/ACT inhaler TAKE 2 PUFFS BY MOUTH TWICE A DAY 01/21/21   Virgie Dad, MD  amitriptyline (ELAVIL) 10 MG tablet Take 1 tablet (10 mg total) by mouth at bedtime. G62.9 01/18/21   Mast, Man X, NP  aspirin EC 81 MG tablet Take 81 mg by mouth at bedtime.     [provider]  Cholecalciferol (VITAMIN D) 50 MCG (2000 UT) CAPS Take 2,000 Units by mouth at bedtime.    [provider]  docusate sodium (COLACE) 100 MG capsule Take 200 mg by mouth daily as needed for mild constipation.     [provider]  esomeprazole (NEXIUM) 40 MG capsule TAKE 1 CAPSULE (40 MG TOTAL) BY MOUTH AT BEDTIME. 04/21/19   Parrett, Tammy S, NP  hydroxypropyl methylcellulose / hypromellose (ISOPTO TEARS / GONIOVISC) 2.5 %  ophthalmic solution Place 2 drops into both eyes every 2 (two) hours as needed for dry eyes. 06/21/20   Mast, Man X, NP  ketorolac (ACULAR) 0.5 % ophthalmic solution Place 1 drop into the right eye 2 (two) times daily. 06/21/20   Mast, Man X, NP  meclizine (ANTIVERT) 25 MG tablet Take 1 tablet (25 mg total) by mouth 3 (three) times daily as needed for dizziness. 06/21/20   Mast, Man X, NP  rosuvastatin (CRESTOR) 10 MG tablet TAKE 1 TABLET BY MOUTH EVERY DAY 12/17/20   Virgie Dad, MD  timolol (TIMOPTIC) 0.5 % ophthalmic solution INSTILL 1 DROP INTO BOTH EYES TWICE A DAY 08/28/21   Virgie Dad, MD      Allergies    Contrast media [iodinated contrast media]; Antihistamines, diphenhydramine-type; Celebrex [celecoxib]; Darvocet [propoxyphene n-acetaminophen]; Demerol; Diphenhydramine hcl; Feldene [piroxicam]; Gabapentin; Meperidine; Norvasc [amlodipine besylate]; Propoxyphene; and Iodine-131    Review of Systems   Review of Systems  Constitutional:  Negative for chills and fever.  HENT:  Negative for ear pain and sore throat.   Eyes:  Negative for pain and visual disturbance.  Respiratory:  Negative for cough and shortness of breath.   Cardiovascular:  Negative for chest pain and palpitations.  Gastrointestinal:  Negative for abdominal pain and vomiting.  Genitourinary:  Negative for dysuria and hematuria.  Musculoskeletal:  Negative for arthralgias and back  pain.  Skin:  Negative for color change and rash.  Neurological:  Positive for speech difficulty. Negative for seizures and syncope.  All other systems reviewed and are negative.  Physical Exam Updated Vital Signs BP 105/83    Pulse 67    Temp 97.9 F (36.6 C) (Oral)    Resp 19    LMP  (LMP Unknown)    SpO2 98%  Physical Exam Vitals and nursing note reviewed.  Constitutional:      General: She is not in acute distress.    Appearance: She is well-developed.  HENT:     Head: Normocephalic and atraumatic.  Eyes:      Conjunctiva/sclera: Conjunctivae normal.  Cardiovascular:     Rate and Rhythm: Normal rate and regular rhythm.     Heart sounds: No murmur heard. Pulmonary:     Effort: Pulmonary effort is normal. No respiratory distress.     Breath sounds: Normal breath sounds.  Abdominal:     Palpations: Abdomen is soft.     Tenderness: There is no abdominal tenderness.  Musculoskeletal:        General: No swelling.     Cervical back: Neck supple.  Skin:    General: Skin is warm and dry.     Capillary Refill: Capillary refill takes less than 2 seconds.  Neurological:     Mental Status: She is alert.     Comments: AAOx3 CN 2-12 intact, speech clear visual fields intact 5/5 strength in b/l UE and LE Sensation to light touch intact in b/l UE and LE Normal FNF  Psychiatric:        Mood and Affect: Mood normal.    ED Results / Procedures / Treatments   Labs (all labs ordered are listed, but only abnormal results are displayed) Labs Reviewed  COMPREHENSIVE METABOLIC PANEL - Abnormal; Notable for the following components:      Result Value   Sodium 133 (*)    CO2 21 (*)    Glucose, Bld 104 (*)    Creatinine, Ser 1.20 (*)    GFR, Estimated 43 (*)    All other components within normal limits  CBG MONITORING, ED - Abnormal; Notable for the following components:   Glucose-Capillary 105 (*)    All other components within normal limits  I-STAT CHEM 8, ED - Abnormal; Notable for the following components:   Potassium 6.8 (*)    BUN 26 (*)    Creatinine, Ser 1.20 (*)    Glucose, Bld 105 (*)    Calcium, Ion 1.08 (*)    All other components within normal limits  RESP PANEL BY RT-PCR (FLU A&B, COVID) ARPGX2  ETHANOL  PROTIME-INR  CBC  DIFFERENTIAL  RAPID URINE DRUG SCREEN, HOSP PERFORMED  URINALYSIS, ROUTINE W REFLEX MICROSCOPIC    EKG None  Radiology CT HEAD WO CONTRAST  Result Date: 12/31/2021 CLINICAL DATA:  TIA, expressive aphasia for couple days EXAM: CT HEAD WITHOUT CONTRAST  TECHNIQUE: Contiguous axial images were obtained from the base of the skull through the vertex without intravenous contrast. RADIATION DOSE REDUCTION: This exam was performed according to the departmental dose-optimization program which includes automated exposure control, adjustment of the mA and/or kV according to patient size and/or use of iterative reconstruction technique. COMPARISON:  On 04/16/2019 FINDINGS: Brain: Generalized atrophy. Normal ventricular morphology. No midline shift or mass effect. Small vessel chronic ischemic changes of deep cerebral white matter. Posterior RIGHT parietal infarct, appears old but is new since 11/01/2018. No intracranial hemorrhage,  mass lesion, or evidence of acute infarction. No extra-axial fluid collections. Vascular: No hyperdense vessels. Atherosclerotic calcification of internal carotid arteries at skull base Skull: Demineralized but intact Sinuses/Orbits: Clear Other: N/A IMPRESSION: Atrophy with small vessel chronic ischemic changes of deep cerebral white matter. Posterior RIGHT parietal infarct, appears old but new since 04/16/2019 exam. No acute intracranial abnormalities. Electronically Signed   By: Lavonia Dana M.D.   On: 12/31/2021 14:44    Procedures Procedures    Medications Ordered in ED Medications - No data to display  ED Course/ Medical Decision Making/ A&P                           Medical Decision Making Amount and/or Complexity of Data Reviewed Labs: ordered. Radiology: ordered.  Risk Decision regarding hospitalization.   88 year old lady presenting to the emergency department after having aphasia episode.  On exam, patient well-appearing in no distress with normal neurologic status at present.  Concern for possibility of TIA versus stroke.  CT head demonstrating old parietal infarct but no acute findings.  I independently reviewed CT imaging.  I discussed the case with Dr. Malen Gauze on-call for neurology.  Given her elevated ABCD2  score, he recommends admission for further stroke work-up including MRI brain.  Have ordered MRI, placed consult to hospitalist for admission.     Basic lab work stable.  Chem-8 showed hyperkalemia but upstairs chemistry potassium was normal.        Final Clinical Impression(s) / ED Diagnoses Final diagnoses:  TIA (transient ischemic attack)  Aphasia    Rx / DC Orders ED Discharge Orders     None         Lucrezia Starch, MD 12/31/21 1514

## 2021-12-31 NOTE — ED Triage Notes (Signed)
Pt states she noticed herself not being able to form sentences like she normally does. States this has happened over the past couple of days. States her speech is normal now. ?

## 2021-12-31 NOTE — ED Notes (Signed)
Pt in CT.

## 2021-12-31 NOTE — ED Notes (Signed)
Briscoe Deutscher (pt's friend) [phone] call with updates  ?

## 2022-01-01 ENCOUNTER — Observation Stay (HOSPITAL_BASED_OUTPATIENT_CLINIC_OR_DEPARTMENT_OTHER): Payer: Medicare Other

## 2022-01-01 ENCOUNTER — Other Ambulatory Visit: Payer: Self-pay

## 2022-01-01 DIAGNOSIS — I639 Cerebral infarction, unspecified: Secondary | ICD-10-CM

## 2022-01-01 DIAGNOSIS — R4701 Aphasia: Secondary | ICD-10-CM | POA: Diagnosis not present

## 2022-01-01 DIAGNOSIS — I10 Essential (primary) hypertension: Secondary | ICD-10-CM

## 2022-01-01 DIAGNOSIS — K219 Gastro-esophageal reflux disease without esophagitis: Secondary | ICD-10-CM

## 2022-01-01 DIAGNOSIS — G459 Transient cerebral ischemic attack, unspecified: Secondary | ICD-10-CM

## 2022-01-01 DIAGNOSIS — J449 Chronic obstructive pulmonary disease, unspecified: Secondary | ICD-10-CM

## 2022-01-01 DIAGNOSIS — I63411 Cerebral infarction due to embolism of right middle cerebral artery: Secondary | ICD-10-CM

## 2022-01-01 DIAGNOSIS — I6930 Unspecified sequelae of cerebral infarction: Secondary | ICD-10-CM

## 2022-01-01 LAB — ECHOCARDIOGRAM COMPLETE
AR max vel: 1.99 cm2
AV Area VTI: 2.13 cm2
AV Area mean vel: 1.93 cm2
AV Mean grad: 4 mmHg
AV Peak grad: 6.7 mmHg
Ao pk vel: 1.29 m/s
Area-P 1/2: 1.62 cm2
Calc EF: 50.2 %
Height: 61 in
MV M vel: 4.26 m/s
MV Peak grad: 72.6 mmHg
S' Lateral: 1.7 cm
Single Plane A2C EF: 51 %
Single Plane A4C EF: 50.2 %
Weight: 1950.63 oz

## 2022-01-01 LAB — BASIC METABOLIC PANEL
Anion gap: 11 (ref 5–15)
BUN: 13 mg/dL (ref 8–23)
CO2: 21 mmol/L — ABNORMAL LOW (ref 22–32)
Calcium: 9.2 mg/dL (ref 8.9–10.3)
Chloride: 106 mmol/L (ref 98–111)
Creatinine, Ser: 1.08 mg/dL — ABNORMAL HIGH (ref 0.44–1.00)
GFR, Estimated: 48 mL/min — ABNORMAL LOW (ref >60)
Glucose, Bld: 97 mg/dL (ref 70–99)
Potassium: 4.1 mmol/L (ref 3.5–5.1)
Sodium: 138 mmol/L (ref 135–145)

## 2022-01-01 LAB — GLUCOSE, CAPILLARY
Glucose-Capillary: 119 mg/dL — ABNORMAL HIGH (ref 70–99)
Glucose-Capillary: 147 mg/dL — ABNORMAL HIGH (ref 70–99)
Glucose-Capillary: 124 mg/dL — ABNORMAL HIGH (ref 70–99)
Glucose-Capillary: 126 mg/dL — ABNORMAL HIGH (ref 70–99)

## 2022-01-01 LAB — LIPID PANEL
Cholesterol: 321 mg/dL — ABNORMAL HIGH (ref 0–200)
HDL: 40 mg/dL — ABNORMAL LOW (ref >40)
LDL Cholesterol: 238 mg/dL — ABNORMAL HIGH (ref 0–99)
Total CHOL/HDL Ratio: 8 RATIO
Triglycerides: 215 mg/dL — ABNORMAL HIGH (ref <150)
VLDL: 43 mg/dL — ABNORMAL HIGH (ref 0–40)

## 2022-01-01 LAB — HEMOGLOBIN A1C
Hgb A1c MFr Bld: 5.4 % (ref 4.8–5.6)
Mean Plasma Glucose: 108.28 mg/dL

## 2022-01-01 MED ORDER — ROSUVASTATIN CALCIUM 20 MG PO TABS
40.0000 mg | ORAL_TABLET | Freq: Every day | ORAL | Status: DC
  Administered 2022-01-01 – 2022-01-02 (×2): 40 mg via ORAL
  Filled 2022-01-01 (×2): qty 2

## 2022-01-01 MED ORDER — CLOPIDOGREL BISULFATE 75 MG PO TABS
75.0000 mg | ORAL_TABLET | Freq: Every day | ORAL | Status: DC
  Administered 2022-01-01 – 2022-01-02 (×2): 75 mg via ORAL
  Filled 2022-01-01 (×2): qty 1

## 2022-01-01 NOTE — TOC Initial Note (Signed)
Transition of Care (TOC) - Initial/Assessment Note  ? ? ?Patient Details  ?Name: Tina Mercado ?MRN: [id number] ?Date of Birth: 1929-09-28 ? ?Transition of Care (TOC) CM/SW Contact:    ?[place], LCSW ?Phone Number: ?01/01/2022, 12:44 PM ? ?Clinical Narrative:                 ? ?CSW is informed by PT that pt is from [facility]. Pt will need home health PT/OT and likely some additional supervision at DC but pt does not want to transition to assisted living.  ? ?CSW called [name] with [facility]; confirmed that pt is at ILF there. She states that pt may be more appropriate for a short term "infirmary stay" in ALF where pt would have supervision for a temporary amount of time. She states she will likely call pt and discuss with pt. [facility] uses [facility] on their campus and just need PT/OT orders at DC.  ?Expected Discharge Plan: [place] ?Barriers to Discharge: Continued Medical Work up ? ? ?Patient Goals and CMS Choice ?  ?  ?  ? ?Expected Discharge Plan and Services ?Expected Discharge Plan: [place] ?  ?  ?  ?  ?                ?  ?  ?  ?  ?  ?  ?  ?  ?  ?  ? ?Prior Living Arrangements/Services ?  ?Lives with:: Facility Resident ?  ?       ?  ?  ?  ?  ? ?Activities of Daily Living ?Home Assistive Devices/Equipment: Gilford Rile (specify type) ?ADL Screening (condition at time of admission) ?Patient's cognitive ability adequate to safely complete daily activities?: Yes ?Is the patient deaf or have difficulty hearing?: Yes ?Does the patient have difficulty seeing, even when wearing glasses/contacts?: No ?Does the patient have difficulty concentrating, remembering, or making decisions?: No ?Patient able to express need for assistance with ADLs?: Yes ?Does the patient have difficulty dressing or bathing?: No ?Independently performs ADLs?: Yes (appropriate for developmental age) ?Does the patient have difficulty walking or climbing stairs?: No ?Weakness of  Legs: None ?Weakness of Arms/Hands: None ? ?Permission Sought/Granted ?  ?  ?   ?   ?   ?   ? ?Emotional Assessment ?  ?  ?  ?  ?  ?  ? ?Admission diagnosis:  Aphasia [R47.01] ?TIA (transient ischemic attack) [G45.9] ?Patient Active Problem List  ? Diagnosis Date Noted  ? TIA (transient ischemic attack) 12/31/2021  ? Glaucoma 12/31/2021  ? Edema 06/20/2020  ? UTI (urinary tract infection) 05/31/2020  ? Slow transit constipation 05/17/2020  ? Dry eyes 05/17/2020  ? NAGMA 05/11/2020  ? Hip fracture ([place]) 05/11/2020  ? Closed fracture of left inferior pubic ramus ([place])   ? Fall   ? Nondisplaced fracture of left inferior pubic rami 05/10/2020  ? Suspected COVID-19 virus infection 04/19/2019  ? Adult failure to thrive syndrome 04/18/2019  ? Acute respiratory failure with hypoxia ([place]) 04/16/2019  ? HCAP (healthcare-associated pneumonia) 04/16/2019  ? Anorexia 04/16/2019  ? Hypertension   ? Hyponatremia 04/10/2019  ? Generalized weakness 04/10/2019  ? Hypoxemia 04/10/2019  ? Prediabetes 09/28/2017  ? Dizziness 09/15/2017  ? Neuropathy 07/16/2017  ? COPD mixed type ([place]) 04/10/2015  ? Laryngopharyngeal reflux (LPR) 04/10/2015  ? Chronic laryngitis 08/17/2014  ? Blindness and low vision 04/13/2013  ? Osteoporosis 12/23/2012  ?  Bronchitis 10/30/2011  ? Benign hypertensive heart disease without heart failure 08/05/2011  ? Pure hypercholesterolemia 08/05/2011  ? Mitral regurgitation   ? History of anemia   ? History of diastolic dysfunction   ? GERD (gastroesophageal reflux disease)   ? History of seasonal allergies   ? Large hiatal hernia 11/23/2004  ? ?PCP:  System, Provider Not In ?Pharmacy:   ?[id number] [place]  [id number]?Phone: [phone]: [phone]? ? ? ? ?Social Determinants of Health (SDOH) Interventions ?  ? ?Readmission Risk Interventions ?No flowsheet data found. ? ? ?

## 2022-01-01 NOTE — Progress Notes (Signed)
Lower extremity venous has been completed.  ? ?Preliminary results in CV Proc.  ? ?Karita Dralle Dearius Hoffmann ?01/01/2022 3:10 PM    ?

## 2022-01-01 NOTE — Evaluation (Signed)
Occupational Therapy Evaluation ?Patient Details ?Name: Tina Mercado ?MRN: [id number] ?DOB: 05-28-1929 ?Today's Date: 01/01/2022 ? ? ?History of Present Illness Tina Mercado is a 88 y.o. female who was brought to ED after she had a brief episode of expressive aphasia. PMH: seasonal allergies, unspecified arrhythmia, cataracts, COPD, GERD, large hiatal hernia, glaucoma, impaired hearing, diastolic dysfunction, mitral regurgitation, heart murmur, history of anemia, hyperlipidemia, hypertension, osteoporosis, h/o L hip and pelvic fracture d/t falls over the last few years  ? ?Clinical Impression ?  ?Pt admitted for concerns listed above. PTA pt reported that she was independent with all ADL's and IADL's, including cooking and grocery shopping (with a friend driving her). At this time, pt presents near her baseline, able to complete transfers and ADL's with supervision. Pt appears mildly weak, however safe with all transitions and tasks. OT recommending [facility] services at her ILF. Pt has no further acute OT needs and acute OT will sign off.   ?   ? ?Recommendations for follow up therapy are one component of a multi-disciplinary discharge planning process, led by the attending physician.  Recommendations may be updated based on patient status, additional functional criteria and insurance authorization.  ? ?Follow Up Recommendations ? Home health OT  ?  ?Assistance Recommended at Discharge PRN  ?Patient can return home with the following A little help with walking and/or transfers;A little help with bathing/dressing/bathroom ? ?  ?Functional Status Assessment ? Patient has had a recent decline in their functional status and demonstrates the ability to make significant improvements in function in a reasonable and predictable amount of time.  ?Equipment Recommendations ? None recommended by OT  ?  ?Recommendations for Other Services   ? ? ?  ?Precautions / Restrictions Precautions ?Precautions: Fall ?Restrictions ?Weight  Bearing Restrictions: No  ? ?  ? ?Mobility Bed Mobility ?  ?  ?  ?  ?  ?  ?  ?General bed mobility comments: sitting in recliner ?  ? ?Transfers ?Overall transfer level: Modified independent ?Equipment used: Rolling walker (2 wheels) ?Transfers: Sit to/from Stand ?Sit to Stand: Modified independent (Device/Increase time) ?  ?  ?  ?  ?  ?General transfer comment: No assist to stand from recliner or elevated toilet sit ?  ? ?  ?Balance Overall balance assessment: Needs assistance ?Sitting-balance support: Feet supported, No upper extremity supported ?Sitting balance-Leahy Scale: Good ?Sitting balance - Comments: pt able to don/pull up socks at EOB without LOB ?  ?Standing balance support: Bilateral upper extremity supported, During functional activity ?Standing balance-Leahy Scale: Poor ?Standing balance comment: pt braces self on sink via trunk while washing hands. Reliant on BUE ?  ?  ?  ?  ?  ?  ?  ?  ?  ?  ?  ?   ? ?ADL either performed or assessed with clinical judgement  ? ?ADL Overall ADL's : At baseline;Modified independent ?  ?  ?  ?  ?  ?  ?  ?  ?  ?  ?  ?  ?  ?  ?  ?  ?  ?  ?  ?General ADL Comments: Pt able to complete all ADL's with no difficulty, moves well with RW, appears back to her baseline  ? ? ? ?Vision Baseline Vision/History: 1 Wears glasses ?Ability to See in Adequate Light: 0 Adequate ?Patient Visual Report: No change from baseline ?Vision Assessment?: No apparent visual deficits ?Additional Comments: Has had multiple transplants and surgery on her R eye  ?   ?  Perception   ?  ?Praxis   ?  ? ?Pertinent Vitals/Pain Pain Assessment ?Pain Assessment: No/denies pain  ? ? ? ?Hand Dominance Right ?  ?Extremity/Trunk Assessment Upper Extremity Assessment ?Upper Extremity Assessment: Generalized weakness ?  ?Lower Extremity Assessment ?Lower Extremity Assessment: Generalized weakness ?  ?Cervical / Trunk Assessment ?Cervical / Trunk Assessment: Kyphotic ?  ?Communication Communication ?Communication:  HOH ?  ?Cognition Arousal/Alertness: Awake/alert ?Behavior During Therapy: [facility] for tasks assessed/performed ?Overall Cognitive Status: Within Functional Limits for tasks assessed ?  ?  ?  ?  ?  ?  ?  ?  ?  ?  ?  ?  ?  ?  ?  ?  ?  ?  ?  ?General Comments  Pt reporting she feels like she is getting a UTI, RN aware ? ?  ?Exercises   ?  ?Shoulder Instructions    ? ? ?Home Living Family/patient expects to be discharged to:: Private residence (indep living at friends home) ?Living Arrangements: Alone ?Available Help at Discharge: Friend(s);Available PRN/intermittently ?Type of Home: Independent living facility ?Home Access: Level entry ?  ?  ?Home Layout: One level ?  ?  ?Bathroom Shower/Tub: Walk-in shower ?  ?Bathroom Toilet: Standard ?  ?  ?Home Equipment: Rollator (4 wheels);Grab bars - tub/shower;Grab bars - toilet;Hand held shower head ?  ?Additional Comments: lives at friends home [place], meal provided ?  ? ?  ?Prior Functioning/Environment Prior Level of Function : Needs assist ?  ?  ?  ?  ?  ?  ?Mobility Comments: uses rollator 100% of time ?ADLs Comments: pt reports dressing and bathing indep, goes to [employer] for meals ?  ? ?  ?  ?OT Problem List: Decreased strength;Decreased activity tolerance;Impaired balance (sitting and/or standing) ?  ?   ?OT Treatment/Interventions:    ?  ?OT Goals(Current goals can be found in the care plan section) Acute Rehab OT Goals ?Patient Stated Goal: To go home ?OT Goal Formulation: With patient ?Time For Goal Achievement: 01/01/22 ?Potential to Achieve Goals: Good  ?OT Frequency:   ?  ? ?Co-evaluation   ?  ?  ?  ?  ? ?  ?AM-PAC OT "6 Clicks" Daily Activity     ?Outcome Measure Help from another person eating meals?: None ?Help from another person taking care of personal grooming?: None ?Help from another person toileting, which includes using toliet, bedpan, or urinal?: None ?Help from another person bathing (including washing, rinsing, drying)?: None ?Help from another person  to put on and taking off regular upper body clothing?: None ?Help from another person to put on and taking off regular lower body clothing?: None ?6 Click Score: 24 ?  ?End of Session Equipment Utilized During Treatment: Gait belt;Rolling walker (2 wheels) ?Nurse Communication: Mobility status ? ?Activity Tolerance: Patient tolerated treatment well ?Patient left: in chair;with call bell/phone within reach ? ?OT Visit Diagnosis: Unsteadiness on feet (R26.81);Other abnormalities of gait and mobility (R26.89);Muscle weakness (generalized) (M62.81)  ?              ?Time: 6767-2094 ?OT Time Calculation (min): 34 min ?Charges:  OT General Charges ?$OT Visit: 1 Visit ?OT Evaluation ?$OT Eval Moderate Complexity: 1 Mod ?OT Treatments ?$Self Care/Home Management : 8-22 mins ? ?Dabney Schanz H., OTR/L ?Acute Rehabilitation ? ?Raylin Diguglielmo Elane Yolanda Bonine ?01/01/2022, 2:29 PM ?

## 2022-01-01 NOTE — Consult Note (Addendum)
Neurology Consult H&P ? ?[name] ?MR# [id number] ?01/01/2022 ? ? ?CC: Expressive aphasia transient ? ?History is obtained from: Patient and chart. ? 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  During evaluation a CT head showed a right parietal infarction that was new since 2020. ? ?She states that she knew what she wanted to say however could not get the words out.  This is the second time she had an episode like this and being a retired Marine scientist she became concerned and pursue further evaluation. ? ? ?LKW: Unclear ?tNK given: No not a candidate ?IR Thrombectomy No, not indicated ?Modified Rankin Scale: 0-Completely asymptomatic and back to baseline post- stroke ?NIHSS: 0 ? ?ROS: A complete ROS was performed and is negative except as noted in the HPI.  ? ?Past Medical History:  ?Diagnosis Date  ? Allergy   ? Arrhythmia   ? Cataract   ? COPD (chronic obstructive pulmonary disease) ([place])   ? GERD (gastroesophageal reflux disease)   ? Glaucoma   ? Hearing loss   ? does not wear hearing aids  ? Heart murmur   ? History of anemia   ? History of diastolic dysfunction   ? History of seasonal allergies   ? Hyperlipidemia   ? Hypertension   ? Large hiatal hernia   ? see on thoracic spine xray  ? Mitral regurgitation   ? mild to moderate  ? Osteoporosis   ? Wears glasses   ? readers  ? ? ? ?Family History  ?Problem Relation Age of Onset  ? Heart disease Mother   ? Emphysema Father   ? Emphysema Brother   ? COPD Brother   ? ? ?Social History:  reports that she has never smoked. She has never used smokeless tobacco. She reports that she does not drink alcohol and does not use drugs. ? ? ?Prior to Admission medications   ?Medication Sig Start Date End Date Taking? Authorizing Provider  ?ADVAIR HFA 115-21 MCG/ACT inhaler TAKE 2 PUFFS BY MOUTH TWICE A DAY 01/21/21  Yes Virgie Dad, MD  ?amitriptyline (ELAVIL) 10 MG  tablet Take 1 tablet (10 mg total) by mouth at bedtime. G62.9 01/18/21  Yes Mast, Man X, NP  ?aspirin EC 81 MG tablet Take 81 mg by mouth at bedtime.    Yes [provider]  ?Cholecalciferol (VITAMIN D) 50 MCG (2000 UT) CAPS Take 2,000 Units by mouth at bedtime.   Yes [provider]  ?docusate sodium (COLACE) 100 MG capsule Take 200 mg by mouth at bedtime.   Yes [provider]  ?esomeprazole (NEXIUM) 40 MG capsule TAKE 1 CAPSULE (40 MG TOTAL) BY MOUTH AT BEDTIME. 04/21/19  Yes Parrett, Tammy S, NP  ?ketorolac (ACULAR) 0.5 % ophthalmic solution Place 1 drop into the right eye 2 (two) times daily. 06/21/20  Yes Mast, Man X, NP  ?rosuvastatin (CRESTOR) 10 MG tablet TAKE 1 TABLET BY MOUTH EVERY DAY 12/17/20  Yes Virgie Dad, MD  ?timolol (TIMOPTIC) 0.5 % ophthalmic solution INSTILL 1 DROP INTO BOTH EYES TWICE A DAY 08/28/21  Yes Virgie Dad, MD  ?acetaminophen (TYLENOL) 325 MG tablet Take 2 tablets (650 mg total) by mouth every 6 (six) hours as needed for mild pain, moderate pain or fever. ?Patient not taking: Reported on 12/31/2021 05/16/20   Maudie Mercury, MD  ?hydroxypropyl methylcellulose / hypromellose (ISOPTO TEARS /  GONIOVISC) 2.5 % ophthalmic solution Place 2 drops into both eyes every 2 (two) hours as needed for dry eyes. ?Patient not taking: Reported on 12/31/2021 06/21/20   Mast, Man X, NP  ?meclizine (ANTIVERT) 25 MG tablet Take 1 tablet (25 mg total) by mouth 3 (three) times daily as needed for dizziness. ?Patient not taking: Reported on 12/31/2021 06/21/20   Mast, Man X, NP  ? ? ?Exam: ?Current vital signs: ?BP (!) 167/78 (BP Location: Left Arm)   Pulse 63   Temp 98.1 ?F (36.7 ?C) (Oral)   Resp 18   Ht '5\' 1"'$  (1.549 m)   Wt 55.3 kg   LMP  (LMP Unknown)   SpO2 96%   BMI 23.04 kg/m?  ? ?Physical Exam  ?Constitutional: Appears well-developed and well-nourished.  ?Psych: Affect appropriate to situation ?Eyes: No scleral injection ?HENT: No OP obstruction. ?Head: Normocephalic.   ?Cardiovascular: Normal rate and regular rhythm.  ?Respiratory: Effort normal, symmetric excursions bilaterally, no audible wheezing. ?GI: Soft.  No distension. There is no tenderness.  ?Skin: WDI ? ?Neuro: ?Mental Status: ?Patient is awake, alert, oriented to person, place, month, year, and situation. ?Patient is able to give a clear and coherent history. ?Speech  fluent, intact comprehension and repetition. ?No signs of aphasia or neglect. ?Visual Fields are full. Pupils surgical on right, left round, and both equally reactive to light. ?EOMI right eye ptosis or diplopia.  ?Facial sensation is symmetric to temperature ?Facial movement is symmetric.  ?Hearing is intact to very loud voice. ?Uvula midline and palate elevates symmetrically. ?Shoulder shrug is symmetric. ?Tongue is midline without atrophy or fasciculations.  ?Tone is normal. Bulk is normal. 5/5 strength was present in all four extremities. ?Sensation is symmetric to light touch and temperature in the arms and legs. ?Deep Tendon Reflexes: 2+ and symmetric in the biceps and patellae. ?Toes are downgoing bilaterally. ?FNF and HKS are intact bilaterally. ?Gait - Deferred ? ?I have reviewed labs in epic and the pertinent results are: ?CBG 107 ? ?I have reviewed the images obtained: ?MRI brain showed no acute infarction, hemorrhage or mass.  There was punctate subacute to chronic infarction in the right cerebral white matter with additional diffuse chronic ischemic changes. ?MRA head and neck showed no proximal intracranial vessel occlusion or significant flow-limiting stenosis. ? ?Assessment: Tina Mercado is a 88 y.o. female PMHx as noted above with transient episode of expressive aphasia now completely resolved.  MRI did not show subacute to chronic infarction right hemisphere and admitted for further stroke evaluation. ? ?Impression:  ?Expressive aphasia ?Subacute to chronic ischemic stroke right hemisphere ?NIHSS 0 ?HTN ?HLD ?Peripheral  neuropathy ?Bilateral hearing loss ? ?Plan: ?- Recommend TTE. ?- Recommend labs: HbA1c, lipid panel. ?- Recommend Statin for goal LDL <70. ?- Goal A1c <7. ?- Aspirin '81mg'$  daily. ?- Clopidogrel '75mg'$  daily for 3 weeks. ?- SBP goal <140. ?- Telemetry monitoring for arrhythmia. ?- Recommend bedside Swallow screen. ?- Recommend Stroke education. ?- Recommend PT/OT/SLP consult. ? ?Electronically signed by:  ?Lynnae Sandhoff, MD ?Page: [phone] ?01/01/2022, 3:09 AM ? ?If 7pm- 7am, please page neurology on call as listed in [place]. ? ?

## 2022-01-01 NOTE — Evaluation (Signed)
Speech Language Pathology Evaluation ?Patient Details ?Name: Tina Mercado ?MRN: [id number] ?DOB: 1929-02-23 ?Today's Date: 01/01/2022 ?Time: 5701-7793 ?SLP Time Calculation (min) (ACUTE ONLY): 33 min ? ?Problem List:  ?Patient Active Problem List  ? Diagnosis Date Noted  ? TIA (transient ischemic attack) 12/31/2021  ? Glaucoma 12/31/2021  ? Edema 06/20/2020  ? UTI (urinary tract infection) 05/31/2020  ? Slow transit constipation 05/17/2020  ? Dry eyes 05/17/2020  ? NAGMA 05/11/2020  ? Hip fracture ([place]) 05/11/2020  ? Closed fracture of left inferior pubic ramus ([place])   ? Fall   ? Nondisplaced fracture of left inferior pubic rami 05/10/2020  ? Suspected COVID-19 virus infection 04/19/2019  ? Adult failure to thrive syndrome 04/18/2019  ? Acute respiratory failure with hypoxia ([place]) 04/16/2019  ? HCAP (healthcare-associated pneumonia) 04/16/2019  ? Anorexia 04/16/2019  ? Hypertension   ? Hyponatremia 04/10/2019  ? Generalized weakness 04/10/2019  ? Hypoxemia 04/10/2019  ? Prediabetes 09/28/2017  ? Dizziness 09/15/2017  ? Neuropathy 07/16/2017  ? COPD mixed type ([place]) 04/10/2015  ? Laryngopharyngeal reflux (LPR) 04/10/2015  ? Chronic laryngitis 08/17/2014  ? Blindness and low vision 04/13/2013  ? Osteoporosis 12/23/2012  ? Bronchitis 10/30/2011  ? Benign hypertensive heart disease without heart failure 08/05/2011  ? Pure hypercholesterolemia 08/05/2011  ? Mitral regurgitation   ? History of anemia   ? History of diastolic dysfunction   ? GERD (gastroesophageal reflux disease)   ? History of seasonal allergies   ? Large hiatal hernia 11/23/2004  ? ?Past Medical History:  ?Past Medical History:  ?Diagnosis Date  ? Allergy   ? Arrhythmia   ? Cataract   ? COPD (chronic obstructive pulmonary disease) ([place])   ? GERD (gastroesophageal reflux disease)   ? Glaucoma   ? Hearing loss   ? does not wear hearing aids  ? Heart murmur   ? History of anemia   ? History of diastolic dysfunction   ? History of seasonal allergies   ?  Hyperlipidemia   ? Hypertension   ? Large hiatal hernia   ? see on thoracic spine xray  ? Mitral regurgitation   ? mild to moderate  ? Osteoporosis   ? Wears glasses   ? readers  ? ?Past Surgical History:  ?Past Surgical History:  ?Procedure Laterality Date  ? APPENDECTOMY    ? BREAST SURGERY    ? childbirth    ? x 1  ? CORNEAL TRANSPLANT  july 2007  ? EYE SURGERY    ? INTRAMEDULLARY (IM) NAIL INTERTROCHANTERIC Left 03/03/2019  ? Procedure: INTRAMEDULLARY (IM) NAIL INTERTROCHANTRIC;  Surgeon: Shona Needles, MD;  Location: [facility];  Service: Orthopedics;  Laterality: Left;  ? TONSILLECTOMY AND ADENOIDECTOMY    ? age 61yr  ? ?HPI:  ?Tina DIEROLFis a 88y.o. female who [facility] after she had a brief episode of expressive aphasia. PMHx significant for unspecified arrhythmia, cataracts, COPD, GERD, large hiatal hernia, glaucoma, impaired hearing, diastolic dysfunction, mitral regurgitation, heart murmur, history of anemia, hyperlipidemia, hypertension, osteoporosis. MRI: Punctate subacute to chronic infarct of the right cerebral white  matter. Additional chronic infarcts and chronic microvascular  ischemic changes. No acute event.  ? ?Assessment / Plan / Recommendation ?Clinical Impression ? Ms. HPeughpresents with normal expressive and receptive language with good fluency and only occasional difficulty with word retrieval. She is an excellent historian; demonstrates good awareness/judgment.  No SLP needs are identified.  Our  service will sign off; no f/u needed. ?   ?SLP Assessment ? SLP Recommendation/Assessment: Patient does not need any further [facility] ?SLP Visit Diagnosis: Cognitive communication deficit (R41.841)  ?  ?Recommendations for follow up therapy are one component of a multi-disciplinary discharge planning process, led by the attending physician.  Recommendations may be updated based on patient status,  additional functional criteria and insurance authorization. ?   ?Follow Up Recommendations ? No SLP follow up  ?  ?    ?    ?    ?  ?  ?   ?SLP Evaluation ?Cognition ? Overall Cognitive Status: Within Functional Limits for tasks assessed ?Orientation Level: Oriented X4  ?  ?   ?Comprehension ? Auditory Comprehension ?Overall Auditory Comprehension: Appears within functional limits for tasks assessed ?Reading Comprehension ?Reading Status:  (Readers not available - able to read large print)  ?  ?Expression Expression ?Primary Mode of Expression: Verbal ?Verbal Expression ?Overall Verbal Expression: Appears within functional limits for tasks assessed ?Written Expression ?Dominant Hand: Right   ?Oral / Motor ? Oral Motor/Sensory Function ?Overall Oral Motor/Sensory Function: Within functional limits ?Motor Speech ?Overall Motor Speech: Appears within functional limits for tasks assessed   ?        ? ?Juan Quam Laurice ?01/01/2022, 11:27 AM ?Estill Bamberg L. Cl[place], MA CCC/SLP ?Acute Rehabilitation Services ?Office number [phone] ?Pager [phone] ? ?

## 2022-01-01 NOTE — Progress Notes (Signed)
? ?  Echocardiogram ?2D Echocardiogram has been performed. ? ?Tina Mercado ?01/01/2022, 8:47 AM ?

## 2022-01-01 NOTE — Progress Notes (Signed)
STROKE TEAM PROGRESS NOTE   INTERVAL HISTORY Her friends are at the bedside.  Patient sitting in chair, stated that she had difficulty talking 1 months ago and lasted several weeks before resolution.  This time she again had expressive aphasia lasting 2 hours yesterday.  Currently she is back to baseline.  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  MRI confirmed right parietal old infarct but also subacute right CR small infarct.  She has seen Dr. Philipp Ovens in the past but now follows with Dr. Stanford Breed in cardiology.  She thinks that she might have A-fib in the past but no more.  She is in agreement with loop recorder anyway.  Vitals:   12/31/21 2134 12/31/21 2317 01/01/22 0135 01/01/22 0412  BP: (!) 216/91 (!) 159/104 (!) 167/78 (!) 191/76  Pulse: 68 72 63 66  Resp: '16 18 18 18  '$ Temp: 97.7 F (36.5 C) 98.3 F (36.8 C) 98.1 F (36.7 C) 98.7 F (37.1 C)  TempSrc: Axillary Oral Oral Oral  SpO2: 98% 97% 96% 96%  Weight: 55.3 kg     Height: '5\' 1"'$  (1.549 m)      CBC:  Recent Labs  Lab 12/31/21 1353 12/31/21 1414  WBC 8.0  --   NEUTROABS 5.5  --   HGB 13.3 14.6  HCT 42.3 43.0  MCV 92.8  --   PLT 204  --    Basic Metabolic Panel:  Recent Labs  Lab 12/31/21 1353 12/31/21 1414 01/01/22 0141  NA 133* 136 138  K 4.4 6.8* 4.1  CL 105 108 106  CO2 21*  --  21*  GLUCOSE 104* 105* 97  BUN 20 26* 13  CREATININE 1.20* 1.20* 1.08*  CALCIUM 8.9  --  9.2   Lipid Panel:  Recent Labs  Lab 01/01/22 0141  CHOL 321*  TRIG 215*  HDL 40*  CHOLHDL 8.0  VLDL 43*  LDLCALC 238*   HgbA1c:  Recent Labs  Lab 01/01/22 0141  HGBA1C 5.4   Urine Drug Screen:  Recent Labs  Lab 12/31/21 1353  LABOPIA NONE DETECTED  COCAINSCRNUR NONE DETECTED  LABBENZ NONE DETECTED  AMPHETMU NONE DETECTED  THCU NONE DETECTED  LABBARB NONE DETECTED    Alcohol Level  Recent Labs  Lab 12/31/21 1353  ETH <10    IMAGING past 24 hours CT HEAD WO CONTRAST  Result Date: 12/31/2021 CLINICAL  DATA:  TIA, expressive aphasia for couple days EXAM: CT HEAD WITHOUT CONTRAST TECHNIQUE: Contiguous axial images were obtained from the base of the skull through the vertex without intravenous contrast. RADIATION DOSE REDUCTION: This exam was performed according to the departmental dose-optimization program which includes automated exposure control, adjustment of the mA and/or kV according to patient size and/or use of iterative reconstruction technique. COMPARISON:  On 04/16/2019 FINDINGS: Brain: Generalized atrophy. Normal ventricular morphology. No midline shift or mass effect. Small vessel chronic ischemic changes of deep cerebral white matter. Posterior RIGHT parietal infarct, appears old but is new since 11/01/2018. No intracranial hemorrhage, mass lesion, or evidence of acute infarction. No extra-axial fluid collections. Vascular: No hyperdense vessels. Atherosclerotic calcification of internal carotid arteries at skull base Skull: Demineralized but intact Sinuses/Orbits: Clear Other: N/A IMPRESSION: Atrophy with small vessel chronic ischemic changes of deep cerebral white matter. Posterior RIGHT parietal infarct, appears old but new since 04/16/2019 exam. No acute intracranial abnormalities. Electronically Signed   By: Lavonia Dana M.D.   On: 12/31/2021 14:44   MR ANGIO HEAD WO CONTRAST  Result Date: 12/31/2021  CLINICAL DATA:  Neuro deficit, acute, stroke suspected aphasia; Stroke/TIA, determine embolic source EXAM: MRI HEAD WITHOUT CONTRAST MRA HEAD WITHOUT CONTRAST TECHNIQUE: Multiplanar, multi-echo pulse sequences of the brain and surrounding structures were acquired without intravenous contrast. Angiographic images of the Circle of Willis were acquired using MRA technique without intravenous contrast. COMPARISON:  No pertinent prior exam. FINDINGS: MRI HEAD Brain: A punctate focus of mild diffusion hyperintensity in the right centrum semiovale has corresponding ADC isointensity. Chronic right parietal  infarct extending to the temporal lobe junction. Additional patchy and confluent areas of T2 hyperintensity in the supratentorial white matter are nonspecific but may reflect mild chronic microvascular ischemic changes. There are small chronic bilateral cerebellar infarcts. Small chronic left parietal cortical infarct. No evidence of intracranial hemorrhage. No intracranial mass or mass effect. There is no hydrocephalus or extra-axial fluid collection. Vascular: Major vessel flow voids at the skull base are preserved. Skull and upper cervical spine: Normal marrow signal is preserved. Sinuses/Orbits: Right lens replacement.  Orbits are unremarkable. Other: Sella is unremarkable.  Mastoid air cells are clear. MRA HEAD Intracranial internal carotid arteries are patent with mild atherosclerotic irregularity. Middle and anterior cerebral arteries are patent. Intracranial vertebral arteries, basilar artery, posterior cerebral arteries are patent. Left larger than right posterior communicating arteries are present. Fetal origin of the left PCA. There is no significant stenosis or aneurysm. IMPRESSION: No acute infarct, hemorrhage, or mass. Punctate subacute to chronic infarct of the right cerebral white matter. Additional chronic infarcts and chronic microvascular ischemic changes. No proximal intracranial vessel occlusion or significant stenosis. Electronically Signed   By: Macy Mis M.D.   On: 12/31/2021 17:55   MR BRAIN WO CONTRAST  Result Date: 12/31/2021 CLINICAL DATA:  Neuro deficit, acute, stroke suspected aphasia; Stroke/TIA, determine embolic source EXAM: MRI HEAD WITHOUT CONTRAST MRA HEAD WITHOUT CONTRAST TECHNIQUE: Multiplanar, multi-echo pulse sequences of the brain and surrounding structures were acquired without intravenous contrast. Angiographic images of the Circle of Willis were acquired using MRA technique without intravenous contrast. COMPARISON:  No pertinent prior exam. FINDINGS: MRI HEAD  Brain: A punctate focus of mild diffusion hyperintensity in the right centrum semiovale has corresponding ADC isointensity. Chronic right parietal infarct extending to the temporal lobe junction. Additional patchy and confluent areas of T2 hyperintensity in the supratentorial white matter are nonspecific but may reflect mild chronic microvascular ischemic changes. There are small chronic bilateral cerebellar infarcts. Small chronic left parietal cortical infarct. No evidence of intracranial hemorrhage. No intracranial mass or mass effect. There is no hydrocephalus or extra-axial fluid collection. Vascular: Major vessel flow voids at the skull base are preserved. Skull and upper cervical spine: Normal marrow signal is preserved. Sinuses/Orbits: Right lens replacement.  Orbits are unremarkable. Other: Sella is unremarkable.  Mastoid air cells are clear. MRA HEAD Intracranial internal carotid arteries are patent with mild atherosclerotic irregularity. Middle and anterior cerebral arteries are patent. Intracranial vertebral arteries, basilar artery, posterior cerebral arteries are patent. Left larger than right posterior communicating arteries are present. Fetal origin of the left PCA. There is no significant stenosis or aneurysm. IMPRESSION: No acute infarct, hemorrhage, or mass. Punctate subacute to chronic infarct of the right cerebral white matter. Additional chronic infarcts and chronic microvascular ischemic changes. No proximal intracranial vessel occlusion or significant stenosis. Electronically Signed   By: Macy Mis M.D.   On: 12/31/2021 17:55   VAS US CAROTID  Result Date: 12/31/2021 Carotid Arterial Duplex Study Patient Name:  Tina Mercado  Date of Exam:  12/31/2021 Medical Rec #: [id number]          Accession #:    [id number] Date of Birth: Mar 19, 1929          Patient Gender: F Patient Age:   88 years Exam Location:  [facility] Procedure:      VAS US CAROTID Referring Phys: DAVID ORTIZ  --------------------------------------------------------------------------------  Indications:       TIA. Risk Factors:      Hypertension, hyperlipidemia, no history of smoking. Comparison Study:  No previous exams Performing Technologist: Jody Hill RVT, RDMS  Examination Guidelines: A complete evaluation includes B-mode imaging, spectral Doppler, color Doppler, and power Doppler as needed of all accessible portions of each vessel. Bilateral testing is considered an integral part of a complete examination. Limited examinations for reoccurring indications may be performed as noted.  Right Carotid Findings: +----------+--------+--------+--------+------------------+------------------+             PSV cm/s EDV cm/s Stenosis Plaque Description Comments            +----------+--------+--------+--------+------------------+------------------+  CCA Prox   47       6                                                        +----------+--------+--------+--------+------------------+------------------+  CCA Distal 45       9                                    intimal thickening  +----------+--------+--------+--------+------------------+------------------+  ICA Prox   42       10                                                       +----------+--------+--------+--------+------------------+------------------+  ICA Distal 78       20                                                       +----------+--------+--------+--------+------------------+------------------+  ECA        57       0                                    intimal thickening  +----------+--------+--------+--------+------------------+------------------+ +----------+--------+-------+----------------+-------------------+             PSV cm/s EDV cms Describe         Arm Pressure (mmHG)  +----------+--------+-------+----------------+-------------------+  Subclavian 97               Multiphasic, WNL                       +----------+--------+-------+----------------+-------------------+ +---------+--------+--+--------+-+----------------------------+  Vertebral PSV cm/s 41 EDV cm/s 0 High resistant and Antegrade  +---------+--------+--+--------+-+----------------------------+  Left Carotid Findings: +----------+--------+--------+--------+------------------+------------------+             PSV cm/s EDV cm/s Stenosis Plaque  Description Comments            +----------+--------+--------+--------+------------------+------------------+  CCA Prox   63       8                                                        +----------+--------+--------+--------+------------------+------------------+  CCA Distal 46       8                                    intimal thickening  +----------+--------+--------+--------+------------------+------------------+  ICA Prox   49       12       1-39%    calcific                               +----------+--------+--------+--------+------------------+------------------+  ICA Distal 65       13                                                       +----------+--------+--------+--------+------------------+------------------+  ECA        99       0                 calcific                               +----------+--------+--------+--------+------------------+------------------+ +----------+--------+--------+----------------+-------------------+             PSV cm/s EDV cm/s Describe         Arm Pressure (mmHG)  +----------+--------+--------+----------------+-------------------+  Subclavian 79                Multiphasic, WNL                      +----------+--------+--------+----------------+-------------------+ +---------+--------+--+--------+-+----------------------------+  Vertebral PSV cm/s 29 EDV cm/s 0 High resistant and Antegrade  +---------+--------+--+--------+-+----------------------------+   Summary: Right Carotid: The extracranial vessels were near-normal with only minimal wall                thickening or  plaque. Left Carotid: Velocities in the left ICA are consistent with a 1-39% stenosis.               The CCA and ECA were near-normal with only minimal wall thickening               or plaque. Vertebrals:  Bilateral vertebral arteries demonstrate high resistant flow. Subclavians: Normal flow hemodynamics were seen in bilateral subclavian              arteries. *See table(s) above for measurements and observations.  Electronically signed by Antony Contras MD on 12/31/2021 at 5:04:56 PM.    Final     PHYSICAL EXAM  Physical Exam  Constitutional: Appears well-developed and well-nourished.  Cardiovascular: Normal rate and regular rhythm.  Respiratory: Effort normal, non-labored breathing  Neuro: Mental Status: Patient is awake, alert, oriented to person, place, month, year, and situation. Patient is able to give a clear  and coherent history. No signs of aphasia or neglect Cranial Nerves: II: Visual Fields are full. Pupils are equal, round, and reactive to light.   III,IV, VI: EOMI without ptosis or diploplia.  V: Facial sensation is symmetric to temperature VII: Facial movement is symmetric resting and smiling VIII: Hearing is intact to voice X: Palate elevates symmetrically XI: Shoulder shrug is symmetric. XII: Tongue protrudes midline without atrophy or fasciculations.  Motor: Tone is normal. Bulk is normal. 5/5 strength was present in all four extremities.  Sensory: Sensation is symmetric to light touch and temperature in the arms and legs. No extinction to DSS present.  Deep Tendon Reflexes: 2+ and symmetric in the biceps and patellae.  Plantars: Toes are downgoing bilaterally.  Cerebellar: FNF and HKS are intact bilaterally    ASSESSMENT/PLAN Ms. Tina Mercado is a 88 y.o. female with history of arrhythmia, COPD, GERD, glaucoma, hearing loss, heart murmur, hyperlipidemia, hypertension, osteoporosis presenting with 2 hours of expressive aphasia which has resolved.  CT head showed  a right parietal infarction that is new since 2020. Consider loop recorder  Stroke:  right small subacute CR infarct concerning for cardioembolic source given chronic right parietal infarct and presentation with aphasia Code Stroke CT head -posterior right parietal infarct, appears old but new since 04/16/2019 exam MRI punctate subacute to chronic infarct in the right cerebral white matter.  Additional chronic infarcts and chronic microvascular ischemic changes MRA fetal origin of the left PCA Carotid Doppler unremarkable 2D Echo EF 60 to 65% Recommend loop recorder to rule out A-fib as outpatient. DVT no DVT LDL 238 HgbA1c 5.4 VTE prophylaxis -Lovenox aspirin 81 mg daily prior to admission, now on aspirin 81 mg daily and clopidogrel 75 mg daily for 3 weeks and then Plavix alone. Therapy recommendations: HH Disposition: Pending  Hypertension Home meds: None Stable Long-term BP goal normotensive  Hyperlipidemia Home meds: Rosuvastatin 10 mg-noncompliant LDL 238, goal < 70 Add Crestor '40mg'$   Continue statin at discharge  Arrhythmia She stated that she followed up with Dr. Philipp Ovens in the past but now follows with Dr. Stanford Breed in cardiology.   She thinks that she might have A-fib in the past but no more.   She is in agreement with loop recorder   Other Stroke Risk Factors Advanced Age >/= 15   Other Active Problems COPD Hyponatremia Glaucoma Hiatal hernia  Hospital day # 0  I had long discussion with patient and her caregiver at bedside, updated pt current condition, treatment plan and potential prognosis, and answered all the questions.  They expressed understanding and appreciation.  Neurology will sign off. Please call with questions. Pt will follow up with Dr. Rexene Alberts at [facility] in about 4 weeks. Thanks for the consult.  Rosalin Hawking, MD PhD Stroke Neurology 01/01/2022 4:31 PM    To contact Stroke Continuity provider, please refer to [email or web address] After hours, contact  General Neurology

## 2022-01-01 NOTE — Evaluation (Signed)
Physical Therapy Evaluation ? ?Patient Details ?Name: Tina Mercado ?MRN: [id number] ?DOB: [birth year] ?Today's Date: 01/01/2022 ? ?History of Present Illness ? Tina Mercado is a 88 y.o. female who was brought to ED after she had a brief episode of expressive aphasia. PMH: seasonal allergies, unspecified arrhythmia, cataracts, COPD, GERD, large hiatal hernia, glaucoma, impaired hearing, diastolic dysfunction, mitral regurgitation, heart murmur, history of anemia, hyperlipidemia, hypertension, osteoporosis, h/o L hip and pelvic fracture d/t falls over the last few years ?  ?Clinical Impression ? Pt admitted with above. PTA pt amb with rollator, was indep with dressing and bathing, and went to dinning hall for meals as she resides in [place].  Today pt requiring minA for donning underwear, min guard for ambulation and minA to get in/out of bathroom and to steady in standing during hygiene. Spoke with [name] who reports the facility can provide increased supervision for her in addition to the ladies in her all always checking in on her. Acute PT to cont to follow. ?   ? ?Recommendations for follow up therapy are one component of a multi-disciplinary discharge planning process, led by the attending physician.  Recommendations may be updated based on patient status, additional functional criteria and insurance authorization. ? ?Follow Up Recommendations Home health PT ? ?  ?Assistance Recommended at Discharge Intermittent Supervision/Assistance  ?Patient can return home with the following ? A little help with walking and/or transfers;A little help with bathing/dressing/bathroom;Assistance with feeding;Assist for transportation;Help with stairs or ramp for entrance ? ?  ?Equipment Recommendations None recommended by PT  ?Recommendations for Other Services ?    ?  ?Functional Status Assessment Patient has had a recent decline in their functional status and demonstrates the ability to make significant  improvements in function in a reasonable and predictable amount of time.  ? ?  ?Precautions / Restrictions Precautions ?Precautions: Fall ?Restrictions ?Weight Bearing Restrictions: No  ? ?  ? ?Mobility ? Bed Mobility ?Overal bed mobility: Needs Assistance ?Bed Mobility: Supine to Sit ?  ?  ?Supine to sit: Min assist ?  ?  ?General bed mobility comments: HOB elevated, minA to scoot to EOB ?  ? ?Transfers ?Overall transfer level: Needs assistance ?Equipment used: Rollator (4 wheels) ?Transfers: Sit to/from Stand ?Sit to Stand: Min assist ?  ?  ?  ?  ?  ?General transfer comment: min guard to steady, pt slow and guarded, good hand placement, braces self on bed via back of the legs ?  ? ?Ambulation/Gait ?Ambulation/Gait assistance: Min guard ?Gait Distance (Feet): 175 Feet ?Assistive device: Rollator (4 wheels) ?Gait Pattern/deviations: Step-through pattern, Decreased stride length, Trunk flexed ?Gait velocity: slow ?Gait velocity interpretation: <1.31 ft/sec, indicative of household ambulator ?  ?General Gait Details: pt with fluid gait pattern, good walker management, pt slow/guarded. pt assisted into bathroom requiring modA via HHA for safety and pt holding onto railing ? ?Stairs ?  ?  ?  ?  ?  ? ?Wheelchair Mobility ?  ? ?Modified Rankin (Stroke Patients Only) ?Modified Rankin (Stroke Patients Only) ?Pre-Morbid Rankin Score: Moderately severe disability ?Modified Rankin: Moderately severe disability ? ?  ? ?Balance Overall balance assessment: Needs assistance ?Sitting-balance support: Feet supported, No upper extremity supported ?Sitting balance-Leahy Scale: Fair ?Sitting balance - Comments: pt able to don socks at EOB without LOB, pt required some assist to done mesh underwear ?  ?Standing balance support: Bilateral upper extremity supported, During functional activity ?Standing balance-Leahy Scale: Poor ?Standing balance comment: pt braces self  on sink via trunk while washing hands. Pt had to hold onto rail in  bathroom to perform hygiene s/p urination ?  ?  ?  ?  ?  ?  ?  ?  ?  ?  ?  ?   ? ? ? ?Pertinent Vitals/Pain Pain Assessment ?Pain Assessment: No/denies pain  ? ? ?Home Living Family/patient expects to be discharged to:: Private residence (indep living at friends home) ?Living Arrangements: Alone ?Available Help at Discharge: Friend(s);Available PRN/intermittently ?Type of Home: Independent living facility ?Home Access: Level entry ?  ?  ?  ?Home Layout: One level ?Home Equipment: Rollator (4 wheels);Grab bars - tub/shower;Grab bars - toilet;Hand held shower head ?Additional Comments: lives at friends home [place], meal provided  ?  ?Prior Function Prior Level of Function : Needs assist ?  ?  ?  ?  ?  ?  ?Mobility Comments: uses rollator 100% of time ?ADLs Comments: pt reports dressing and bathing indep, goes to [employer] for meals ?  ? ? ?Hand Dominance  ? Dominant Hand: Right ? ?  ?Extremity/Trunk Assessment  ? Upper Extremity Assessment ?Upper Extremity Assessment: Generalized weakness ?  ? ?Lower Extremity Assessment ?Lower Extremity Assessment: Generalized weakness ?  ? ?Cervical / Trunk Assessment ?Cervical / Trunk Assessment: Kyphotic  ?Communication  ? Communication: HOH  ?Cognition Arousal/Alertness: Awake/alert ?Behavior During Therapy: [facility] for tasks assessed/performed ?Overall Cognitive Status: Within Functional Limits for tasks assessed ?  ?  ?  ?  ?  ?  ?  ?  ?  ?  ?  ?  ?  ?  ?  ?  ?  ?  ?  ? ?  ?General Comments General comments (skin integrity, edema, etc.): pt assisted to the bathroom, minA for transfer and to steady during standing for hygiene ? ?  ?Exercises    ? ?Assessment/Plan  ?  ?PT Assessment Patient needs continued PT services  ?PT Problem List Decreased strength;Decreased range of motion;Decreased activity tolerance;Decreased balance;Decreased mobility;Decreased coordination;Decreased knowledge of use of DME ? ?   ?  ?PT Treatment Interventions DME instruction;Gait training;Stair  training;Functional mobility training;Therapeutic activities;Therapeutic exercise;Balance training   ? ?PT Goals (Current goals can be found in the Care Plan section)  ?Acute Rehab PT Goals ?Patient Stated Goal: home ?PT Goal Formulation: With patient ?Time For Goal Achievement: 01/15/22 ?Potential to Achieve Goals: Good ? ?  ?Frequency Min 4X/week ?  ? ? ?Co-evaluation   ?  ?  ?  ?  ? ? ?  ?AM-PAC PT "6 Clicks" Mobility  ?Outcome Measure Help needed turning from your back to your side while in a flat bed without using bedrails?: A Little ?Help needed moving from lying on your back to sitting on the side of a flat bed without using bedrails?: A Little ?Help needed moving to and from a bed to a chair (including a wheelchair)?: A Little ?Help needed standing up from a chair using your arms (e.g., wheelchair or bedside chair)?: A Little ?Help needed to walk in hospital room?: A Little ?Help needed climbing 3-5 steps with a railing? : A Lot ?6 Click Score: 17 ? ?  ?End of Session Equipment Utilized During Treatment: Gait belt ?Activity Tolerance: Patient tolerated treatment well ?Patient left: in chair;with call bell/phone within reach ?Nurse Communication: Mobility status ?PT Visit Diagnosis: Unsteadiness on feet (R26.81);Muscle weakness (generalized) (M62.81);Difficulty in walking, not elsewhere classified (R26.2) ?  ? ?Time: 2979-8921 ?PT Time Calculation (min) (ACUTE ONLY): 48 min ? ? ?Charges:  PT Evaluation ?$PT Eval Moderate Complexity: 1 Mod ?PT Treatments ?$Gait Training: 8-22 mins ?$Therapeutic Activity: 8-22 mins ?  ?   ? ? ?Kittie Plater, PT, DPT ?Acute Rehabilitation Services ?Pager #: [phone] ?Office #: [phone] ? ? ?Luka Reisch M Abdulai Blaylock ?01/01/2022, 12:39 PM ? ?

## 2022-01-01 NOTE — Progress Notes (Signed)
[facility]                                                                               Tina Mercado, is a 88 y.o. female, DOB - [birth year], VOZ:[id number] Admit date - 12/31/2021    Outpatient Primary MD for the patient is System, Provider Not In  LOS - 0  days    Brief summary   88 year old lady with prior history of arrhythmia, COPD, GERD, glaucoma, hyperlipidemia, hypertension presents with expressive aphasia which appears to have resolved.   Assessment & Plan    Assessment and Plan:  Posterior right parietal infarct MRI showed subacute to chronic infarct in the right cerebellar white matter. MRA of the head and neck showed fetal origin of the left PCA. Vas carotid duplex showed left ICA consistent with 1 to 39% stenosis. Echocardiogram showed Left ventricular ejection fraction, by estimation, is 60 to 65%. The  left ventricle has normal function. The left ventricle has no regional  wall motion abnormalities. Left ventricular diastolic parameters are  indeterminate.  LDL is 238, Hemoglobin A1c at 5.4 Venous duplex of the lower extremities negative for DVT. Telemetry does not show any arrhythmias Neurology on board appreciate recommendations. Currently on aspirin 81 mg and Plavix daily.  Crestor 40 mg added.  Plan for loop recorder placement tomorrow prior to discharge. Therapy evaluation recommending home health   Essential hypertension Blood pressure parameters appear to be optimal   Hyperlipidemia LDL of 238, Continue with Crestor 40 mg daily.   COPD No wheezing heard on exam today.    GERD Stable.      Estimated body mass index is 23.04 kg/m as calculated from the following:   Height as of this encounter: '5\' 1"'$  (1.549 m).   Weight as of this encounter: 55.3 kg.  Code Status: full code.  DVT Prophylaxis:  enoxaparin (LOVENOX) injection 30 mg Start: 12/31/21 2200   Level of Care: Level of care: Telemetry Medical Family  Communication: none at bedside.   Disposition Plan:     Remains inpatient appropriate:    Procedures:  ECHO Venous shows.   Consultants:   Neurology.   Antimicrobials:   Anti-infectives (From admission, onward)    None        Medications  Scheduled Meds:   stroke: mapping our early stages of recovery book   Does not apply Once   amitriptyline  10 mg Oral QHS   aspirin EC  81 mg Oral QHS   cholecalciferol  2,000 Units Oral QHS   clopidogrel  75 mg Oral Daily   docusate sodium  200 mg Oral QHS   enoxaparin (LOVENOX) injection  30 mg Subcutaneous Q24H   ketorolac  1 drop Right Eye BID   mometasone-formoterol  2 puff Inhalation BID   pantoprazole  40 mg Oral Daily   rosuvastatin  40 mg Oral Daily   timolol  1 drop Both Eyes BID   Continuous Infusions: PRN Meds:.acetaminophen **OR** acetaminophen, ondansetron **OR** ondansetron (ZOFRAN) IV    Subjective:   Tina Mercado was seen and examined today.  Pt denies any new complaints.   Objective:   Vitals:   01/01/22 0135 01/01/22  3570 01/01/22 0758 01/01/22 1240  BP: (!) 167/78 (!) 191/76 (!) 157/95 (!) 129/57  Pulse: 63 66 65 74  Resp: '18 18 18 18  '$ Temp: 98.1 F (36.7 C) 98.7 F (37.1 C) 97.9 F (36.6 C) 98.1 F (36.7 C)  TempSrc: Oral Oral Oral Oral  SpO2: 96% 96% 97% 96%  Weight:      Height:       No intake or output data in the 24 hours ending 01/01/22 1607 Filed Weights   12/31/21 2134  Weight: 55.3 kg     Exam General exam: Appears calm and comfortable  Respiratory system: Clear to auscultation. Respiratory effort normal. Cardiovascular system: S1 & S2 heard, RRR. No JVD,  No pedal edema. Gastrointestinal system: Abdomen is nondistended, soft and nontender. Normal bowel sounds heard. Central nervous system: Alert and oriented. No focal neurological deficits. Extremities: Symmetric 5 x 5 power. Skin: No rashes, lesions or ulcers   Data Reviewed:  I have personally reviewed following labs  and imaging studies   CBC Lab Results  Component Value Date   WBC 8.0 12/31/2021   RBC 4.56 12/31/2021   HGB 14.6 12/31/2021   HCT 43.0 12/31/2021   MCV 92.8 12/31/2021   MCH 29.2 12/31/2021   PLT 204 12/31/2021   MCHC 31.4 12/31/2021   RDW 14.9 12/31/2021   LYMPHSABS 1.3 12/31/2021   MONOABS 0.7 12/31/2021   EOSABS 0.5 12/31/2021   BASOSABS 0.1 17/79/3903     Last metabolic panel Lab Results  Component Value Date   NA 138 01/01/2022   K 4.1 01/01/2022   CL 106 01/01/2022   CO2 21 (L) 01/01/2022   BUN 13 01/01/2022   CREATININE 1.08 (H) 01/01/2022   GLUCOSE 97 01/01/2022   GFRNONAA 48 (L) 01/01/2022   GFRAA >60 05/16/2020   CALCIUM 9.2 01/01/2022   PHOS 3.3 04/14/2019   PROT 6.9 12/31/2021   ALBUMIN 3.6 12/31/2021   LABGLOB 2.3 10/15/2018   AGRATIO 1.8 10/15/2018   BILITOT 1.0 12/31/2021   ALKPHOS 80 12/31/2021   AST 20 12/31/2021   ALT 10 12/31/2021   ANIONGAP 11 01/01/2022    CBG (last 3)  Recent Labs    12/31/21 2155 01/01/22 0912 01/01/22 1238  GLUCAP 107* 119* 147*      Coagulation Profile: Recent Labs  Lab 12/31/21 1353  INR 0.9     Radiology Studies: CT HEAD WO CONTRAST  Result Date: 12/31/2021 CLINICAL DATA:  TIA, expressive aphasia for couple days EXAM: CT HEAD WITHOUT CONTRAST TECHNIQUE: Contiguous axial images were obtained from the base of the skull through the vertex without intravenous contrast. RADIATION DOSE REDUCTION: This exam was performed according to the departmental dose-optimization program which includes automated exposure control, adjustment of the mA and/or kV according to patient size and/or use of iterative reconstruction technique. COMPARISON:  On 04/16/2019 FINDINGS: Brain: Generalized atrophy. Normal ventricular morphology. No midline shift or mass effect. Small vessel chronic ischemic changes of deep cerebral white matter. Posterior RIGHT parietal infarct, appears old but is new since 11/01/2018. No intracranial  hemorrhage, mass lesion, or evidence of acute infarction. No extra-axial fluid collections. Vascular: No hyperdense vessels. Atherosclerotic calcification of internal carotid arteries at skull base Skull: Demineralized but intact Sinuses/Orbits: Clear Other: N/A IMPRESSION: Atrophy with small vessel chronic ischemic changes of deep cerebral white matter. Posterior RIGHT parietal infarct, appears old but new since 04/16/2019 exam. No acute intracranial abnormalities. Electronically Signed   By: Lavonia Dana M.D.   On: 12/31/2021 14:44  MR ANGIO HEAD WO CONTRAST  Result Date: 12/31/2021 CLINICAL DATA:  Neuro deficit, acute, stroke suspected aphasia; Stroke/TIA, determine embolic source EXAM: MRI HEAD WITHOUT CONTRAST MRA HEAD WITHOUT CONTRAST TECHNIQUE: Multiplanar, multi-echo pulse sequences of the brain and surrounding structures were acquired without intravenous contrast. Angiographic images of the Circle of Willis were acquired using MRA technique without intravenous contrast. COMPARISON:  No pertinent prior exam. FINDINGS: MRI HEAD Brain: A punctate focus of mild diffusion hyperintensity in the right centrum semiovale has corresponding ADC isointensity. Chronic right parietal infarct extending to the temporal lobe junction. Additional patchy and confluent areas of T2 hyperintensity in the supratentorial white matter are nonspecific but may reflect mild chronic microvascular ischemic changes. There are small chronic bilateral cerebellar infarcts. Small chronic left parietal cortical infarct. No evidence of intracranial hemorrhage. No intracranial mass or mass effect. There is no hydrocephalus or extra-axial fluid collection. Vascular: Major vessel flow voids at the skull base are preserved. Skull and upper cervical spine: Normal marrow signal is preserved. Sinuses/Orbits: Right lens replacement.  Orbits are unremarkable. Other: Sella is unremarkable.  Mastoid air cells are clear. MRA HEAD Intracranial internal  carotid arteries are patent with mild atherosclerotic irregularity. Middle and anterior cerebral arteries are patent. Intracranial vertebral arteries, basilar artery, posterior cerebral arteries are patent. Left larger than right posterior communicating arteries are present. Fetal origin of the left PCA. There is no significant stenosis or aneurysm. IMPRESSION: No acute infarct, hemorrhage, or mass. Punctate subacute to chronic infarct of the right cerebral white matter. Additional chronic infarcts and chronic microvascular ischemic changes. No proximal intracranial vessel occlusion or significant stenosis. Electronically Signed   By: Macy Mis M.D.   On: 12/31/2021 17:55   MR BRAIN WO CONTRAST  Result Date: 12/31/2021 CLINICAL DATA:  Neuro deficit, acute, stroke suspected aphasia; Stroke/TIA, determine embolic source EXAM: MRI HEAD WITHOUT CONTRAST MRA HEAD WITHOUT CONTRAST TECHNIQUE: Multiplanar, multi-echo pulse sequences of the brain and surrounding structures were acquired without intravenous contrast. Angiographic images of the Circle of Willis were acquired using MRA technique without intravenous contrast. COMPARISON:  No pertinent prior exam. FINDINGS: MRI HEAD Brain: A punctate focus of mild diffusion hyperintensity in the right centrum semiovale has corresponding ADC isointensity. Chronic right parietal infarct extending to the temporal lobe junction. Additional patchy and confluent areas of T2 hyperintensity in the supratentorial white matter are nonspecific but may reflect mild chronic microvascular ischemic changes. There are small chronic bilateral cerebellar infarcts. Small chronic left parietal cortical infarct. No evidence of intracranial hemorrhage. No intracranial mass or mass effect. There is no hydrocephalus or extra-axial fluid collection. Vascular: Major vessel flow voids at the skull base are preserved. Skull and upper cervical spine: Normal marrow signal is preserved. Sinuses/Orbits:  Right lens replacement.  Orbits are unremarkable. Other: Sella is unremarkable.  Mastoid air cells are clear. MRA HEAD Intracranial internal carotid arteries are patent with mild atherosclerotic irregularity. Middle and anterior cerebral arteries are patent. Intracranial vertebral arteries, basilar artery, posterior cerebral arteries are patent. Left larger than right posterior communicating arteries are present. Fetal origin of the left PCA. There is no significant stenosis or aneurysm. IMPRESSION: No acute infarct, hemorrhage, or mass. Punctate subacute to chronic infarct of the right cerebral white matter. Additional chronic infarcts and chronic microvascular ischemic changes. No proximal intracranial vessel occlusion or significant stenosis. Electronically Signed   By: Macy Mis M.D.   On: 12/31/2021 17:55   ECHOCARDIOGRAM COMPLETE  Result Date: 01/01/2022    ECHOCARDIOGRAM REPORT  Patient Name:   Tina Mercado Date of Exam: 01/01/2022 Medical Rec #:  [id number]         Height:       61.0 in Accession #:    [id number]        Weight:       121.9 lb Date of Birth:  Apr 13, 1929         BSA:          1.530 m Patient Age:    17 years          BP:           191/76 mmHg Patient Gender: F                 HR:           62 bpm. Exam Location:  Inpatient Procedure: 2D Echo, Cardiac Doppler and Color Doppler Indications:    G45.9 TIA  History:        Patient has prior history of Echocardiogram examinations, most                 recent 02/24/2020. COPD, Mitral Valve Disease,                 Signs/Symptoms:Murmur; Risk Factors:Hypertension and                 Dyslipidemia.  Sonographer:    Beryle Beams Referring Phys: [id number] DAVID MANUEL ORTIZ  Sonographer Comments: Suboptimal parasternal window, suboptimal apical window and suboptimal subcostal window. IVC NOT SEEN IMPRESSIONS  1. Left ventricular ejection fraction, by estimation, is 60 to 65%. The left ventricle has normal function. The left ventricle has no  regional wall motion abnormalities. Left ventricular diastolic parameters are indeterminate.  2. Right ventricular systolic function is normal. The right ventricular size is normal.  3. Left atrial size was mildly dilated.  4. The mitral valve is grossly normal. Trivial mitral valve regurgitation. No evidence of mitral stenosis.  5. The aortic valve was not well visualized. Aortic valve regurgitation is not visualized. Comparison(s): No significant change from prior study. Conclusion(s)/Recommendation(s): Otherwise normal echocardiogram, with minor abnormalities described in the report. Overall poor windows due to COPD, but no high risk findings noted. FINDINGS  Left Ventricle: Left ventricular ejection fraction, by estimation, is 60 to 65%. The left ventricle has normal function. The left ventricle has no regional wall motion abnormalities. The left ventricular internal cavity size was normal in size. There is  borderline left ventricular hypertrophy. Left ventricular diastolic parameters are indeterminate. Right Ventricle: The right ventricular size is normal. Right vetricular wall thickness was not well visualized. Right ventricular systolic function is normal. Left Atrium: Left atrial size was mildly dilated. Right Atrium: Right atrial size was normal in size. Pericardium: There is no evidence of pericardial effusion. Mitral Valve: The mitral valve is grossly normal. There is mild thickening of the mitral valve leaflet(s). Trivial mitral valve regurgitation. No evidence of mitral valve stenosis. Tricuspid Valve: The tricuspid valve is grossly normal. Tricuspid valve regurgitation is trivial. No evidence of tricuspid stenosis. Aortic Valve: The aortic valve was not well visualized. Aortic valve regurgitation is not visualized. Aortic valve mean gradient measures 4.0 mmHg. Aortic valve peak gradient measures 6.7 mmHg. Aortic valve area, by VTI measures 2.13 cm. Pulmonic Valve: The pulmonic valve was not well  visualized. Pulmonic valve regurgitation is not visualized. No evidence of pulmonic stenosis. Aorta: The aortic arch was not well visualized, the ascending aorta was not well visualized  and the aortic root is normal in size and structure. Venous: The inferior vena cava was not well visualized. IAS/Shunts: The interatrial septum was not well visualized.  LEFT VENTRICLE PLAX 2D LVIDd:         3.20 cm     Diastology LVIDs:         1.70 cm     LV e' medial:    4.03 cm/s LV PW:         1.10 cm     LV E/e' medial:  21.0 LV IVS:        1.10 cm     LV e' lateral:   4.03 cm/s LVOT diam:     1.80 cm     LV E/e' lateral: 21.0 LV SV:         61 LV SV Index:   40 LVOT Area:     2.54 cm  LV Volumes (MOD) LV vol d, MOD A2C: 54.1 ml LV vol d, MOD A4C: 58.6 ml LV vol s, MOD A2C: 26.5 ml LV vol s, MOD A4C: 29.2 ml LV SV MOD A2C:     27.6 ml LV SV MOD A4C:     58.6 ml LV SV MOD BP:      29.0 ml RIGHT VENTRICLE RV S prime:     10.80 cm/s TAPSE (M-mode): 1.9 cm LEFT ATRIUM             Index        RIGHT ATRIUM          Index LA diam:        3.70 cm 2.42 cm/m   RA Area:     9.72 cm LA Vol (A2C):   40.0 ml 26.14 ml/m  RA Volume:   18.30 ml 11.96 ml/m LA Vol (A4C):   31.7 ml 20.71 ml/m LA Biplane Vol: 36.8 ml 24.05 ml/m  AORTIC VALVE                    PULMONIC VALVE AV Area (Vmax):    1.99 cm     PV Vmax:       0.54 m/s AV Area (Vmean):   1.93 cm     PV Vmean:      37.700 cm/s AV Area (VTI):     2.13 cm     PV VTI:        0.158 m AV Vmax:           129.00 cm/s  PV Peak grad:  1.1 mmHg AV Vmean:          89.600 cm/s  PV Mean grad:  1.0 mmHg AV VTI:            0.288 m AV Peak Grad:      6.7 mmHg AV Mean Grad:      4.0 mmHg LVOT Vmax:         101.00 cm/s LVOT Vmean:        68.100 cm/s LVOT VTI:          0.241 m LVOT/AV VTI ratio: 0.84  AORTA Ao Root diam: 2.50 cm MITRAL VALVE MV Area (PHT): 1.62 cm     SHUNTS MV Decel Time: 468 msec     Systemic VTI:  0.24 m MR Peak grad: 72.6 mmHg     Systemic Diam: 1.80 cm MR Mean grad: 32.0  mmHg MR Vmax:      426.00 cm/s MR Vmean:     253.0 cm/s MV  E velocity: 84.70 cm/s MV A velocity: 143.00 cm/s MV E/A ratio:  0.59 Buford Dresser MD Electronically signed by Buford Dresser MD Signature Date/Time: 01/01/2022/10:34:19 AM    Final    VAS US CAROTID  Result Date: 12/31/2021 Carotid Arterial Duplex Study Patient Name:  Tina Mercado  Date of Exam:   12/31/2021 Medical Rec #: [id number]          Accession #:    [id number] Date of Birth: 01/09/1929          Patient Gender: F Patient Age:   65 years Exam Location:  [facility] Procedure:      VAS US CAROTID Referring Phys: DAVID ORTIZ --------------------------------------------------------------------------------  Indications:       TIA. Risk Factors:      Hypertension, hyperlipidemia, no history of smoking. Comparison Study:  No previous exams Performing Technologist: Jody Hill RVT, RDMS  Examination Guidelines: A complete evaluation includes B-mode imaging, spectral Doppler, color Doppler, and power Doppler as needed of all accessible portions of each vessel. Bilateral testing is considered an integral part of a complete examination. Limited examinations for reoccurring indications may be performed as noted.  Right Carotid Findings: +----------+--------+--------+--------+------------------+------------------+             PSV cm/s EDV cm/s Stenosis Plaque Description Comments            +----------+--------+--------+--------+------------------+------------------+  CCA Prox   47       6                                                        +----------+--------+--------+--------+------------------+------------------+  CCA Distal 45       9                                    intimal thickening  +----------+--------+--------+--------+------------------+------------------+  ICA Prox   42       10                                                       +----------+--------+--------+--------+------------------+------------------+  ICA  Distal 78       20                                                       +----------+--------+--------+--------+------------------+------------------+  ECA        57       0                                    intimal thickening  +----------+--------+--------+--------+------------------+------------------+ +----------+--------+-------+----------------+-------------------+             PSV cm/s EDV cms Describe         Arm Pressure (mmHG)  +----------+--------+-------+----------------+-------------------+  Subclavian 97               Multiphasic, WNL                      +----------+--------+-------+----------------+-------------------+ +---------+--------+--+--------+-+----------------------------+  Vertebral PSV cm/s 41 EDV cm/s 0 High resistant and Antegrade  +---------+--------+--+--------+-+----------------------------+  Left Carotid Findings: +----------+--------+--------+--------+------------------+------------------+             PSV cm/s EDV cm/s Stenosis Plaque Description Comments            +----------+--------+--------+--------+------------------+------------------+  CCA Prox   63       8                                                        +----------+--------+--------+--------+------------------+------------------+  CCA Distal 46       8                                    intimal thickening  +----------+--------+--------+--------+------------------+------------------+  ICA Prox   49       12       1-39%    calcific                               +----------+--------+--------+--------+------------------+------------------+  ICA Distal 65       13                                                       +----------+--------+--------+--------+------------------+------------------+  ECA        99       0                 calcific                               +----------+--------+--------+--------+------------------+------------------+ +----------+--------+--------+----------------+-------------------+              PSV cm/s EDV cm/s Describe         Arm Pressure (mmHG)  +----------+--------+--------+----------------+-------------------+  Subclavian 79                Multiphasic, WNL                      +----------+--------+--------+----------------+-------------------+ +---------+--------+--+--------+-+----------------------------+  Vertebral PSV cm/s 29 EDV cm/s 0 High resistant and Antegrade  +---------+--------+--+--------+-+----------------------------+   Summary: Right Carotid: The extracranial vessels were near-normal with only minimal wall                thickening or plaque. Left Carotid: Velocities in the left ICA are consistent with a 1-39% stenosis.               The CCA and ECA were near-normal with only minimal wall thickening               or plaque. Vertebrals:  Bilateral vertebral arteries demonstrate high resistant flow. Subclavians: Normal flow hemodynamics were seen in bilateral subclavian              arteries. *See table(s) above for measurements and observations.  Electronically signed by Antony Contras MD on 12/31/2021 at 72:04:56 PM.    Final    VAS [place] LOWER EXTREMITY VENOUS (DVT)  Result Date: 01/01/2022  Lower  Venous DVT Study Patient Name:  Tina Mercado  Date of Exam:   01/01/2022 Medical Rec #: [id number]          Accession #:    [id number] Date of Birth: 1929-09-07          Patient Gender: F Patient Age:   16 years Exam Location:  [facility] Procedure:      VAS [place] LOWER EXTREMITY VENOUS (DVT) Referring Phys: Cornelius Moras XU --------------------------------------------------------------------------------  Indications: Stroke.  Comparison Study: no prior Performing Technologist: Archie Patten RVS  Examination Guidelines: A complete evaluation includes B-mode imaging, spectral Doppler, color Doppler, and power Doppler as needed of all accessible portions of each vessel. Bilateral testing is considered an integral part of a complete examination. Limited examinations for reoccurring indications  may be performed as noted. The reflux portion of the exam is performed with the patient in reverse Trendelenburg.  +---------+---------------+---------+-----------+----------+--------------+  RIGHT     Compressibility Phasicity Spontaneity Properties Thrombus Aging  +---------+---------------+---------+-----------+----------+--------------+  CFV       Full            Yes       Yes                                    +---------+---------------+---------+-----------+----------+--------------+  SFJ       Full                                                             +---------+---------------+---------+-----------+----------+--------------+  FV Prox   Full                                                             +---------+---------------+---------+-----------+----------+--------------+  FV Mid    Full                                                             +---------+---------------+---------+-----------+----------+--------------+  FV Distal Full                                                             +---------+---------------+---------+-----------+----------+--------------+  PFV       Full                                                             +---------+---------------+---------+-----------+----------+--------------+  POP       Full            Yes  Yes                                    +---------+---------------+---------+-----------+----------+--------------+  PTV       Full                                                             +---------+---------------+---------+-----------+----------+--------------+  PERO      Full                                                             +---------+---------------+---------+-----------+----------+--------------+   +---------+---------------+---------+-----------+----------+--------------+  LEFT      Compressibility Phasicity Spontaneity Properties Thrombus Aging  +---------+---------------+---------+-----------+----------+--------------+  CFV        Full            Yes       Yes                                    +---------+---------------+---------+-----------+----------+--------------+  SFJ       Full                                                             +---------+---------------+---------+-----------+----------+--------------+  FV Prox   Full                                                             +---------+---------------+---------+-----------+----------+--------------+  FV Mid    Full                                                             +---------+---------------+---------+-----------+----------+--------------+  FV Distal Full                                                             +---------+---------------+---------+-----------+----------+--------------+  PFV       Full                                                             +---------+---------------+---------+-----------+----------+--------------+  POP       Full            Yes       Yes                                    +---------+---------------+---------+-----------+----------+--------------+  PTV       Full                                                             +---------+---------------+---------+-----------+----------+--------------+  PERO      Full                                                             +---------+---------------+---------+-----------+----------+--------------+     Summary: BILATERAL: - No evidence of deep vein thrombosis seen in the lower extremities, bilaterally. -No evidence of popliteal cyst, bilaterally.   *See table(s) above for measurements and observations.    Preliminary        Hosie Poisson M.D. [facility] 01/01/2022, 4:07 PM  Available via Epic secure chat 7am-7pm After 7 pm, please refer to night coverage provider listed on amion.

## 2022-01-02 ENCOUNTER — Encounter (HOSPITAL_COMMUNITY): Payer: Self-pay | Admitting: Cardiology

## 2022-01-02 ENCOUNTER — Encounter (HOSPITAL_COMMUNITY)
Admission: EM | Disposition: A | Discharge status: Home Health | Payer: Self-pay | Source: Home / Self Care | Attending: Emergency Medicine

## 2022-01-02 DIAGNOSIS — G459 Transient cerebral ischemic attack, unspecified: Secondary | ICD-10-CM | POA: Diagnosis not present

## 2022-01-02 DIAGNOSIS — I639 Cerebral infarction, unspecified: Secondary | ICD-10-CM

## 2022-01-02 DIAGNOSIS — J449 Chronic obstructive pulmonary disease, unspecified: Secondary | ICD-10-CM | POA: Diagnosis not present

## 2022-01-02 DIAGNOSIS — I10 Essential (primary) hypertension: Secondary | ICD-10-CM | POA: Diagnosis not present

## 2022-01-02 HISTORY — PX: LOOP RECORDER INSERTION: EP1214

## 2022-01-02 LAB — GLUCOSE, CAPILLARY
Glucose-Capillary: 108 mg/dL — ABNORMAL HIGH (ref 70–99)
Glucose-Capillary: 118 mg/dL — ABNORMAL HIGH (ref 70–99)
Glucose-Capillary: 117 mg/dL — ABNORMAL HIGH (ref 70–99)

## 2022-01-02 SURGERY — LOOP RECORDER INSERTION

## 2022-01-02 MED ORDER — ROSUVASTATIN CALCIUM 40 MG PO TABS: 40.0000 mg | ORAL_TABLET | Freq: Every day | ORAL | 2 refills | Status: DC

## 2022-01-02 MED ORDER — ASPIRIN EC 81 MG PO TBEC: 81.0000 mg | DELAYED_RELEASE_TABLET | Freq: Every day | ORAL | 0 refills | Status: AC

## 2022-01-02 MED ORDER — LIDOCAINE-EPINEPHRINE 1 %-1:100000 IJ SOLN
INTRAMUSCULAR | Status: DC | PRN
  Administered 2022-01-02: 20 mL

## 2022-01-02 MED ORDER — LIDOCAINE-EPINEPHRINE 1 %-1:100000 IJ SOLN
INTRAMUSCULAR | Status: AC
  Filled 2022-01-02: qty 1

## 2022-01-02 MED ORDER — CLOPIDOGREL BISULFATE 75 MG PO TABS: 75.0000 mg | ORAL_TABLET | Freq: Every day | ORAL | 2 refills | Status: DC

## 2022-01-02 SURGICAL SUPPLY — 2 items
MONITOR CARDIAC LUX-DX INSERT (Prosthesis & Implant Heart) ×1 IMPLANT
PACK LOOP INSERTION (CUSTOM PROCEDURE TRAY) ×2 IMPLANT

## 2022-01-02 NOTE — Plan of Care (Signed)
?  Problem: Education: ?Goal: Knowledge of General Education information will improve ?Description: Including pain rating scale, medication(s)/side effects and non-pharmacologic comfort measures ?Outcome: Adequate for Discharge ?  ?Problem: Health Behavior/Discharge Planning: ?Goal: Ability to manage health-related needs will improve ?Outcome: Adequate for Discharge ?  ?Problem: Clinical Measurements: ?Goal: Ability to maintain clinical measurements within normal limits will improve ?Outcome: Adequate for Discharge ?Goal: Will remain free from infection ?Outcome: Adequate for Discharge ?Goal: Diagnostic test results will improve ?Outcome: Adequate for Discharge ?Goal: Respiratory complications will improve ?Outcome: Adequate for Discharge ?Goal: Cardiovascular complication will be avoided ?Outcome: Adequate for Discharge ?  ?Problem: Activity: ?Goal: Risk for activity intolerance will decrease ?Outcome: Adequate for Discharge ?  ?Problem: Nutrition: ?Goal: Adequate nutrition will be maintained ?Outcome: Adequate for Discharge ?  ?Problem: Coping: ?Goal: Level of anxiety will decrease ?Outcome: Adequate for Discharge ?  ?Problem: Elimination: ?Goal: Will not experience complications related to bowel motility ?Outcome: Adequate for Discharge ?Goal: Will not experience complications related to urinary retention ?Outcome: Adequate for Discharge ?  ?Problem: Pain Managment: ?Goal: General experience of comfort will improve ?Outcome: Adequate for Discharge ?  ?Problem: Safety: ?Goal: Ability to remain free from injury will improve ?Outcome: Adequate for Discharge ?  ?Problem: Skin Integrity: ?Goal: Risk for impaired skin integrity will decrease ?Outcome: Adequate for Discharge ?  ?Problem: Education: ?Goal: Knowledge of disease or condition will improve ?Outcome: Adequate for Discharge ?Goal: Knowledge of secondary prevention will improve (SELECT ALL) ?Outcome: Adequate for Discharge ?Goal: Individualized Educational  Video(s) ?Outcome: Adequate for Discharge ?  ?Problem: Health Behavior/Discharge Planning: ?Goal: Ability to manage health-related needs will improve ?Outcome: Adequate for Discharge ?  ?Problem: Self-Care: ?Goal: Ability to participate in self-care as condition permits will improve ?Outcome: Adequate for Discharge ?  ?Problem: Ischemic Stroke/TIA Tissue Perfusion: ?Goal: Complications of ischemic stroke/TIA will be minimized ?Outcome: Adequate for Discharge ?  ?

## 2022-01-02 NOTE — Discharge Instructions (Signed)
Care After Your Loop Recorder ? ?You have a [employer] Loop Recorder ?  ?Monitor your cardiac device site for redness, swelling, and drainage. Call the device clinic at [phone] if you experience these symptoms or fever/chills. ? ?You may shower 3 days after your loop recorder implant and wash your incision with soap and water. Avoid lotions, ointments, or perfumes over your incision until it is well-healed. ? ?You may use a hot tub or a pool AFTER your wound check appointment if the incision is completely closed. ? ?Your device is MRI compatible.  ? ?Remote monitoring is used to monitor your cardiac device from home. This monitoring is scheduled every month days by our office. It allows us to keep an eye on the functioning of your device to ensure it is working properly. ? ?

## 2022-01-02 NOTE — Discharge Summary (Addendum)
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   +---------+---------------+---------+-----------+----------+--------------+  LEFT      Compressibility Phasicity Spontaneity Properties Thrombus Aging  +---------+---------------+---------+-----------+----------+--------------+  CFV       Full            Yes       Yes                                    +---------+---------------+---------+-----------+----------+--------------+  SFJ       Full                                                             +---------+---------------+---------+-----------+----------+--------------+  FV Prox   Full                                                             +---------+---------------+---------+-----------+----------+--------------+  FV Mid    Full                                                             +---------+---------------+---------+-----------+----------+--------------+  FV Distal Full                                                              +---------+---------------+---------+-----------+----------+--------------+  PFV       Full                                                             +---------+---------------+---------+-----------+----------+--------------+  POP       Full            Yes       Yes                                    +---------+---------------+---------+-----------+----------+--------------+  PTV       Full                                                             +---------+---------------+---------+-----------+----------+--------------+  PERO      Full                                                             +---------+---------------+---------+-----------+----------+--------------+     Summary: BILATERAL: - No evidence of deep vein thrombosis seen in the lower extremities, bilaterally. -No evidence of popliteal cyst, bilaterally.   *See table(s) above for measurements and observations. Electronically signed by Monica Martinez MD on 01/01/2022 at 5:13:21 PM.    Final     Microbiology: Results for orders placed or performed during the hospital encounter of 12/31/21  Resp Panel by RT-PCR (Flu A&B, Covid) Nasopharyngeal Swab     Status: None   Collection Time: 12/31/21  1:53 PM   Specimen: Nasopharyngeal Swab; Nasopharyngeal(NP) swabs in vial transport medium  Result Value Ref Range Status   SARS Coronavirus 2 by RT PCR NEGATIVE NEGATIVE Final    Comment: (NOTE) SARS-CoV-2 target nucleic acids are NOT DETECTED.  The SARS-CoV-2 RNA is generally detectable in upper respiratory specimens during the acute phase of infection. The lowest concentration of SARS-CoV-2 viral copies this assay can detect is 138 copies/mL. A negative result does not preclude SARS-Cov-2 infection and should not be used as the sole basis for treatment or other patient management decisions. A negative result may occur with  improper specimen collection/handling, submission of specimen other than nasopharyngeal swab, presence of viral  mutation(s) within the areas targeted by this assay, and inadequate number of viral copies(<138 copies/mL). A negative result must be combined with clinical observations, patient history, and epidemiological information. The expected result is Negative.  Fact Sheet for Patients:  EntrepreneurPulse.com.au  Fact Sheet for Healthcare Providers:  IncredibleEmployment.be  This test is no t yet approved or cleared by the [place] FDA and  has been authorized for detection and/or diagnosis of SARS-CoV-2 by FDA under an Emergency Use Authorization (EUA). This EUA will remain  in effect (meaning this test can be used) for the duration of the COVID-19 declaration under Section 564(b)(1) of the Act, 21 U.S.C.section 360bbb-3(b)(1), unless the authorization is terminated  or revoked sooner.  Influenza A by PCR NEGATIVE NEGATIVE Final   Influenza B by PCR NEGATIVE NEGATIVE Final    Comment: (NOTE) The Xpert Xpress SARS-CoV-2/FLU/RSV plus assay is intended as an aid in the diagnosis of influenza from Nasopharyngeal swab specimens and should not be used as a sole basis for treatment. Nasal washings and aspirates are unacceptable for Xpert Xpress SARS-CoV-2/FLU/RSV testing.  Fact Sheet for Patients: EntrepreneurPulse.com.au  Fact Sheet for Healthcare Providers: IncredibleEmployment.be  This test is not yet approved or cleared by the [place] FDA and has been authorized for detection and/or diagnosis of SARS-CoV-2 by FDA under an Emergency Use Authorization (EUA). This EUA will remain in effect (meaning this test can be used) for the duration of the COVID-19 declaration under Section 564(b)(1) of the Act, 21 U.S.C. section 360bbb-3(b)(1), unless the authorization is terminated or revoked.  Performed at [facility], [place] [address], [place], [place] [address]     Labs: CBC: Recent  Labs  Lab 12/31/21 1353 12/31/21 1414  WBC 8.0  --   NEUTROABS 5.5  --   HGB 13.3 14.6  HCT 42.3 43.0  MCV 92.8  --   PLT 204  --    Basic Metabolic Panel: Recent Labs  Lab 12/31/21 1353 12/31/21 1414 01/01/22 0141  NA 133* 136 138  K 4.4 6.8* 4.1  CL 105 108 106  CO2 21*  --  21*  GLUCOSE 104* 105* 97  BUN 20 26* 13  CREATININE 1.20* 1.20* 1.08*  CALCIUM 8.9  --  9.2   Liver Function Tests: Recent Labs  Lab 12/31/21 1353  AST 20  ALT 10  ALKPHOS 80  BILITOT 1.0  PROT 6.9  ALBUMIN 3.6   CBG: Recent Labs  Lab 01/01/22 1238 01/01/22 1742 01/01/22 2118 01/02/22 0637 01/02/22 1217  GLUCAP 147* 124* 126* 108* 118*    Discharge time spent:  20mn   Signed: VHosie Poisson MD [facility] 01/02/2022

## 2022-01-02 NOTE — NC FL2 (Signed)
?[place] MEDICAID FL2 LEVEL OF CARE SCREENING TOOL  ?  ? ?IDENTIFICATION  ?Patient Name: ?Tina Mercado Birthdate: 1929-02-22 Sex: female Admission Date (Current Location): ?12/31/2021  ?[place] and [place] Number: ? [place] ?  Facility and Address:  ?[facility], [place] [address], [place], [place] [address] ?     Provider Number: ?[id number]  ?Attending Physician Name and Address:  ?Hosie Poisson, MD ? Relative Name and Phone Number:  ?  ?   ?Current Level of Care: ?Hospital Recommended Level of Care: ?Assisted Living Facility Prior Approval Number: ?  ? ?Date Approved/Denied: ?  PASRR Number: ?  ? ?Discharge Plan: ?Other (Comment) (ALF) ?  ? ?Current Diagnoses: ?Patient Active Problem List  ? Diagnosis Date Noted  ? TIA (transient ischemic attack) 12/31/2021  ? Glaucoma 12/31/2021  ? Edema 06/20/2020  ? UTI (urinary tract infection) 05/31/2020  ? Slow transit constipation 05/17/2020  ? Dry eyes 05/17/2020  ? NAGMA 05/11/2020  ? Hip fracture ([place]) 05/11/2020  ? Closed fracture of left inferior pubic ramus ([place])   ? Fall   ? Nondisplaced fracture of left inferior pubic rami 05/10/2020  ? Suspected COVID-19 virus infection 04/19/2019  ? Adult failure to thrive syndrome 04/18/2019  ? Acute respiratory failure with hypoxia ([place]) 04/16/2019  ? HCAP (healthcare-associated pneumonia) 04/16/2019  ? Anorexia 04/16/2019  ? Hypertension   ? Hyponatremia 04/10/2019  ? Generalized weakness 04/10/2019  ? Hypoxemia 04/10/2019  ? Prediabetes 09/28/2017  ? Dizziness 09/15/2017  ? Neuropathy 07/16/2017  ? COPD mixed type ([place]) 04/10/2015  ? Laryngopharyngeal reflux (LPR) 04/10/2015  ? Chronic laryngitis 08/17/2014  ? Blindness and low vision 04/13/2013  ? Osteoporosis 12/23/2012  ? Bronchitis 10/30/2011  ? Benign hypertensive heart disease without heart failure 08/05/2011  ? Pure hypercholesterolemia 08/05/2011  ? Mitral regurgitation   ? History of anemia   ? History of diastolic dysfunction   ? GERD  (gastroesophageal reflux disease)   ? History of seasonal allergies   ? Large hiatal hernia 11/23/2004  ? ? ?Orientation RESPIRATION BLADDER Height & Weight   ?  ?Self, Time, Situation, Place ? Normal Incontinent, Continent (incontinent at times) Weight: 121 lb 14.6 oz (55.3 kg) ?Height:  '5\' 1"'$  (154.9 cm)  ?BEHAVIORAL SYMPTOMS/MOOD NEUROLOGICAL BOWEL NUTRITION STATUS  ?    Continent Diet (low sodium heart healthy)  ?AMBULATORY STATUS COMMUNICATION OF NEEDS Skin   ?Limited Assist Verbally Surgical wounds ?  ?  ?  ?    ?     ?     ? ? ?Personal Care Assistance Level of Assistance  ?Bathing, Feeding, Dressing Bathing Assistance: Limited assistance ?Feeding assistance: Independent ?Dressing Assistance: Limited assistance ?   ? ?Functional Limitations Info  ?Hearing   ?Hearing Info: Impaired (hard of hearing) ?   ? ? ?SPECIAL CARE FACTORS FREQUENCY  ?PT (By licensed PT), OT (By licensed OT), Speech therapy   ?  ?PT Frequency: 3x/wk with home health ?OT Frequency: 3x/wk with home health ?  ?  ?Speech Therapy Frequency: 3x/wk with home health ?   ? ? ?Contractures Contractures Info: Not present  ? ? ?Additional Factors Info  ?Code Status, Allergies Code Status Info: Full ?Allergies Info: Contrast Media (Iodinated Contrast Media), Antihistamines, Diphenhydramine-type, Celebrex (Celecoxib), Darvocet (Propoxyphene N-acetaminophen), Demerol, Diphenhydramine Hcl, Feldene (Piroxicam), Gabapentin, Meperidine, Norvasc (Amlodipine Besylate), Propoxyphene, Iodine-131 ?  ?  ?  ?   ? ?Current Medications (01/02/2022):  This is the current hospital active medication list ?Current Facility-Administered Medications  ?  Medication Dose Route Frequency Provider Last Rate Last Admin  ?  stroke: mapping our early stages of recovery book   Does not apply Once Reubin Milan, MD      ? acetaminophen (TYLENOL) tablet 650 mg  650 mg Oral Q6H PRN Reubin Milan, MD      ? Or  ? acetaminophen (TYLENOL) suppository 650 mg  650 mg Rectal Q6H  PRN Reubin Milan, MD      ? amitriptyline Madelin Headings) tablet 10 mg  10 mg Oral QHS Reubin Milan, MD   10 mg at 01/01/22 2028  ? aspirin EC tablet 81 mg  81 mg Oral QHS Reubin Milan, MD   81 mg at 01/01/22 2029  ? cholecalciferol (VITAMIN D3) tablet 2,000 Units  2,000 Units Oral QHS Reubin Milan, MD   2,000 Units at 01/01/22 2029  ? clopidogrel (PLAVIX) tablet 75 mg  75 mg Oral Daily Rosalin Hawking, MD   75 mg at 01/02/22 1025  ? docusate sodium (COLACE) capsule 200 mg  200 mg Oral QHS Reubin Milan, MD   200 mg at 01/01/22 2027  ? enoxaparin (LOVENOX) injection 30 mg  30 mg Subcutaneous Q24H Reubin Milan, MD   30 mg at 01/01/22 2027  ? ketorolac (ACULAR) 0.5 % ophthalmic solution 1 drop  1 drop Right Eye BID Reubin Milan, MD   1 drop at 01/02/22 1024  ? mometasone-formoterol (DULERA) 200-5 MCG/ACT inhaler 2 puff  2 puff Inhalation BID Reubin Milan, MD   2 puff at 01/02/22 0747  ? ondansetron (ZOFRAN) tablet 4 mg  4 mg Oral Q6H PRN Reubin Milan, MD      ? Or  ? ondansetron [facility]) injection 4 mg  4 mg Intravenous Q6H PRN Reubin Milan, MD      ? pantoprazole (PROTONIX) EC tablet 40 mg  40 mg Oral Daily Reubin Milan, MD   40 mg at 01/02/22 1025  ? rosuvastatin (CRESTOR) tablet 40 mg  40 mg Oral Daily Rosalin Hawking, MD   40 mg at 01/02/22 1025  ? timolol (TIMOPTIC) 0.5 % ophthalmic solution 1 drop  1 drop Both Eyes BID Reubin Milan, MD   1 drop at 01/02/22 1024  ? ? ? ?Discharge Medications: ?Please see discharge summary for a list of discharge medications. ? ?Relevant Imaging Results: ? ?Relevant Lab Results: ? ? ?Additional Information ?SS#: [id number] ? ?Geralynn Ochs, LCSW ? ? ? ? ?

## 2022-01-02 NOTE — TOC Transition Note (Signed)
Transition of Care (TOC) - CM/SW Discharge Note ? ? ?Patient Details  ?Name: Tina Mercado ?MRN: [id number] ?Date of Birth: 08/07/1929 ? ?Transition of Care [facility]) CM/SW Contact:  ?Geralynn Ochs, LCSW ?Phone Number: ?01/02/2022, 2:06 PM ? ? ?Clinical Narrative:   CSW spoke with [name] at [facility] this morning, she talked with patient who was still not agreeable to ALF. 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 MD talked with patient and she is agreeable to stay at ALF for a week, and then go back to her own apartment. Patient has a friend who will pick her up and take her to [facility]. CSW sent home health orders to [facility] for them to setup [facility] with [facility].  ? ?Nurse to call report to [phone], [place] Room [phone]. ? ? ? ?Final next level of care: Assisted Living ?Barriers to Discharge: Barriers Resolved ? ? ?Patient Goals and CMS Choice ?Patient states their goals for this hospitalization and ongoing recovery are:: to get back home ?CMS Medicare.gov Compare Post Acute Care list provided to:: Patient ?Choice offered to / list presented to : Patient ? ?Discharge Placement ?  ?           ?Patient chooses bed at: [facility] ?Patient to be transferred to facility by: Family ?Name of family member notified: Self ?Patient and family notified of of transfer: 01/02/22 ? ?Discharge Plan and Services ?  ?  ?           ?  ?  ?  ?  ?  ?HH Arranged: PT, OT, RN, Speech Therapy ?[place] Agency: Other - See comment Ellender Hose) ?  ?  ?  ? ?Social Determinants of Health (SDOH) Interventions ?  ? ? ?Readmission Risk Interventions ?No flowsheet data found. ? ? ? ? ?

## 2022-01-02 NOTE — Care Management Obs Status (Signed)
MEDICARE OBSERVATION STATUS NOTIFICATION ? ? ?Patient Details  ?Name: Tina Mercado ?MRN: [id number] ?Date of Birth: [birth year] ? ? ?Medicare Observation Status Notification Given:  Yes ? ? ? ?[place], LCSW ?01/02/2022, 1:54 PM ?

## 2022-01-02 NOTE — Progress Notes (Signed)
Physical Therapy Treatment ?Patient Details ?Name: Tina Mercado ?MRN: [id number] ?DOB: Oct 15, 1929 ?Today's Date: 01/02/2022 ? ? ?History of Present Illness JOYE WESENBERG is a 88 y.o. female who was brought to ED after she had a brief episode of expressive aphasia. PMH: seasonal allergies, unspecified arrhythmia, cataracts, COPD, GERD, large hiatal hernia, glaucoma, impaired hearing, diastolic dysfunction, mitral regurgitation, heart murmur, history of anemia, hyperlipidemia, hypertension, osteoporosis, h/o L hip and pelvic fracture d/t falls over the last few years ? ?  ?PT Comments  ? ? Pt making good progress with mobility. Pt now planning to got to ALF at [facility] for short stay to transition back to her independent apartment. Recommend HHPT at DC.    ?Recommendations for follow up therapy are one component of a multi-disciplinary discharge planning process, led by the attending physician.  Recommendations may be updated based on patient status, additional functional criteria and insurance authorization. ? ?Follow Up Recommendations ? Home health PT (Pt is going to stay at ALF initially) ?  ?  ?Assistance Recommended at Discharge Intermittent Supervision/Assistance  ?Patient can return home with the following A little help with walking and/or transfers;A little help with bathing/dressing/bathroom;Assist for transportation;Help with stairs or ramp for entrance ?  ?Equipment Recommendations ? None recommended by PT  ?  ?Recommendations for Other Services   ? ? ?  ?Precautions / Restrictions Precautions ?Precautions: Fall ?Restrictions ?Weight Bearing Restrictions: No  ?  ? ?Mobility ? Bed Mobility ?Overal bed mobility: Needs Assistance ?Bed Mobility: Supine to Sit ?  ?  ?Supine to sit: Supervision ?  ?  ?General bed mobility comments: supervision for safety ?  ? ?Transfers ?Overall transfer level: Needs assistance ?Equipment used: Rollator (4 wheels) ?Transfers: Sit to/from Stand ?Sit to Stand:  Supervision ?  ?  ?  ?  ?  ?General transfer comment: supervision for safety and incr time ?  ? ?Ambulation/Gait ?Ambulation/Gait assistance: Min guard ?Gait Distance (Feet): 200 Feet ?Assistive device: Rollator (4 wheels) ?Gait Pattern/deviations: Step-through pattern, Decreased stride length, Trunk flexed ?Gait velocity: decr ?Gait velocity interpretation: 1.31 - 2.62 ft/sec, indicative of limited community ambulator ?  ?General Gait Details: Assist for safety ? ? ?Stairs ?  ?  ?  ?  ?  ? ? ?Wheelchair Mobility ?  ? ?Modified Rankin (Stroke Patients Only) ?Modified Rankin (Stroke Patients Only) ?Pre-Morbid Rankin Score: Moderately severe disability ?Modified Rankin: Moderately severe disability ? ? ?  ?Balance Overall balance assessment: Needs assistance ?Sitting-balance support: Feet supported, No upper extremity supported ?Sitting balance-Leahy Scale: Good ?  ?  ?Standing balance support: Bilateral upper extremity supported, During functional activity ?Standing balance-Leahy Scale: Poor ?Standing balance comment: rollator and supervision for static standing ?  ?  ?  ?  ?  ?  ?  ?  ?  ?  ?  ?  ? ?  ?Cognition Arousal/Alertness: Awake/alert ?Behavior During Therapy: [facility] for tasks assessed/performed ?Overall Cognitive Status: Within Functional Limits for tasks assessed ?  ?  ?  ?  ?  ?  ?  ?  ?  ?  ?  ?  ?  ?  ?  ?  ?  ?  ?  ? ?  ?Exercises   ? ?  ?General Comments   ?  ?  ? ?Pertinent Vitals/Pain Pain Assessment ?Pain Assessment: No/denies pain  ? ? ?Home Living   ?  ?  ?  ?  ?  ?  ?  ?  ?  ?   ?  ?  Prior Function    ?  ?  ?   ? ?PT Goals (current goals can now be found in the care plan section) Acute Rehab PT Goals ?Patient Stated Goal: home ?Progress towards PT goals: Progressing toward goals ? ?  ?Frequency ? ? ? Min 4X/week ? ? ? ?  ?PT Plan Current plan remains appropriate  ? ? ?Co-evaluation   ?  ?  ?  ?  ? ?  ?AM-PAC PT "6 Clicks" Mobility   ?Outcome Measure ? Help needed turning from your back to your side  while in a flat bed without using bedrails?: A Little ?Help needed moving from lying on your back to sitting on the side of a flat bed without using bedrails?: A Little ?Help needed moving to and from a bed to a chair (including a wheelchair)?: A Little ?Help needed standing up from a chair using your arms (e.g., wheelchair or bedside chair)?: A Little ?Help needed to walk in hospital room?: A Little ?Help needed climbing 3-5 steps with a railing? : A Little ?6 Click Score: 18 ? ?  ?End of Session   ?Activity Tolerance: Patient tolerated treatment well ?Patient left: in chair;with call bell/phone within reach ?Nurse Communication: Mobility status ?PT Visit Diagnosis: Unsteadiness on feet (R26.81);Muscle weakness (generalized) (M62.81);Difficulty in walking, not elsewhere classified (R26.2) ?  ? ? ?Time: 0349-1791 ?PT Time Calculation (min) (ACUTE ONLY): 15 min ? ?Charges:  $Gait Training: 8-22 mins          ?          ? ?[facility] PT ?Acute Rehabilitation Services ?Pager [phone] ?Office [phone] ? ? ? ?Shary Decamp [facility] ?01/02/2022, 2:52 PM ? ?

## 2022-01-02 NOTE — Progress Notes (Signed)
Attempted to call report 2x to [facility]. Left message. Waiting for a call back. ?

## 2022-01-02 NOTE — Consult Note (Signed)
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 Age:   64 years Exam Location:  [facility] Procedure:      VAS [place] LOWER EXTREMITY VENOUS (DVT) Referring Phys: Cornelius Moras XU --------------------------------------------------------------------------------  Indications: Stroke.  Comparison Study: no prior Performing Technologist: Archie Patten RVS  Examination Guidelines: A complete evaluation includes B-mode imaging, spectral Doppler, color Doppler, and power Doppler as needed of all accessible portions of each vessel. Bilateral testing is considered an integral part of a complete examination. Limited examinations for reoccurring indications may be performed as noted. The reflux portion of the exam is performed with the patient in reverse Trendelenburg.  +---------+---------------+---------+-----------+----------+--------------+  RIGHT     Compressibility Phasicity Spontaneity Properties Thrombus Aging  +---------+---------------+---------+-----------+----------+--------------+  CFV       Full            Yes       Yes                                     +---------+---------------+---------+-----------+----------+--------------+  SFJ       Full                                                             +---------+---------------+---------+-----------+----------+--------------+  FV Prox   Full                                                             +---------+---------------+---------+-----------+----------+--------------+  FV Mid    Full                                                             +---------+---------------+---------+-----------+----------+--------------+  FV Distal Full                                                             +---------+---------------+---------+-----------+----------+--------------+  PFV       Full                                                             +---------+---------------+---------+-----------+----------+--------------+  POP       Full            Yes       Yes                                    +---------+---------------+---------+-----------+----------+--------------+  PTV       Full                                                             +---------+---------------+---------+-----------+----------+--------------+  PERO      Full                                                             +---------+---------------+---------+-----------+----------+--------------+   +---------+---------------+---------+-----------+----------+--------------+  LEFT      Compressibility Phasicity Spontaneity Properties Thrombus Aging  +---------+---------------+---------+-----------+----------+--------------+  CFV       Full            Yes       Yes                                    +---------+---------------+---------+-----------+----------+--------------+  SFJ       Full                                                             +---------+---------------+---------+-----------+----------+--------------+  FV Prox   Full                                                              +---------+---------------+---------+-----------+----------+--------------+  FV Mid    Full                                                             +---------+---------------+---------+-----------+----------+--------------+  FV Distal Full                                                             +---------+---------------+---------+-----------+----------+--------------+  PFV       Full                                                             +---------+---------------+---------+-----------+----------+--------------+  POP       Full            Yes       Yes                                    +---------+---------------+---------+-----------+----------+--------------+  PTV       Full                                                             +---------+---------------+---------+-----------+----------+--------------+  PERO      Full                                                             +---------+---------------+---------+-----------+----------+--------------+     Summary: BILATERAL: - No evidence of deep vein thrombosis seen in the lower extremities, bilaterally. -No evidence of popliteal cyst, bilaterally.   *See table(s) above for measurements and observations. Electronically signed by Monica Martinez MD on 01/01/2022 at 5:13:21 PM.    Final     12-lead ECG NSR at 67 bpm with 1st degree AV block (personally reviewed) All prior EKG's in EPIC reviewed with no documented atrial fibrillation  Telemetry NSR at 69 bpm with rare PVCs (personally reviewed)  Assessment and Plan:  1. Cryptogenic stroke The patient presents with cryptogenic stroke.  The patient does not have a TEE planned for this AM.  I spoke at length with the patient about monitoring for afib with an implantable loop recorder.  Risks, benefits, and alteratives to implantable loop recorder were discussed with the patient today.   At this time, the patient is very clear in their decision to proceed with implantable loop recorder.    Wound care was reviewed with the patient (keep incision clean and dry for 3 days).  Wound check scheduled and entered in AVS. Please call with questions.    Shirley Friar, PA-C 01/02/2022 8:16 AM

## 2022-01-06 DIAGNOSIS — R2681 Unsteadiness on feet: Secondary | ICD-10-CM | POA: Diagnosis not present

## 2022-01-06 DIAGNOSIS — M6281 Muscle weakness (generalized): Secondary | ICD-10-CM | POA: Diagnosis not present

## 2022-01-06 DIAGNOSIS — R29898 Other symptoms and signs involving the musculoskeletal system: Secondary | ICD-10-CM | POA: Diagnosis not present

## 2022-01-06 DIAGNOSIS — J449 Chronic obstructive pulmonary disease, unspecified: Secondary | ICD-10-CM | POA: Diagnosis not present

## 2022-01-06 DIAGNOSIS — K219 Gastro-esophageal reflux disease without esophagitis: Secondary | ICD-10-CM | POA: Diagnosis not present

## 2022-01-06 DIAGNOSIS — R41841 Cognitive communication deficit: Secondary | ICD-10-CM | POA: Diagnosis not present

## 2022-01-06 DIAGNOSIS — I69898 Other sequelae of other cerebrovascular disease: Secondary | ICD-10-CM | POA: Diagnosis not present

## 2022-01-07 DIAGNOSIS — K219 Gastro-esophageal reflux disease without esophagitis: Secondary | ICD-10-CM | POA: Diagnosis not present

## 2022-01-07 DIAGNOSIS — M6281 Muscle weakness (generalized): Secondary | ICD-10-CM | POA: Diagnosis not present

## 2022-01-07 DIAGNOSIS — J449 Chronic obstructive pulmonary disease, unspecified: Secondary | ICD-10-CM | POA: Diagnosis not present

## 2022-01-07 DIAGNOSIS — R41841 Cognitive communication deficit: Secondary | ICD-10-CM | POA: Diagnosis not present

## 2022-01-07 DIAGNOSIS — I69898 Other sequelae of other cerebrovascular disease: Secondary | ICD-10-CM | POA: Diagnosis not present

## 2022-01-07 DIAGNOSIS — R2681 Unsteadiness on feet: Secondary | ICD-10-CM | POA: Diagnosis not present

## 2022-01-08 DIAGNOSIS — R41841 Cognitive communication deficit: Secondary | ICD-10-CM | POA: Diagnosis not present

## 2022-01-08 DIAGNOSIS — R2681 Unsteadiness on feet: Secondary | ICD-10-CM | POA: Diagnosis not present

## 2022-01-08 DIAGNOSIS — M6281 Muscle weakness (generalized): Secondary | ICD-10-CM | POA: Diagnosis not present

## 2022-01-08 DIAGNOSIS — I69898 Other sequelae of other cerebrovascular disease: Secondary | ICD-10-CM | POA: Diagnosis not present

## 2022-01-08 DIAGNOSIS — K219 Gastro-esophageal reflux disease without esophagitis: Secondary | ICD-10-CM | POA: Diagnosis not present

## 2022-01-08 DIAGNOSIS — J449 Chronic obstructive pulmonary disease, unspecified: Secondary | ICD-10-CM | POA: Diagnosis not present

## 2022-01-09 DIAGNOSIS — R2681 Unsteadiness on feet: Secondary | ICD-10-CM | POA: Diagnosis not present

## 2022-01-09 DIAGNOSIS — I69898 Other sequelae of other cerebrovascular disease: Secondary | ICD-10-CM | POA: Diagnosis not present

## 2022-01-09 DIAGNOSIS — M6281 Muscle weakness (generalized): Secondary | ICD-10-CM | POA: Diagnosis not present

## 2022-01-09 DIAGNOSIS — K219 Gastro-esophageal reflux disease without esophagitis: Secondary | ICD-10-CM | POA: Diagnosis not present

## 2022-01-09 DIAGNOSIS — J449 Chronic obstructive pulmonary disease, unspecified: Secondary | ICD-10-CM | POA: Diagnosis not present

## 2022-01-09 DIAGNOSIS — R41841 Cognitive communication deficit: Secondary | ICD-10-CM | POA: Diagnosis not present

## 2022-01-10 DIAGNOSIS — J449 Chronic obstructive pulmonary disease, unspecified: Secondary | ICD-10-CM | POA: Diagnosis not present

## 2022-01-10 DIAGNOSIS — R41841 Cognitive communication deficit: Secondary | ICD-10-CM | POA: Diagnosis not present

## 2022-01-10 DIAGNOSIS — K219 Gastro-esophageal reflux disease without esophagitis: Secondary | ICD-10-CM | POA: Diagnosis not present

## 2022-01-10 DIAGNOSIS — R2681 Unsteadiness on feet: Secondary | ICD-10-CM | POA: Diagnosis not present

## 2022-01-10 DIAGNOSIS — M6281 Muscle weakness (generalized): Secondary | ICD-10-CM | POA: Diagnosis not present

## 2022-01-10 DIAGNOSIS — I69898 Other sequelae of other cerebrovascular disease: Secondary | ICD-10-CM | POA: Diagnosis not present

## 2022-01-13 DIAGNOSIS — K219 Gastro-esophageal reflux disease without esophagitis: Secondary | ICD-10-CM | POA: Diagnosis not present

## 2022-01-13 DIAGNOSIS — J449 Chronic obstructive pulmonary disease, unspecified: Secondary | ICD-10-CM | POA: Diagnosis not present

## 2022-01-13 DIAGNOSIS — M6281 Muscle weakness (generalized): Secondary | ICD-10-CM | POA: Diagnosis not present

## 2022-01-13 DIAGNOSIS — I69898 Other sequelae of other cerebrovascular disease: Secondary | ICD-10-CM | POA: Diagnosis not present

## 2022-01-13 DIAGNOSIS — R2681 Unsteadiness on feet: Secondary | ICD-10-CM | POA: Diagnosis not present

## 2022-01-13 DIAGNOSIS — R41841 Cognitive communication deficit: Secondary | ICD-10-CM | POA: Diagnosis not present

## 2022-01-14 DIAGNOSIS — J449 Chronic obstructive pulmonary disease, unspecified: Secondary | ICD-10-CM | POA: Diagnosis not present

## 2022-01-14 DIAGNOSIS — I69898 Other sequelae of other cerebrovascular disease: Secondary | ICD-10-CM | POA: Diagnosis not present

## 2022-01-14 DIAGNOSIS — R41841 Cognitive communication deficit: Secondary | ICD-10-CM | POA: Diagnosis not present

## 2022-01-14 DIAGNOSIS — R2681 Unsteadiness on feet: Secondary | ICD-10-CM | POA: Diagnosis not present

## 2022-01-14 DIAGNOSIS — M6281 Muscle weakness (generalized): Secondary | ICD-10-CM | POA: Diagnosis not present

## 2022-01-14 DIAGNOSIS — K219 Gastro-esophageal reflux disease without esophagitis: Secondary | ICD-10-CM | POA: Diagnosis not present

## 2022-01-15 ENCOUNTER — Other Ambulatory Visit: Payer: Self-pay

## 2022-01-15 ENCOUNTER — Ambulatory Visit (INDEPENDENT_AMBULATORY_CARE_PROVIDER_SITE_OTHER): Payer: Medicare Other

## 2022-01-15 DIAGNOSIS — G459 Transient cerebral ischemic attack, unspecified: Secondary | ICD-10-CM

## 2022-01-15 LAB — CUP PACEART INCLINIC DEVICE CHECK
Date Time Interrogation Session: 20230322095330
Implantable Pulse Generator Implant Date: 20230309
Pulse Gen Serial Number: 172093

## 2022-01-15 NOTE — Progress Notes (Signed)
ILR wound check in clinic. Steri strips removed. Wound well healed. Home monitor transmitting nightly. No episodes. Questions answered.  

## 2022-01-17 DIAGNOSIS — J449 Chronic obstructive pulmonary disease, unspecified: Secondary | ICD-10-CM | POA: Diagnosis not present

## 2022-01-17 DIAGNOSIS — K219 Gastro-esophageal reflux disease without esophagitis: Secondary | ICD-10-CM | POA: Diagnosis not present

## 2022-01-17 DIAGNOSIS — I69898 Other sequelae of other cerebrovascular disease: Secondary | ICD-10-CM | POA: Diagnosis not present

## 2022-01-17 DIAGNOSIS — M6281 Muscle weakness (generalized): Secondary | ICD-10-CM | POA: Diagnosis not present

## 2022-01-17 DIAGNOSIS — R2681 Unsteadiness on feet: Secondary | ICD-10-CM | POA: Diagnosis not present

## 2022-01-17 DIAGNOSIS — R41841 Cognitive communication deficit: Secondary | ICD-10-CM | POA: Diagnosis not present

## 2022-01-20 DIAGNOSIS — M6281 Muscle weakness (generalized): Secondary | ICD-10-CM | POA: Diagnosis not present

## 2022-01-20 DIAGNOSIS — J449 Chronic obstructive pulmonary disease, unspecified: Secondary | ICD-10-CM | POA: Diagnosis not present

## 2022-01-20 DIAGNOSIS — R41841 Cognitive communication deficit: Secondary | ICD-10-CM | POA: Diagnosis not present

## 2022-01-20 DIAGNOSIS — R2681 Unsteadiness on feet: Secondary | ICD-10-CM | POA: Diagnosis not present

## 2022-01-20 DIAGNOSIS — K219 Gastro-esophageal reflux disease without esophagitis: Secondary | ICD-10-CM | POA: Diagnosis not present

## 2022-01-20 DIAGNOSIS — I69898 Other sequelae of other cerebrovascular disease: Secondary | ICD-10-CM | POA: Diagnosis not present

## 2022-01-22 DIAGNOSIS — I69898 Other sequelae of other cerebrovascular disease: Secondary | ICD-10-CM | POA: Diagnosis not present

## 2022-01-22 DIAGNOSIS — M6281 Muscle weakness (generalized): Secondary | ICD-10-CM | POA: Diagnosis not present

## 2022-01-22 DIAGNOSIS — K219 Gastro-esophageal reflux disease without esophagitis: Secondary | ICD-10-CM | POA: Diagnosis not present

## 2022-01-22 DIAGNOSIS — R2681 Unsteadiness on feet: Secondary | ICD-10-CM | POA: Diagnosis not present

## 2022-01-22 DIAGNOSIS — R41841 Cognitive communication deficit: Secondary | ICD-10-CM | POA: Diagnosis not present

## 2022-01-22 DIAGNOSIS — J449 Chronic obstructive pulmonary disease, unspecified: Secondary | ICD-10-CM | POA: Diagnosis not present

## 2022-01-24 DIAGNOSIS — I69898 Other sequelae of other cerebrovascular disease: Secondary | ICD-10-CM | POA: Diagnosis not present

## 2022-01-24 DIAGNOSIS — J449 Chronic obstructive pulmonary disease, unspecified: Secondary | ICD-10-CM | POA: Diagnosis not present

## 2022-01-24 DIAGNOSIS — R41841 Cognitive communication deficit: Secondary | ICD-10-CM | POA: Diagnosis not present

## 2022-01-24 DIAGNOSIS — K219 Gastro-esophageal reflux disease without esophagitis: Secondary | ICD-10-CM | POA: Diagnosis not present

## 2022-01-24 DIAGNOSIS — R2681 Unsteadiness on feet: Secondary | ICD-10-CM | POA: Diagnosis not present

## 2022-01-24 DIAGNOSIS — M6281 Muscle weakness (generalized): Secondary | ICD-10-CM | POA: Diagnosis not present

## 2022-01-27 DIAGNOSIS — R41841 Cognitive communication deficit: Secondary | ICD-10-CM | POA: Diagnosis not present

## 2022-01-27 DIAGNOSIS — K219 Gastro-esophageal reflux disease without esophagitis: Secondary | ICD-10-CM | POA: Diagnosis not present

## 2022-01-27 DIAGNOSIS — M6281 Muscle weakness (generalized): Secondary | ICD-10-CM | POA: Diagnosis not present

## 2022-01-27 DIAGNOSIS — R2681 Unsteadiness on feet: Secondary | ICD-10-CM | POA: Diagnosis not present

## 2022-01-27 DIAGNOSIS — I69898 Other sequelae of other cerebrovascular disease: Secondary | ICD-10-CM | POA: Diagnosis not present

## 2022-01-27 DIAGNOSIS — R29898 Other symptoms and signs involving the musculoskeletal system: Secondary | ICD-10-CM | POA: Diagnosis not present

## 2022-01-27 DIAGNOSIS — J449 Chronic obstructive pulmonary disease, unspecified: Secondary | ICD-10-CM | POA: Diagnosis not present

## 2022-01-28 DIAGNOSIS — R2681 Unsteadiness on feet: Secondary | ICD-10-CM | POA: Diagnosis not present

## 2022-01-28 DIAGNOSIS — M6281 Muscle weakness (generalized): Secondary | ICD-10-CM | POA: Diagnosis not present

## 2022-01-28 DIAGNOSIS — R41841 Cognitive communication deficit: Secondary | ICD-10-CM | POA: Diagnosis not present

## 2022-01-28 DIAGNOSIS — I69898 Other sequelae of other cerebrovascular disease: Secondary | ICD-10-CM | POA: Diagnosis not present

## 2022-01-28 DIAGNOSIS — K219 Gastro-esophageal reflux disease without esophagitis: Secondary | ICD-10-CM | POA: Diagnosis not present

## 2022-01-28 DIAGNOSIS — J449 Chronic obstructive pulmonary disease, unspecified: Secondary | ICD-10-CM | POA: Diagnosis not present

## 2022-01-29 DIAGNOSIS — R41841 Cognitive communication deficit: Secondary | ICD-10-CM | POA: Diagnosis not present

## 2022-01-29 DIAGNOSIS — R2681 Unsteadiness on feet: Secondary | ICD-10-CM | POA: Diagnosis not present

## 2022-01-29 DIAGNOSIS — I69898 Other sequelae of other cerebrovascular disease: Secondary | ICD-10-CM | POA: Diagnosis not present

## 2022-01-29 DIAGNOSIS — J449 Chronic obstructive pulmonary disease, unspecified: Secondary | ICD-10-CM | POA: Diagnosis not present

## 2022-01-29 DIAGNOSIS — K219 Gastro-esophageal reflux disease without esophagitis: Secondary | ICD-10-CM | POA: Diagnosis not present

## 2022-01-29 DIAGNOSIS — M6281 Muscle weakness (generalized): Secondary | ICD-10-CM | POA: Diagnosis not present

## 2022-01-30 ENCOUNTER — Encounter: Payer: Self-pay | Admitting: Nurse Practitioner

## 2022-01-30 ENCOUNTER — Non-Acute Institutional Stay: Payer: Medicare Other | Admitting: Nurse Practitioner

## 2022-01-30 DIAGNOSIS — D649 Anemia, unspecified: Secondary | ICD-10-CM | POA: Diagnosis not present

## 2022-01-30 DIAGNOSIS — G459 Transient cerebral ischemic attack, unspecified: Secondary | ICD-10-CM | POA: Diagnosis not present

## 2022-01-30 DIAGNOSIS — G629 Polyneuropathy, unspecified: Secondary | ICD-10-CM | POA: Diagnosis not present

## 2022-01-30 DIAGNOSIS — J449 Chronic obstructive pulmonary disease, unspecified: Secondary | ICD-10-CM

## 2022-01-30 DIAGNOSIS — I10 Essential (primary) hypertension: Secondary | ICD-10-CM | POA: Diagnosis not present

## 2022-01-30 DIAGNOSIS — R609 Edema, unspecified: Secondary | ICD-10-CM | POA: Diagnosis not present

## 2022-01-30 DIAGNOSIS — Z95818 Presence of other cardiac implants and grafts: Secondary | ICD-10-CM | POA: Diagnosis not present

## 2022-01-30 DIAGNOSIS — K219 Gastro-esophageal reflux disease without esophagitis: Secondary | ICD-10-CM | POA: Diagnosis not present

## 2022-01-30 MED ORDER — FAMOTIDINE 20 MG PO TABS: 20.0000 mg | ORAL_TABLET | Freq: Every day | ORAL | 3 refills | Status: DC

## 2022-01-30 NOTE — Assessment & Plan Note (Signed)
numbness, burning sensation BLE, takes Amitriptyline ?

## 2022-01-30 NOTE — Assessment & Plan Note (Signed)
takes Advair, chronic hacking cough ?
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
## 2022-01-30 NOTE — Assessment & Plan Note (Signed)
Loop recorder insertion, 12/2021, f/u Cardiology.  ?

## 2022-01-30 NOTE — Assessment & Plan Note (Signed)
Takes Plavix, Statin ?

## 2022-01-30 NOTE — Assessment & Plan Note (Signed)
off Amlodipine, persisted mild edema BLE.  ?

## 2022-01-30 NOTE — Assessment & Plan Note (Addendum)
Self dc'd Esomeprazole. Wants to try Famotidine.  ?

## 2022-01-30 NOTE — Assessment & Plan Note (Signed)
Hgb 13.3 12/31/21 ?

## 2022-01-30 NOTE — Assessment & Plan Note (Signed)
too soon to eval? BNP 88 05/22/20, Bun/creat 13/1.08 01/01/22 ?

## 2022-01-31 DIAGNOSIS — R2681 Unsteadiness on feet: Secondary | ICD-10-CM | POA: Diagnosis not present

## 2022-01-31 DIAGNOSIS — K219 Gastro-esophageal reflux disease without esophagitis: Secondary | ICD-10-CM | POA: Diagnosis not present

## 2022-01-31 DIAGNOSIS — M6281 Muscle weakness (generalized): Secondary | ICD-10-CM | POA: Diagnosis not present

## 2022-01-31 DIAGNOSIS — I69898 Other sequelae of other cerebrovascular disease: Secondary | ICD-10-CM | POA: Diagnosis not present

## 2022-01-31 DIAGNOSIS — R41841 Cognitive communication deficit: Secondary | ICD-10-CM | POA: Diagnosis not present

## 2022-01-31 DIAGNOSIS — J449 Chronic obstructive pulmonary disease, unspecified: Secondary | ICD-10-CM | POA: Diagnosis not present

## 2022-02-03 ENCOUNTER — Ambulatory Visit (INDEPENDENT_AMBULATORY_CARE_PROVIDER_SITE_OTHER): Payer: Medicare Other

## 2022-02-03 ENCOUNTER — Encounter: Payer: Self-pay | Admitting: Nurse Practitioner

## 2022-02-03 DIAGNOSIS — G459 Transient cerebral ischemic attack, unspecified: Secondary | ICD-10-CM

## 2022-02-04 DIAGNOSIS — R2681 Unsteadiness on feet: Secondary | ICD-10-CM | POA: Diagnosis not present

## 2022-02-04 DIAGNOSIS — I69898 Other sequelae of other cerebrovascular disease: Secondary | ICD-10-CM | POA: Diagnosis not present

## 2022-02-04 DIAGNOSIS — M6281 Muscle weakness (generalized): Secondary | ICD-10-CM | POA: Diagnosis not present

## 2022-02-04 DIAGNOSIS — K219 Gastro-esophageal reflux disease without esophagitis: Secondary | ICD-10-CM | POA: Diagnosis not present

## 2022-02-04 DIAGNOSIS — J449 Chronic obstructive pulmonary disease, unspecified: Secondary | ICD-10-CM | POA: Diagnosis not present

## 2022-02-04 DIAGNOSIS — R41841 Cognitive communication deficit: Secondary | ICD-10-CM | POA: Diagnosis not present

## 2022-02-04 LAB — CUP PACEART REMOTE DEVICE CHECK
Date Time Interrogation Session: 20230410081608
Implantable Pulse Generator Implant Date: 20230309
Pulse Gen Serial Number: 172093

## 2022-02-05 DIAGNOSIS — H353132 Nonexudative age-related macular degeneration, bilateral, intermediate dry stage: Secondary | ICD-10-CM | POA: Diagnosis not present

## 2022-02-05 DIAGNOSIS — H18512 Endothelial corneal dystrophy, left eye: Secondary | ICD-10-CM | POA: Diagnosis not present

## 2022-02-05 DIAGNOSIS — Z961 Presence of intraocular lens: Secondary | ICD-10-CM | POA: Diagnosis not present

## 2022-02-05 DIAGNOSIS — H2512 Age-related nuclear cataract, left eye: Secondary | ICD-10-CM | POA: Diagnosis not present

## 2022-02-05 DIAGNOSIS — Z947 Corneal transplant status: Secondary | ICD-10-CM | POA: Diagnosis not present

## 2022-02-05 DIAGNOSIS — H02401 Unspecified ptosis of right eyelid: Secondary | ICD-10-CM | POA: Diagnosis not present

## 2022-02-06 DIAGNOSIS — J449 Chronic obstructive pulmonary disease, unspecified: Secondary | ICD-10-CM | POA: Diagnosis not present

## 2022-02-06 DIAGNOSIS — R2681 Unsteadiness on feet: Secondary | ICD-10-CM | POA: Diagnosis not present

## 2022-02-06 DIAGNOSIS — K219 Gastro-esophageal reflux disease without esophagitis: Secondary | ICD-10-CM | POA: Diagnosis not present

## 2022-02-06 DIAGNOSIS — I69898 Other sequelae of other cerebrovascular disease: Secondary | ICD-10-CM | POA: Diagnosis not present

## 2022-02-06 DIAGNOSIS — M6281 Muscle weakness (generalized): Secondary | ICD-10-CM | POA: Diagnosis not present

## 2022-02-06 DIAGNOSIS — R41841 Cognitive communication deficit: Secondary | ICD-10-CM | POA: Diagnosis not present

## 2022-02-07 DIAGNOSIS — I69898 Other sequelae of other cerebrovascular disease: Secondary | ICD-10-CM | POA: Diagnosis not present

## 2022-02-07 DIAGNOSIS — R2681 Unsteadiness on feet: Secondary | ICD-10-CM | POA: Diagnosis not present

## 2022-02-07 DIAGNOSIS — M6281 Muscle weakness (generalized): Secondary | ICD-10-CM | POA: Diagnosis not present

## 2022-02-07 DIAGNOSIS — J449 Chronic obstructive pulmonary disease, unspecified: Secondary | ICD-10-CM | POA: Diagnosis not present

## 2022-02-07 DIAGNOSIS — R41841 Cognitive communication deficit: Secondary | ICD-10-CM | POA: Diagnosis not present

## 2022-02-07 DIAGNOSIS — K219 Gastro-esophageal reflux disease without esophagitis: Secondary | ICD-10-CM | POA: Diagnosis not present

## 2022-02-10 ENCOUNTER — Ambulatory Visit (INDEPENDENT_AMBULATORY_CARE_PROVIDER_SITE_OTHER): Payer: Medicare Other | Admitting: Neurology

## 2022-02-10 ENCOUNTER — Encounter: Payer: Self-pay | Admitting: Neurology

## 2022-02-10 VITALS — BP 160/62 | HR 66 | Ht 62.0 in | Wt 126.2 lb

## 2022-02-10 DIAGNOSIS — Z8673 Personal history of transient ischemic attack (TIA), and cerebral infarction without residual deficits: Secondary | ICD-10-CM
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
## 2022-02-10 NOTE — Patient Instructions (Addendum)
It was nice to see you today. I am glad you are feeling stable.  ?As discussed, secondary prevention is key after a stroke. This means: taking care of blood sugar values and optimize diabetes management or prediabetes control, (A1c goal of less than 7.0), good blood pressure (hypertension) control and optimizing cholesterol management (with LDL goal of less than 70), exercising daily or regularly within your own mobility limitations of course, as well as obesity management screening for and treatment of obstructive sleep apnea (OSA), if suspected. ?Extensive work-up in the hospital, we do not need to add any new test at this time, continue with Plavix as instructed.  Please continue with your cholesterol medicine and follow-up with your primary care on a regular basis. ?You can follow-up in this clinic on an as-needed basis.  ?

## 2022-02-11 DIAGNOSIS — M6281 Muscle weakness (generalized): Secondary | ICD-10-CM | POA: Diagnosis not present

## 2022-02-11 DIAGNOSIS — I69898 Other sequelae of other cerebrovascular disease: Secondary | ICD-10-CM | POA: Diagnosis not present

## 2022-02-11 DIAGNOSIS — R41841 Cognitive communication deficit: Secondary | ICD-10-CM | POA: Diagnosis not present

## 2022-02-11 DIAGNOSIS — K219 Gastro-esophageal reflux disease without esophagitis: Secondary | ICD-10-CM | POA: Diagnosis not present

## 2022-02-11 DIAGNOSIS — R2681 Unsteadiness on feet: Secondary | ICD-10-CM | POA: Diagnosis not present

## 2022-02-11 DIAGNOSIS — J449 Chronic obstructive pulmonary disease, unspecified: Secondary | ICD-10-CM | POA: Diagnosis not present

## 2022-02-12 DIAGNOSIS — J449 Chronic obstructive pulmonary disease, unspecified: Secondary | ICD-10-CM | POA: Diagnosis not present

## 2022-02-12 DIAGNOSIS — K219 Gastro-esophageal reflux disease without esophagitis: Secondary | ICD-10-CM | POA: Diagnosis not present

## 2022-02-12 DIAGNOSIS — R2681 Unsteadiness on feet: Secondary | ICD-10-CM | POA: Diagnosis not present

## 2022-02-12 DIAGNOSIS — I69898 Other sequelae of other cerebrovascular disease: Secondary | ICD-10-CM | POA: Diagnosis not present

## 2022-02-12 DIAGNOSIS — M6281 Muscle weakness (generalized): Secondary | ICD-10-CM | POA: Diagnosis not present

## 2022-02-12 DIAGNOSIS — R41841 Cognitive communication deficit: Secondary | ICD-10-CM | POA: Diagnosis not present

## 2022-02-14 DIAGNOSIS — J449 Chronic obstructive pulmonary disease, unspecified: Secondary | ICD-10-CM | POA: Diagnosis not present

## 2022-02-14 DIAGNOSIS — M6281 Muscle weakness (generalized): Secondary | ICD-10-CM | POA: Diagnosis not present

## 2022-02-14 DIAGNOSIS — I69898 Other sequelae of other cerebrovascular disease: Secondary | ICD-10-CM | POA: Diagnosis not present

## 2022-02-14 DIAGNOSIS — R2681 Unsteadiness on feet: Secondary | ICD-10-CM | POA: Diagnosis not present

## 2022-02-14 DIAGNOSIS — R41841 Cognitive communication deficit: Secondary | ICD-10-CM | POA: Diagnosis not present

## 2022-02-14 DIAGNOSIS — K219 Gastro-esophageal reflux disease without esophagitis: Secondary | ICD-10-CM | POA: Diagnosis not present

## 2022-02-17 DIAGNOSIS — R2681 Unsteadiness on feet: Secondary | ICD-10-CM | POA: Diagnosis not present

## 2022-02-17 DIAGNOSIS — R41841 Cognitive communication deficit: Secondary | ICD-10-CM | POA: Diagnosis not present

## 2022-02-17 DIAGNOSIS — K219 Gastro-esophageal reflux disease without esophagitis: Secondary | ICD-10-CM | POA: Diagnosis not present

## 2022-02-17 DIAGNOSIS — M6281 Muscle weakness (generalized): Secondary | ICD-10-CM | POA: Diagnosis not present

## 2022-02-17 DIAGNOSIS — J449 Chronic obstructive pulmonary disease, unspecified: Secondary | ICD-10-CM | POA: Diagnosis not present

## 2022-02-17 DIAGNOSIS — I69898 Other sequelae of other cerebrovascular disease: Secondary | ICD-10-CM | POA: Diagnosis not present

## 2022-02-18 NOTE — Progress Notes (Signed)
?Cardiology Office Note:   ? ?Date:  02/28/2022  ? ?ID:  Tina Mercado, DOB November 20, 1928, MRN [id number] ? ?PCP:  System, Provider Not In ?  ?[facility] Providers ?Cardiologist:  Kirk Ruths, MD ?Cardiology APP:  Ledora Bottcher, [place] { ?Referring MD: No ref. provider found  ? ?Chief Complaint  ?Patient presents with  ? Hospitalization Follow-up  ?  CVA  ? ? ?History of Present Illness:   ? ?Tina Mercado is a 88 y.o. female with a hx of TIA, MR, HTN, COPD, GERD, and HLD. Echo April 2021 with normal LVEF and mild MR. She was last seen by Dr. Stanford Breed in clinic 2021 and was doing well.  ? ?She was recently hospitalized with posterior right parietal infarct. MRI showed subacute to chronic infarct in right cerebellar white matter. She reported expressive aphasia 1 month ago that lasted several weeks. Prior to this admission, she again had expressive aphasia lasting 2 hrs prompting ER evaluation.  ILR implanted 01/02/22 prior to discharge. No arrhythmias detected so far. She was discharged on DAPT then plavix monotherapy. HLD treated by increasing crestor to 40 mg. Echo with LVEF 60-65%, no MR mentioned. ? ?She presents today for routine follow up. She actually feels better after the stroke - feels better than she has in months, less weakness in her legs. She really has no cardiac complaints today. She has a lot of questions about the ILR.  ? ? ?Past Medical History:  ?Diagnosis Date  ? Allergy   ? Arrhythmia   ? Cataract   ? COPD (chronic obstructive pulmonary disease) ([place])   ? GERD (gastroesophageal reflux disease)   ? Glaucoma   ? Hearing loss   ? does not wear hearing aids  ? Heart murmur   ? History of anemia   ? History of diastolic dysfunction   ? History of seasonal allergies   ? Hyperlipidemia   ? Hypertension   ? Large hiatal hernia   ? see on thoracic spine xray  ? Mitral regurgitation   ? mild to moderate  ? Osteoporosis   ? Wears glasses   ? readers  ? ? ?Past Surgical History:  ?Procedure  Laterality Date  ? APPENDECTOMY    ? BREAST SURGERY    ? childbirth    ? x 1  ? CORNEAL TRANSPLANT  july 2007  ? EYE SURGERY    ? INTRAMEDULLARY (IM) NAIL INTERTROCHANTERIC Left 03/03/2019  ? Procedure: INTRAMEDULLARY (IM) NAIL INTERTROCHANTRIC;  Surgeon: Shona Needles, MD;  Location: [facility];  Service: Orthopedics;  Laterality: Left;  ? LOOP RECORDER INSERTION N/A 01/02/2022  ? Procedure: LOOP RECORDER INSERTION;  Surgeon: Vickie Epley, MD;  Location: [facility];  Service: Cardiovascular;  Laterality: N/A;  ? TONSILLECTOMY AND ADENOIDECTOMY    ? age 39yr  ? ? ?Current Medications: ?Current Meds  ?Medication Sig  ? acetaminophen (TYLENOL) 325 MG tablet Take 2 tablets (650 mg total) by mouth every 6 (six) hours as needed for mild pain, moderate pain or fever.  ? ADVAIR HFA 115-21 MCG/ACT inhaler TAKE 2 PUFFS BY MOUTH TWICE A DAY  ? amitriptyline (ELAVIL) 10 MG tablet Take 1 tablet (10 mg total) by mouth at bedtime. G62.9  ? Cholecalciferol (VITAMIN D) 50 MCG (2000 UT) CAPS Take 2,000 Units by mouth at bedtime.  ? clopidogrel (PLAVIX) 75 MG tablet Take 1 tablet (75 mg total) by mouth daily.  ? docusate sodium (COLACE) 100 MG capsule Take 200 mg by  mouth at bedtime.  ? famotidine (PEPCID) 20 MG tablet Take 1 tablet (20 mg total) by mouth daily.  ? ketorolac (ACULAR) 0.5 % ophthalmic solution Place 1 drop into the right eye 2 (two) times daily.  ? rosuvastatin (CRESTOR) 40 MG tablet Take 1 tablet (40 mg total) by mouth daily.  ? timolol (TIMOPTIC) 0.5 % ophthalmic solution INSTILL 1 DROP INTO BOTH EYES TWICE A DAY  ? triamcinolone 0.1% oint-Eucerin equivalent cream 1:1 mixture Apply topically 2 (two) times daily as needed.  ?  ? ?Allergies:   Contrast media [iodinated contrast media]; Antihistamines, diphenhydramine-type; Celebrex [celecoxib]; Darvocet [propoxyphene n-acetaminophen]; Demerol; Diphenhydramine hcl; Feldene [piroxicam]; Gabapentin; Meperidine; Norvasc [amlodipine besylate]; Propoxyphene; and  Iodine-131  ? ?Social History  ? ?Socioeconomic History  ? Marital status: Single  ?  Spouse name: Not on file  ? Number of children: Not on file  ? Years of education: Not on file  ? Highest education level: Not on file  ?Occupational History  ? Not on file  ?Tobacco Use  ? Smoking status: Never  ? Smokeless tobacco: Never  ?Vaping Use  ? Vaping Use: Never used  ?Substance and Sexual Activity  ? Alcohol use: No  ?  Alcohol/week: 0.0 standard drinks  ? Drug use: No  ? Sexual activity: Never  ?  Birth control/protection: Abstinence, Post-menopausal  ?Other Topics Concern  ? Not on file  ?Social History Narrative  ? Not on file  ? ?Social Determinants of Health  ? ?Financial Resource Strain: Not on file  ?Food Insecurity: Not on file  ?Transportation Needs: Not on file  ?Physical Activity: Not on file  ?Stress: Not on file  ?Social Connections: Not on file  ?  ? ?Family History: ?The patient's family history includes COPD in her brother; Emphysema in her brother and father; Heart disease in her mother. ? ?ROS:   ?Please see the history of present illness.    ? All other systems reviewed and are negative. ? ?EKGs/Labs/Other Studies Reviewed:   ? ?The following studies were reviewed today: ? ?Echo 01/01/22: ?1. Left ventricular ejection fraction, by estimation, is 60 to 65%. The  ?left ventricle has normal function. The left ventricle has no regional  ?wall motion abnormalities. Left ventricular diastolic parameters are  ?indeterminate.  ? 2. Right ventricular systolic function is normal. The right ventricular  ?size is normal.  ? 3. Left atrial size was mildly dilated.  ? 4. The mitral valve is grossly normal. Trivial mitral valve  ?regurgitation. No evidence of mitral stenosis.  ? 5. The aortic valve was not well visualized. Aortic valve regurgitation  ?is not visualized.  ? ?EKG:  EKG is  ordered today. ? ?Recent Labs: ?12/31/2021: ALT 10; Hemoglobin 14.6; Platelets 204 ?01/01/2022: BUN 13; Creatinine, Ser 1.08; Potassium  4.1; Sodium 138  ?Recent Lipid Panel ?   ?Component Value Date/Time  ? CHOL 321 (H) 01/01/2022 0141  ? CHOL 179 02/09/2020 1048  ? TRIG 215 (H) 01/01/2022 0141  ? HDL 40 (L) 01/01/2022 0141  ? HDL 53 02/09/2020 1048  ? CHOLHDL 8.0 01/01/2022 0141  ? VLDL 43 (H) 01/01/2022 0141  ? Springer 238 (H) 01/01/2022 0141  ? LDLCALC 101 (H) 02/09/2020 1048  ? ? ? ?Risk Assessment/Calculations:   ?  ? ?    ? ?Physical Exam:   ? ?VS:  BP 118/80 (BP Location: Left Arm, Patient Position: Sitting, Cuff Size: Normal)   Resp 20   Ht '5\' 2"'$  (1.575 m)   Wt  128 lb 9.6 oz (58.3 kg)   LMP  (LMP Unknown)   SpO2 98%   BMI 23.52 kg/m?    ? ?Wt Readings from Last 3 Encounters:  ?02/28/22 128 lb 9.6 oz (58.3 kg)  ?02/10/22 126 lb 3.2 oz (57.2 kg)  ?01/30/22 130 lb (59 kg)  ?  ? ?GEN: elderly female in NAD ?HEENT: Normal ?NECK: No JVD; No carotid bruits ?LYMPHATICS: No lymphadenopathy ?CARDIAC: RRR, no murmurs, rubs, gallops ?RESPIRATORY:  Clear to auscultation without rales, wheezing or rhonchi  ?ABDOMEN: Soft, non-tender, non-distended ?MUSCULOSKELETAL:  No edema; No deformity  ?SKIN: Warm and dry ?NEUROLOGIC:  Alert and oriented x 3 ?PSYCHIATRIC:  Normal affect  ? ?ASSESSMENT:   ? ?1. Itching   ?2. TIA (transient ischemic attack)   ?3. Pure hypercholesterolemia   ? ?PLAN:   ? ?In order of problems listed above: ? ?Hyperlipidemia with LDL goal < 70 ?01/01/2022: Cholesterol 321; HDL 40; LDL Cholesterol 238; Triglycerides 215; VLDL 43 ?Now on 40 mg crestor ?- repeat labs today (nonfasting) ? ? ?Mild MR ?No SOB, no syncope. ? ? ?Recent CVA 12/2021 ?ASA + plavix x 3 weeks, then plavix monotherapy ?- suspect this was a second occurrence based on pt history ?- ILR in place - no Afib so far ? ? ?Hypertension ?- on no medications ?- BP well controlled without agents ?- no headache/blurry vision ? ? ?Post-COVID itching ?- failed hydrocortisone ?- will give one tube of 0.1% triamcinolone ? ? ? ?Follow up with Dr. Stanford Breed or myself in 4-5 months.   ? ?   ? ?Medication Adjustments/Labs and Tests Ordered: ?Current medicines are reviewed at length with the patient today.  Concerns regarding medicines are outlined above.  ?Orders Placed This Encounter  ?Procedures

## 2022-02-18 NOTE — Progress Notes (Signed)
Carelink Summary Report / Loop Recorder 

## 2022-02-19 DIAGNOSIS — I69898 Other sequelae of other cerebrovascular disease: Secondary | ICD-10-CM | POA: Diagnosis not present

## 2022-02-19 DIAGNOSIS — K219 Gastro-esophageal reflux disease without esophagitis: Secondary | ICD-10-CM | POA: Diagnosis not present

## 2022-02-19 DIAGNOSIS — R2681 Unsteadiness on feet: Secondary | ICD-10-CM | POA: Diagnosis not present

## 2022-02-19 DIAGNOSIS — R41841 Cognitive communication deficit: Secondary | ICD-10-CM | POA: Diagnosis not present

## 2022-02-19 DIAGNOSIS — J449 Chronic obstructive pulmonary disease, unspecified: Secondary | ICD-10-CM | POA: Diagnosis not present

## 2022-02-19 DIAGNOSIS — M6281 Muscle weakness (generalized): Secondary | ICD-10-CM | POA: Diagnosis not present

## 2022-02-21 DIAGNOSIS — K219 Gastro-esophageal reflux disease without esophagitis: Secondary | ICD-10-CM | POA: Diagnosis not present

## 2022-02-21 DIAGNOSIS — J449 Chronic obstructive pulmonary disease, unspecified: Secondary | ICD-10-CM | POA: Diagnosis not present

## 2022-02-21 DIAGNOSIS — I69898 Other sequelae of other cerebrovascular disease: Secondary | ICD-10-CM | POA: Diagnosis not present

## 2022-02-21 DIAGNOSIS — M6281 Muscle weakness (generalized): Secondary | ICD-10-CM | POA: Diagnosis not present

## 2022-02-21 DIAGNOSIS — R2681 Unsteadiness on feet: Secondary | ICD-10-CM | POA: Diagnosis not present

## 2022-02-21 DIAGNOSIS — R41841 Cognitive communication deficit: Secondary | ICD-10-CM | POA: Diagnosis not present

## 2022-02-24 DIAGNOSIS — M6281 Muscle weakness (generalized): Secondary | ICD-10-CM | POA: Diagnosis not present

## 2022-02-24 DIAGNOSIS — R2681 Unsteadiness on feet: Secondary | ICD-10-CM | POA: Diagnosis not present

## 2022-02-24 DIAGNOSIS — R29898 Other symptoms and signs involving the musculoskeletal system: Secondary | ICD-10-CM | POA: Diagnosis not present

## 2022-02-24 DIAGNOSIS — J449 Chronic obstructive pulmonary disease, unspecified: Secondary | ICD-10-CM | POA: Diagnosis not present

## 2022-02-24 DIAGNOSIS — I69898 Other sequelae of other cerebrovascular disease: Secondary | ICD-10-CM | POA: Diagnosis not present

## 2022-02-26 DIAGNOSIS — R2681 Unsteadiness on feet: Secondary | ICD-10-CM | POA: Diagnosis not present

## 2022-02-26 DIAGNOSIS — J449 Chronic obstructive pulmonary disease, unspecified: Secondary | ICD-10-CM | POA: Diagnosis not present

## 2022-02-26 DIAGNOSIS — M6281 Muscle weakness (generalized): Secondary | ICD-10-CM | POA: Diagnosis not present

## 2022-02-26 DIAGNOSIS — I69898 Other sequelae of other cerebrovascular disease: Secondary | ICD-10-CM | POA: Diagnosis not present

## 2022-02-26 DIAGNOSIS — R29898 Other symptoms and signs involving the musculoskeletal system: Secondary | ICD-10-CM | POA: Diagnosis not present

## 2022-02-27 DIAGNOSIS — J449 Chronic obstructive pulmonary disease, unspecified: Secondary | ICD-10-CM | POA: Diagnosis not present

## 2022-02-27 DIAGNOSIS — M6281 Muscle weakness (generalized): Secondary | ICD-10-CM | POA: Diagnosis not present

## 2022-02-27 DIAGNOSIS — R2681 Unsteadiness on feet: Secondary | ICD-10-CM | POA: Diagnosis not present

## 2022-02-27 DIAGNOSIS — R29898 Other symptoms and signs involving the musculoskeletal system: Secondary | ICD-10-CM | POA: Diagnosis not present

## 2022-02-27 DIAGNOSIS — I69898 Other sequelae of other cerebrovascular disease: Secondary | ICD-10-CM | POA: Diagnosis not present

## 2022-02-28 ENCOUNTER — Ambulatory Visit: Payer: Medicare Other | Admitting: Nurse Practitioner

## 2022-02-28 ENCOUNTER — Encounter: Payer: Self-pay | Admitting: Physician Assistant

## 2022-02-28 ENCOUNTER — Ambulatory Visit (INDEPENDENT_AMBULATORY_CARE_PROVIDER_SITE_OTHER): Payer: Medicare Other | Admitting: Physician Assistant

## 2022-02-28 VITALS — BP 118/80 | Resp 20 | Ht 62.0 in | Wt 128.6 lb

## 2022-02-28 DIAGNOSIS — E7800 Pure hypercholesterolemia, unspecified: Secondary | ICD-10-CM

## 2022-02-28 DIAGNOSIS — G459 Transient cerebral ischemic attack, unspecified: Secondary | ICD-10-CM | POA: Diagnosis not present

## 2022-02-28 DIAGNOSIS — L299 Pruritus, unspecified: Secondary | ICD-10-CM | POA: Diagnosis not present

## 2022-02-28 MED ORDER — TRIAMCINOLONE ACETONIDE 0.1 % EX OINT: TOPICAL_OINTMENT | Freq: Two times a day (BID) | CUTANEOUS | 1 refills | Status: DC | PRN

## 2022-02-28 NOTE — Patient Instructions (Signed)
Medication Instructions:  ?Triamcinolone .1% cream twice daily for itching. Please do not use more than 5 days in a row.  ?*If you need a refill on your cardiac medications before your next appointment, please call your pharmacy* ? ? ?Lab Work: ?Your physician recommends that you complete labs today ?Lipid panel ?Direct LDL ?LFTs ? ? ?If you have labs (blood work) drawn today and your tests are completely normal, you will receive your results only by: ?MyChart Message (if you have MyChart) OR ?A paper copy in the mail ?If you have any lab test that is abnormal or we need to change your treatment, we will call you to review the results. ? ? ?Testing/Procedures: ?NONE ordered at this time of appointment   ? ? ?Follow-Up: ?At [facility], you and your health needs are our priority.  As part of our continuing mission to provide you with exceptional heart care, we have created designated Provider Care Teams.  These Care Teams include your primary Cardiologist (physician) and Advanced Practice Providers (APPs -  Physician Assistants and Nurse Practitioners) who all work together to provide you with the care you need, when you need it. ? ?We recommend signing up for the patient portal called "MyChart".  Sign up information is provided on this After Visit Summary.  MyChart is used to connect with patients for Virtual Visits (Telemedicine).  Patients are able to view lab/test results, encounter notes, upcoming appointments, etc.  Non-urgent messages can be sent to your provider as well.   ?To learn more about what you can do with MyChart, go to [email or web address].   ? ?Your next appointment:   ?4-5 month(s) ? ?The format for your next appointment:   ?In Person ? ?Provider:   ?Kirk Ruths, MD  or Fabian Sharp, PA-C      ? ? ?Other Instructions ? ? ?Important Information About Sugar ? ? ? ? ? ? ?

## 2022-03-01 LAB — HEPATIC FUNCTION PANEL
ALT: 13 IU/L (ref 0–32)
AST: 21 IU/L (ref 0–40)
Albumin: 4.3 g/dL (ref 3.5–4.6)
Alkaline Phosphatase: 98 IU/L (ref 44–121)
Bilirubin Total: 0.4 mg/dL (ref 0.0–1.2)
Bilirubin, Direct: <0.1 mg/dL (ref 0.00–0.40)
Total Protein: 6.5 g/dL (ref 6.0–8.5)

## 2022-03-01 LAB — LIPID PANEL
Chol/HDL Ratio: 3.3 ratio (ref 0.0–4.4)
Cholesterol, Total: 163 mg/dL (ref 100–199)
HDL: 50 mg/dL (ref >39)
LDL Chol Calc (NIH): 85 mg/dL (ref 0–99)
Triglycerides: 166 mg/dL — ABNORMAL HIGH (ref 0–149)
VLDL Cholesterol Cal: 28 mg/dL (ref 5–40)

## 2022-03-01 LAB — LDL CHOLESTEROL, DIRECT: LDL Direct: 81 mg/dL (ref 0–99)

## 2022-03-02 ENCOUNTER — Other Ambulatory Visit: Payer: Self-pay | Admitting: Nurse Practitioner

## 2022-03-02 DIAGNOSIS — G629 Polyneuropathy, unspecified: Secondary | ICD-10-CM

## 2022-03-03 DIAGNOSIS — M6281 Muscle weakness (generalized): Secondary | ICD-10-CM | POA: Diagnosis not present

## 2022-03-03 DIAGNOSIS — J449 Chronic obstructive pulmonary disease, unspecified: Secondary | ICD-10-CM | POA: Diagnosis not present

## 2022-03-03 DIAGNOSIS — R2681 Unsteadiness on feet: Secondary | ICD-10-CM | POA: Diagnosis not present

## 2022-03-03 DIAGNOSIS — R29898 Other symptoms and signs involving the musculoskeletal system: Secondary | ICD-10-CM | POA: Diagnosis not present

## 2022-03-03 DIAGNOSIS — I69898 Other sequelae of other cerebrovascular disease: Secondary | ICD-10-CM | POA: Diagnosis not present

## 2022-03-05 DIAGNOSIS — R29898 Other symptoms and signs involving the musculoskeletal system: Secondary | ICD-10-CM | POA: Diagnosis not present

## 2022-03-05 DIAGNOSIS — R2681 Unsteadiness on feet: Secondary | ICD-10-CM | POA: Diagnosis not present

## 2022-03-05 DIAGNOSIS — I69898 Other sequelae of other cerebrovascular disease: Secondary | ICD-10-CM | POA: Diagnosis not present

## 2022-03-05 DIAGNOSIS — J449 Chronic obstructive pulmonary disease, unspecified: Secondary | ICD-10-CM | POA: Diagnosis not present

## 2022-03-05 DIAGNOSIS — M6281 Muscle weakness (generalized): Secondary | ICD-10-CM | POA: Diagnosis not present

## 2022-03-06 ENCOUNTER — Ambulatory Visit (INDEPENDENT_AMBULATORY_CARE_PROVIDER_SITE_OTHER): Payer: Medicare Other

## 2022-03-06 DIAGNOSIS — G459 Transient cerebral ischemic attack, unspecified: Secondary | ICD-10-CM | POA: Diagnosis not present

## 2022-03-07 DIAGNOSIS — J449 Chronic obstructive pulmonary disease, unspecified: Secondary | ICD-10-CM | POA: Diagnosis not present

## 2022-03-07 DIAGNOSIS — R2681 Unsteadiness on feet: Secondary | ICD-10-CM | POA: Diagnosis not present

## 2022-03-07 DIAGNOSIS — R29898 Other symptoms and signs involving the musculoskeletal system: Secondary | ICD-10-CM | POA: Diagnosis not present

## 2022-03-07 DIAGNOSIS — I69898 Other sequelae of other cerebrovascular disease: Secondary | ICD-10-CM | POA: Diagnosis not present

## 2022-03-07 DIAGNOSIS — M6281 Muscle weakness (generalized): Secondary | ICD-10-CM | POA: Diagnosis not present

## 2022-03-10 DIAGNOSIS — R29898 Other symptoms and signs involving the musculoskeletal system: Secondary | ICD-10-CM | POA: Diagnosis not present

## 2022-03-10 DIAGNOSIS — R2681 Unsteadiness on feet: Secondary | ICD-10-CM | POA: Diagnosis not present

## 2022-03-10 DIAGNOSIS — M6281 Muscle weakness (generalized): Secondary | ICD-10-CM | POA: Diagnosis not present

## 2022-03-10 DIAGNOSIS — I69898 Other sequelae of other cerebrovascular disease: Secondary | ICD-10-CM | POA: Diagnosis not present

## 2022-03-10 DIAGNOSIS — J449 Chronic obstructive pulmonary disease, unspecified: Secondary | ICD-10-CM | POA: Diagnosis not present

## 2022-03-10 LAB — CUP PACEART REMOTE DEVICE CHECK
Date Time Interrogation Session: 20230515080716
Implantable Pulse Generator Implant Date: 20230309
Pulse Gen Serial Number: 172093

## 2022-03-12 ENCOUNTER — Other Ambulatory Visit: Payer: Self-pay | Admitting: Internal Medicine

## 2022-03-12 DIAGNOSIS — M6281 Muscle weakness (generalized): Secondary | ICD-10-CM | POA: Diagnosis not present

## 2022-03-12 DIAGNOSIS — I69898 Other sequelae of other cerebrovascular disease: Secondary | ICD-10-CM | POA: Diagnosis not present

## 2022-03-12 DIAGNOSIS — J449 Chronic obstructive pulmonary disease, unspecified: Secondary | ICD-10-CM

## 2022-03-12 DIAGNOSIS — R29898 Other symptoms and signs involving the musculoskeletal system: Secondary | ICD-10-CM | POA: Diagnosis not present

## 2022-03-12 DIAGNOSIS — R2681 Unsteadiness on feet: Secondary | ICD-10-CM | POA: Diagnosis not present

## 2022-03-13 NOTE — Progress Notes (Signed)
Carelink Summary Report / Loop Recorder 

## 2022-03-14 DIAGNOSIS — R29898 Other symptoms and signs involving the musculoskeletal system: Secondary | ICD-10-CM | POA: Diagnosis not present

## 2022-03-14 DIAGNOSIS — I69898 Other sequelae of other cerebrovascular disease: Secondary | ICD-10-CM | POA: Diagnosis not present

## 2022-03-14 DIAGNOSIS — J449 Chronic obstructive pulmonary disease, unspecified: Secondary | ICD-10-CM | POA: Diagnosis not present

## 2022-03-14 DIAGNOSIS — R2681 Unsteadiness on feet: Secondary | ICD-10-CM | POA: Diagnosis not present

## 2022-03-14 DIAGNOSIS — M6281 Muscle weakness (generalized): Secondary | ICD-10-CM | POA: Diagnosis not present

## 2022-03-17 DIAGNOSIS — R2681 Unsteadiness on feet: Secondary | ICD-10-CM | POA: Diagnosis not present

## 2022-03-17 DIAGNOSIS — I69898 Other sequelae of other cerebrovascular disease: Secondary | ICD-10-CM | POA: Diagnosis not present

## 2022-03-17 DIAGNOSIS — M6281 Muscle weakness (generalized): Secondary | ICD-10-CM | POA: Diagnosis not present

## 2022-03-17 DIAGNOSIS — R29898 Other symptoms and signs involving the musculoskeletal system: Secondary | ICD-10-CM | POA: Diagnosis not present

## 2022-03-17 DIAGNOSIS — J449 Chronic obstructive pulmonary disease, unspecified: Secondary | ICD-10-CM | POA: Diagnosis not present

## 2022-03-19 DIAGNOSIS — J449 Chronic obstructive pulmonary disease, unspecified: Secondary | ICD-10-CM | POA: Diagnosis not present

## 2022-03-19 DIAGNOSIS — R29898 Other symptoms and signs involving the musculoskeletal system: Secondary | ICD-10-CM | POA: Diagnosis not present

## 2022-03-19 DIAGNOSIS — M6281 Muscle weakness (generalized): Secondary | ICD-10-CM | POA: Diagnosis not present

## 2022-03-19 DIAGNOSIS — R2681 Unsteadiness on feet: Secondary | ICD-10-CM | POA: Diagnosis not present

## 2022-03-19 DIAGNOSIS — I69898 Other sequelae of other cerebrovascular disease: Secondary | ICD-10-CM | POA: Diagnosis not present

## 2022-03-21 DIAGNOSIS — J449 Chronic obstructive pulmonary disease, unspecified: Secondary | ICD-10-CM | POA: Diagnosis not present

## 2022-03-21 DIAGNOSIS — R29898 Other symptoms and signs involving the musculoskeletal system: Secondary | ICD-10-CM | POA: Diagnosis not present

## 2022-03-21 DIAGNOSIS — I69898 Other sequelae of other cerebrovascular disease: Secondary | ICD-10-CM | POA: Diagnosis not present

## 2022-03-21 DIAGNOSIS — R2681 Unsteadiness on feet: Secondary | ICD-10-CM | POA: Diagnosis not present

## 2022-03-21 DIAGNOSIS — M6281 Muscle weakness (generalized): Secondary | ICD-10-CM | POA: Diagnosis not present

## 2022-03-26 DIAGNOSIS — J449 Chronic obstructive pulmonary disease, unspecified: Secondary | ICD-10-CM | POA: Diagnosis not present

## 2022-03-26 DIAGNOSIS — M6281 Muscle weakness (generalized): Secondary | ICD-10-CM | POA: Diagnosis not present

## 2022-03-26 DIAGNOSIS — I69898 Other sequelae of other cerebrovascular disease: Secondary | ICD-10-CM | POA: Diagnosis not present

## 2022-03-26 DIAGNOSIS — R29898 Other symptoms and signs involving the musculoskeletal system: Secondary | ICD-10-CM | POA: Diagnosis not present

## 2022-03-26 DIAGNOSIS — R2681 Unsteadiness on feet: Secondary | ICD-10-CM | POA: Diagnosis not present

## 2022-03-27 ENCOUNTER — Other Ambulatory Visit: Payer: Self-pay | Admitting: Nurse Practitioner

## 2022-03-27 DIAGNOSIS — G629 Polyneuropathy, unspecified: Secondary | ICD-10-CM

## 2022-03-28 DIAGNOSIS — J449 Chronic obstructive pulmonary disease, unspecified: Secondary | ICD-10-CM | POA: Diagnosis not present

## 2022-03-28 DIAGNOSIS — M6281 Muscle weakness (generalized): Secondary | ICD-10-CM | POA: Diagnosis not present

## 2022-03-28 DIAGNOSIS — I69898 Other sequelae of other cerebrovascular disease: Secondary | ICD-10-CM | POA: Diagnosis not present

## 2022-03-28 DIAGNOSIS — R2681 Unsteadiness on feet: Secondary | ICD-10-CM | POA: Diagnosis not present

## 2022-03-31 DIAGNOSIS — M6281 Muscle weakness (generalized): Secondary | ICD-10-CM | POA: Diagnosis not present

## 2022-03-31 DIAGNOSIS — R2681 Unsteadiness on feet: Secondary | ICD-10-CM | POA: Diagnosis not present

## 2022-03-31 DIAGNOSIS — I69898 Other sequelae of other cerebrovascular disease: Secondary | ICD-10-CM | POA: Diagnosis not present

## 2022-03-31 DIAGNOSIS — J449 Chronic obstructive pulmonary disease, unspecified: Secondary | ICD-10-CM | POA: Diagnosis not present

## 2022-04-02 DIAGNOSIS — R2681 Unsteadiness on feet: Secondary | ICD-10-CM | POA: Diagnosis not present

## 2022-04-02 DIAGNOSIS — J449 Chronic obstructive pulmonary disease, unspecified: Secondary | ICD-10-CM | POA: Diagnosis not present

## 2022-04-02 DIAGNOSIS — M6281 Muscle weakness (generalized): Secondary | ICD-10-CM | POA: Diagnosis not present

## 2022-04-02 DIAGNOSIS — I69898 Other sequelae of other cerebrovascular disease: Secondary | ICD-10-CM | POA: Diagnosis not present

## 2022-04-04 DIAGNOSIS — J449 Chronic obstructive pulmonary disease, unspecified: Secondary | ICD-10-CM | POA: Diagnosis not present

## 2022-04-04 DIAGNOSIS — I69898 Other sequelae of other cerebrovascular disease: Secondary | ICD-10-CM | POA: Diagnosis not present

## 2022-04-04 DIAGNOSIS — M6281 Muscle weakness (generalized): Secondary | ICD-10-CM | POA: Diagnosis not present

## 2022-04-04 DIAGNOSIS — R2681 Unsteadiness on feet: Secondary | ICD-10-CM | POA: Diagnosis not present

## 2022-04-07 ENCOUNTER — Ambulatory Visit (INDEPENDENT_AMBULATORY_CARE_PROVIDER_SITE_OTHER): Payer: Medicare Other

## 2022-04-07 DIAGNOSIS — G459 Transient cerebral ischemic attack, unspecified: Secondary | ICD-10-CM

## 2022-04-08 LAB — CUP PACEART REMOTE DEVICE CHECK
Date Time Interrogation Session: 20230612074425
Implantable Pulse Generator Implant Date: 20230309
Pulse Gen Serial Number: 172093

## 2022-04-10 ENCOUNTER — Other Ambulatory Visit: Payer: Self-pay | Admitting: Nurse Practitioner

## 2022-04-25 NOTE — Progress Notes (Signed)
Carelink Summary Report / Loop Recorder 

## 2022-04-27 ENCOUNTER — Other Ambulatory Visit: Payer: Self-pay | Admitting: Nurse Practitioner

## 2022-04-27 DIAGNOSIS — G629 Polyneuropathy, unspecified: Secondary | ICD-10-CM

## 2022-04-30 ENCOUNTER — Non-Acute Institutional Stay: Payer: Medicare Other | Admitting: Family Medicine

## 2022-04-30 ENCOUNTER — Encounter: Payer: Self-pay | Admitting: Family Medicine

## 2022-04-30 VITALS — BP 126/74 | HR 74 | Temp 96.9°F | Ht 62.0 in | Wt 127.0 lb

## 2022-04-30 DIAGNOSIS — E7800 Pure hypercholesterolemia, unspecified: Secondary | ICD-10-CM | POA: Diagnosis not present

## 2022-04-30 DIAGNOSIS — J449 Chronic obstructive pulmonary disease, unspecified: Secondary | ICD-10-CM | POA: Diagnosis not present

## 2022-04-30 DIAGNOSIS — G459 Transient cerebral ischemic attack, unspecified: Secondary | ICD-10-CM

## 2022-04-30 DIAGNOSIS — I10 Essential (primary) hypertension: Secondary | ICD-10-CM
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
## 2022-05-01 ENCOUNTER — Encounter: Payer: Medicare Other | Admitting: Nurse Practitioner

## 2022-05-04 ENCOUNTER — Emergency Department (HOSPITAL_COMMUNITY): Payer: Medicare Other

## 2022-05-04 ENCOUNTER — Observation Stay (HOSPITAL_COMMUNITY)
Admission: EM | Admit: 2022-05-04 | Discharge: 2022-05-05 | Disposition: A | Discharge status: Home / Self Care | Payer: Medicare Other | Attending: Student | Admitting: Student

## 2022-05-04 ENCOUNTER — Encounter (HOSPITAL_COMMUNITY): Payer: Self-pay | Admitting: Emergency Medicine

## 2022-05-04 ENCOUNTER — Other Ambulatory Visit: Payer: Self-pay

## 2022-05-04 DIAGNOSIS — I10 Essential (primary) hypertension: Secondary | ICD-10-CM | POA: Diagnosis not present

## 2022-05-04 DIAGNOSIS — I639 Cerebral infarction, unspecified: Secondary | ICD-10-CM | POA: Diagnosis not present

## 2022-05-04 DIAGNOSIS — R29810 Facial weakness: Secondary | ICD-10-CM | POA: Diagnosis not present

## 2022-05-04 DIAGNOSIS — N1832 Chronic kidney disease, stage 3b: Secondary | ICD-10-CM | POA: Insufficient documentation

## 2022-05-04 DIAGNOSIS — Z79899 Other long term (current) drug therapy: Secondary | ICD-10-CM | POA: Insufficient documentation

## 2022-05-04 DIAGNOSIS — J449 Chronic obstructive pulmonary disease, unspecified: Secondary | ICD-10-CM | POA: Diagnosis not present

## 2022-05-04 DIAGNOSIS — G4489 Other headache syndrome: Secondary | ICD-10-CM | POA: Diagnosis not present

## 2022-05-04 DIAGNOSIS — I129 Hypertensive chronic kidney disease with stage 1 through stage 4 chronic kidney disease, or unspecified chronic kidney disease: Secondary | ICD-10-CM | POA: Insufficient documentation

## 2022-05-04 DIAGNOSIS — Z20822 Contact with and (suspected) exposure to covid-19: Secondary | ICD-10-CM | POA: Insufficient documentation

## 2022-05-04 DIAGNOSIS — R4701 Aphasia: Secondary | ICD-10-CM | POA: Diagnosis not present

## 2022-05-04 DIAGNOSIS — R200 Anesthesia of skin: Secondary | ICD-10-CM | POA: Diagnosis present

## 2022-05-04 DIAGNOSIS — G459 Transient cerebral ischemic attack, unspecified: Secondary | ICD-10-CM | POA: Diagnosis not present

## 2022-05-04 DIAGNOSIS — I119 Hypertensive heart disease without heart failure: Secondary | ICD-10-CM | POA: Diagnosis not present

## 2022-05-04 DIAGNOSIS — Z8673 Personal history of transient ischemic attack (TIA), and cerebral infarction without residual deficits: Secondary | ICD-10-CM | POA: Insufficient documentation

## 2022-05-04 DIAGNOSIS — Z7902 Long term (current) use of antithrombotics/antiplatelets: Secondary | ICD-10-CM | POA: Diagnosis not present

## 2022-05-04 DIAGNOSIS — N1831 Chronic kidney disease, stage 3a: Secondary | ICD-10-CM | POA: Diagnosis present

## 2022-05-04 DIAGNOSIS — G8190 Hemiplegia, unspecified affecting unspecified side: Secondary | ICD-10-CM | POA: Diagnosis not present

## 2022-05-04 DIAGNOSIS — I6381 Other cerebral infarction due to occlusion or stenosis of small artery: Secondary | ICD-10-CM | POA: Diagnosis not present

## 2022-05-04 LAB — COMPREHENSIVE METABOLIC PANEL
ALT: 12 U/L (ref 0–44)
AST: 18 U/L (ref 15–41)
Albumin: 3.6 g/dL (ref 3.5–5.0)
Alkaline Phosphatase: 71 U/L (ref 38–126)
Anion gap: 6 (ref 5–15)
BUN: 20 mg/dL (ref 8–23)
CO2: 22 mmol/L (ref 22–32)
Calcium: 8.9 mg/dL (ref 8.9–10.3)
Chloride: 114 mmol/L — ABNORMAL HIGH (ref 98–111)
Creatinine, Ser: 1.22 mg/dL — ABNORMAL HIGH (ref 0.44–1.00)
GFR, Estimated: 42 mL/min — ABNORMAL LOW (ref >60)
Glucose, Bld: 110 mg/dL — ABNORMAL HIGH (ref 70–99)
Potassium: 4.4 mmol/L (ref 3.5–5.1)
Sodium: 142 mmol/L (ref 135–145)
Total Bilirubin: 0.8 mg/dL (ref 0.3–1.2)
Total Protein: 6.8 g/dL (ref 6.5–8.1)

## 2022-05-04 LAB — DIFFERENTIAL
Abs Immature Granulocytes: 0.03 10*3/uL (ref 0.00–0.07)
Basophils Absolute: 0 10*3/uL (ref 0.0–0.1)
Basophils Relative: 1 %
Eosinophils Absolute: 0.2 10*3/uL (ref 0.0–0.5)
Eosinophils Relative: 3 %
Immature Granulocytes: 1 %
Lymphocytes Relative: 15 %
Lymphs Abs: 1 10*3/uL (ref 0.7–4.0)
Monocytes Absolute: 0.5 10*3/uL (ref 0.1–1.0)
Monocytes Relative: 8 %
Neutro Abs: 4.7 10*3/uL (ref 1.7–7.7)
Neutrophils Relative %: 72 %

## 2022-05-04 LAB — RAPID URINE DRUG SCREEN, HOSP PERFORMED
Amphetamines: NOT DETECTED
Barbiturates: NOT DETECTED
Benzodiazepines: NOT DETECTED
Cocaine: NOT DETECTED
Opiates: NOT DETECTED
Tetrahydrocannabinol: NOT DETECTED

## 2022-05-04 LAB — URINALYSIS, ROUTINE W REFLEX MICROSCOPIC
Bacteria, UA: NONE SEEN
Bilirubin Urine: NEGATIVE
Glucose, UA: NEGATIVE mg/dL
Ketones, ur: NEGATIVE mg/dL
Nitrite: NEGATIVE
Protein, ur: NEGATIVE mg/dL
Specific Gravity, Urine: 1.011 (ref 1.005–1.030)
pH: 6 (ref 5.0–8.0)

## 2022-05-04 LAB — CBC
HCT: 40.6 % (ref 36.0–46.0)
Hemoglobin: 13.2 g/dL (ref 12.0–15.0)
MCH: 29.5 pg (ref 26.0–34.0)
MCHC: 32.5 g/dL (ref 30.0–36.0)
MCV: 90.8 fL (ref 80.0–100.0)
Platelets: 187 10*3/uL (ref 150–400)
RBC: 4.47 MIL/uL (ref 3.87–5.11)
RDW: 15.3 % (ref 11.5–15.5)
WBC: 6.4 10*3/uL (ref 4.0–10.5)
nRBC: 0 % (ref 0.0–0.2)

## 2022-05-04 LAB — APTT: aPTT: 27 seconds (ref 24–36)

## 2022-05-04 LAB — RESP PANEL BY RT-PCR (FLU A&B, COVID) ARPGX2
Influenza A by PCR: NEGATIVE
Influenza B by PCR: NEGATIVE
SARS Coronavirus 2 by RT PCR: NEGATIVE

## 2022-05-04 LAB — ETHANOL: Alcohol, Ethyl (B): <10 mg/dL (ref <10)

## 2022-05-04 LAB — PROTIME-INR
INR: 1.1 (ref 0.8–1.2)
Prothrombin Time: 13.9 seconds (ref 11.4–15.2)

## 2022-05-04 MED ORDER — CLOPIDOGREL BISULFATE 75 MG PO TABS
75.0000 mg | ORAL_TABLET | Freq: Every day | ORAL | Status: DC
  Administered 2022-05-05: 75 mg via ORAL
  Filled 2022-05-04: qty 1

## 2022-05-04 MED ORDER — ACETAMINOPHEN 650 MG RE SUPP: 650.0000 mg | RECTAL | Status: DC | PRN

## 2022-05-04 MED ORDER — STROKE: EARLY STAGES OF RECOVERY BOOK
Freq: Once | Status: DC
  Filled 2022-05-04: qty 1

## 2022-05-04 MED ORDER — MOMETASONE FURO-FORMOTEROL FUM 200-5 MCG/ACT IN AERO: 2.0000 | INHALATION_SPRAY | Freq: Two times a day (BID) | RESPIRATORY_TRACT | Status: DC

## 2022-05-04 MED ORDER — ROSUVASTATIN CALCIUM 20 MG PO TABS
40.0000 mg | ORAL_TABLET | Freq: Every day | ORAL | Status: DC
  Administered 2022-05-05: 40 mg via ORAL
  Filled 2022-05-04: qty 2

## 2022-05-04 MED ORDER — ALBUTEROL SULFATE HFA 108 (90 BASE) MCG/ACT IN AERS
2.0000 | INHALATION_SPRAY | Freq: Four times a day (QID) | RESPIRATORY_TRACT | Status: DC | PRN
Start: 2022-05-04 — End: 2022-05-05

## 2022-05-04 MED ORDER — ACETAMINOPHEN 160 MG/5ML PO SOLN: 650.0000 mg | ORAL | Status: DC | PRN

## 2022-05-04 MED ORDER — ACETAMINOPHEN 325 MG PO TABS: 650.0000 mg | ORAL_TABLET | ORAL | Status: DC | PRN

## 2022-05-04 MED ORDER — SENNOSIDES-DOCUSATE SODIUM 8.6-50 MG PO TABS: 1.0000 | ORAL_TABLET | Freq: Every evening | ORAL | Status: DC | PRN

## 2022-05-04 MED ORDER — LABETALOL HCL 5 MG/ML IV SOLN
10.0000 mg | INTRAVENOUS | Status: DC | PRN
Start: 2022-05-04 — End: 2022-05-05

## 2022-05-04 MED ORDER — ENOXAPARIN SODIUM 30 MG/0.3ML IJ SOSY
30.0000 mg | PREFILLED_SYRINGE | INTRAMUSCULAR | Status: DC
  Administered 2022-05-05: 30 mg via SUBCUTANEOUS
  Filled 2022-05-04: qty 0.3

## 2022-05-04 NOTE — ED Notes (Signed)
Patient ambulated to the restroom while using a walker.

## 2022-05-04 NOTE — ED Provider Notes (Signed)
[facility] DEPT Provider Note  CSN: [id number] Arrival date & time: 05/04/22 2118  Chief Complaint(s) stroke like symptoms  HPI Tina Mercado is a 88 y.o. female with PMH previous TIA, HTN, mitral regurg, COPD who presents emergency department for evaluation of strokelike symptoms.  Patient states that at approximately 8 PM on 05/04/2022 she had acute onset left-sided facial tingling and weakness with associated left upper extremity weakness.  Her symptoms lasted approximately 15 minutes and on EMS arrival at 8:15 PM her symptoms had resolved.  She denies associated chest pain, shortness of breath, abdominal pain, nausea, vomiting or other systemic symptoms.  Does endorse mild headache.   Past Medical History Past Medical History:  Diagnosis Date   Allergy    Arrhythmia    Cataract    COPD (chronic obstructive pulmonary disease) (HCC)    GERD (gastroesophageal reflux disease)    Glaucoma    Hearing loss    does not wear hearing aids   Heart murmur    History of anemia    History of diastolic dysfunction    History of seasonal allergies    Hyperlipidemia    Hypertension    Large hiatal hernia    see on thoracic spine xray   Mitral regurgitation    mild to moderate   Osteoporosis    TIA (transient ischemic attack)    Wears glasses    readers   Patient Active Problem List   Diagnosis Date Noted   Anemia 01/30/2022   Status post placement of implantable loop recorder 01/30/2022   TIA (transient ischemic attack) 12/31/2021   Glaucoma 12/31/2021   Edema 06/20/2020   UTI (urinary tract infection) 05/31/2020   Slow transit constipation 05/17/2020   Dry eyes 05/17/2020   NAGMA 05/11/2020   Hip fracture ([place]) 05/11/2020   Closed fracture of left inferior pubic ramus ([place])    Fall    Nondisplaced fracture of left inferior pubic rami 05/10/2020   Suspected COVID-19 virus infection 04/19/2019   Adult failure to thrive syndrome 04/18/2019    Acute respiratory failure with hypoxia ([place]) 04/16/2019   HCAP (healthcare-associated pneumonia) 04/16/2019   Anorexia 04/16/2019   Hypertension    Hyponatremia 04/10/2019   Generalized weakness 04/10/2019   Hypoxemia 04/10/2019   Prediabetes 09/28/2017   Dizziness 09/15/2017   Neuropathy 07/16/2017   COPD mixed type ([place]) 04/10/2015   Laryngopharyngeal reflux (LPR) 04/10/2015   Chronic laryngitis 08/17/2014   Blindness and low vision 04/13/2013   Osteoporosis 12/23/2012   Bronchitis 10/30/2011   Benign hypertensive heart disease without heart failure 08/05/2011   Pure hypercholesterolemia 08/05/2011   Mitral regurgitation    History of anemia    History of diastolic dysfunction    GERD (gastroesophageal reflux disease)    History of seasonal allergies    Large hiatal hernia 11/23/2004   Home Medication(s) Prior to Admission medications   Medication Sig Start Date End Date Taking? Authorizing Provider  acetaminophen (TYLENOL) 325 MG tablet Take 2 tablets (650 mg total) by mouth every 6 (six) hours as needed for mild pain, moderate pain or fever. 05/16/20   Maudie Mercury, MD  [facility] 115-21 MCG/ACT inhaler 2 PUFFS INHALED EVERY 12 HOURS HRS. ALWAYS RINSE MOUTH AFTER USE. 03/13/22   Mast, Man X, NP  amitriptyline (ELAVIL) 10 MG tablet TAKE 1 TABLET BY MOUTH EVERYDAY AT BEDTIME 03/03/22   Mast, Man X, NP  Cholecalciferol (VITAMIN D) 50 MCG (2000 UT) CAPS Take 2,000 Units by mouth at  bedtime.    [provider]  clopidogrel (PLAVIX) 75 MG tablet TAKE 1 TABLET BY MOUTH EVERY DAY 04/10/22   Fargo, Amy E, NP  docusate sodium (COLACE) 100 MG capsule Take 200 mg by mouth at bedtime.    [provider]  famotidine (PEPCID) 20 MG tablet Take 1 tablet (20 mg total) by mouth daily. 01/30/22   Mast, Man X, NP  ketorolac (ACULAR) 0.5 % ophthalmic solution Place 1 drop into the right eye 2 (two) times daily. 06/21/20   Mast, Man X, NP  rosuvastatin (CRESTOR) 40 MG tablet Take 1  tablet (40 mg total) by mouth daily. 01/03/22   Hosie Poisson, MD  timolol (TIMOPTIC) 0.5 % ophthalmic solution INSTILL 1 DROP INTO BOTH EYES TWICE A DAY 08/28/21   Virgie Dad, MD  triamcinolone 0.1% oint-Eucerin equivalent cream 1:1 mixture Apply topically 2 (two) times daily as needed. 02/28/22   Ledora Bottcher, PA                                                                                                                                    Past Surgical History Past Surgical History:  Procedure Laterality Date   APPENDECTOMY     BREAST SURGERY     childbirth     x 1   CORNEAL TRANSPLANT  july 2007   EYE SURGERY     INTRAMEDULLARY (IM) NAIL INTERTROCHANTERIC Left 03/03/2019   Procedure: INTRAMEDULLARY (IM) NAIL INTERTROCHANTRIC;  Surgeon: Shona Needles, MD;  Location: [facility];  Service: Orthopedics;  Laterality: Left;   LOOP RECORDER INSERTION N/A 01/02/2022   Procedure: LOOP RECORDER INSERTION;  Surgeon: Vickie Epley, MD;  Location: [facility];  Service: Cardiovascular;  Laterality: N/A;   TONSILLECTOMY AND ADENOIDECTOMY     age 17yr   Family History Family History  Problem Relation Age of Onset   Heart disease Mother    Emphysema Father    Emphysema Brother    COPD Brother     Social History Social History   Tobacco Use   Smoking status: Never   Smokeless tobacco: Never  Vaping Use   Vaping Use: Never used  Substance Use Topics   Alcohol use: No    Alcohol/week: 0.0 standard drinks of alcohol   Drug use: No   Allergies Contrast media [iodinated contrast media]; Antihistamines, diphenhydramine-type; Celebrex [celecoxib]; Darvocet [propoxyphene n-acetaminophen]; Demerol; Diphenhydramine hcl; Feldene [piroxicam]; Gabapentin; Meperidine; Norvasc [amlodipine besylate]; Propoxyphene; and Iodine-131  Review of Systems Review of Systems  Neurological:  Positive for weakness, numbness and headaches.    Physical Exam Vital Signs  I have reviewed the  triage vital signs BP 91/73 (BP Location: Right Arm)   Pulse 70   Temp 98.1 F (36.7 C)   Resp 18   Ht '5\' 2"'$  (1.575 m)   Wt 57.6 kg   LMP  (LMP Unknown)   SpO2 98%  BMI 23.23 kg/m   Physical Exam Vitals and nursing note reviewed.  Constitutional:      General: She is not in acute distress.    Appearance: She is well-developed.  HENT:     Head: Normocephalic and atraumatic.  Eyes:     Conjunctiva/sclera: Conjunctivae normal.  Cardiovascular:     Rate and Rhythm: Normal rate and regular rhythm.     Heart sounds: No murmur heard. Pulmonary:     Effort: Pulmonary effort is normal. No respiratory distress.     Breath sounds: Normal breath sounds.  Abdominal:     Palpations: Abdomen is soft.     Tenderness: There is no abdominal tenderness.  Musculoskeletal:        General: No swelling.     Cervical back: Neck supple.  Skin:    General: Skin is warm and dry.     Capillary Refill: Capillary refill takes less than 2 seconds.  Neurological:     General: No focal deficit present.     Mental Status: She is alert.     Cranial Nerves: No cranial nerve deficit.     Sensory: No sensory deficit.     Motor: No weakness.  Psychiatric:        Mood and Affect: Mood normal.     ED Results and Treatments Labs (all labs ordered are listed, but only abnormal results are displayed) Labs Reviewed  COMPREHENSIVE METABOLIC PANEL - Abnormal; Notable for the following components:      Result Value   Chloride 114 (*)    Glucose, Bld 110 (*)    Creatinine, Ser 1.22 (*)    GFR, Estimated 42 (*)    All other components within normal limits  RESP PANEL BY RT-PCR (FLU A&B, COVID) ARPGX2  ETHANOL  PROTIME-INR  APTT  CBC  DIFFERENTIAL  RAPID URINE DRUG SCREEN, HOSP PERFORMED  URINALYSIS, ROUTINE W REFLEX MICROSCOPIC                                                                                                                          Radiology CT Head Wo Contrast  Result Date:  05/04/2022 CLINICAL DATA:  Left-sided facial droop EXAM: CT HEAD WITHOUT CONTRAST TECHNIQUE: Contiguous axial images were obtained from the base of the skull through the vertex without intravenous contrast. RADIATION DOSE REDUCTION: This exam was performed according to the departmental dose-optimization program which includes automated exposure control, adjustment of the mA and/or kV according to patient size and/or use of iterative reconstruction technique. COMPARISON:  MRI 12/31/2021, CT 12/31/2021 FINDINGS: Brain: No acute territorial infarction, hemorrhage or intracranial mass. Chronic right parietal infarct. Small chronic left parietal infarct. Small chronic appearing lacunar infarct in the right basal ganglia. Small chronic cerebellar infarcts. Mild atrophy and chronic small vessel ischemic changes of the white matter. Stable ventricle size Vascular: No hyperdense vessels.  Carotid vascular calcification Skull: Normal. Negative for fracture or focal lesion. Sinuses/Orbits: No acute finding. Other: None IMPRESSION: 1. No  CT evidence for acute intracranial abnormality. 2. Mild atrophy and chronic small vessel ischemic changes of the white matter. Multiple chronic infarcts. Electronically Signed   By: Donavan Foil M.D.   On: 05/04/2022 22:27    Pertinent labs & imaging results that were available during my care of the patient were reviewed by me and considered in my medical decision making (see MDM for details).  Medications Ordered in ED Medications - No data to display                                                                                                                                   Procedures Procedures  (including critical care time)  Medical Decision Making / ED Course   This patient presents to the ED for concern of strokelike symptoms, this involves an extensive number of treatment options, and is a complaint that carries with it a high risk of complications and morbidity.  The  differential diagnosis includes CVA, TIA, brain mass, electrolyte abnormality, recrudescence  MDM: Patient seen emergency room for evaluation of strokelike symptoms.  Physical exam is unremarkable with no persistent focal neurologic findings including no sensorimotor deficit, no cranial nerve deficit.  Vibration unremarkable.  CT head negative.  CT angio unable to be obtained due to patient's contrast allergy and thus she will require MR angiography of the brain and neck.  Neurology consulted and I spoke with Dr. Leonel Ramsay who recommends medical admission for stroke work-up and transfer to [facility] for MRI.  Patient then admitted.   Additional history obtained: -Additional history obtained from caretaker -External records from outside source obtained and reviewed including: Chart review including previous notes, labs, imaging, consultation notes   Lab Tests: -I ordered, reviewed, and interpreted labs.   The pertinent results include:   Labs Reviewed  COMPREHENSIVE METABOLIC PANEL - Abnormal; Notable for the following components:      Result Value   Chloride 114 (*)    Glucose, Bld 110 (*)    Creatinine, Ser 1.22 (*)    GFR, Estimated 42 (*)    All other components within normal limits  RESP PANEL BY RT-PCR (FLU A&B, COVID) ARPGX2  ETHANOL  PROTIME-INR  APTT  CBC  DIFFERENTIAL  RAPID URINE DRUG SCREEN, HOSP PERFORMED  URINALYSIS, ROUTINE W REFLEX MICROSCOPIC      EKG   EKG Interpretation  Date/Time:  Sunday May 04 2022 21:37:59 EDT Ventricular Rate:  73 PR Interval:  338 QRS Duration: 84 QT Interval:  413 QTC Calculation: 456 R Axis:   16 Text Interpretation: Sinus rhythm Prolonged PR interval Confirmed by Owynn Mosqueda ([id number]) on 05/04/2022 10:41:05 PM         Imaging Studies ordered: I ordered imaging studies including CT head I independently visualized and interpreted imaging. I agree with the radiologist interpretation   Medicines ordered and prescription  drug management: No orders of the defined types were placed in this  encounter.   -I have reviewed the patients home medicines and have made adjustments as needed  Critical interventions none  Consultations Obtained: I requested consultation with the neurologist Dr. Leonel Ramsay,  and discussed lab and imaging findings as well as pertinent plan - they recommend: [place] [facility] for MRI and completion of TIA work-up   Cardiac Monitoring: The patient was maintained on a cardiac monitor.  I personally viewed and interpreted the cardiac monitored which showed an underlying rhythm of: NSR  Social Determinants of Health:  Factors impacting patients care include: none   Reevaluation: After the interventions noted above, I reevaluated the patient and found that they have :improved  Co morbidities that complicate the patient evaluation  Past Medical History:  Diagnosis Date   Allergy    Arrhythmia    Cataract    COPD (chronic obstructive pulmonary disease) (HCC)    GERD (gastroesophageal reflux disease)    Glaucoma    Hearing loss    does not wear hearing aids   Heart murmur    History of anemia    History of diastolic dysfunction    History of seasonal allergies    Hyperlipidemia    Hypertension    Large hiatal hernia    see on thoracic spine xray   Mitral regurgitation    mild to moderate   Osteoporosis    TIA (transient ischemic attack)    Wears glasses    readers      Dispostion: I considered admission for this patient, and given concern for TIA and need for MR angiography, patient will require admission     Final Clinical Impression(s) / ED Diagnoses Final diagnoses:  None     '@PCDICTATION'$ @    Teressa Lower, MD 05/04/22 2241
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
## 2022-05-04 NOTE — ED Triage Notes (Signed)
[facility] called out for left sided facial droop, weakness and aphasia reported by a friend visiting at 2000; hx pacemaker, stroke and TIAs; s/s resolved PTA by EMS at 2015, pt negative on stroke scale and FAST; CBG 99; v/s WNL

## 2022-05-05 ENCOUNTER — Observation Stay (HOSPITAL_COMMUNITY): Payer: Medicare Other

## 2022-05-05 DIAGNOSIS — G459 Transient cerebral ischemic attack, unspecified: Secondary | ICD-10-CM

## 2022-05-05 DIAGNOSIS — N1831 Chronic kidney disease, stage 3a: Secondary | ICD-10-CM

## 2022-05-05 DIAGNOSIS — I639 Cerebral infarction, unspecified: Secondary | ICD-10-CM | POA: Diagnosis not present

## 2022-05-05 DIAGNOSIS — J449 Chronic obstructive pulmonary disease, unspecified: Secondary | ICD-10-CM | POA: Diagnosis not present

## 2022-05-05 DIAGNOSIS — I10 Essential (primary) hypertension: Secondary | ICD-10-CM | POA: Diagnosis not present

## 2022-05-05 LAB — COMPREHENSIVE METABOLIC PANEL
ALT: 11 U/L (ref 0–44)
AST: 21 U/L (ref 15–41)
Albumin: 3.1 g/dL — ABNORMAL LOW (ref 3.5–5.0)
Alkaline Phosphatase: 61 U/L (ref 38–126)
Anion gap: 4 — ABNORMAL LOW (ref 5–15)
BUN: 17 mg/dL (ref 8–23)
CO2: 21 mmol/L — ABNORMAL LOW (ref 22–32)
Calcium: 8.5 mg/dL — ABNORMAL LOW (ref 8.9–10.3)
Chloride: 115 mmol/L — ABNORMAL HIGH (ref 98–111)
Creatinine, Ser: 1.08 mg/dL — ABNORMAL HIGH (ref 0.44–1.00)
GFR, Estimated: 48 mL/min — ABNORMAL LOW (ref >60)
Glucose, Bld: 78 mg/dL (ref 70–99)
Potassium: 4.2 mmol/L (ref 3.5–5.1)
Sodium: 140 mmol/L (ref 135–145)
Total Bilirubin: 1.1 mg/dL (ref 0.3–1.2)
Total Protein: 5.8 g/dL — ABNORMAL LOW (ref 6.5–8.1)

## 2022-05-05 LAB — CBC
HCT: 37.7 % (ref 36.0–46.0)
Hemoglobin: 12.2 g/dL (ref 12.0–15.0)
MCH: 29.5 pg (ref 26.0–34.0)
MCHC: 32.4 g/dL (ref 30.0–36.0)
MCV: 91.3 fL (ref 80.0–100.0)
Platelets: 161 10*3/uL (ref 150–400)
RBC: 4.13 MIL/uL (ref 3.87–5.11)
RDW: 15.2 % (ref 11.5–15.5)
WBC: 6.8 10*3/uL (ref 4.0–10.5)
nRBC: 0 % (ref 0.0–0.2)

## 2022-05-05 LAB — LIPID PANEL
Cholesterol: 122 mg/dL (ref 0–200)
HDL: 34 mg/dL — ABNORMAL LOW (ref >40)
LDL Cholesterol: 63 mg/dL (ref 0–99)
Total CHOL/HDL Ratio: 3.6 RATIO
Triglycerides: 127 mg/dL (ref <150)
VLDL: 25 mg/dL (ref 0–40)

## 2022-05-05 LAB — HEMOGLOBIN A1C
Hgb A1c MFr Bld: 5.9 % — ABNORMAL HIGH (ref 4.8–5.6)
Mean Plasma Glucose: 122.63 mg/dL

## 2022-05-05 MED ORDER — GADOBUTROL 1 MMOL/ML IV SOLN
6.0000 mL | Freq: Once | INTRAVENOUS | Status: AC | PRN
  Administered 2022-05-05: 6 mL via INTRAVENOUS

## 2022-05-05 MED ORDER — ASPIRIN 81 MG PO TBEC: 81.0000 mg | DELAYED_RELEASE_TABLET | Freq: Every day | ORAL | 1 refills | Status: AC

## 2022-05-05 MED ORDER — ASPIRIN 81 MG PO TBEC
81.0000 mg | DELAYED_RELEASE_TABLET | Freq: Every day | ORAL | Status: DC
  Administered 2022-05-05: 81 mg via ORAL
  Filled 2022-05-05: qty 1

## 2022-05-05 MED ORDER — ALBUTEROL SULFATE (2.5 MG/3ML) 0.083% IN NEBU: 2.5000 mg | INHALATION_SOLUTION | Freq: Four times a day (QID) | RESPIRATORY_TRACT | Status: DC | PRN

## 2022-05-05 NOTE — ED Notes (Signed)
MRI contacted this RN to find out whether patient's outpatient cardiologist (Dr. Stanford Breed) is ok with her loop recorder being erased during the MRI. Pt is unsure of this. Floor coverage contact, plan will be to share this concern with daytime rounding team who may be able to reach cardiologist office once open.

## 2022-05-05 NOTE — Progress Notes (Signed)
OT Cancellation Note  Patient Details Name: Tina Mercado MRN: [id number] DOB: [birth year]   Cancelled Treatment:    Reason Eval/Treat Not Completed: Patient at procedure or test/ unavailable Patient is off hall at MRI at this time. OT to continue to follow and check back as schedule will allow.  Jackelyn Poling OTR/L, [facility] Office# [phone] Pager# [phone]  05/05/2022, 10:50 AM
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
## 2022-05-05 NOTE — ED Notes (Signed)
Pt transported to MRI 

## 2022-05-05 NOTE — Evaluation (Signed)
Physical Therapy Evaluation Patient Details Name: Tina Mercado MRN: [id number] DOB: [birth year] Today's Date: 05/05/2022  History of Present Illness  Tina Mercado is a 88 y.o. female presents for evaluation of strokelike symptoms. CT head negative. PMH: TIAs with loop recorder, HTN, mitral regurg, COPD, osteoporosis  Clinical Impression  Pt admitted with above diagnosis. Pt with symmetrical BLE, denies numbness/tingling, generalized weakness bilaterally. Pt ambulates with RW without unsteadiness noted, requires supv with bed mobility and transfers due to elevated gurney and for line management. Pt reports numbness/tingling sensation in face is gone and she is "almost" at baseline. Pt reports friend provides transportation and goes grocery shopping with her, pt eats at dining hall and uses RW/rollator in all settings at baseline. Pt currently with functional limitations due to the deficits listed below (see PT Problem List). Pt will benefit from skilled PT to increase their independence and safety with mobility to allow discharge to the venue listed below.          Recommendations for follow up therapy are one component of a multi-disciplinary discharge planning process, led by the attending physician.  Recommendations may be updated based on patient status, additional functional criteria and insurance authorization.  Follow Up Recommendations Outpatient PT (pt declines)      Assistance Recommended at Discharge    Patient can return home with the following  Assistance with cooking/housework;Assist for transportation    Equipment Recommendations None recommended by PT  Recommendations for Other Services       Functional Status Assessment Patient has had a recent decline in their functional status and demonstrates the ability to make significant improvements in function in a reasonable and predictable amount of time.     Precautions / Restrictions Precautions Precautions:  Fall Restrictions Weight Bearing Restrictions: No      Mobility  Bed Mobility Overal bed mobility: Needs Assistance Bed Mobility: Supine to Sit, Sit to Supine  Supine to sit: Supervision Sit to supine: Supervision  General bed mobility comments: increased time, supv for safety due to on elevated gurney and lines    Transfers Overall transfer level: Needs assistance Equipment used: Rolling walker (2 wheels) Transfers: Sit to/from Stand Sit to Stand: Supervision  General transfer comment: supv for safety, therapist managing lines    Ambulation/Gait Ambulation/Gait assistance: Supervision Gait Distance (Feet): 250 Feet Assistive device: Rolling walker (2 wheels) Gait Pattern/deviations: Step-through pattern, Decreased stride length, Trunk flexed Gait velocity: decreased  General Gait Details: step through pattern with trunk slightly flexed, equal bil step clearance, good bil foot clearance without foot drop noted, no knee buckling, no near falls, able to complete head turns and direction turns without LOB  Stairs            Wheelchair Mobility    Modified Rankin (Stroke Patients Only)       Balance Overall balance assessment: Needs assistance   Sitting balance-Leahy Scale: Good  Standing balance support: During functional activity, Bilateral upper extremity supported Standing balance-Leahy Scale: Fair Standing balance comment: static able to release BUE, dynamic using RW       Pertinent Vitals/Pain Pain Assessment Pain Assessment: No/denies pain    Home Living Family/patient expects to be discharged to:: Private residence ([place]) Living Arrangements: Alone Available Help at Discharge: Friend(s);Family;Available PRN/intermittently Type of Home: Independent living facility Home Access: Level entry  Home Layout: One level Home Equipment: Rollator (4 wheels);Grab bars - tub/shower;Grab bars - toilet;Hand held shower head      Prior Function  Prior Level of Function : Independent/Modified Independent;Needs assist  Mobility Comments: pt using rollator in the apartment and in community, last fall in 2021 ADLs Comments: pt reports ind with self care tasks, light cleaning, goes to dining hall for meals, cleaning lady 1x/month     Hand Dominance   Dominant Hand: Right    Extremity/Trunk Assessment   Upper Extremity Assessment Upper Extremity Assessment: Defer to OT evaluation (L grip slightly weaker than R; R hand dominant)    Lower Extremity Assessment Lower Extremity Assessment: RLE deficits/detail;LLE deficits/detail RLE Deficits / Details: AROM WNL, strength 4-/5 throughout, symmetrical, denies numbness/tingling, normal heel-to-shin test RLE Sensation: WNL RLE Coordination: WNL LLE Deficits / Details: AROM WNL, strength 4-/5 throughout, symmetrical, denies numbness/tingling, normal heel-to-shin test LLE Sensation: WNL LLE Coordination: WNL    Cervical / Trunk Assessment Cervical / Trunk Assessment: Kyphotic  Communication   Communication: HOH  Cognition Arousal/Alertness: Awake/alert Behavior During Therapy: WFL for tasks assessed/performed Overall Cognitive Status: Within Functional Limits for tasks assessed     General Comments      Exercises     Assessment/Plan    PT Assessment Patient needs continued PT services  PT Problem List Decreased strength;Decreased activity tolerance;Decreased balance       PT Treatment Interventions DME instruction;Gait training;Functional mobility training;Therapeutic activities;Therapeutic exercise;Balance training;Neuromuscular re-education;Patient/family education    PT Goals (Current goals can be found in the Care Plan section)  Acute Rehab PT Goals Patient Stated Goal: return to ILF PT Goal Formulation: With patient Time For Goal Achievement: 05/19/22 Potential to Achieve Goals: Good    Frequency Min 3X/week     Co-evaluation               AM-PAC PT "6  Clicks" Mobility  Outcome Measure Help needed turning from your back to your side while in a flat bed without using bedrails?: A Little Help needed moving from lying on your back to sitting on the side of a flat bed without using bedrails?: A Little Help needed moving to and from a bed to a chair (including a wheelchair)?: A Little Help needed standing up from a chair using your arms (e.g., wheelchair or bedside chair)?: A Little Help needed to walk in hospital room?: A Little Help needed climbing 3-5 steps with a railing? : A Little 6 Click Score: 18    End of Session Equipment Utilized During Treatment: Gait belt Activity Tolerance: Patient tolerated treatment well Patient left: in bed;with call bell/phone within reach Nurse Communication: Mobility status;Other (comment) (BP) PT Visit Diagnosis: Other abnormalities of gait and mobility (R26.89);Muscle weakness (generalized) (M62.81)    Time: 0973-5329 PT Time Calculation (min) (ACUTE ONLY): 24 min   Charges:   PT Evaluation $PT Eval Low Complexity: 1 Low PT Treatments $Gait Training: 8-22 mins         Tori Kasiah Manka PT, DPT 05/05/22, 11:11 AM

## 2022-05-05 NOTE — Consult Note (Signed)
Neurology Consultation Reason for Consult: TIA Referring Physician: Opyd, T  CC: Left face and arm numbness  History is obtained from:   HPI: Tina Mercado is a 88 y.o. female with a history of hypertension, hyperlipidemia, TIA in March who presents with transient left arm and face numbness and weakness.  She states that she noticed that the left side of her face felt like she had had anesthesia.  She then dropped a bowl of soup that she was holding.  The symptoms lasted approximately 15 minutes.  They completely resolved but she sought care in the emergency department for further evaluation.   LKW: 6 PM tpa given?: no, resolution of symptoms   Past Medical History:  Diagnosis Date   Allergy    Arrhythmia    Cataract    COPD (chronic obstructive pulmonary disease) (HCC)    GERD (gastroesophageal reflux disease)    Glaucoma    Hearing loss    does not wear hearing aids   Heart murmur    History of anemia    History of diastolic dysfunction    History of seasonal allergies    Hyperlipidemia    Hypertension    Large hiatal hernia    see on thoracic spine xray   Mitral regurgitation    mild to moderate   Osteoporosis    TIA (transient ischemic attack)    Wears glasses    readers     Family History  Problem Relation Age of Onset   Heart disease Mother    Emphysema Father    Emphysema Brother    COPD Brother      Social History:  reports that she has never smoked. She has never used smokeless tobacco. She reports that she does not drink alcohol and does not use drugs.   Exam: Current vital signs: BP 140/72   Pulse 66   Temp 98.1 F (36.7 C)   Resp 12   Ht '5\' 2"'$  (1.575 m)   Wt 57.6 kg   LMP  (LMP Unknown)   SpO2 96%   BMI 23.23 kg/m  Vital signs in last 24 hours: Temp:  [98.1 F (36.7 C)] 98.1 F (36.7 C) (07/09 2135) Pulse Rate:  [66-75] 66 (07/10 0530) Resp:  [12-22] 12 (07/10 0530) BP: (91-190)/(58-108) 140/72 (07/10 0530) SpO2:  [94 %-100 %]  96 % (07/10 0530) Weight:  [57.6 kg] 57.6 kg (07/09 2126)   Physical Exam  Constitutional: Appears well-developed and well-nourished.  Psych: Affect appropriate to situation Eyes: No scleral injection HENT: No OP obstruction MSK: no joint deformities.  Cardiovascular: Normal rate and regular rhythm.  Respiratory: Effort normal, non-labored breathing GI: Soft.  No distension. There is no tenderness.  Skin: WDI  Neuro: Mental Status: Patient is awake, alert, oriented to person, place, month, year, and situation. Patient is able to give a clear and coherent history. No signs of aphasia or neglect Cranial Nerves: II: Visual Fields are full. III,IV, VI: EOMI without ptosis or diploplia.  V: Facial sensation is symmetric to temperature VII: Facial movement is symmetric.  VIII: hearing is intact to voice X: Uvula elevates symmetrically XI: Shoulder shrug is symmetric. XII: tongue is midline without atrophy or fasciculations.  Motor: Tone is normal. Bulk is normal. 5/5 strength was present in all four extremities.  Sensory: Sensation is symmetric to light touch and temperature in the arms and legs. Cerebellar: FNF and HKS are intact bilaterally      I have reviewed labs in epic and  the results pertinent to this consultation are: Cr 1.2  I have reviewed the images obtained:CT head - negative  Impression: 88 year old with transient left arm and face numbness and weakness.  This does sound like transient ischemic attack.  Her last TIA 3 months ago was transient aphasia, so not stereotyped.  She had carotid Dopplers but no imaging of her posterior circulation previously, and I think that this could be helpful.  In the short-term I would favor dual antiplatelet therapy for 3 weeks, and continued maximal modification of risk factors.  Of note she has an implanted loop recorder with no recorded arrhythmias.  Recommendations: 1) continue Crestor 40 mg nightly 2) ASA 81 mg and Plavix  75 mg daily 3) MRI brain, MRA head and neck 4) continue monitoring cardiac rhythm with loop recorder 5) if the above is negative, then she can follow-up with outpatient neurology.  Roland Rack, MD [facility] [phone]  If 7pm- 7am, please page neurology on call as listed in [place].

## 2022-05-06 ENCOUNTER — Other Ambulatory Visit: Payer: Medicare Other

## 2022-05-06 ENCOUNTER — Ambulatory Visit: Payer: Self-pay

## 2022-05-06 NOTE — Patient Outreach (Signed)
  Care Coordination [facility] Note Transition Care Management Follow-up Telephone Call Date of discharge and from where: 05/05/22 [facility] How have you been since you were released from the hospital? Doing better Any questions or concerns? No  Items Reviewed: Did the pt receive and understand the discharge instructions provided? Yes  Medications obtained and verified? Yes  Other? No  Any new allergies since your discharge? No  Dietary orders reviewed? Yes Do you have support at home? Yes   Home Care and Equipment/Supplies: Were home health services ordered? no If so, what is the name of the agency?   Has the agency set up a time to come to the patient's home? not applicable Were any new equipment or medical supplies ordered?  No What is the name of the medical supply agency?  Were you able to get the supplies/equipment? not applicable Do you have any questions related to the use of the equipment or supplies? No  Functional Questionnaire: (I = Independent and D = Dependent) ADLs: I  Bathing/Dressing- I  Meal Prep- I  Eating- I  Maintaining continence- I  Transferring/Ambulation- I  Managing Meds- I  Follow up appointments reviewed:  PCP Hospital f/u appt confirmed? Yes  Scheduled to see Marlowe Sax, NP on 05/07/22 @ 1:00 pm. [facility] f/u appt confirmed? Yes  Scheduled to see Diona Browner, NP on 05/12/22 @ 10:05 am. Are transportation arrangements needed? No  If their condition worsens, is the pt aware to call PCP or go to the Emergency Dept.? Yes Was the patient provided with contact information for the PCP's office or ED? Yes Was to pt encouraged to call back with questions or concerns? Yes  SDOH assessments and interventions completed:   Yes  Care Coordination Interventions Activated:  No Care Coordination Interventions:   n/a  Encounter Outcome:  Pt. Visit Completed   Daneen Schick, BSW, CDP Social Worker, Certified Dementia Practitioner [facility] Care  Management [phone]

## 2022-05-06 NOTE — Patient Outreach (Signed)
  Care Coordination [facility] Note Transition Care Management Unsuccessful Follow-up Telephone Call  Date of discharge and from where:  05/05/22 Tina Mercado  Attempts:  1st Attempt  Reason for unsuccessful TCM follow-up call:  Left voice message  Daneen Schick, Arita Miss, CDP Social Worker, Certified Dementia Practitioner [place] [facility] [phone]

## 2022-05-07 ENCOUNTER — Ambulatory Visit (INDEPENDENT_AMBULATORY_CARE_PROVIDER_SITE_OTHER): Payer: Medicare Other | Admitting: Family

## 2022-05-07 ENCOUNTER — Encounter: Payer: Self-pay | Admitting: Family

## 2022-05-07 VITALS — BP 124/80 | HR 53 | Temp 95.9°F | Resp 20 | Ht 62.0 in | Wt 125.2 lb

## 2022-05-07 DIAGNOSIS — Z8673 Personal history of transient ischemic attack (TIA), and cerebral infarction without residual deficits: Secondary | ICD-10-CM

## 2022-05-07 DIAGNOSIS — I119 Hypertensive heart disease without heart failure: Secondary | ICD-10-CM | POA: Diagnosis not present

## 2022-05-07 DIAGNOSIS — E8809 Other disorders of plasma-protein metabolism, not elsewhere classified: Secondary | ICD-10-CM | POA: Diagnosis not present
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
## 2022-05-08 ENCOUNTER — Ambulatory Visit (INDEPENDENT_AMBULATORY_CARE_PROVIDER_SITE_OTHER): Payer: Medicare Other

## 2022-05-08 DIAGNOSIS — G459 Transient cerebral ischemic attack, unspecified: Secondary | ICD-10-CM | POA: Diagnosis not present

## 2022-05-08 LAB — CBC WITH DIFFERENTIAL/PLATELET
Absolute Monocytes: 533 cells/uL (ref 200–950)
Basophils Absolute: 37 cells/uL (ref 0–200)
Basophils Relative: 0.5 %
Eosinophils Absolute: 274 cells/uL (ref 15–500)
Eosinophils Relative: 3.7 %
HCT: 41.5 % (ref 35.0–45.0)
Hemoglobin: 13.4 g/dL (ref 11.7–15.5)
Lymphs Abs: 984 cells/uL (ref 850–3900)
MCH: 29.2 pg (ref 27.0–33.0)
MCHC: 32.3 g/dL (ref 32.0–36.0)
MCV: 90.4 fL (ref 80.0–100.0)
MPV: 9.5 fL (ref 7.5–12.5)
Monocytes Relative: 7.2 %
Neutro Abs: 5572 cells/uL (ref 1500–7800)
Neutrophils Relative %: 75.3 %
Platelets: 180 10*3/uL (ref 140–400)
RBC: 4.59 10*6/uL (ref 3.80–5.10)
RDW: 14.9 % (ref 11.0–15.0)
Total Lymphocyte: 13.3 %
WBC: 7.4 10*3/uL (ref 3.8–10.8)

## 2022-05-08 LAB — COMPLETE METABOLIC PANEL WITH GFR
AG Ratio: 1.7 (calc) (ref 1.0–2.5)
ALT: 8 U/L (ref 6–29)
AST: 16 U/L (ref 10–35)
Albumin: 4.1 g/dL (ref 3.6–5.1)
Alkaline phosphatase (APISO): 81 U/L (ref 37–153)
BUN/Creatinine Ratio: 15 (calc) (ref 6–22)
BUN: 20 mg/dL (ref 7–25)
CO2: 23 mmol/L (ref 20–32)
Calcium: 9.1 mg/dL (ref 8.6–10.4)
Chloride: 109 mmol/L (ref 98–110)
Creat: 1.36 mg/dL — ABNORMAL HIGH (ref 0.60–0.95)
Globulin: 2.4 g/dL (calc) (ref 1.9–3.7)
Glucose, Bld: 87 mg/dL (ref 65–139)
Potassium: 4.5 mmol/L (ref 3.5–5.3)
Sodium: 140 mmol/L (ref 135–146)
Total Bilirubin: 0.5 mg/dL (ref 0.2–1.2)
Total Protein: 6.5 g/dL (ref 6.1–8.1)
eGFR: 37 mL/min/{1.73_m2} — ABNORMAL LOW (ref >=60)

## 2022-05-12 ENCOUNTER — Encounter: Payer: Self-pay | Admitting: Nurse Practitioner

## 2022-05-12 ENCOUNTER — Ambulatory Visit (INDEPENDENT_AMBULATORY_CARE_PROVIDER_SITE_OTHER): Payer: Medicare Other | Admitting: Nurse Practitioner

## 2022-05-12 VITALS — BP 102/70 | HR 64 | Ht 62.0 in | Wt 125.8 lb

## 2022-05-12 DIAGNOSIS — E785 Hyperlipidemia, unspecified: Secondary | ICD-10-CM

## 2022-05-12 DIAGNOSIS — Z8673 Personal history of transient ischemic attack (TIA), and cerebral infarction without residual deficits: Secondary | ICD-10-CM | POA: Diagnosis not present

## 2022-05-12 DIAGNOSIS — I10 Essential (primary) hypertension: Secondary | ICD-10-CM

## 2022-05-12 DIAGNOSIS — I340 Nonrheumatic mitral (valve) insufficiency: Secondary | ICD-10-CM | POA: Diagnosis not present

## 2022-05-12 DIAGNOSIS — Z01810 Encounter for preprocedural cardiovascular examination: Secondary | ICD-10-CM

## 2022-05-12 DIAGNOSIS — G459 Transient cerebral ischemic attack, unspecified: Secondary | ICD-10-CM

## 2022-05-12 LAB — CUP PACEART REMOTE DEVICE CHECK
Date Time Interrogation Session: 20230717075816
Implantable Pulse Generator Implant Date: 20230309
Pulse Gen Serial Number: 172093
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
## 2022-05-12 NOTE — Patient Instructions (Signed)
Medication Instructions:  Your physician recommends that you continue on your current medications as directed. Please refer to the Current Medication list given to you today.   *If you need a refill on your cardiac medications before your next appointment, please call your pharmacy*   Lab Work: NONE ordered at this time of appointment   If you have labs (blood work) drawn today and your tests are completely normal, you will receive your results only by: [place] (if you have MyChart) OR A paper copy in the mail If you have any lab test that is abnormal or we need to change your treatment, we will call you to review the results.   Testing/Procedures: NONE ordered at this time of appointment     Follow-Up: At [facility], you and your health needs are our priority.  As part of our continuing mission to provide you with exceptional heart care, we have created designated Provider Care Teams.  These Care Teams include your primary Cardiologist (physician) and Advanced Practice Providers (APPs -  Physician Assistants and Nurse Practitioners) who all work together to provide you with the care you need, when you need it.  We recommend signing up for the patient portal called "MyChart".  Sign up information is provided on this After Visit Summary.  MyChart is used to connect with patients for Virtual Visits (Telemedicine).  Patients are able to view lab/test results, encounter notes, upcoming appointments, etc.  Non-urgent messages can be sent to your provider as well.   To learn more about what you can do with MyChart, go to [email or web address].    Your next appointment:    Keep 08/29/2022 app  The format for your next appointment:   In Person  Provider:   Kirk Ruths, MD     Other Instructions   Important Information About Sugar

## 2022-05-15 DIAGNOSIS — H401211 Low-tension glaucoma, right eye, mild stage: Secondary | ICD-10-CM | POA: Diagnosis not present

## 2022-05-15 DIAGNOSIS — Z961 Presence of intraocular lens: Secondary | ICD-10-CM | POA: Diagnosis not present

## 2022-05-15 DIAGNOSIS — H353132 Nonexudative age-related macular degeneration, bilateral, intermediate dry stage: Secondary | ICD-10-CM | POA: Diagnosis not present

## 2022-05-15 DIAGNOSIS — H35353 Cystoid macular degeneration, bilateral: Secondary | ICD-10-CM | POA: Diagnosis not present

## 2022-05-15 DIAGNOSIS — H2512 Age-related nuclear cataract, left eye: Secondary | ICD-10-CM | POA: Diagnosis not present

## 2022-05-15 DIAGNOSIS — Z947 Corneal transplant status: Secondary | ICD-10-CM | POA: Diagnosis not present

## 2022-05-21 ENCOUNTER — Other Ambulatory Visit: Payer: Self-pay | Admitting: Orthopedic Surgery

## 2022-05-23 NOTE — Progress Notes (Signed)
Carelink Summary Report / Loop Recorder 

## 2022-05-27 ENCOUNTER — Other Ambulatory Visit: Payer: Self-pay | Admitting: Nurse Practitioner

## 2022-05-27 DIAGNOSIS — G629 Polyneuropathy, unspecified: Secondary | ICD-10-CM

## 2022-05-31 ENCOUNTER — Other Ambulatory Visit: Payer: Self-pay | Admitting: Nurse Practitioner

## 2022-05-31 DIAGNOSIS — K219 Gastro-esophageal reflux disease without esophagitis: Secondary | ICD-10-CM

## 2022-06-16 ENCOUNTER — Ambulatory Visit (INDEPENDENT_AMBULATORY_CARE_PROVIDER_SITE_OTHER): Payer: Medicare Other

## 2022-06-16 DIAGNOSIS — Z8673 Personal history of transient ischemic attack (TIA), and cerebral infarction without residual deficits: Secondary | ICD-10-CM | POA: Diagnosis not present

## 2022-06-18 LAB — CUP PACEART REMOTE DEVICE CHECK
Date Time Interrogation Session: 20230821120858
Implantable Pulse Generator Implant Date: 20230309
Pulse Gen Serial Number: 172093

## 2022-06-19 ENCOUNTER — Ambulatory Visit (INDEPENDENT_AMBULATORY_CARE_PROVIDER_SITE_OTHER): Payer: Medicare Other | Admitting: Neurology

## 2022-06-19 ENCOUNTER — Encounter: Payer: Self-pay | Admitting: Neurology

## 2022-06-19 VITALS — BP 174/87 | HR 61 | Ht 62.0 in | Wt 127.0 lb

## 2022-06-19 DIAGNOSIS — Z8673 Personal history of transient ischemic attack (TIA), and cerebral infarction without residual deficits: Secondary | ICD-10-CM | POA: Diagnosis not present

## 2022-06-19 DIAGNOSIS — I639 Cerebral infarction, unspecified: Secondary | ICD-10-CM | POA: Diagnosis not present

## 2022-06-19 NOTE — Patient Instructions (Signed)
It was nice to see you again today.  I am glad to hear that you are feeling better.  Continue exercising regularly and take your medications as directed. As discussed, secondary prevention is key after a stroke. This means: taking care of blood sugar values or diabetes management (A1c goal of less than 7.0), good blood pressure (hypertension) control and optimizing cholesterol management (with LDL goal of less than 70), exercising daily or regularly within your own mobility limitations of course, use your walker at all times.  Continue with Plavix and aspirin, try to hydrate well with water.
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
## 2022-06-24 ENCOUNTER — Other Ambulatory Visit: Payer: Self-pay | Admitting: Nurse Practitioner

## 2022-06-24 DIAGNOSIS — G629 Polyneuropathy, unspecified: Secondary | ICD-10-CM

## 2022-06-24 NOTE — Telephone Encounter (Signed)
Do you usually refill patient Crestor? Looks like another provider is doing so. Message routed to PCP Sabra Heck Lillette Boxer, MD for confirmation.

## 2022-07-13 NOTE — Progress Notes (Signed)
Carelink Summary Report / Loop Recorder 

## 2022-07-21 ENCOUNTER — Ambulatory Visit (INDEPENDENT_AMBULATORY_CARE_PROVIDER_SITE_OTHER): Payer: Medicare Other

## 2022-07-21 DIAGNOSIS — Z8673 Personal history of transient ischemic attack (TIA), and cerebral infarction without residual deficits: Secondary | ICD-10-CM | POA: Diagnosis not present

## 2022-07-22 LAB — CUP PACEART REMOTE DEVICE CHECK
Date Time Interrogation Session: 20230925074133
Implantable Pulse Generator Implant Date: 20230309
Pulse Gen Serial Number: 172093

## 2022-07-23 ENCOUNTER — Other Ambulatory Visit: Payer: Self-pay | Admitting: Internal Medicine

## 2022-07-23 DIAGNOSIS — H409 Unspecified glaucoma: Secondary | ICD-10-CM

## 2022-08-01 NOTE — Progress Notes (Signed)
Carelink Summary Report / Loop Recorder 

## 2022-08-06 ENCOUNTER — Non-Acute Institutional Stay: Payer: Medicare Other | Admitting: Family Medicine

## 2022-08-06 ENCOUNTER — Encounter: Payer: Self-pay | Admitting: Family Medicine

## 2022-08-06 VITALS — BP 130/88 | HR 63 | Temp 95.4°F | Ht 62.0 in | Wt 125.4 lb

## 2022-08-06 DIAGNOSIS — R627 Adult failure to thrive: Secondary | ICD-10-CM

## 2022-08-06 DIAGNOSIS — E7800 Pure hypercholesterolemia, unspecified: Secondary | ICD-10-CM | POA: Diagnosis not present

## 2022-08-06 DIAGNOSIS — G459 Transient cerebral ischemic attack, unspecified: Secondary | ICD-10-CM

## 2022-08-06 DIAGNOSIS — N1831 Chronic kidney disease, stage 3a: Secondary | ICD-10-CM

## 2022-08-06 DIAGNOSIS — J449 Chronic obstructive pulmonary disease, unspecified: Secondary | ICD-10-CM | POA: Diagnosis not present

## 2022-08-06 DIAGNOSIS — I639 Cerebral infarction, unspecified: Secondary | ICD-10-CM
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
## 2022-08-15 NOTE — Progress Notes (Signed)
HPI: FU hypertension. Carotid dopplers 3/23 showed no significant stenosis. Had CVA 3/23. Echo 3/23 showed normal LV function, mild LAE. Venous dopplers 3/23 showed no DVT. Had ILR. Repeat TIA 7/23. Since last seen, she denies dyspnea, chest pain, palpitations or syncope.  Current Outpatient Medications  Medication Sig Dispense Refill   acetaminophen (TYLENOL) 325 MG tablet Take 2 tablets (650 mg total) by mouth every 6 (six) hours as needed for mild pain, moderate pain or fever. 120 tablet 0   ADVAIR HFA 115-21 MCG/ACT inhaler 2 PUFFS INHALED EVERY 12 HOURS HRS. ALWAYS RINSE MOUTH AFTER USE. (Patient taking differently: Inhale 2 puffs into the lungs 2 (two) times daily.) 12 each 11   amitriptyline (ELAVIL) 10 MG tablet TAKE 1 TABLET BY MOUTH EVERYDAY AT BEDTIME 90 tablet 3   amoxicillin (AMOXIL) 500 MG capsule Take 500 mg by mouth 3 (three) times daily. For dental procedures     aspirin EC 81 MG tablet Take 1 tablet (81 mg total) by mouth daily. Swallow whole. 30 tablet 1   Cholecalciferol (VITAMIN D) 50 MCG (2000 UT) CAPS Take 2,000 Units by mouth every evening.     clopidogrel (PLAVIX) 75 MG tablet TAKE 1 TABLET BY MOUTH EVERY DAY (Patient taking differently: Take 75 mg by mouth at bedtime.) 90 tablet 2   docusate sodium (COLACE) 100 MG capsule Take 200 mg by mouth at bedtime.     famotidine (PEPCID) 20 MG tablet TAKE 1 TABLET BY MOUTH EVERY DAY 30 tablet 3   ketorolac (ACULAR) 0.5 % ophthalmic solution Place 1 drop into the right eye 2 (two) times daily. 3 mL 0   rosuvastatin (CRESTOR) 40 MG tablet TAKE 1 TABLET BY MOUTH EVERY DAY 30 tablet 2   timolol (TIMOPTIC) 0.5 % ophthalmic solution INSTILL 1 DROP INTO BOTH EYES TWICE A DAY 15 mL 2   triamcinolone 0.1% oint-Eucerin equivalent cream 1:1 mixture Apply topically 2 (two) times daily as needed. (Patient taking differently: Apply 1 Application topically 2 (two) times daily as needed for itching.) 30 g 1   No current  facility-administered medications for this visit.     Past Medical History:  Diagnosis Date   Allergy    Arrhythmia    Cataract    COPD (chronic obstructive pulmonary disease) (HCC)    GERD (gastroesophageal reflux disease)    Glaucoma    Hearing loss    does not wear hearing aids   Heart murmur    History of anemia    History of diastolic dysfunction    History of seasonal allergies    Hyperlipidemia    Hypertension    Large hiatal hernia    see on thoracic spine xray   Mitral regurgitation    mild to moderate   Osteoporosis    TIA (transient ischemic attack)    Wears glasses    readers    Past Surgical History:  Procedure Laterality Date   APPENDECTOMY     BREAST SURGERY     childbirth     x 1   CORNEAL TRANSPLANT  july 2007   EYE SURGERY     INTRAMEDULLARY (IM) NAIL INTERTROCHANTERIC Left 03/03/2019   Procedure: INTRAMEDULLARY (IM) NAIL INTERTROCHANTRIC;  Surgeon: Shona Needles, MD;  Location: [facility];  Service: Orthopedics;  Laterality: Left;   LOOP RECORDER INSERTION N/A 01/02/2022   Procedure: LOOP RECORDER INSERTION;  Surgeon: Vickie Epley, MD;  Location: [facility];  Service: Cardiovascular;  Laterality: N/A;  TONSILLECTOMY AND ADENOIDECTOMY     age 34yr    Social History   Socioeconomic History   Marital status: Single    Spouse name: Not on file   Number of children: Not on file   Years of education: Not on file   Highest education level: Not on file  Occupational History   Not on file  Tobacco Use   Smoking status: Never   Smokeless tobacco: Never  Vaping Use   Vaping Use: Never used  Substance and Sexual Activity   Alcohol use: No    Alcohol/week: 0.0 standard drinks of alcohol   Drug use: No   Sexual activity: Never    Birth control/protection: Abstinence, Post-menopausal  Other Topics Concern   Not on file  Social History Narrative   Right handed   [facility]    Social  Determinants of [place]ert Shores[place]rain: Not on file  Food Insecurity: No Food Insecurity (05/06/2022)   Hunger Vital Sign    Worried About Running Out of Food in the Last Year: Never true    Ran Out of Food in the Last Year: Never true  Transportation Needs: No Transportation Needs (05/06/2022)   PRAPARE - THydrologist(Medical): No    Lack of Transportation (Non-Medical): No  Physical Activity: Not on file  Stress: Not on file  Social Connections: Not on file  Intimate Partner Violence: Not on file    Family History  Problem Relation Age of Onset   Heart disease Mother    Emphysema Father    Emphysema Brother    COPD Brother     ROS: no fevers or chills, productive cough, hemoptysis, dysphasia, odynophagia, melena, hematochezia, dysuria, hematuria, rash, seizure activity, orthopnea, PND, pedal edema, claudication. Remaining systems are negative.  Physical Exam: Well-developed well-nourished in no acute distress.  Skin is warm and dry.  HEENT is normal.  Neck is supple.  Chest is clear to auscultation with normal expansion.  Cardiovascular exam is regular rate and rhythm.  Abdominal exam nontender or distended. No masses palpated. Extremities show no edema. neuro grossly intact  A/P  1 [place]ior CVA/TIA-ILR in place; continue to monitor for atrial fibrillation. Continue ASA and plavix.  2 Hypertension-BP controlled.  3 Hyperlipidemia-continue statin.  4 MR-trace on most recent echo.  BKirk Ruths MD

## 2022-08-21 ENCOUNTER — Ambulatory Visit (INDEPENDENT_AMBULATORY_CARE_PROVIDER_SITE_OTHER): Payer: Medicare Other

## 2022-08-21 DIAGNOSIS — Z8673 Personal history of transient ischemic attack (TIA), and cerebral infarction without residual deficits: Secondary | ICD-10-CM | POA: Diagnosis not present

## 2022-08-25 LAB — CUP PACEART REMOTE DEVICE CHECK
Date Time Interrogation Session: 20231030004700
Implantable Pulse Generator Implant Date: 20230309
Pulse Gen Serial Number: 172093

## 2022-08-29 ENCOUNTER — Encounter: Payer: Self-pay | Admitting: Cardiology

## 2022-08-29 ENCOUNTER — Ambulatory Visit: Payer: Medicare Other | Attending: Cardiology | Admitting: Cardiology

## 2022-08-29 VITALS — BP 144/72 | HR 68 | Ht 62.0 in | Wt 126.4 lb

## 2022-08-29 DIAGNOSIS — Z8673 Personal history of transient ischemic attack (TIA), and cerebral infarction without residual deficits: Secondary | ICD-10-CM

## 2022-08-29 DIAGNOSIS — E785 Hyperlipidemia, unspecified: Secondary | ICD-10-CM

## 2022-08-29 DIAGNOSIS — I10 Essential (primary) hypertension: Secondary | ICD-10-CM

## 2022-08-29 DIAGNOSIS — I639 Cerebral infarction, unspecified: Secondary | ICD-10-CM

## 2022-08-29 NOTE — Patient Instructions (Signed)
  Follow-Up: At [facility], you and your health needs are our priority.  As part of our continuing mission to provide you with exceptional heart care, we have created designated Provider Care Teams.  These Care Teams include your primary Cardiologist (physician) and Advanced Practice Providers (APPs -  Physician Assistants and Nurse Practitioners) who all work together to provide you with the care you need, when you need it.  We recommend signing up for the patient portal called "MyChart".  Sign up information is provided on this After Visit Summary.  MyChart is used to connect with patients for Virtual Visits (Telemedicine).  Patients are able to view lab/test results, encounter notes, upcoming appointments, etc.  Non-urgent messages can be sent to your provider as well.   To learn more about what you can do with MyChart, go to [email or web address].    Your next appointment:   6 month(s)  The format for your next appointment:   In Person  Provider:   Sande Rives, PA-C, Caron Presume, PA-C, Jory Sims, DNP, ANP, Almyra Deforest, PA-C, or Diona Browner, NP    Then, Kirk Ruths, MD will plan to see you again in 12 month(s).

## 2022-09-01 ENCOUNTER — Telehealth: Payer: Self-pay

## 2022-09-01 NOTE — Progress Notes (Signed)
Carelink Summary Report / Loop Recorder 

## 2022-09-01 NOTE — Telephone Encounter (Signed)
EGM's located in [place]. P waves appear present in all episodes and plots do not correlate with AF. Forwarding to Dr. Quentin Ore as requested.

## 2022-09-01 NOTE — Telephone Encounter (Signed)
-----   Message from Vickie Epley, MD sent at 08/31/2022 11:12 AM EST ----- Hey, CV remote said no AF on this loop but the report lists 3 new episodes, longest 8 minutes.  I don't see any EGMs to confirm the rhythm.  Can you all see the EGM's?  Please let me know either way.  Thanks,  Lars Mage

## 2022-09-08 ENCOUNTER — Telehealth: Payer: Self-pay

## 2022-09-08 NOTE — Telephone Encounter (Signed)
CV Remote Solution Alert  ILR alert for false AF, 4-48mn in duration EGM show SR with possible intermittent heart block vs SR/SB with ectopy, route to triage for review.   Sending to Dr. LQuentin Oreto advise int. HB vs SR/SB with ectopy, patient called, reports asymptomatic and awake during this time 09/07/22 @ 15:29.

## 2022-09-09 NOTE — Telephone Encounter (Signed)
Outreach made to Pt.  Advised no afib found yet.    Pt was thankful for return call.

## 2022-09-22 ENCOUNTER — Ambulatory Visit (INDEPENDENT_AMBULATORY_CARE_PROVIDER_SITE_OTHER): Payer: Medicare Other

## 2022-09-22 DIAGNOSIS — Z8673 Personal history of transient ischemic attack (TIA), and cerebral infarction without residual deficits: Secondary | ICD-10-CM | POA: Diagnosis not present

## 2022-09-23 LAB — CUP PACEART REMOTE DEVICE CHECK
Date Time Interrogation Session: 20231127005200
Implantable Pulse Generator Implant Date: 20230309
Pulse Gen Serial Number: 172093

## 2022-09-28 ENCOUNTER — Other Ambulatory Visit: Payer: Self-pay | Admitting: Family Medicine

## 2022-10-17 ENCOUNTER — Other Ambulatory Visit: Payer: Self-pay | Admitting: Family Medicine

## 2022-10-17 DIAGNOSIS — K219 Gastro-esophageal reflux disease without esophagitis: Secondary | ICD-10-CM

## 2022-10-23 ENCOUNTER — Ambulatory Visit (INDEPENDENT_AMBULATORY_CARE_PROVIDER_SITE_OTHER): Payer: Medicare Other

## 2022-10-23 DIAGNOSIS — Z8673 Personal history of transient ischemic attack (TIA), and cerebral infarction without residual deficits: Secondary | ICD-10-CM

## 2022-10-28 ENCOUNTER — Other Ambulatory Visit: Payer: Self-pay | Admitting: Nurse Practitioner

## 2022-10-28 DIAGNOSIS — J449 Chronic obstructive pulmonary disease, unspecified: Secondary | ICD-10-CM

## 2022-10-28 LAB — CUP PACEART REMOTE DEVICE CHECK
Date Time Interrogation Session: 20240101005700
Implantable Pulse Generator Implant Date: 20230309
Pulse Gen Serial Number: 172093

## 2022-10-29 ENCOUNTER — Non-Acute Institutional Stay (INDEPENDENT_AMBULATORY_CARE_PROVIDER_SITE_OTHER): Payer: Medicare Other | Admitting: Family Medicine

## 2022-10-29 ENCOUNTER — Encounter: Payer: Self-pay | Admitting: Family Medicine

## 2022-10-29 VITALS — BP 118/78 | HR 68 | Temp 95.6°F | Ht 62.0 in | Wt 129.0 lb

## 2022-10-29 DIAGNOSIS — I10 Essential (primary) hypertension: Secondary | ICD-10-CM

## 2022-10-29 DIAGNOSIS — J449 Chronic obstructive pulmonary disease, unspecified: Secondary | ICD-10-CM | POA: Diagnosis not present

## 2022-10-29 DIAGNOSIS — E7800 Pure hypercholesterolemia, unspecified: Secondary | ICD-10-CM

## 2022-10-29 DIAGNOSIS — G459 Transient cerebral ischemic attack, unspecified: Secondary | ICD-10-CM | POA: Diagnosis not present
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
## 2022-11-03 ENCOUNTER — Other Ambulatory Visit: Payer: Self-pay

## 2022-11-03 ENCOUNTER — Telehealth: Payer: Self-pay | Admitting: Neurology

## 2022-11-03 MED ORDER — CLOTRIMAZOLE 10 MG MT TROC: 10.0000 mg | Freq: Every day | OROMUCOSAL | 1 refills | Status: DC

## 2022-11-03 NOTE — Telephone Encounter (Signed)
Pt would like a call to discuss her feeling that she does not feel she needs the f/u appointment because she has not had any symptoms.  Please call pt to discuss.

## 2022-11-03 NOTE — Telephone Encounter (Signed)
Tina Mercado with [facility] called stating that patient need clotrimazole 10 mg troche sent to pharmacy. Medication was discontinued March 2023 due to patient not using.    Medication pended and sent to Dr. Alain Honey to approve/refuse

## 2022-11-03 NOTE — Progress Notes (Signed)
[place] Loop Recorder  

## 2022-11-03 NOTE — Telephone Encounter (Signed)
Okay to FU with PCP.

## 2022-11-03 NOTE — Telephone Encounter (Signed)
LMVM for pt to return call. (LMVM for her that ok to see pcp).

## 2022-11-04 NOTE — Telephone Encounter (Signed)
I called and LMVM for pt that was returning her call about appt scheduled.  If she returns call she does not need to keep appt with [place].  She can see her pcp.  I am glad to speak to her if needed.  I did go ahead and cancel the appt.

## 2022-11-05 NOTE — Telephone Encounter (Signed)
Pt called back, the message from [place], [place] was relayed.

## 2022-11-10 NOTE — Progress Notes (Signed)
[facility] Summary Report / Loop Recorder

## 2022-11-19 ENCOUNTER — Ambulatory Visit: Payer: Medicare Other | Admitting: Adult Health

## 2022-11-24 ENCOUNTER — Ambulatory Visit: Payer: Medicare Other

## 2022-12-08 ENCOUNTER — Ambulatory Visit: Payer: Medicare Other

## 2022-12-08 DIAGNOSIS — Z8673 Personal history of transient ischemic attack (TIA), and cerebral infarction without residual deficits: Secondary | ICD-10-CM | POA: Diagnosis not present

## 2022-12-08 LAB — CUP PACEART REMOTE DEVICE CHECK
Date Time Interrogation Session: 20240211010400
Implantable Pulse Generator Implant Date: 20230309
Pulse Gen Serial Number: 172093

## 2022-12-18 ENCOUNTER — Other Ambulatory Visit: Payer: Self-pay | Admitting: Orthopedic Surgery

## 2022-12-25 ENCOUNTER — Ambulatory Visit: Payer: Medicare Other

## 2023-01-08 ENCOUNTER — Ambulatory Visit: Payer: Medicare Other

## 2023-01-08 DIAGNOSIS — Z8673 Personal history of transient ischemic attack (TIA), and cerebral infarction without residual deficits: Secondary | ICD-10-CM

## 2023-01-09 LAB — CUP PACEART REMOTE DEVICE CHECK
Date Time Interrogation Session: 20240314011000
Implantable Pulse Generator Implant Date: 20230309
Pulse Gen Serial Number: 172093

## 2023-01-12 ENCOUNTER — Other Ambulatory Visit: Payer: Self-pay | Admitting: Family Medicine

## 2023-01-21 NOTE — Progress Notes (Signed)
[place] [facility] Recorder 

## 2023-01-26 ENCOUNTER — Ambulatory Visit: Payer: Medicare Other

## 2023-01-28 DIAGNOSIS — H18512 Endothelial corneal dystrophy, left eye: Secondary | ICD-10-CM | POA: Diagnosis not present

## 2023-01-28 DIAGNOSIS — Z947 Corneal transplant status: Secondary | ICD-10-CM | POA: Diagnosis not present

## 2023-01-28 DIAGNOSIS — H353132 Nonexudative age-related macular degeneration, bilateral, intermediate dry stage: Secondary | ICD-10-CM | POA: Diagnosis not present

## 2023-01-28 DIAGNOSIS — Z961 Presence of intraocular lens: Secondary | ICD-10-CM | POA: Diagnosis not present

## 2023-01-28 DIAGNOSIS — H02401 Unspecified ptosis of right eyelid: Secondary | ICD-10-CM | POA: Diagnosis not present

## 2023-01-28 DIAGNOSIS — H2512 Age-related nuclear cataract, left eye: Secondary | ICD-10-CM | POA: Diagnosis not present

## 2023-02-04 ENCOUNTER — Encounter: Payer: Self-pay | Admitting: Family Medicine

## 2023-02-04 ENCOUNTER — Non-Acute Institutional Stay (INDEPENDENT_AMBULATORY_CARE_PROVIDER_SITE_OTHER): Payer: Medicare Other | Admitting: Family Medicine

## 2023-02-04 VITALS — BP 118/68 | HR 68 | Temp 97.4°F | Ht 62.0 in | Wt 134.0 lb

## 2023-02-04 DIAGNOSIS — B3781 Candidal esophagitis: Secondary | ICD-10-CM

## 2023-02-04 DIAGNOSIS — B370 Candidal stomatitis: Secondary | ICD-10-CM

## 2023-02-04 DIAGNOSIS — J449 Chronic obstructive pulmonary disease, unspecified: Secondary | ICD-10-CM

## 2023-02-04 DIAGNOSIS — E7800 Pure hypercholesterolemia, unspecified: Secondary | ICD-10-CM

## 2023-02-04 DIAGNOSIS — J309 Allergic rhinitis, unspecified: Secondary | ICD-10-CM | POA: Diagnosis not present

## 2023-02-04 DIAGNOSIS — K219 Gastro-esophageal reflux disease without esophagitis: Secondary | ICD-10-CM

## 2023-02-04 MED ORDER — FEXOFENADINE HCL 180 MG PO TABS: 180.0000 mg | ORAL_TABLET | Freq: Every day | ORAL | Status: DC

## 2023-02-04 MED ORDER — CLOTRIMAZOLE 10 MG MT TROC: 10.0000 mg | Freq: Every day | OROMUCOSAL | 1 refills | Status: DC
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
## 2023-02-09 ENCOUNTER — Ambulatory Visit (INDEPENDENT_AMBULATORY_CARE_PROVIDER_SITE_OTHER): Payer: Medicare Other

## 2023-02-09 DIAGNOSIS — G459 Transient cerebral ischemic attack, unspecified: Secondary | ICD-10-CM

## 2023-02-10 LAB — CUP PACEART REMOTE DEVICE CHECK
Date Time Interrogation Session: 20240415011400
Implantable Pulse Generator Implant Date: 20230309
Pulse Gen Serial Number: 172093

## 2023-02-10 NOTE — Progress Notes (Signed)
Carelink Summary Report / Loop Recorder 

## 2023-02-12 ENCOUNTER — Non-Acute Institutional Stay (INDEPENDENT_AMBULATORY_CARE_PROVIDER_SITE_OTHER): Payer: Medicare Other | Admitting: Nurse Practitioner

## 2023-02-12 ENCOUNTER — Encounter: Payer: Self-pay | Admitting: Nurse Practitioner

## 2023-02-12 VITALS — BP 122/70 | HR 60 | Temp 98.0°F | Ht 62.0 in | Wt 134.4 lb

## 2023-02-12 DIAGNOSIS — Z0000 Encounter for general adult medical examination without abnormal findings: Secondary | ICD-10-CM

## 2023-02-12 NOTE — Progress Notes (Signed)
Subjective:   Tina Mercado is a 89 y.o. female who presents for Medicare Annual (Subsequent) preventive examination in the clinic [facility].  Cardiac Risk Factors include: advanced age (>74men, >16 women);dyslipidemia     Objective:    Today's Vitals   02/12/23 1514  BP: 122/70  Pulse: 60  Temp: 98 F (36.7 C)  SpO2: 98%  Weight: 134 lb 6.4 oz (61 kg)  Height:  (1.575 m)   Body mass index is 24.58 kg/m.     02/12/2023    3:15 PM 05/04/2022    9:28 PM 12/31/2021    8:15 PM 05/14/2020    4:00 PM 04/11/2019    8:00 PM 04/10/2019    2:58 PM 03/02/2019    5:57 PM  Advanced Directives  Does Patient Have a Medical Advance Directive? Yes Yes Yes No Yes Yes No  Type of Estate agent of [place];Living will Healthcare Power of [employer] of [place];Living will  Living will;Healthcare Power of Attorney Living will;Healthcare Power of Attorney   Does patient want to make changes to medical advance directive? No - Patient declined No - Patient declined No - Patient declined  No - Patient declined    Copy of Healthcare Power of Attorney in Chart? No - copy requested No - copy requested No - copy requested  No - copy requested    Would patient like information on creating a medical advance directive?  No - Patient declined  No - Patient declined   No - Patient declined    Current Medications (verified) Outpatient Encounter Medications as of 02/12/2023  Medication Sig   acetaminophen (TYLENOL) 325 MG tablet Take 2 tablets (650 mg total) by mouth every 6 (six) hours as needed for mild pain, moderate pain or fever.   amitriptyline (ELAVIL) 10 MG tablet TAKE 1 TABLET BY MOUTH EVERYDAY AT BEDTIME   amoxicillin (AMOXIL) 500 MG capsule Take 500 mg by mouth 3 (three) times daily. For dental procedures   aspirin EC 81 MG tablet Take 1 tablet (81 mg total) by mouth daily. Swallow whole.   Cholecalciferol (VITAMIN D) 50 MCG (2000 UT) CAPS  Take 2,000 Units by mouth every evening.   clopidogrel (PLAVIX) 75 MG tablet TAKE 1 TABLET BY MOUTH EVERY DAY   clotrimazole (MYCELEX) 10 MG troche Take 1 tablet (10 mg total) by mouth 5 (five) times daily.   docusate sodium (COLACE) 100 MG capsule Take 200 mg by mouth at bedtime.   famotidine (PEPCID) 20 MG tablet TAKE 1 TABLET BY MOUTH EVERY DAY   fluticasone-salmeterol (WIXELA INHUB) 250-50 MCG/ACT AEPB 1-2 puffs q 12 hr   ketorolac (ACULAR) 0.5 % ophthalmic solution Place 1 drop into the right eye 2 (two) times daily.   rosuvastatin (CRESTOR) 40 MG tablet TAKE 1 TABLET BY MOUTH EVERY DAY   timolol (TIMOPTIC) 0.5 % ophthalmic solution INSTILL 1 DROP INTO BOTH EYES TWICE A DAY   triamcinolone 0.1% oint-Eucerin equivalent cream 1:1 mixture Apply topically 2 (two) times daily as needed. (Patient taking differently: Apply 1 Application topically 2 (two) times daily as needed for itching.)   Facility-Administered Encounter Medications as of 02/12/2023  Medication   fexofenadine (ALLEGRA) tablet 180 mg    Allergies (verified) Contrast media [iodinated contrast media]; Antihistamines, diphenhydramine-type; Celebrex [celecoxib]; Darvocet [propoxyphene n-acetaminophen]; Feldene [piroxicam]; Gabapentin; Meperidine; Norvasc [amlodipine besylate]; Other; Tea; and Iodine-131   History: Past Medical History:  Diagnosis Date   Allergy  Arrhythmia    Cataract    COPD (chronic obstructive pulmonary disease)    GERD (gastroesophageal reflux disease)    Glaucoma    Hearing loss    does not wear hearing aids   Heart murmur    History of anemia    History of diastolic dysfunction    History of seasonal allergies    Hyperlipidemia    Hypertension    Large hiatal hernia    see on thoracic spine xray   Mitral regurgitation    mild to moderate   Osteoporosis    TIA (transient ischemic attack)    Wears glasses    readers   Past Surgical History:  Procedure Laterality Date   APPENDECTOMY      BREAST SURGERY     childbirth     x 1   CORNEAL TRANSPLANT  july 2007   EYE SURGERY     INTRAMEDULLARY (IM) NAIL INTERTROCHANTERIC Left 03/03/2019   Procedure: INTRAMEDULLARY (IM) NAIL INTERTROCHANTRIC;  Surgeon: Roby Lofts, MD;  Location: [facility] OR;  Service: Orthopedics;  Laterality: Left;   LOOP RECORDER INSERTION N/A 01/02/2022   Procedure: LOOP RECORDER INSERTION;  Surgeon: Lanier Prude, MD;  Location: [facility] INVASIVE CV LAB;  Service: Cardiovascular;  Laterality: N/A;   TONSILLECTOMY AND ADENOIDECTOMY     age 72yrs   Family History  Problem Relation Age of Onset   Heart disease Mother    Emphysema Father    Emphysema Brother    COPD Brother    Social History   Socioeconomic History   Marital status: Single    Spouse name: Not on file   Number of children: Not on file   Years of education: Not on file   Highest education level: Not on file  Occupational History   Not on file  Tobacco Use   Smoking status: Never   Smokeless tobacco: Never  Vaping Use   Vaping Use: Never used  Substance and Sexual Activity   Alcohol use: No    Alcohol/week: 0.0 standard drinks of alcohol   Drug use: No   Sexual activity: Never    Birth control/protection: Abstinence, Post-menopausal  Other Topics Concern   Not on file  Social History Narrative   Right handed   Lives at [facility] in independent living    Social Determinants of Health   Financial Resource Strain: Not on file  Food Insecurity: No Food Insecurity (05/06/2022)   Hunger Vital Sign    Worried About Running Out of Food in the Last Year: Never true    Ran Out of Food in the Last Year: Never true  Transportation Needs: No Transportation Needs (05/06/2022)   PRAPARE - Administrator, Civil Service (Medical): No    Lack of Transportation (Non-Medical): No  Physical Activity: Not on file  Stress: Not on file  Social Connections: Not on file    Tobacco Counseling Counseling given: Not  Answered   Clinical Intake:  Pre-visit preparation completed: Yes  Pain : No/denies pain     BMI - recorded: 24.58 Nutritional Status: BMI of 19-24  Normal Nutritional Risks: None Diabetes: No  How often do you need to have someone help you when you read instructions, pamphlets, or other written materials from your doctor or pharmacy?: 1 - Never What is the last grade level you completed in school?: college degreee  Diabetic?no  Interpreter Needed?: No  Information entered by :: Gerrod Maule Nedra Hai NP   Activities  of Daily Living    02/12/2023    3:55 PM 02/12/2023    3:20 PM  In your present state of health, do you have any difficulty performing the following activities:  Hearing? 1 1  Comment needs hearing aids.   Vision? 1 0  Comment hx of eye surgeries.   Difficulty concentrating or making decisions? 0 0  Walking or climbing stairs? 1 1  Dressing or bathing? 0 0  Doing errands, shopping? 0 0  Preparing Food and eating ? N N  Using the Toilet? N N  In the past six months, have you accidently leaked urine? Y Y  Comment urge incontinence   Do you have problems with loss of bowel control? N N  Managing your Medications? Y N  Managing your Finances? Y N  Housekeeping or managing your Housekeeping? Y N    Patient Care Team: Frederica Kuster, MD as PCP - General (Family Medicine) Jens Som Madolyn Frieze, MD as PCP - Cardiology (Cardiology) Michele Mcalpine, MD as Consulting Physician (Pulmonary Disease) Jens Som Madolyn Frieze, MD as Consulting Physician (Cardiology) Marvis Repress, MD as Referring Physician (Ophthalmology) Holli Humbles, MD as Referring Physician (Ophthalmology) Duke, Roe Rutherford, PA as Physician Assistant (Cardiology)  Indicate any recent Medical Services you may have received from other than [facility] providers in the past year (date may be approximate).     Assessment:   This is a routine wellness examination for [place].  Hearing/Vision screen Hearing  Screening - Comments:: No hearing aids but some hearing concerns Vision Screening - Comments:: No glasses but wear readers  Dietary issues and exercise activities discussed: Current Exercise Habits: The patient does not participate in regular exercise at present, Exercise limited by: neurologic condition(s);orthopedic condition(s)   Goals Addressed             This Visit's Progress    Maintain Mobility and Function       Evidence-based guidance:  Acknowledge and validate impact of pain, loss of strength and potential disfigurement (hand osteoarthritis) on mental health and daily life, such as social isolation, anxiety, depression, impaired sexual relationship and   injury from falls.  Anticipate referral to physical or occupational therapy for assessment, therapeutic exercise and recommendation for adaptive equipment or assistive devices; encourage participation.  Assess impact on ability to perform activities of daily living, as well as engage in sports and leisure events or requirements of work or school.  Provide anticipatory guidance and reassurance about the benefit of exercise to maintain function; acknowledge and normalize fear that exercise may worsen symptoms.  Encourage regular exercise, at least 10 minutes at a time for 45 minutes per week; consider yoga, water exercise and proprioceptive exercises; encourage use of wearable activity tracker to increase motivation and adherence.  Encourage maintenance or resumption of daily activities, including employment, as pain allows and with minimal exposure to trauma.  Assist patient to advocate for adaptations to the work environment.  Consider level of pain and function, gender, age, lifestyle, patient preference, quality of life, readiness and ?ocapacity to benefit? when recommending patients for orthopaedic surgery consultation.  Explore strategies, such as changes to medication regimen or activity that enables patient to anticipate  and manage flare-ups that increase deconditioning and disability.  Explore patient preferences; encourage exposure to a broader range of activities that have been avoided for fear of experiencing pain.  Identify barriers to participation in therapy or exercise, such as pain with activity, anticipated or imagined pain.  Monitor postoperative joint replacement or  any preexisting joint replacement for ongoing pain and loss of function; provide social support and encouragement throughout recovery.   Notes:        Depression Screen    02/12/2023    3:20 PM 10/15/2018   12:33 PM 11/12/2017    3:22 PM 10/26/2017   11:57 AM 09/28/2017    3:08 PM 09/15/2017    3:54 PM 03/10/2016    2:54 PM  PHQ 2/9 Scores  PHQ - 2 Score 0 1 0 0 0 0 0    Fall Risk    02/12/2023    3:55 PM 02/12/2023    3:19 PM 02/04/2023    1:56 PM 10/29/2022    1:56 PM 08/06/2022    1:27 PM  Fall Risk   Falls in the past year? 0 0 0 0 0  Number falls in past yr:  0 0 0 0  Injury with Fall?  0 0 0 0  Risk for fall due to :  History of fall(s) No Fall Risks No Fall Risks No Fall Risks  Follow up  Falls evaluation completed Falls evaluation completed Falls evaluation completed     FALL RISK PREVENTION PERTAINING TO THE HOME:  Any stairs in or around the home? Yes  If so, are there any without handrails? No  Home free of loose throw rugs in walkways, pet beds, electrical cords, etc? No  Adequate lighting in your home to reduce risk of falls? Yes   ASSISTIVE DEVICES UTILIZED TO PREVENT FALLS:  Life alert? Yes  Use of a cane, walker or w/c? Yes  Grab bars in the bathroom? Yes  Shower chair or bench in shower? Yes  Elevated toilet seat or a handicapped toilet? Yes   TIMED UP AND GO:  Was the test performed? Yes .  Length of time to ambulate 10 feet: 12 sec.   Gait slow and steady with assistive device  Cognitive Function:    02/12/2023    3:21 PM 10/15/2018    1:17 PM  MMSE - Mini Mental State Exam   Orientation to time 5 5  Orientation to Place 5 5  Registration 3 3  Attention/ Calculation 5 5  Recall 3 3  Language- name 2 objects 2 2  Language- repeat 1 1  Language- follow 3 step command 3 3  Language- read & follow direction 1 1  Write a sentence 1 1  Copy design 1 1  Total score 30 30        10/15/2018   11:35 AM  6CIT Screen  What Year? 0 points  What month? 0 points  What time? 0 points  Count back from 20 0 points  Months in reverse 0 points  Repeat phrase 0 points  Total Score 0 points    Immunizations Immunization History  Administered Date(s) Administered   Influenza Split 07/27/2014   Influenza, High Dose Seasonal PF 08/30/2014, 10/10/2015, 08/13/2016, 07/19/2017, 10/05/2018   Influenza,inj,Quad PF,6+ Mos 08/28/2015   Moderna Sars-Covid-2 Vaccination 10/31/2019, 11/28/2019   Pneumococcal Conjugate-13 07/06/2014   Pneumococcal Polysaccharide-23 09/28/2017   Tdap 04/01/2016    TDAP status: Up to date  Flu Vaccine status: Up to date  Pneumococcal vaccine status: Up to date  Covid-19 vaccine status: Information provided on how to obtain vaccines.   Qualifies for Shingles Vaccine? No   Zostavax completed Yes   Shingrix Completed?: No.    Education has been provided regarding the importance of this vaccine. Patient has been advised to call insurance  company to determine out of pocket expense if they have not yet received this vaccine. Advised may also receive vaccine at local pharmacy or Health Dept. Verbalized acceptance and understanding.  Screening Tests Health Maintenance  Topic Date Due   Zoster Vaccines- Shingrix (1 of 2) Never done   COVID-19 Vaccine (3 - Moderna risk series) 12/26/2019   INFLUENZA VACCINE  05/28/2023   Medicare Annual Wellness (AWV)  02/12/2024   DTaP/Tdap/Td (2 - Td or Tdap) 04/01/2026   Pneumonia Vaccine 18+ Years old  Completed   DEXA SCAN  Completed   HPV VACCINES  Aged Out    Health Maintenance  Health  Maintenance Due  Topic Date Due   Zoster Vaccines- Shingrix (1 of 2) Never done   COVID-19 Vaccine (3 - Moderna risk series) 12/26/2019    Colorectal cancer screening: No longer required.   Mammogram status: No longer required due to aged out.  Bone Density status: Completed 10/28/2017. Results reflect: Bone density results: OSTEOPOROSIS. Repeat every 2 years.  Lung Cancer Screening: (Low Dose CT Chest recommended if Age 30-80 years, 30 pack-year currently smoking OR have quit w/in 15years.) does not qualify.     Additional Screening:  Hepatitis C Screening: does not qualify; Completed   Vision Screening: Recommended annual ophthalmology exams for early detection of glaucoma and other disorders of the eye. Is the patient up to date with their annual eye exam?  Yes  Who is the provider or what is the name of the office in which the patient attends annual eye exams? Dr. Valere Dross If pt is not established with a provider, would they like to be referred to a provider to establish care? No .   Dental Screening: Recommended annual dental exams for proper oral hygiene  Community Resource Referral / Chronic Care Management: CRR required this visit?  No   CCM required this visit?  No      Plan:     I have personally reviewed and noted the following in the patient's chart:   Medical and social history Use of alcohol, tobacco or illicit drugs  Current medications and supplements including opioid prescriptions. Patient is not currently taking opioid prescriptions. Functional ability and status Nutritional status Physical activity Advanced directives List of other physicians Hospitalizations, surgeries, and ER visits in previous 12 months Vitals Screenings to include cognitive, depression, and falls Referrals and appointments  In addition, I have reviewed and discussed with patient certain preventive protocols, quality metrics, and best practice recommendations. A written  personalized care plan for preventive services as well as general preventive health recommendations were provided to patient.     Nathalya Wolanski X Evelene Roussin, NP   02/12/2023

## 2023-02-19 ENCOUNTER — Other Ambulatory Visit: Payer: Self-pay | Admitting: Family Medicine

## 2023-02-19 DIAGNOSIS — K219 Gastro-esophageal reflux disease without esophagitis: Secondary | ICD-10-CM

## 2023-02-25 ENCOUNTER — Inpatient Hospital Stay (HOSPITAL_COMMUNITY)
Admission: EM | Admit: 2023-02-25 | Discharge: 2023-02-28 | DRG: 191 | Disposition: A | Discharge status: Skilled Nursing Facility | Payer: Medicare Other | Source: Skilled Nursing Facility | Attending: Internal Medicine | Admitting: Internal Medicine

## 2023-02-25 ENCOUNTER — Ambulatory Visit: Payer: Medicare Other | Admitting: Adult Health

## 2023-02-25 ENCOUNTER — Encounter (HOSPITAL_COMMUNITY): Payer: Self-pay

## 2023-02-25 ENCOUNTER — Emergency Department (HOSPITAL_COMMUNITY): Payer: Medicare Other

## 2023-02-25 ENCOUNTER — Other Ambulatory Visit: Payer: Self-pay

## 2023-02-25 DIAGNOSIS — Z7982 Long term (current) use of aspirin: Secondary | ICD-10-CM | POA: Diagnosis not present

## 2023-02-25 DIAGNOSIS — Z1152 Encounter for screening for COVID-19: Secondary | ICD-10-CM | POA: Diagnosis not present

## 2023-02-25 DIAGNOSIS — Z8249 Family history of ischemic heart disease and other diseases of the circulatory system: Secondary | ICD-10-CM

## 2023-02-25 DIAGNOSIS — J479 Bronchiectasis, uncomplicated: Secondary | ICD-10-CM | POA: Diagnosis present

## 2023-02-25 DIAGNOSIS — Z7951 Long term (current) use of inhaled steroids: Secondary | ICD-10-CM | POA: Diagnosis not present

## 2023-02-25 DIAGNOSIS — I340 Nonrheumatic mitral (valve) insufficiency: Secondary | ICD-10-CM | POA: Diagnosis present

## 2023-02-25 DIAGNOSIS — E785 Hyperlipidemia, unspecified: Secondary | ICD-10-CM | POA: Diagnosis present

## 2023-02-25 DIAGNOSIS — Z8673 Personal history of transient ischemic attack (TIA), and cerebral infarction without residual deficits: Secondary | ICD-10-CM

## 2023-02-25 DIAGNOSIS — Z886 Allergy status to analgesic agent status: Secondary | ICD-10-CM

## 2023-02-25 DIAGNOSIS — Z79899 Other long term (current) drug therapy: Secondary | ICD-10-CM

## 2023-02-25 DIAGNOSIS — Z881 Allergy status to other antibiotic agents status: Secondary | ICD-10-CM

## 2023-02-25 DIAGNOSIS — M810 Age-related osteoporosis without current pathological fracture: Secondary | ICD-10-CM | POA: Diagnosis present

## 2023-02-25 DIAGNOSIS — K828 Other specified diseases of gallbladder: Secondary | ICD-10-CM | POA: Diagnosis present

## 2023-02-25 DIAGNOSIS — R059 Cough, unspecified: Secondary | ICD-10-CM | POA: Diagnosis not present

## 2023-02-25 DIAGNOSIS — N1831 Chronic kidney disease, stage 3a: Secondary | ICD-10-CM | POA: Diagnosis not present

## 2023-02-25 DIAGNOSIS — H9190 Unspecified hearing loss, unspecified ear: Secondary | ICD-10-CM | POA: Diagnosis present

## 2023-02-25 DIAGNOSIS — Z947 Corneal transplant status: Secondary | ICD-10-CM

## 2023-02-25 DIAGNOSIS — I700 Atherosclerosis of aorta: Secondary | ICD-10-CM | POA: Diagnosis present

## 2023-02-25 DIAGNOSIS — D631 Anemia in chronic kidney disease: Secondary | ICD-10-CM | POA: Diagnosis present

## 2023-02-25 DIAGNOSIS — R0602 Shortness of breath: Secondary | ICD-10-CM | POA: Diagnosis not present

## 2023-02-25 DIAGNOSIS — Z95818 Presence of other cardiac implants and grafts: Secondary | ICD-10-CM

## 2023-02-25 DIAGNOSIS — J441 Chronic obstructive pulmonary disease with (acute) exacerbation: Principal | ICD-10-CM | POA: Diagnosis present

## 2023-02-25 DIAGNOSIS — J9811 Atelectasis: Secondary | ICD-10-CM | POA: Diagnosis not present

## 2023-02-25 DIAGNOSIS — K219 Gastro-esophageal reflux disease without esophagitis: Secondary | ICD-10-CM | POA: Diagnosis not present

## 2023-02-25 DIAGNOSIS — Z66 Do not resuscitate: Secondary | ICD-10-CM | POA: Diagnosis present

## 2023-02-25 DIAGNOSIS — Z91041 Radiographic dye allergy status: Secondary | ICD-10-CM

## 2023-02-25 DIAGNOSIS — N179 Acute kidney failure, unspecified: Secondary | ICD-10-CM | POA: Diagnosis not present

## 2023-02-25 DIAGNOSIS — K449 Diaphragmatic hernia without obstruction or gangrene: Secondary | ICD-10-CM | POA: Diagnosis not present

## 2023-02-25 DIAGNOSIS — Z885 Allergy status to narcotic agent status: Secondary | ICD-10-CM

## 2023-02-25 DIAGNOSIS — I440 Atrioventricular block, first degree: Secondary | ICD-10-CM | POA: Diagnosis present

## 2023-02-25 DIAGNOSIS — Z825 Family history of asthma and other chronic lower respiratory diseases: Secondary | ICD-10-CM

## 2023-02-25 DIAGNOSIS — I1310 Hypertensive heart and chronic kidney disease without heart failure, with stage 1 through stage 4 chronic kidney disease, or unspecified chronic kidney disease: Secondary | ICD-10-CM | POA: Diagnosis present

## 2023-02-25 DIAGNOSIS — Z882 Allergy status to sulfonamides status: Secondary | ICD-10-CM

## 2023-02-25 DIAGNOSIS — I10 Essential (primary) hypertension: Secondary | ICD-10-CM | POA: Diagnosis not present

## 2023-02-25 DIAGNOSIS — I119 Hypertensive heart disease without heart failure: Secondary | ICD-10-CM | POA: Diagnosis present

## 2023-02-25 DIAGNOSIS — J309 Allergic rhinitis, unspecified: Secondary | ICD-10-CM | POA: Diagnosis not present

## 2023-02-25 DIAGNOSIS — Z7902 Long term (current) use of antithrombotics/antiplatelets: Secondary | ICD-10-CM

## 2023-02-25 DIAGNOSIS — Z91018 Allergy to other foods: Secondary | ICD-10-CM

## 2023-02-25 DIAGNOSIS — Z888 Allergy status to other drugs, medicaments and biological substances status: Secondary | ICD-10-CM

## 2023-02-25 LAB — CBC WITH DIFFERENTIAL/PLATELET
Abs Immature Granulocytes: 0.03 10*3/uL (ref 0.00–0.07)
Basophils Absolute: 0 10*3/uL (ref 0.0–0.1)
Basophils Relative: 1 %
Eosinophils Absolute: 0.4 10*3/uL (ref 0.0–0.5)
Eosinophils Relative: 7 %
HCT: 40.6 % (ref 36.0–46.0)
Hemoglobin: 12.5 g/dL (ref 12.0–15.0)
Immature Granulocytes: 1 %
Lymphocytes Relative: 18 %
Lymphs Abs: 1.2 10*3/uL (ref 0.7–4.0)
MCH: 27.4 pg (ref 26.0–34.0)
MCHC: 30.8 g/dL (ref 30.0–36.0)
MCV: 89 fL (ref 80.0–100.0)
Monocytes Absolute: 0.7 10*3/uL (ref 0.1–1.0)
Monocytes Relative: 10 %
Neutro Abs: 4.2 10*3/uL (ref 1.7–7.7)
Neutrophils Relative %: 63 %
Platelets: 170 10*3/uL (ref 150–400)
RBC: 4.56 MIL/uL (ref 3.87–5.11)
RDW: 15.9 % — ABNORMAL HIGH (ref 11.5–15.5)
WBC: 6.5 10*3/uL (ref 4.0–10.5)
nRBC: 0 % (ref 0.0–0.2)

## 2023-02-25 LAB — BASIC METABOLIC PANEL
Anion gap: 10 (ref 5–15)
BUN: 14 mg/dL (ref 8–23)
CO2: 17 mmol/L — ABNORMAL LOW (ref 22–32)
Calcium: 8.2 mg/dL — ABNORMAL LOW (ref 8.9–10.3)
Chloride: 108 mmol/L (ref 98–111)
Creatinine, Ser: 1.46 mg/dL — ABNORMAL HIGH (ref 0.44–1.00)
GFR, Estimated: 34 mL/min — ABNORMAL LOW (ref >60)
Glucose, Bld: 119 mg/dL — ABNORMAL HIGH (ref 70–99)
Potassium: 4.4 mmol/L (ref 3.5–5.1)
Sodium: 135 mmol/L (ref 135–145)

## 2023-02-25 LAB — LACTIC ACID, PLASMA: Lactic Acid, Venous: 1.2 mmol/L (ref 0.5–1.9)

## 2023-02-25 LAB — BRAIN NATRIURETIC PEPTIDE: B Natriuretic Peptide: 58.5 pg/mL (ref 0.0–100.0)

## 2023-02-25 LAB — SARS CORONAVIRUS 2 BY RT PCR: SARS Coronavirus 2 by RT PCR: NEGATIVE

## 2023-02-25 MED ORDER — PREDNISONE 20 MG PO TABS
40.0000 mg | ORAL_TABLET | Freq: Every day | ORAL | Status: DC
  Administered 2023-02-26 – 2023-02-28 (×3): 40 mg via ORAL
  Filled 2023-02-25 (×3): qty 2

## 2023-02-25 MED ORDER — IPRATROPIUM-ALBUTEROL 0.5-2.5 (3) MG/3ML IN SOLN
3.0000 mL | Freq: Two times a day (BID) | RESPIRATORY_TRACT | Status: DC
  Administered 2023-02-26 – 2023-02-28 (×5): 3 mL via RESPIRATORY_TRACT
  Filled 2023-02-25 (×5): qty 3

## 2023-02-25 MED ORDER — ENOXAPARIN SODIUM 30 MG/0.3ML IJ SOSY
30.0000 mg | PREFILLED_SYRINGE | INTRAMUSCULAR | Status: DC
  Administered 2023-02-25 – 2023-02-27 (×3): 30 mg via SUBCUTANEOUS
  Filled 2023-02-25 (×3): qty 0.3

## 2023-02-25 MED ORDER — CLOTRIMAZOLE 10 MG MT TROC
10.0000 mg | Freq: Every day | OROMUCOSAL | Status: DC
  Administered 2023-02-25 – 2023-02-28 (×11): 10 mg via ORAL
  Filled 2023-02-25 (×15): qty 1

## 2023-02-25 MED ORDER — ASPIRIN 81 MG PO TBEC
81.0000 mg | DELAYED_RELEASE_TABLET | Freq: Every day | ORAL | Status: DC
  Administered 2023-02-25 – 2023-02-28 (×4): 81 mg via ORAL
  Filled 2023-02-25 (×4): qty 1

## 2023-02-25 MED ORDER — FAMOTIDINE 20 MG PO TABS
20.0000 mg | ORAL_TABLET | Freq: Every day | ORAL | Status: DC
  Administered 2023-02-25 – 2023-02-28 (×4): 20 mg via ORAL
  Filled 2023-02-25 (×4): qty 1

## 2023-02-25 MED ORDER — ONDANSETRON HCL 4 MG/2ML IJ SOLN: 4.0000 mg | Freq: Four times a day (QID) | INTRAMUSCULAR | Status: DC | PRN

## 2023-02-25 MED ORDER — AMITRIPTYLINE HCL 10 MG PO TABS
10.0000 mg | ORAL_TABLET | Freq: Every day | ORAL | Status: DC
  Administered 2023-02-25 – 2023-02-27 (×3): 10 mg via ORAL
  Filled 2023-02-25 (×3): qty 1

## 2023-02-25 MED ORDER — ALBUTEROL SULFATE (2.5 MG/3ML) 0.083% IN NEBU
2.5000 mg | INHALATION_SOLUTION | RESPIRATORY_TRACT | Status: DC | PRN
  Administered 2023-02-26 – 2023-02-27 (×2): 2.5 mg via RESPIRATORY_TRACT
  Filled 2023-02-25 (×2): qty 3

## 2023-02-25 MED ORDER — ACETAMINOPHEN 325 MG PO TABS
650.0000 mg | ORAL_TABLET | Freq: Four times a day (QID) | ORAL | Status: DC | PRN
  Administered 2023-02-27: 650 mg via ORAL
  Filled 2023-02-25: qty 2

## 2023-02-25 MED ORDER — IPRATROPIUM-ALBUTEROL 0.5-2.5 (3) MG/3ML IN SOLN
3.0000 mL | Freq: Four times a day (QID) | RESPIRATORY_TRACT | Status: DC
  Administered 2023-02-25: 3 mL via RESPIRATORY_TRACT
  Filled 2023-02-25: qty 3

## 2023-02-25 MED ORDER — DOCUSATE SODIUM 100 MG PO CAPS
200.0000 mg | ORAL_CAPSULE | Freq: Every day | ORAL | Status: DC
  Administered 2023-02-25 – 2023-02-27 (×3): 200 mg via ORAL
  Filled 2023-02-25 (×3): qty 2

## 2023-02-25 MED ORDER — VITAMIN D 25 MCG (1000 UNIT) PO TABS
2000.0000 [IU] | ORAL_TABLET | Freq: Every evening | ORAL | Status: DC
  Administered 2023-02-25 – 2023-02-26 (×2): 2000 [IU] via ORAL
  Filled 2023-02-25 (×3): qty 2

## 2023-02-25 MED ORDER — LACTATED RINGERS IV SOLN: INTRAVENOUS | Status: AC

## 2023-02-25 MED ORDER — LORATADINE 10 MG PO TABS
10.0000 mg | ORAL_TABLET | Freq: Every day | ORAL | Status: DC
  Administered 2023-02-25 – 2023-02-28 (×4): 10 mg via ORAL
  Filled 2023-02-25 (×4): qty 1

## 2023-02-25 MED ORDER — CLOPIDOGREL BISULFATE 75 MG PO TABS
75.0000 mg | ORAL_TABLET | Freq: Every day | ORAL | Status: DC
  Administered 2023-02-25 – 2023-02-28 (×4): 75 mg via ORAL
  Filled 2023-02-25 (×4): qty 1

## 2023-02-25 MED ORDER — ONDANSETRON HCL 4 MG PO TABS: 4.0000 mg | ORAL_TABLET | Freq: Four times a day (QID) | ORAL | Status: DC | PRN

## 2023-02-25 MED ORDER — TIMOLOL MALEATE 0.5 % OP SOLN
1.0000 [drp] | Freq: Two times a day (BID) | OPHTHALMIC | Status: DC
  Administered 2023-02-25 – 2023-02-28 (×6): 1 [drp] via OPHTHALMIC
  Filled 2023-02-25: qty 5

## 2023-02-25 MED ORDER — KETOROLAC TROMETHAMINE 0.5 % OP SOLN
1.0000 [drp] | Freq: Two times a day (BID) | OPHTHALMIC | Status: DC
  Administered 2023-02-25 – 2023-02-28 (×6): 1 [drp] via OPHTHALMIC
  Filled 2023-02-25: qty 5

## 2023-02-25 MED ORDER — ALBUTEROL SULFATE (2.5 MG/3ML) 0.083% IN NEBU
5.0000 mg | INHALATION_SOLUTION | Freq: Once | RESPIRATORY_TRACT | Status: AC
  Administered 2023-02-25: 5 mg via RESPIRATORY_TRACT
  Filled 2023-02-25: qty 6

## 2023-02-25 MED ORDER — MOMETASONE FURO-FORMOTEROL FUM 200-5 MCG/ACT IN AERO
2.0000 | INHALATION_SPRAY | Freq: Two times a day (BID) | RESPIRATORY_TRACT | Status: DC
  Administered 2023-02-25 – 2023-02-28 (×6): 2 via RESPIRATORY_TRACT
  Filled 2023-02-25: qty 8.8

## 2023-02-25 MED ORDER — LACTATED RINGERS IV SOLN: INTRAVENOUS | Status: DC

## 2023-02-25 MED ORDER — ROSUVASTATIN CALCIUM 20 MG PO TABS
40.0000 mg | ORAL_TABLET | Freq: Every day | ORAL | Status: DC
  Administered 2023-02-25 – 2023-02-28 (×4): 40 mg via ORAL
  Filled 2023-02-25 (×4): qty 2

## 2023-02-25 NOTE — Assessment & Plan Note (Signed)
Continue aspirin, Plavix and statins 

## 2023-02-25 NOTE — ED Provider Notes (Signed)
[facility] EMERGENCY DEPARTMENT AT [facility] Provider Note   CSN: [id number] Arrival date & time: 02/25/23  0725     History  Chief Complaint  Patient presents with   Shortness of Breath    Tina Mercado is a 89 y.o. female.  89 year old female with history of COPD presents with 1 week history of shortness of breath.  Denies any fever or chills.  No nausea or vomiting.  States cough has been nonproductive.  Does note some increased dyspnea on exertion.  Called her doctor and she is scheduled to have an appointment in the future.  Called EMS To be wheezing.  Given albuterol Medrol.  States her symptoms are slightly better.  Denies any cardiac symptoms       Home Medications Prior to Admission medications   Medication Sig Start Date End Date Taking? Authorizing Provider  acetaminophen (TYLENOL) 325 MG tablet Take 2 tablets (650 mg total) by mouth every 6 (six) hours as needed for mild pain, moderate pain or fever. 05/16/20   Dolan Amen, MD  amitriptyline (ELAVIL) 10 MG tablet TAKE 1 TABLET BY MOUTH EVERYDAY AT BEDTIME 06/24/22   Frederica Kuster, MD  amoxicillin (AMOXIL) 500 MG capsule Take 500 mg by mouth 3 (three) times daily. For dental procedures 06/17/22   [provider]  aspirin EC 81 MG tablet Take 1 tablet (81 mg total) by mouth daily. Swallow whole. 05/06/22   Almon Hercules, MD  Cholecalciferol (VITAMIN D) 50 MCG (2000 UT) CAPS Take 2,000 Units by mouth every evening.    [provider]  clopidogrel (PLAVIX) 75 MG tablet TAKE 1 TABLET BY MOUTH EVERY DAY 12/18/22   Frederica Kuster, MD  clotrimazole (MYCELEX) 10 MG troche Take 1 tablet (10 mg total) by mouth 5 (five) times daily. 02/04/23   Frederica Kuster, MD  docusate sodium (COLACE) 100 MG capsule Take 200 mg by mouth at bedtime.    [provider]  famotidine (PEPCID) 20 MG tablet TAKE 1 TABLET BY MOUTH EVERY DAY 02/19/23   Frederica Kuster, MD  fluticasone-salmeterol  [facility] INHUB) 250-50 MCG/ACT AEPB 1-2 puffs q 12 hr 10/29/22   Frederica Kuster, MD  ketorolac (ACULAR) 0.5 % ophthalmic solution Place 1 drop into the right eye 2 (two) times daily. 06/21/20   Mast, Man X, NP  rosuvastatin (CRESTOR) 40 MG tablet TAKE 1 TABLET BY MOUTH EVERY DAY 01/13/23   Frederica Kuster, MD  timolol (TIMOPTIC) 0.5 % ophthalmic solution INSTILL 1 DROP INTO BOTH EYES TWICE A DAY 07/23/22   Frederica Kuster, MD  triamcinolone 0.1% oint-Eucerin equivalent cream 1:1 mixture Apply topically 2 (two) times daily as needed. Patient taking differently: Apply 1 Application topically 2 (two) times daily as needed for itching. 02/28/22   Duke, Roe Rutherford, PA      Allergies    Contrast media [iodinated contrast media]; Antihistamines, diphenhydramine-type; Celecoxib; Clemastine fumarate; Darvocet [propoxyphene n-acetaminophen]; Diphenhydramine hcl; Feldene [piroxicam]; Gabapentin; Meperidine; Norvasc [amlodipine besylate]; Other; Sulfa antibiotics; Tea; and Iodine-131    Review of Systems   Review of Systems  All other systems reviewed and are negative.   Physical Exam Updated Vital Signs Wt 60 kg   LMP  (LMP Unknown)   BMI 24.19 kg/m  Physical Exam Vitals and nursing note reviewed.  Constitutional:      General: She is not in acute distress.    Appearance: Normal appearance. She is well-developed. She is not toxic-appearing.  HENT:  Head: Normocephalic and atraumatic.  Eyes:     General: Lids are normal.     Conjunctiva/sclera: Conjunctivae normal.     Pupils: Pupils are equal, round, and reactive to light.  Neck:     Thyroid: No thyroid mass.     Trachea: No tracheal deviation.  Cardiovascular:     Rate and Rhythm: Normal rate and regular rhythm.     Heart sounds: Normal heart sounds. No murmur heard.    No gallop.  Pulmonary:     Effort: Pulmonary effort is normal. No respiratory distress.     Breath sounds: No stridor. Examination of the right-lower field  reveals decreased breath sounds and wheezing. Examination of the left-lower field reveals decreased breath sounds and wheezing. Decreased breath sounds and wheezing present. No rhonchi or rales.  Abdominal:     General: There is no distension.     Palpations: Abdomen is soft.     Tenderness: There is no abdominal tenderness. There is no rebound.  Musculoskeletal:        General: No tenderness. Normal range of motion.     Cervical back: Normal range of motion and neck supple.  Skin:    General: Skin is warm and dry.     Findings: No abrasion or rash.  Neurological:     Mental Status: She is alert and oriented to person, place, and time. Mental status is at baseline.     GCS: GCS eye subscore is 4. GCS verbal subscore is 5. GCS motor subscore is 6.     Cranial Nerves: No cranial nerve deficit.     Sensory: No sensory deficit.     Motor: Motor function is intact.  Psychiatric:        Attention and Perception: Attention normal.        Speech: Speech normal.        Behavior: Behavior normal.     ED Results / Procedures / Treatments   Labs (all labs ordered are listed, but only abnormal results are displayed) Labs Reviewed  SARS CORONAVIRUS 2 BY RT PCR  CULTURE, BLOOD (ROUTINE X 2)  CULTURE, BLOOD (ROUTINE X 2)  CBC WITH DIFFERENTIAL/PLATELET  BASIC METABOLIC PANEL  BRAIN NATRIURETIC PEPTIDE  LACTIC ACID, PLASMA  LACTIC ACID, PLASMA    EKG EKG Interpretation  Date/Time:  Wednesday Feb 25 2023 07:47:48 EDT Ventricular Rate:  88 PR Interval:  228 QRS Duration: 78 QT Interval:  355 QTC Calculation: 430 R Axis:   1 Text Interpretation: Sinus rhythm Prolonged PR interval Low voltage, precordial leads Confirmed by Lorre Nick ([id number]) on 02/25/2023 8:03:51 AM  Radiology No results found.  Procedures Procedures    Medications Ordered in ED Medications  lactated ringers infusion (has no administration in time range)    ED Course/ Medical Decision Making/ A&P                              Medical Decision Making Amount and/or Complexity of Data Reviewed Labs: ordered. Radiology: ordered. ECG/medicine tests: ordered.  Risk Prescription drug management.   Patient's EKG per my interpretation shows normal sinus rhythm.  No signs of acute ischemic changes noted.  Patient's chest x-ray interpretation shows no acute findings.  Patient had wheezes on exam and she was given albuterol with some improvement.  Continues to note being short of breath.  Chest CT performed which was negative for infiltrate.  Suspect he is having COPD exacerbation.  She  already received Solu-Medrol.  She will require admission.  Will consult hospitalist        Final Clinical Impression(s) / ED Diagnoses Final diagnoses:  None    Rx / DC Orders ED Discharge Orders     None         Lorre Nick, MD 02/25/23 1038

## 2023-02-25 NOTE — Assessment & Plan Note (Signed)
Blood pressure is stable Monitor closely during this hospitalization 

## 2023-02-25 NOTE — Progress Notes (Signed)
Mobility Specialist - Progress Note   02/25/23 1356  Mobility  Activity Ambulated with assistance in hallway  Level of Assistance Standby assist, set-up cues, supervision of patient - no hands on  Assistive Device Front wheel walker  Distance Ambulated (ft) 200 ft  Activity Response Tolerated well  Mobility Referral Yes  $Mobility charge 1 Mobility   Pt received in bed and agreed to mobility. Pt was standby for bed mobility. Pt was standby for mobility, pt returned to chair with all needs met and alarm on.  Marilynne Halsted Mobility Specialist
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
## 2023-02-25 NOTE — ED Triage Notes (Addendum)
BIBA from home c/o non productive cough with increased sob x1 week.  Worse with activity.  Rhonchi noted to LLL.  Ems admin duo-neb, 125mg  soul-medrol, and NS.  Hx COPD.

## 2023-02-25 NOTE — Assessment & Plan Note (Signed)
Stable.  Continue PPI. 

## 2023-02-25 NOTE — Assessment & Plan Note (Signed)
Patient with a history of COPD who presents to the ER for evaluation of worsening shortness of breath from her baseline with exertion associated with a nonproductive cough and wheezing. Place patient on scheduled and as needed bronchodilator therapy Place patient on inhaled and systemic steroids Will assess for home oxygen need prior to discharge

## 2023-02-25 NOTE — Assessment & Plan Note (Signed)
Renal function is stable Monitor closely during this hospitalization  

## 2023-02-25 NOTE — ED Notes (Signed)
Patient sleeping at this time.  No distress noted.

## 2023-02-26 ENCOUNTER — Ambulatory Visit: Payer: Medicare Other

## 2023-02-26 DIAGNOSIS — J309 Allergic rhinitis, unspecified: Secondary | ICD-10-CM | POA: Diagnosis not present

## 2023-02-26 DIAGNOSIS — N1831 Chronic kidney disease, stage 3a: Secondary | ICD-10-CM | POA: Diagnosis not present

## 2023-02-26 DIAGNOSIS — J441 Chronic obstructive pulmonary disease with (acute) exacerbation: Secondary | ICD-10-CM | POA: Diagnosis not present

## 2023-02-26 LAB — CBC
HCT: 32.1 % — ABNORMAL LOW (ref 36.0–46.0)
Hemoglobin: 10.2 g/dL — ABNORMAL LOW (ref 12.0–15.0)
MCH: 27.7 pg (ref 26.0–34.0)
MCHC: 31.8 g/dL (ref 30.0–36.0)
MCV: 87.2 fL (ref 80.0–100.0)
Platelets: 146 10*3/uL — ABNORMAL LOW (ref 150–400)
RBC: 3.68 MIL/uL — ABNORMAL LOW (ref 3.87–5.11)
RDW: 15.8 % — ABNORMAL HIGH (ref 11.5–15.5)
WBC: 4.1 10*3/uL (ref 4.0–10.5)
nRBC: 0 % (ref 0.0–0.2)

## 2023-02-26 LAB — BASIC METABOLIC PANEL
Anion gap: 9 (ref 5–15)
BUN: 19 mg/dL (ref 8–23)
CO2: 19 mmol/L — ABNORMAL LOW (ref 22–32)
Calcium: 8.7 mg/dL — ABNORMAL LOW (ref 8.9–10.3)
Chloride: 112 mmol/L — ABNORMAL HIGH (ref 98–111)
Creatinine, Ser: 1.23 mg/dL — ABNORMAL HIGH (ref 0.44–1.00)
GFR, Estimated: 41 mL/min — ABNORMAL LOW (ref >60)
Glucose, Bld: 110 mg/dL — ABNORMAL HIGH (ref 70–99)
Potassium: 3.6 mmol/L (ref 3.5–5.1)
Sodium: 140 mmol/L (ref 135–145)

## 2023-02-26 MED ORDER — VITAMIN D3 25 MCG (1000 UNIT) PO TABS
2000.0000 [IU] | ORAL_TABLET | Freq: Every day | ORAL | Status: DC
  Administered 2023-02-26 – 2023-02-27 (×2): 2000 [IU] via ORAL
  Filled 2023-02-26 (×2): qty 2

## 2023-02-26 NOTE — TOC Progression Note (Signed)
Transition of Care [facility]) - Progression Note    Patient Details  Name: Tina Mercado MRN: [id number] Date of Birth: [birth year]  Transition of Care [facility]) CM/SW Contact  Coralyn Helling, [place] Phone Number: 02/26/2023, 9:55 AM  Clinical Narrative:    Transition of Care (TOC) Screening Note   Patient Details  Name: [name] Date of Birth: 11-30-1928   Transition of Care [facility]) CM/SW Contact:    Coralyn Helling, LCSW Phone Number: 02/26/2023, 9:55 AM    Transition of Care Department [facility]) has reviewed patient and no TOC needs have been identified at this time. We will continue to monitor patient advancement through interdisciplinary progression rounds. If new patient transition needs arise, please place a TOC consult.           Expected Discharge Plan and Services                                               Social Determinants of Health (SDOH) Interventions SDOH Screenings   Food Insecurity: No Food Insecurity (02/25/2023)  Housing: Low Risk  (02/25/2023)  Transportation Needs: No Transportation Needs (02/25/2023)  Utilities: Not At Risk (02/25/2023)  Depression (PHQ2-9): Low Risk  (02/12/2023)  Tobacco Use: Low Risk  (02/25/2023)    Readmission Risk Interventions     No data to display

## 2023-02-26 NOTE — Progress Notes (Signed)
[facility] PROGRESS NOTE    Progress Note  Tina Mercado  ZOX:[id number] DOB: 12-29-1928 DOA: 02/25/2023 PCP: Frederica Kuster, MD     Brief Narrative:   Tina Mercado is an 89 y.o. female past medical history significant for COPD, anemia of chronic disease, essential hypertension, history of TIA, chronic kidney disease stage III AA who is hearing impaired comes in for shortness of breath that started 1 week prior to admission was found to be in COPD exacerbation started on inhalers antibiotics and steroids.   Assessment/Plan:   COPD with acute exacerbation (HCC) Started empirically on IV steroids antibiotics and inhalers. Saturations currently greater than 90% on room air Ambulating check saturations with ambulation. She has remained afebrile,, physical examination she is wheezing bilaterally and you could tell that she has difficulty breathing.  Acute kidney injury on chronic kidney disease stage IIIa: With a baseline creatinine around 1 on admission 1.5, started on IV fluids creatinine trending down slowly. KVO IV fluids.  History of CVA: Continue aspirin and Plavix and statins.  Benign essential hypertension: Blood pressure stable resume home meds.  Hiatal hernia/GERD: Continue PPI.  DVT prophylaxis: lovenox Family Communication:none Status is: Inpatient Remains inpatient appropriate because: Respiratory failure    Code Status:     Code Status Orders  (From admission, onward)           Start     Ordered   02/25/23 1132  Full code  Continuous       Question:  By:  Answer:  Consent: discussion documented in EHR   02/25/23 1133           Code Status History     Date Active Date Inactive Code Status Order ID Comments User Context   05/04/2022 2341 05/05/2022 2005 Full Code [id number]  Briscoe Deutscher, MD ED   12/31/2021 1546 01/02/2022 2155 Full Code [id number]  Bobette Mo, MD ED   05/10/2020 1557 05/17/2020 0248 Full Code [id number]   Yvette Rack, MD ED   04/11/2019 0050 04/14/2019 2050 Full Code [id number]  Rometta Emery, MD ED   03/02/2019 2138 03/07/2019 1739 Full Code [id number]  Opyd, Lavone Neri, MD ED      Advance Directive Documentation    Flowsheet Row Most Recent Value  Type of Advance Directive Healthcare Power of Attorney, Living will  Pre-existing out of facility DNR order (yellow form or pink MOST form) --  "MOST" Form in Place? --         IV Access:   Peripheral IV   Procedures and diagnostic studies:   CT Chest Wo Contrast  Result Date: 02/25/2023 CLINICAL DATA:  Cough and shortness of breath for several days EXAM: CT CHEST WITHOUT CONTRAST TECHNIQUE: Multidetector CT imaging of the chest was performed following the standard protocol without IV contrast. RADIATION DOSE REDUCTION: This exam was performed according to the departmental dose-optimization program which includes automated exposure control, adjustment of the mA and/or kV according to patient size and/or use of iterative reconstruction technique. COMPARISON:  X-ray earlier 02/25/2023 FINDINGS: Cardiovascular: On this non IV contrast exam, the heart nonenlarged. Trace pericardial fluid and thickening. Prominent epicardial fat. Scattered thoracic aortic calcified plaque. Mediastinum/Nodes: On this non IV contrast exam there is no specific abnormal lymph node enlargement seen in the axillary region or hilum. There are several small less than 1 cm in size in short axis lymph node seen in the mediastinum, not pathologic by size criteria. Large hiatal hernia. Lungs/Pleura: Bilateral  apical pleural thickening and calcifications. There is some linear opacity along the lung bases likely scar or atelectasis particularly along the left side. Areas of bronchiectasis are identified no consolidation, pneumothorax or effusion. There is a calcified upper lobe lung nodule consistent with old granulomatous disease on series 7 image 50. Upper Abdomen: Adrenal glands  are preserved in the visualized upper abdomen. Gallbladder is distended with some likely dependent stones. Splenic granuloma. Musculoskeletal: Osteopenia.  Scattered degenerative changes. IMPRESSION: Large hiatal hernia. No consolidation or effusion. Scattered areas of bronchiectasis.  Basilar atelectasis. Evidence of old granulomatous disease Distended gallbladder with likely dependent stones. Follow up ultrasound as clinically appropriate Aortic Atherosclerosis (ICD10-I70.0). Electronically Signed   By: Karen Kays M.D.   On: 02/25/2023 10:01   DG Chest Port 1 View  Result Date: 02/25/2023 CLINICAL DATA:  Shortness of breath.  Cough. EXAM: PORTABLE CHEST 1 VIEW COMPARISON:  April 14, 2019. FINDINGS: The heart size and mediastinal contours are within normal limits. Both lungs are clear. Probable large hiatal hernia is noted. The visualized skeletal structures are unremarkable. IMPRESSION: No active disease.  Probable large hiatal hernia. Electronically Signed   By: Lupita Raider M.D.   On: 02/25/2023 07:57     Medical Consultants:   None.   Subjective:    Tina Mercado she relates her breathing is not improved.  Objective:    Vitals:   02/25/23 1944 02/26/23 0100 02/26/23 0314 02/26/23 0447  BP: (!) 166/62 (!) 179/66  (!) 153/61  Pulse: 78 61  (!) 58  Resp: 18 19  19   Temp: 98.1 F (36.7 C) 98.4 F (36.9 C)  98.2 F (36.8 C)  TempSrc:      SpO2: 94% 98% 94% 94%  Weight:      Height:       SpO2: 94 %  No intake or output data in the 24 hours ending 02/26/23 0720 Filed Weights   02/25/23 0733 02/25/23 1239  Weight: 60 kg 58.1 kg    Exam: General exam: In no acute distress. Respiratory system: Good air movement and wheezing bilaterally Cardiovascular system: S1 & S2 heard, RRR. No JVD.s.  Gastrointestinal system: Abdomen is nondistended, soft and nontender.  Extremities: No pedal edema. Skin: No rashes, lesions or ulcers Psychiatry: Judgement and insight appear  normal. Mood & affect appropriate.    Data Reviewed:    Labs: Basic Metabolic Panel: Recent Labs  Lab 02/25/23 0735 02/26/23 0403  NA 135 140  K 4.4 3.6  CL 108 112*  CO2 17* 19*  GLUCOSE 119* 110*  BUN 14 19  CREATININE 1.46* 1.23*  CALCIUM 8.2* 8.7*   GFR Estimated Creatinine Clearance: 23.1 mL/min (A) (by C-G formula based on SCr of 1.23 mg/dL (H)). Liver Function Tests: No results for input(s): "AST", "ALT", "ALKPHOS", "BILITOT", "PROT", "ALBUMIN" in the last 168 hours. No results for input(s): "LIPASE", "AMYLASE" in the last 168 hours. No results for input(s): "AMMONIA" in the last 168 hours. Coagulation profile No results for input(s): "INR", "PROTIME" in the last 168 hours. COVID-19 Labs  No results for input(s): "DDIMER", "FERRITIN", "LDH", "CRP" in the last 72 hours.  Lab Results  Component Value Date   SARSCOV2NAA NEGATIVE 02/25/2023   SARSCOV2NAA NEGATIVE 05/04/2022   SARSCOV2NAA NEGATIVE 12/31/2021   SARSCOV2NAA NEGATIVE 05/15/2020    CBC: Recent Labs  Lab 02/25/23 0735 02/26/23 0403  WBC 6.5 4.1  NEUTROABS 4.2  --   HGB 12.5 10.2*  HCT 40.6 32.1*  MCV 89.0 87.2  PLT 170 146*   Cardiac Enzymes: No results for input(s): "CKTOTAL", "CKMB", "CKMBINDEX", "TROPONINI" in the last 168 hours. BNP (last 3 results) No results for input(s): "PROBNP" in the last 8760 hours. CBG: No results for input(s): "GLUCAP" in the last 168 hours. D-Dimer: No results for input(s): "DDIMER" in the last 72 hours. Hgb A1c: No results for input(s): "HGBA1C" in the last 72 hours. Lipid Profile: No results for input(s): "CHOL", "HDL", "LDLCALC", "TRIG", "CHOLHDL", "LDLDIRECT" in the last 72 hours. Thyroid function studies: No results for input(s): "TSH", "T4TOTAL", "T3FREE", "THYROIDAB" in the last 72 hours.  Invalid input(s): "FREET3" Anemia work up: No results for input(s): "VITAMINB12", "FOLATE", "FERRITIN", "TIBC", "IRON", "RETICCTPCT" in the last 72  hours. Sepsis Labs: Recent Labs  Lab 02/25/23 0735 02/25/23 0744 02/26/23 0403  WBC 6.5  --  4.1  LATICACIDVEN  --  1.2  --    Microbiology Recent Results (from the past 240 hour(s))  SARS Coronavirus 2 by RT PCR (hospital order, performed in [facility] hospital lab) *cepheid single result test* Anterior Nasal Swab     Status: None   Collection Time: 02/25/23  7:35 AM   Specimen: Anterior Nasal Swab  Result Value Ref Range Status   SARS Coronavirus 2 by RT PCR NEGATIVE NEGATIVE Final    Comment: (NOTE) SARS-CoV-2 target nucleic acids are NOT DETECTED.  The SARS-CoV-2 RNA is generally detectable in upper and lower respiratory specimens during the acute phase of infection. The lowest concentration of SARS-CoV-2 viral copies this assay can detect is 250 copies / mL. A negative result does not preclude SARS-CoV-2 infection and should not be used as the sole basis for treatment or other patient management decisions.  A negative result may occur with improper specimen collection / handling, submission of specimen other than nasopharyngeal swab, presence of viral mutation(s) within the areas targeted by this assay, and inadequate number of viral copies (<250 copies / mL). A negative result must be combined with clinical observations, patient history, and epidemiological information.  Fact Sheet for Patients:   RoadLapTop.co.za  Fact Sheet for Healthcare Providers: http://kim-miller.com/  This test is not yet approved or  cleared by the [place] FDA and has been authorized for detection and/or diagnosis of SARS-CoV-2 by FDA under an Emergency Use Authorization (EUA).  This EUA will remain in effect (meaning this test can be used) for the duration of the COVID-19 declaration under Section 564(b)(1) of the Act, 21 U.S.C. section 360bbb-3(b)(1), unless the authorization is terminated or revoked sooner.  Performed at [facility], [address], [place], [place] [address]      Medications:    amitriptyline  10 mg Oral QHS   aspirin EC  81 mg Oral Daily   cholecalciferol  2,000 Units Oral QPM   clopidogrel  75 mg Oral Daily   clotrimazole  10 mg Oral 5 X Daily   docusate sodium  200 mg Oral QHS   enoxaparin (LOVENOX) injection  30 mg Subcutaneous Q24H   famotidine  20 mg Oral Daily   ipratropium-albuterol  3 mL Nebulization BID   ketorolac  1 drop Right Eye BID   loratadine  10 mg Oral Daily   mometasone-formoterol  2 puff Inhalation BID   predniSONE  40 mg Oral Q breakfast   rosuvastatin  40 mg Oral Daily   timolol  1 drop Both Eyes BID   Continuous Infusions:    LOS: 1 day   Marinda Elk  [facility]  02/26/2023, 7:20 AM

## 2023-02-26 NOTE — Plan of Care (Signed)
  Problem: Education: Goal: Knowledge of disease or condition will improve Outcome: Progressing   Problem: Activity: Goal: Ability to tolerate increased activity will improve Outcome: Progressing   Problem: Respiratory: Goal: Ability to maintain a clear airway will improve Outcome: Progressing   Problem: Education: Goal: Knowledge of General Education information will improve Description: Including pain rating scale, medication(s)/side effects and non-pharmacologic comfort measures Outcome: Progressing   Problem: Health Behavior/Discharge Planning: Goal: Ability to manage health-related needs will improve Outcome: Progressing   Problem: Clinical Measurements: Goal: Ability to maintain clinical measurements within normal limits will improve Outcome: Progressing   Problem: Activity: Goal: Risk for activity intolerance will decrease Outcome: Progressing   Problem: Nutrition: Goal: Adequate nutrition will be maintained Outcome: Progressing   Problem: Coping: Goal: Level of anxiety will decrease Outcome: Progressing   Problem: Pain Managment: Goal: General experience of comfort will improve Outcome: Progressing   Problem: Safety: Goal: Ability to remain free from injury will improve Outcome: Progressing   Problem: Skin Integrity: Goal: Risk for impaired skin integrity will decrease Outcome: Progressing

## 2023-02-27 DIAGNOSIS — J309 Allergic rhinitis, unspecified: Secondary | ICD-10-CM | POA: Diagnosis not present

## 2023-02-27 DIAGNOSIS — N1831 Chronic kidney disease, stage 3a: Secondary | ICD-10-CM | POA: Diagnosis not present

## 2023-02-27 DIAGNOSIS — J441 Chronic obstructive pulmonary disease with (acute) exacerbation: Secondary | ICD-10-CM | POA: Diagnosis not present

## 2023-02-27 LAB — CULTURE, BLOOD (ROUTINE X 2): Special Requests: ADEQUATE

## 2023-02-27 NOTE — Progress Notes (Signed)
[facility] PROGRESS NOTE    Progress Note  Tina Mercado  WGN:[id number] DOB: 04-17-1929 DOA: 02/25/2023 PCP: Frederica Kuster, MD     Brief Narrative:   Tina Mercado is an 89 y.o. female past medical history significant for COPD, anemia of chronic disease, essential hypertension, history of TIA, chronic kidney disease stage III AA who is hearing impaired comes in for shortness of breath that started 1 week prior to admission was found to be in COPD exacerbation started on inhalers antibiotics and steroids.   Assessment/Plan:   COPD with acute exacerbation (HCC) Started empirically on IV steroids antibiotics and inhalers. Saturations currently greater than 90% on room air Ambulating check saturations with ambulation Wheezing has resolved she relates her breathing is now better. Continues to have persistent cough no appetite.  Acute kidney injury on chronic kidney disease stage IIIa: With a baseline creatinine around 1 on admission 1.5, started on IV fluids creatinine trending down slowly. KVO IV fluids.  History of CVA: Continue aspirin and Plavix and statins.  Benign essential hypertension: Blood pressure stable resume home meds.  Hiatal hernia/GERD: Continue PPI.  DVT prophylaxis: lovenox Family Communication:none Status is: Inpatient Remains inpatient appropriate because: Respiratory failure    Code Status:     Code Status Orders  (From admission, onward)           Start     Ordered   02/25/23 1132  Full code  Continuous       Question:  By:  Answer:  Consent: discussion documented in EHR   02/25/23 1133           Code Status History     Date Active Date Inactive Code Status Order ID Comments User Context   05/04/2022 2341 05/05/2022 2005 Full Code [id number]  Briscoe Deutscher, MD ED   12/31/2021 1546 01/02/2022 2155 Full Code [id number]  Bobette Mo, MD ED   05/10/2020 1557 05/17/2020 0248 Full Code [id number]  Yvette Rack, MD ED    04/11/2019 0050 04/14/2019 2050 Full Code [id number]  Rometta Emery, MD ED   03/02/2019 2138 03/07/2019 1739 Full Code [id number]  OpydLavone Neri, MD ED      Advance Directive Documentation    Flowsheet Row Most Recent Value  Type of Advance Directive Healthcare Power of Attorney, Living will  Pre-existing out of facility DNR order (yellow form or pink MOST form) --  "MOST" Form in Place? --         IV Access:   Peripheral IV   Procedures and diagnostic studies:   No results found.   Medical Consultants:   None.   Subjective:    Tina Mercado relates her breathing is not improved.  Objective:    Vitals:   02/26/23 0900 02/26/23 1227 02/26/23 1903 02/27/23 0534  BP:  (!) 159/84 (!) 152/62 (!) 118/56  Pulse:  69 65 68  Resp:  16 17 17   Temp:  97.8 F (36.6 C) 98 F (36.7 C) 99.7 F (37.6 C)  TempSrc:  Oral Oral Oral  SpO2: 94% 93% 95% 90%  Weight:      Height:       SpO2: 90 %   Intake/Output Summary (Last 24 hours) at 02/27/2023 1130 Last data filed at 02/26/2023 2202 Gross per 24 hour  Intake 120 ml  Output --  Net 120 ml   Filed Weights   02/25/23 0733 02/25/23 1239  Weight: 60 kg 58.1 kg  Exam: General exam: In no acute distress. Respiratory system: Good air movement and clear to auscultation. Cardiovascular system: S1 & S2 heard, RRR. No JVD. Gastrointestinal system: Abdomen is nondistended, soft and nontender.  Extremities: No pedal edema. Skin: No rashes, lesions or ulcers Psychiatry: Judgement and insight appear normal. Mood & affect appropriate.   Data Reviewed:    Labs: Basic Metabolic Panel: Recent Labs  Lab 02/25/23 0735 02/26/23 0403  NA 135 140  K 4.4 3.6  CL 108 112*  CO2 17* 19*  GLUCOSE 119* 110*  BUN 14 19  CREATININE 1.46* 1.23*  CALCIUM 8.2* 8.7*    GFR Estimated Creatinine Clearance: 23.1 mL/min (A) (by C-G formula based on SCr of 1.23 mg/dL (H)). Liver Function Tests: No results for input(s):  "AST", "ALT", "ALKPHOS", "BILITOT", "PROT", "ALBUMIN" in the last 168 hours. No results for input(s): "LIPASE", "AMYLASE" in the last 168 hours. No results for input(s): "AMMONIA" in the last 168 hours. Coagulation profile No results for input(s): "INR", "PROTIME" in the last 168 hours. COVID-19 Labs  No results for input(s): "DDIMER", "FERRITIN", "LDH", "CRP" in the last 72 hours.  Lab Results  Component Value Date   SARSCOV2NAA NEGATIVE 02/25/2023   SARSCOV2NAA NEGATIVE 05/04/2022   SARSCOV2NAA NEGATIVE 12/31/2021   SARSCOV2NAA NEGATIVE 05/15/2020    CBC: Recent Labs  Lab 02/25/23 0735 02/26/23 0403  WBC 6.5 4.1  NEUTROABS 4.2  --   HGB 12.5 10.2*  HCT 40.6 32.1*  MCV 89.0 87.2  PLT 170 146*    Cardiac Enzymes: No results for input(s): "CKTOTAL", "CKMB", "CKMBINDEX", "TROPONINI" in the last 168 hours. BNP (last 3 results) No results for input(s): "PROBNP" in the last 8760 hours. CBG: No results for input(s): "GLUCAP" in the last 168 hours. D-Dimer: No results for input(s): "DDIMER" in the last 72 hours. Hgb A1c: No results for input(s): "HGBA1C" in the last 72 hours. Lipid Profile: No results for input(s): "CHOL", "HDL", "LDLCALC", "TRIG", "CHOLHDL", "LDLDIRECT" in the last 72 hours. Thyroid function studies: No results for input(s): "TSH", "T4TOTAL", "T3FREE", "THYROIDAB" in the last 72 hours.  Invalid input(s): "FREET3" Anemia work up: No results for input(s): "VITAMINB12", "FOLATE", "FERRITIN", "TIBC", "IRON", "RETICCTPCT" in the last 72 hours. Sepsis Labs: Recent Labs  Lab 02/25/23 0735 02/25/23 0744 02/26/23 0403  WBC 6.5  --  4.1  LATICACIDVEN  --  1.2  --     Microbiology Recent Results (from the past 240 hour(s))  SARS Coronavirus 2 by RT PCR (hospital order, performed in [facility] hospital lab) *cepheid single result test* Anterior Nasal Swab     Status: None   Collection Time: 02/25/23  7:35 AM   Specimen: Anterior Nasal Swab  Result Value  Ref Range Status   SARS Coronavirus 2 by RT PCR NEGATIVE NEGATIVE Final    Comment: (NOTE) SARS-CoV-2 target nucleic acids are NOT DETECTED.  The SARS-CoV-2 RNA is generally detectable in upper and lower respiratory specimens during the acute phase of infection. The lowest concentration of SARS-CoV-2 viral copies this assay can detect is 250 copies / mL. A negative result does not preclude SARS-CoV-2 infection and should not be used as the sole basis for treatment or other patient management decisions.  A negative result may occur with improper specimen collection / handling, submission of specimen other than nasopharyngeal swab, presence of viral mutation(s) within the areas targeted by this assay, and inadequate number of viral copies (<250 copies / mL). A negative result must be combined with clinical observations, patient  history, and epidemiological information.  Fact Sheet for Patients:   RoadLapTop.co.za  Fact Sheet for Healthcare Providers: http://kim-miller.com/  This test is not yet approved or  cleared by the [place] FDA and has been authorized for detection and/or diagnosis of SARS-CoV-2 by FDA under an Emergency Use Authorization (EUA).  This EUA will remain in effect (meaning this test can be used) for the duration of the COVID-19 declaration under Section 564(b)(1) of the Act, 21 U.S.C. section 360bbb-3(b)(1), unless the authorization is terminated or revoked sooner.  Performed at [facility], [address] [place], [place] [address]   Culture, blood (Routine X 2) w Reflex to ID Panel     Status: None (Preliminary result)   Collection Time: 02/25/23  7:57 AM   Specimen: BLOOD  Result Value Ref Range Status   Specimen Description   Final    BLOOD BLOOD LEFT HAND Performed at [facility], [address] [place], [place] [address]    Special Requests   Final    BOTTLES  DRAWN AEROBIC AND ANAEROBIC Blood Culture results may not be optimal due to an inadequate volume of blood received in culture bottles Performed at [facility], [address], [place], [place] [address]    Culture   Final    NO GROWTH 2 DAYS Performed at [facility], [address], [place], [place] [address]    Report Status PENDING  Incomplete  Culture, blood (Routine X 2) w Reflex to ID Panel     Status: None (Preliminary result)   Collection Time: 02/25/23 12:30 PM   Specimen: BLOOD RIGHT ARM  Result Value Ref Range Status   Specimen Description   Final    BLOOD RIGHT ARM Performed at [facility], [address] [place], [place] [address]    Special Requests   Final    AEROBIC BOTTLE ONLY Blood Culture adequate volume Performed at [facility], [address], [place], [place] [address]    Culture   Final    NO GROWTH 2 DAYS Performed at [facility], [address] [place], [place] [address]    Report Status PENDING  Incomplete     Medications:    amitriptyline  10 mg Oral QHS   aspirin EC  81 mg Oral Daily   cholecalciferol  2,000 Units Oral QPM   cholecalciferol  2,000 Units Oral QHS   clopidogrel  75 mg Oral Daily   clotrimazole  10 mg Oral 5 X Daily   docusate sodium  200 mg Oral QHS   enoxaparin (LOVENOX) injection  30 mg Subcutaneous Q24H   famotidine  20 mg Oral Daily   ipratropium-albuterol  3 mL Nebulization BID   ketorolac  1 drop Right Eye BID   loratadine  10 mg Oral Daily   mometasone-formoterol  2 puff Inhalation BID   predniSONE  40 mg Oral Q breakfast   rosuvastatin  40 mg Oral Daily   timolol  1 drop Both Eyes BID   Continuous Infusions:    LOS: 2 days   Marinda Elk  [facility]  02/27/2023, 11:30 AM

## 2023-02-27 NOTE — Evaluation (Signed)
Physical Therapy One Time Evaluation Patient Details Name: ESLY JIMINIAN MRN: [id number] DOB: 19-Mar-1929 Today's Date: 02/27/2023  History of Present Illness  89 y.o. female past medical history significant for COPD, anemia of chronic disease, essential hypertension, history of TIAs, loop recorder 12/30/21, chronic kidney disease stage III AA, who is hearing impaired and admitted for COPD exacerbation  Clinical Impression  Patient evaluated by Physical Therapy with no further acute PT needs identified. All education has been completed and the patient has no further questions.  Pt very pleasant and ready to mobilize.  Pt first assisted to bathroom, and she was able to use toilet and wash hands without assist.  Pt then ambulated in hallway good distance.  Recommend staff continue to assist pt with ambulation during remainder of acute admission. PT is signing off. Thank you for this referral.        Recommendations for follow up therapy are one component of a multi-disciplinary discharge planning process, led by the attending physician.  Recommendations may be updated based on patient status, additional functional criteria and insurance authorization.  Follow Up Recommendations       Assistance Recommended at Discharge PRN  Patient can return home with the following       Equipment Recommendations None recommended by PT  Recommendations for Other Services       Functional Status Assessment Patient has not had a recent decline in their functional status     Precautions / Restrictions Precautions Precautions: Fall Precaution Comments: monitor sats Restrictions Weight Bearing Restrictions: No      Mobility  Bed Mobility Overal bed mobility: Modified Independent                  Transfers Overall transfer level: Needs assistance Equipment used: Rolling walker (2 wheels) Transfers: Sit to/from Stand Sit to Stand: Supervision, Min guard           General transfer  comment: uses UEs to self assist    Ambulation/Gait Ambulation/Gait assistance: Min guard, Supervision Gait Distance (Feet): 200 Feet Assistive device: Rolling walker (2 wheels) Gait Pattern/deviations: Step-through pattern, Decreased stride length, Trunk flexed Gait velocity: decr     General Gait Details: very slow pace but steady with RW, denies dyspnea, SpO2 88-91% on room air during ambulation (and 92% on room air at rest and upon sitting in recliner end of session)  Stairs            Wheelchair Mobility    Modified Rankin (Stroke Patients Only)       Balance Overall balance assessment: Needs assistance         Standing balance support: Bilateral upper extremity supported, Reliant on assistive device for balance, During functional activity Standing balance-Leahy Scale: Poor                               Pertinent Vitals/Pain Pain Assessment Pain Assessment: No/denies pain    Home Living   Living Arrangements: Alone Available Help at Discharge: Friend(s);Family;Available PRN/intermittently Type of Home: Independent living facility           Home Equipment: Rollator (4 wheels);Grab bars - tub/shower;Grab bars - toilet;Hand held shower head Additional Comments[facility]    Prior Function Prior Level of Function : Independent/Modified Independent;Needs assist             Mobility Comments: pt using rollator in the apartment and in community ADLs Comments: pt  reports ind with self care tasks, light cleaning, goes to dining hall for one meal per day     Hand Dominance        Extremity/Trunk Assessment        Lower Extremity Assessment Lower Extremity Assessment: Overall WFL for tasks assessed    Cervical / Trunk Assessment Cervical / Trunk Assessment: Kyphotic  Communication   Communication: HOH  Cognition Arousal/Alertness: Awake/alert Behavior During Therapy: WFL for tasks assessed/performed Overall  Cognitive Status: Within Functional Limits for tasks assessed                                          General Comments      Exercises     Assessment/Plan    PT Assessment Patient does not need any further PT services  PT Problem List         PT Treatment Interventions      PT Goals (Current goals can be found in the Care Plan section)  Acute Rehab PT Goals PT Goal Formulation: All assessment and education complete, DC therapy    Frequency       Co-evaluation               AM-PAC PT "6 Clicks" Mobility  Outcome Measure Help needed turning from your back to your side while in a flat bed without using bedrails?: None Help needed moving from lying on your back to sitting on the side of a flat bed without using bedrails?: None Help needed moving to and from a bed to a chair (including a wheelchair)?: None Help needed standing up from a chair using your arms (e.g., wheelchair or bedside chair)?: None Help needed to walk in hospital room?: A Little Help needed climbing 3-5 steps with a railing? : A Little 6 Click Score: 22    End of Session   Activity Tolerance: Patient tolerated treatment well Patient left: in chair;with call bell/phone within reach;with chair alarm set Nurse Communication: Mobility status PT Visit Diagnosis: Difficulty in walking, not elsewhere classified (R26.2)    Time: 2440-1027 PT Time Calculation (min) (ACUTE ONLY): 22 min   Charges:   PT Evaluation $PT Eval Low Complexity: 1 Low         Kati PT, DPT Physical Therapist Acute Rehabilitation Services Office: [phone]   Janan Halter [place] 02/27/2023, 12:43 PM

## 2023-02-28 ENCOUNTER — Other Ambulatory Visit: Payer: Self-pay | Admitting: Internal Medicine

## 2023-02-28 DIAGNOSIS — K449 Diaphragmatic hernia without obstruction or gangrene: Secondary | ICD-10-CM | POA: Diagnosis not present

## 2023-02-28 DIAGNOSIS — I119 Hypertensive heart disease without heart failure: Secondary | ICD-10-CM

## 2023-02-28 DIAGNOSIS — K219 Gastro-esophageal reflux disease without esophagitis: Secondary | ICD-10-CM | POA: Diagnosis not present

## 2023-02-28 DIAGNOSIS — J441 Chronic obstructive pulmonary disease with (acute) exacerbation: Secondary | ICD-10-CM | POA: Diagnosis not present

## 2023-02-28 LAB — CULTURE, BLOOD (ROUTINE X 2): Culture: NO GROWTH

## 2023-02-28 MED ORDER — ALBUTEROL SULFATE (2.5 MG/3ML) 0.083% IN NEBU: 2.5000 mg | INHALATION_SOLUTION | RESPIRATORY_TRACT | 12 refills | Status: DC | PRN

## 2023-02-28 MED ORDER — AZITHROMYCIN 250 MG PO TABS: ORAL_TABLET | ORAL | 0 refills | Status: DC

## 2023-02-28 MED ORDER — PREDNISONE 10 MG PO TABS: ORAL_TABLET | ORAL | 0 refills | Status: DC

## 2023-02-28 MED ORDER — AZITHROMYCIN 250 MG PO TABS
500.0000 mg | ORAL_TABLET | Freq: Every day | ORAL | Status: DC
  Administered 2023-02-28: 500 mg via ORAL
  Filled 2023-02-28: qty 2

## 2023-02-28 MED ORDER — ALBUTEROL SULFATE HFA 108 (90 BASE) MCG/ACT IN AERS: 2.0000 | INHALATION_SPRAY | Freq: Four times a day (QID) | RESPIRATORY_TRACT | 2 refills | Status: DC | PRN

## 2023-02-28 NOTE — Plan of Care (Signed)
  Problem: Activity: Goal: Ability to tolerate increased activity will improve Outcome: Progressing   Problem: Activity: Goal: Will verbalize the importance of balancing activity with adequate rest periods Outcome: Progressing   Problem: Respiratory: Goal: Ability to maintain a clear airway will improve Outcome: Progressing   Problem: Respiratory: Goal: Levels of oxygenation will improve Outcome: Progressing   Problem: Respiratory: Goal: Ability to maintain adequate ventilation will improve Outcome: Progressing   

## 2023-02-28 NOTE — Progress Notes (Signed)
Discharge AVS discussed with patient. IV removed. Discussed new medications including albuterol inhaler, antibiotic, and steroid that will be starting. Opportunity for questions given, all questions answered.   Smiley Houseman, RN

## 2023-02-28 NOTE — TOC Transition Note (Signed)
Transition of Care [facility]) - CM/SW Discharge Note   Patient Details  Name: Tina Mercado MRN: [id number] Date of Birth: 11/21/1928  [facility] CM/SW Contact:  Adrian Prows, RN Phone Number: 02/28/2023, 12:40 PM   Clinical Narrative:    D/C orders received; Dr Robb Matar clarified pt will have albuterol inhaler and does not need nebulizer; no TOC needs,   Final next level of care: Home/Self Care Barriers to Discharge: No Barriers Identified   Patient Goals and CMS Choice      Discharge Placement                         Discharge Plan and Services Additional resources added to the After Visit Summary for                                       Social Determinants of Health (SDOH) Interventions SDOH Screenings   Food Insecurity: No Food Insecurity (02/25/2023)  Housing: Low Risk  (02/25/2023)  Transportation Needs: No Transportation Needs (02/25/2023)  Utilities: Not At Risk (02/25/2023)  Depression (PHQ2-9): Low Risk  (02/12/2023)  Tobacco Use: Low Risk  (02/25/2023)     Readmission Risk Interventions     No data to display

## 2023-02-28 NOTE — Progress Notes (Signed)
Mobility Specialist - Progress Note   02/28/23 0943  Mobility  Activity Ambulated with assistance in hallway  Level of Assistance Standby assist, set-up cues, supervision of patient - no hands on  Assistive Device Front wheel walker  Distance Ambulated (ft) 350 ft  Range of Motion/Exercises Active  Activity Response Tolerated well  Mobility Referral Yes  $Mobility charge 1 Mobility   Pt received in chair and agreed to mobility, had no issues throughout session. Pt returned to chair with all needs met.  Marilynne Halsted Mobility Specialist
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
## 2023-03-01 LAB — CULTURE, BLOOD (ROUTINE X 2): Culture: NO GROWTH

## 2023-03-02 LAB — CULTURE, BLOOD (ROUTINE X 2)

## 2023-03-03 DIAGNOSIS — R7303 Prediabetes: Secondary | ICD-10-CM | POA: Diagnosis not present

## 2023-03-03 DIAGNOSIS — G629 Polyneuropathy, unspecified: Secondary | ICD-10-CM | POA: Diagnosis not present

## 2023-03-03 DIAGNOSIS — G459 Transient cerebral ischemic attack, unspecified: Secondary | ICD-10-CM | POA: Diagnosis not present

## 2023-03-03 DIAGNOSIS — D649 Anemia, unspecified: Secondary | ICD-10-CM | POA: Diagnosis not present

## 2023-03-03 DIAGNOSIS — J9601 Acute respiratory failure with hypoxia: Secondary | ICD-10-CM | POA: Diagnosis not present

## 2023-03-03 DIAGNOSIS — N1831 Chronic kidney disease, stage 3a: Secondary | ICD-10-CM | POA: Diagnosis not present

## 2023-03-03 DIAGNOSIS — J441 Chronic obstructive pulmonary disease with (acute) exacerbation: Secondary | ICD-10-CM | POA: Diagnosis not present

## 2023-03-03 DIAGNOSIS — M810 Age-related osteoporosis without current pathological fracture: Secondary | ICD-10-CM | POA: Diagnosis not present

## 2023-03-03 DIAGNOSIS — E785 Hyperlipidemia, unspecified: Secondary | ICD-10-CM | POA: Diagnosis not present

## 2023-03-03 DIAGNOSIS — M8000XA Age-related osteoporosis with current pathological fracture, unspecified site, initial encounter for fracture: Secondary | ICD-10-CM | POA: Diagnosis not present

## 2023-03-03 DIAGNOSIS — K219 Gastro-esophageal reflux disease without esophagitis: Secondary | ICD-10-CM | POA: Diagnosis not present

## 2023-03-03 DIAGNOSIS — Z8673 Personal history of transient ischemic attack (TIA), and cerebral infarction without residual deficits: Secondary | ICD-10-CM | POA: Diagnosis not present

## 2023-03-03 DIAGNOSIS — R531 Weakness: Secondary | ICD-10-CM | POA: Diagnosis not present

## 2023-03-03 DIAGNOSIS — I10 Essential (primary) hypertension: Secondary | ICD-10-CM | POA: Diagnosis not present

## 2023-03-03 DIAGNOSIS — K449 Diaphragmatic hernia without obstruction or gangrene: Secondary | ICD-10-CM | POA: Diagnosis not present

## 2023-03-03 DIAGNOSIS — Z95818 Presence of other cardiac implants and grafts: Secondary | ICD-10-CM | POA: Diagnosis not present

## 2023-03-03 DIAGNOSIS — J449 Chronic obstructive pulmonary disease, unspecified: Secondary | ICD-10-CM | POA: Diagnosis not present

## 2023-03-04 ENCOUNTER — Encounter: Payer: Medicare Other | Admitting: Family Medicine

## 2023-03-04 ENCOUNTER — Non-Acute Institutional Stay (SKILLED_NURSING_FACILITY): Payer: Medicare Other | Admitting: Adult Health

## 2023-03-04 ENCOUNTER — Encounter: Payer: Self-pay | Admitting: Adult Health

## 2023-03-04 DIAGNOSIS — J9601 Acute respiratory failure with hypoxia: Secondary | ICD-10-CM

## 2023-03-04 DIAGNOSIS — J441 Chronic obstructive pulmonary disease with (acute) exacerbation: Secondary | ICD-10-CM

## 2023-03-04 MED ORDER — DOXYCYCLINE HYCLATE 100 MG PO TABS
100.0000 mg | ORAL_TABLET | Freq: Two times a day (BID) | ORAL | 0 refills | Status: AC
Start: 2023-03-04 — End: 2023-03-09
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
## 2023-03-10 ENCOUNTER — Non-Acute Institutional Stay (SKILLED_NURSING_FACILITY): Payer: Medicare Other | Admitting: Family Medicine

## 2023-03-10 DIAGNOSIS — R531 Weakness: Secondary | ICD-10-CM

## 2023-03-10 DIAGNOSIS — J441 Chronic obstructive pulmonary disease with (acute) exacerbation: Secondary | ICD-10-CM

## 2023-03-10 DIAGNOSIS — I10 Essential (primary) hypertension: Secondary | ICD-10-CM | POA: Diagnosis not present

## 2023-03-10 DIAGNOSIS — M8000XA Age-related osteoporosis with current pathological fracture, unspecified site, initial encounter for fracture: Secondary | ICD-10-CM | POA: Diagnosis not present

## 2023-03-10 DIAGNOSIS — Z8673 Personal history of transient ischemic attack (TIA), and cerebral infarction without residual deficits: Secondary | ICD-10-CM | POA: Diagnosis not present

## 2023-03-10 DIAGNOSIS — Z95818 Presence of other cardiac implants and grafts: Secondary | ICD-10-CM
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
## 2023-03-12 ENCOUNTER — Ambulatory Visit: Payer: Medicare Other

## 2023-03-16 NOTE — Progress Notes (Signed)
[place] [facility] Recorder 

## 2023-03-20 ENCOUNTER — Telehealth: Payer: Self-pay | Admitting: Cardiology

## 2023-03-20 NOTE — Telephone Encounter (Signed)
Pt c/o medication issue:  1. Name of Medication:   clopidogrel (PLAVIX) 75 MG tablet    aspirin EC 81 MG tablet    2. How are you currently taking this medication (dosage and times per day)? As written   3. Are you having a reaction (difficulty breathing--STAT)? no  4. What is your medication issue? Lanora Manis, the pt's nurse at [facility] and they want to know should pt be taking both these medications. Please advise.

## 2023-03-20 NOTE — Telephone Encounter (Signed)
Spoke with Tina Mercado, aware plavix is given to the patient by neurology for TIA. She would need to continue the aspirin for her heart and they will need to contact neuro about the plavix.

## 2023-03-20 NOTE — Telephone Encounter (Signed)
Lanora Manis, RN from [facility] called to report the patient is currently transferred to SNF. The pharmacy from [facility] wanted clarification if the patient should be taking Asprin 81 mg and Plavix 75 mg. Provide Lanora Manis the fax number for the office to complete , she will like for the MD to fill out the document and fax it to [phone],  ATTN: Lanora Manis. Will send to MD and nurse.

## 2023-03-25 ENCOUNTER — Non-Acute Institutional Stay (SKILLED_NURSING_FACILITY): Payer: Medicare Other | Admitting: Nurse Practitioner

## 2023-03-25 ENCOUNTER — Encounter: Payer: Self-pay | Admitting: Nurse Practitioner

## 2023-03-25 DIAGNOSIS — N1831 Chronic kidney disease, stage 3a: Secondary | ICD-10-CM | POA: Diagnosis not present

## 2023-03-25 DIAGNOSIS — K219 Gastro-esophageal reflux disease without esophagitis: Secondary | ICD-10-CM | POA: Diagnosis not present

## 2023-03-25 DIAGNOSIS — J449 Chronic obstructive pulmonary disease, unspecified: Secondary | ICD-10-CM | POA: Diagnosis not present

## 2023-03-25 DIAGNOSIS — I10 Essential (primary) hypertension: Secondary | ICD-10-CM

## 2023-03-25 DIAGNOSIS — K449 Diaphragmatic hernia without obstruction or gangrene: Secondary | ICD-10-CM | POA: Diagnosis not present

## 2023-03-25 DIAGNOSIS — D649 Anemia, unspecified: Secondary | ICD-10-CM

## 2023-03-25 DIAGNOSIS — G629 Polyneuropathy, unspecified: Secondary | ICD-10-CM | POA: Diagnosis not present

## 2023-03-25 DIAGNOSIS — G459 Transient cerebral ischemic attack, unspecified: Secondary | ICD-10-CM

## 2023-03-25 NOTE — Assessment & Plan Note (Signed)
Bun/creat 19/1.23 02/26/23

## 2023-03-25 NOTE — Assessment & Plan Note (Signed)
numbness, burning sensation BLE, takes Amitriptyline ?

## 2023-03-25 NOTE — Assessment & Plan Note (Signed)
Hgb 10.2 02/26/23

## 2023-03-25 NOTE — Assessment & Plan Note (Signed)
Stable, takes Famotidine, Hgb 10.2 02/26/23

## 2023-03-25 NOTE — Assessment & Plan Note (Signed)
Continue inhalers, right base rales, occasional cough, no phlegm production, stable, continue incentive spirometer.

## 2023-03-25 NOTE — Assessment & Plan Note (Signed)
controlled blood pressure w/o meds.

## 2023-03-25 NOTE — Assessment & Plan Note (Signed)
Stroke/TIA, takes Plavix, ASA, Rosuvastatin
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
## 2023-03-30 ENCOUNTER — Ambulatory Visit: Payer: Medicare Other

## 2023-04-01 ENCOUNTER — Non-Acute Institutional Stay: Payer: Medicare Other | Admitting: Adult Health

## 2023-04-01 ENCOUNTER — Encounter: Payer: Self-pay | Admitting: Adult Health

## 2023-04-01 DIAGNOSIS — G459 Transient cerebral ischemic attack, unspecified: Secondary | ICD-10-CM | POA: Diagnosis not present

## 2023-04-01 DIAGNOSIS — J449 Chronic obstructive pulmonary disease, unspecified: Secondary | ICD-10-CM

## 2023-04-01 DIAGNOSIS — K2100 Gastro-esophageal reflux disease with esophagitis, without bleeding: Secondary | ICD-10-CM

## 2023-04-01 DIAGNOSIS — N1832 Chronic kidney disease, stage 3b: Secondary | ICD-10-CM | POA: Diagnosis not present

## 2023-04-01 MED ORDER — PANTOPRAZOLE SODIUM 40 MG PO TBEC
40.0000 mg | DELAYED_RELEASE_TABLET | Freq: Every day | ORAL | 3 refills | Status: DC
Start: 2023-04-01 — End: 2023-04-02
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
## 2023-04-02 ENCOUNTER — Encounter: Payer: Self-pay | Admitting: Nurse Practitioner

## 2023-04-02 DIAGNOSIS — H5410 Blindness, one eye, low vision other eye, unspecified eyes: Secondary | ICD-10-CM

## 2023-04-02 DIAGNOSIS — R41841 Cognitive communication deficit: Secondary | ICD-10-CM | POA: Diagnosis not present

## 2023-04-02 DIAGNOSIS — R2681 Unsteadiness on feet: Secondary | ICD-10-CM | POA: Diagnosis not present

## 2023-04-02 DIAGNOSIS — M6281 Muscle weakness (generalized): Secondary | ICD-10-CM | POA: Diagnosis not present

## 2023-04-02 DIAGNOSIS — R278 Other lack of coordination: Secondary | ICD-10-CM | POA: Diagnosis not present

## 2023-04-02 DIAGNOSIS — R2689 Other abnormalities of gait and mobility: Secondary | ICD-10-CM | POA: Diagnosis not present

## 2023-04-02 DIAGNOSIS — K219 Gastro-esophageal reflux disease without esophagitis: Secondary | ICD-10-CM

## 2023-04-02 DIAGNOSIS — K2100 Gastro-esophageal reflux disease with esophagitis, without bleeding: Secondary | ICD-10-CM

## 2023-04-02 DIAGNOSIS — H409 Unspecified glaucoma: Secondary | ICD-10-CM

## 2023-04-02 DIAGNOSIS — G629 Polyneuropathy, unspecified: Secondary | ICD-10-CM

## 2023-04-03 MED ORDER — BISACODYL 10 MG RE SUPP: 10.0000 mg | Freq: Every day | RECTAL | 0 refills | Status: DC | PRN

## 2023-04-03 MED ORDER — MOMETASONE FURO-FORMOTEROL FUM 50-5 MCG/ACT IN AERO: 1.0000 | INHALATION_SPRAY | Freq: Two times a day (BID) | RESPIRATORY_TRACT | 0 refills | Status: DC

## 2023-04-03 MED ORDER — DOCUSATE SODIUM 100 MG PO CAPS: 100.0000 mg | ORAL_CAPSULE | ORAL | 0 refills | Status: DC

## 2023-04-03 MED ORDER — ROSUVASTATIN CALCIUM 40 MG PO TABS: 40.0000 mg | ORAL_TABLET | Freq: Every day | ORAL | 2 refills | Status: DC

## 2023-04-03 MED ORDER — KETOROLAC TROMETHAMINE 0.5 % OP SOLN: 1.0000 [drp] | Freq: Two times a day (BID) | OPHTHALMIC | 0 refills | Status: DC

## 2023-04-03 MED ORDER — TIMOLOL MALEATE 0.5 % OP SOLN: 1.0000 [drp] | Freq: Two times a day (BID) | OPHTHALMIC | 2 refills | Status: DC

## 2023-04-03 MED ORDER — ALBUTEROL SULFATE HFA 108 (90 BASE) MCG/ACT IN AERS: 2.0000 | INHALATION_SPRAY | Freq: Four times a day (QID) | RESPIRATORY_TRACT | 2 refills | Status: DC | PRN

## 2023-04-03 MED ORDER — FAMOTIDINE 20 MG PO TABS
20.0000 mg | ORAL_TABLET | Freq: Every day | ORAL | 3 refills | Status: DC
Start: 2023-04-03 — End: 2023-08-28

## 2023-04-03 MED ORDER — CLOPIDOGREL BISULFATE 75 MG PO TABS: 75.0000 mg | ORAL_TABLET | Freq: Every day | ORAL | 1 refills | Status: DC

## 2023-04-03 MED ORDER — AMITRIPTYLINE HCL 10 MG PO TABS
ORAL_TABLET | ORAL | 3 refills | Status: DC
Start: 2023-04-03 — End: 2024-02-29

## 2023-04-03 MED ORDER — PANTOPRAZOLE SODIUM 40 MG PO TBEC: 40.0000 mg | DELAYED_RELEASE_TABLET | Freq: Every day | ORAL | 3 refills | Status: DC

## 2023-04-03 MED ORDER — VITAMIN D3 50 MCG (2000 UT) PO TABS: 2000.0000 [IU] | ORAL_TABLET | Freq: Every day | ORAL | 0 refills | Status: DC

## 2023-04-03 NOTE — Progress Notes (Signed)
This encounter was created in error - please disregard.

## 2023-04-08 DIAGNOSIS — R41841 Cognitive communication deficit: Secondary | ICD-10-CM | POA: Diagnosis not present

## 2023-04-08 DIAGNOSIS — R2681 Unsteadiness on feet: Secondary | ICD-10-CM | POA: Diagnosis not present

## 2023-04-08 DIAGNOSIS — R2689 Other abnormalities of gait and mobility: Secondary | ICD-10-CM | POA: Diagnosis not present

## 2023-04-08 DIAGNOSIS — R278 Other lack of coordination: Secondary | ICD-10-CM | POA: Diagnosis not present

## 2023-04-08 DIAGNOSIS — M6281 Muscle weakness (generalized): Secondary | ICD-10-CM | POA: Diagnosis not present

## 2023-04-09 DIAGNOSIS — R41841 Cognitive communication deficit: Secondary | ICD-10-CM | POA: Diagnosis not present

## 2023-04-09 DIAGNOSIS — M6281 Muscle weakness (generalized): Secondary | ICD-10-CM | POA: Diagnosis not present

## 2023-04-09 DIAGNOSIS — R2681 Unsteadiness on feet: Secondary | ICD-10-CM | POA: Diagnosis not present

## 2023-04-09 DIAGNOSIS — R2689 Other abnormalities of gait and mobility: Secondary | ICD-10-CM | POA: Diagnosis not present

## 2023-04-09 DIAGNOSIS — R278 Other lack of coordination: Secondary | ICD-10-CM | POA: Diagnosis not present

## 2023-04-13 ENCOUNTER — Ambulatory Visit (INDEPENDENT_AMBULATORY_CARE_PROVIDER_SITE_OTHER): Payer: Medicare Other

## 2023-04-13 DIAGNOSIS — Z8673 Personal history of transient ischemic attack (TIA), and cerebral infarction without residual deficits: Secondary | ICD-10-CM | POA: Diagnosis not present

## 2023-04-14 DIAGNOSIS — R2681 Unsteadiness on feet: Secondary | ICD-10-CM | POA: Diagnosis not present

## 2023-04-14 DIAGNOSIS — M6281 Muscle weakness (generalized): Secondary | ICD-10-CM | POA: Diagnosis not present

## 2023-04-14 DIAGNOSIS — R2689 Other abnormalities of gait and mobility: Secondary | ICD-10-CM | POA: Diagnosis not present

## 2023-04-14 DIAGNOSIS — R41841 Cognitive communication deficit: Secondary | ICD-10-CM | POA: Diagnosis not present

## 2023-04-14 DIAGNOSIS — R278 Other lack of coordination: Secondary | ICD-10-CM | POA: Diagnosis not present

## 2023-04-14 LAB — CUP PACEART REMOTE DEVICE CHECK
Date Time Interrogation Session: 20240617002000
Implantable Pulse Generator Implant Date: 20230309
Pulse Gen Serial Number: 172093

## 2023-04-16 DIAGNOSIS — R278 Other lack of coordination: Secondary | ICD-10-CM | POA: Diagnosis not present

## 2023-04-16 DIAGNOSIS — R2689 Other abnormalities of gait and mobility: Secondary | ICD-10-CM | POA: Diagnosis not present

## 2023-04-16 DIAGNOSIS — M6281 Muscle weakness (generalized): Secondary | ICD-10-CM | POA: Diagnosis not present

## 2023-04-16 DIAGNOSIS — R2681 Unsteadiness on feet: Secondary | ICD-10-CM | POA: Diagnosis not present

## 2023-04-16 DIAGNOSIS — R41841 Cognitive communication deficit: Secondary | ICD-10-CM | POA: Diagnosis not present

## 2023-04-20 DIAGNOSIS — R41841 Cognitive communication deficit: Secondary | ICD-10-CM | POA: Diagnosis not present

## 2023-04-20 DIAGNOSIS — R2681 Unsteadiness on feet: Secondary | ICD-10-CM | POA: Diagnosis not present

## 2023-04-20 DIAGNOSIS — R2689 Other abnormalities of gait and mobility: Secondary | ICD-10-CM | POA: Diagnosis not present

## 2023-04-20 DIAGNOSIS — M6281 Muscle weakness (generalized): Secondary | ICD-10-CM | POA: Diagnosis not present

## 2023-04-20 DIAGNOSIS — R278 Other lack of coordination: Secondary | ICD-10-CM | POA: Diagnosis not present

## 2023-04-21 DIAGNOSIS — R41841 Cognitive communication deficit: Secondary | ICD-10-CM | POA: Diagnosis not present

## 2023-04-21 DIAGNOSIS — R278 Other lack of coordination: Secondary | ICD-10-CM | POA: Diagnosis not present

## 2023-04-21 DIAGNOSIS — M6281 Muscle weakness (generalized): Secondary | ICD-10-CM | POA: Diagnosis not present

## 2023-04-21 DIAGNOSIS — R2681 Unsteadiness on feet: Secondary | ICD-10-CM | POA: Diagnosis not present

## 2023-04-21 DIAGNOSIS — R2689 Other abnormalities of gait and mobility: Secondary | ICD-10-CM | POA: Diagnosis not present

## 2023-04-23 DIAGNOSIS — R278 Other lack of coordination: Secondary | ICD-10-CM | POA: Diagnosis not present

## 2023-04-23 DIAGNOSIS — M6281 Muscle weakness (generalized): Secondary | ICD-10-CM | POA: Diagnosis not present

## 2023-04-23 DIAGNOSIS — R2689 Other abnormalities of gait and mobility: Secondary | ICD-10-CM | POA: Diagnosis not present

## 2023-04-23 DIAGNOSIS — R41841 Cognitive communication deficit: Secondary | ICD-10-CM | POA: Diagnosis not present

## 2023-04-23 DIAGNOSIS — R2681 Unsteadiness on feet: Secondary | ICD-10-CM | POA: Diagnosis not present

## 2023-04-28 DIAGNOSIS — R2681 Unsteadiness on feet: Secondary | ICD-10-CM | POA: Diagnosis not present

## 2023-04-28 DIAGNOSIS — R41841 Cognitive communication deficit: Secondary | ICD-10-CM | POA: Diagnosis not present

## 2023-04-28 DIAGNOSIS — R278 Other lack of coordination: Secondary | ICD-10-CM | POA: Diagnosis not present

## 2023-04-28 DIAGNOSIS — R2689 Other abnormalities of gait and mobility: Secondary | ICD-10-CM | POA: Diagnosis not present

## 2023-04-28 DIAGNOSIS — M6281 Muscle weakness (generalized): Secondary | ICD-10-CM | POA: Diagnosis not present

## 2023-05-01 DIAGNOSIS — R41841 Cognitive communication deficit: Secondary | ICD-10-CM | POA: Diagnosis not present

## 2023-05-01 DIAGNOSIS — R2681 Unsteadiness on feet: Secondary | ICD-10-CM | POA: Diagnosis not present

## 2023-05-01 DIAGNOSIS — R278 Other lack of coordination: Secondary | ICD-10-CM | POA: Diagnosis not present

## 2023-05-01 DIAGNOSIS — M6281 Muscle weakness (generalized): Secondary | ICD-10-CM | POA: Diagnosis not present

## 2023-05-01 DIAGNOSIS — R2689 Other abnormalities of gait and mobility: Secondary | ICD-10-CM | POA: Diagnosis not present

## 2023-05-04 NOTE — Progress Notes (Signed)
[place] [facility] Recorder 

## 2023-05-05 DIAGNOSIS — R2689 Other abnormalities of gait and mobility: Secondary | ICD-10-CM | POA: Diagnosis not present

## 2023-05-05 DIAGNOSIS — M6281 Muscle weakness (generalized): Secondary | ICD-10-CM | POA: Diagnosis not present

## 2023-05-05 DIAGNOSIS — R278 Other lack of coordination: Secondary | ICD-10-CM | POA: Diagnosis not present

## 2023-05-05 DIAGNOSIS — R41841 Cognitive communication deficit: Secondary | ICD-10-CM | POA: Diagnosis not present

## 2023-05-05 DIAGNOSIS — R2681 Unsteadiness on feet: Secondary | ICD-10-CM | POA: Diagnosis not present

## 2023-05-06 DIAGNOSIS — H2512 Age-related nuclear cataract, left eye: Secondary | ICD-10-CM | POA: Diagnosis not present

## 2023-05-06 DIAGNOSIS — H353132 Nonexudative age-related macular degeneration, bilateral, intermediate dry stage: Secondary | ICD-10-CM | POA: Diagnosis not present

## 2023-05-06 DIAGNOSIS — Z961 Presence of intraocular lens: Secondary | ICD-10-CM | POA: Diagnosis not present

## 2023-05-06 DIAGNOSIS — H18512 Endothelial corneal dystrophy, left eye: Secondary | ICD-10-CM | POA: Diagnosis not present

## 2023-05-06 DIAGNOSIS — Z947 Corneal transplant status: Secondary | ICD-10-CM | POA: Diagnosis not present

## 2023-05-06 DIAGNOSIS — H02401 Unspecified ptosis of right eyelid: Secondary | ICD-10-CM | POA: Diagnosis not present

## 2023-05-07 ENCOUNTER — Other Ambulatory Visit: Payer: Self-pay | Admitting: Nurse Practitioner

## 2023-05-07 DIAGNOSIS — R278 Other lack of coordination: Secondary | ICD-10-CM | POA: Diagnosis not present

## 2023-05-07 DIAGNOSIS — R2681 Unsteadiness on feet: Secondary | ICD-10-CM | POA: Diagnosis not present

## 2023-05-07 DIAGNOSIS — R41841 Cognitive communication deficit: Secondary | ICD-10-CM | POA: Diagnosis not present

## 2023-05-07 DIAGNOSIS — R2689 Other abnormalities of gait and mobility: Secondary | ICD-10-CM | POA: Diagnosis not present

## 2023-05-07 DIAGNOSIS — M6281 Muscle weakness (generalized): Secondary | ICD-10-CM | POA: Diagnosis not present

## 2023-05-07 NOTE — Telephone Encounter (Signed)
Patient medication has Allergy Contraindications. Medication pend and sent to PCP Hyacinth Meeker Bertram Millard, MD

## 2023-05-14 ENCOUNTER — Ambulatory Visit (INDEPENDENT_AMBULATORY_CARE_PROVIDER_SITE_OTHER): Payer: Medicare Other

## 2023-05-14 DIAGNOSIS — G459 Transient cerebral ischemic attack, unspecified: Secondary | ICD-10-CM

## 2023-05-18 LAB — CUP PACEART REMOTE DEVICE CHECK
Date Time Interrogation Session: 20240722002600
Implantable Pulse Generator Implant Date: 20230309
Pulse Gen Serial Number: 172093

## 2023-05-28 NOTE — Progress Notes (Signed)
Carelink Summary Report / Loop Recorder 

## 2023-05-31 ENCOUNTER — Other Ambulatory Visit: Payer: Self-pay | Admitting: Nurse Practitioner

## 2023-05-31 DIAGNOSIS — H5410 Blindness, one eye, low vision other eye, unspecified eyes: Secondary | ICD-10-CM

## 2023-06-01 NOTE — Telephone Encounter (Signed)
Patient medication has allergy contraindications.

## 2023-06-05 ENCOUNTER — Other Ambulatory Visit: Payer: Self-pay | Admitting: Nurse Practitioner

## 2023-06-05 NOTE — Telephone Encounter (Signed)
High Risk Warning Populated when attempting to refill, I will send to Provider for further review 

## 2023-06-07 ENCOUNTER — Other Ambulatory Visit: Payer: Self-pay | Admitting: Family Medicine

## 2023-06-07 DIAGNOSIS — J449 Chronic obstructive pulmonary disease, unspecified: Secondary | ICD-10-CM

## 2023-06-08 NOTE — Telephone Encounter (Signed)
Patient has request refill on medication. This medication was discontinued by another medical assistant. Please advise.

## 2023-06-15 ENCOUNTER — Ambulatory Visit (INDEPENDENT_AMBULATORY_CARE_PROVIDER_SITE_OTHER): Payer: Medicare Other

## 2023-06-15 DIAGNOSIS — Z8673 Personal history of transient ischemic attack (TIA), and cerebral infarction without residual deficits: Secondary | ICD-10-CM

## 2023-06-16 LAB — CUP PACEART REMOTE DEVICE CHECK
Date Time Interrogation Session: 20240820013100
Implantable Pulse Generator Implant Date: 20230309
Pulse Gen Serial Number: 172093

## 2023-06-25 NOTE — Progress Notes (Signed)
[place] [facility] Recorder 

## 2023-07-16 ENCOUNTER — Ambulatory Visit (INDEPENDENT_AMBULATORY_CARE_PROVIDER_SITE_OTHER): Payer: Medicare Other

## 2023-07-16 DIAGNOSIS — G459 Transient cerebral ischemic attack, unspecified: Secondary | ICD-10-CM | POA: Diagnosis not present

## 2023-07-20 LAB — CUP PACEART REMOTE DEVICE CHECK
Date Time Interrogation Session: 20240923003600
Implantable Pulse Generator Implant Date: 20230309
Pulse Gen Serial Number: 172093

## 2023-07-28 NOTE — Progress Notes (Signed)
Carelink Summary Report / Loop Recorder 

## 2023-07-30 ENCOUNTER — Other Ambulatory Visit: Payer: Self-pay | Admitting: Nurse Practitioner

## 2023-07-30 DIAGNOSIS — G629 Polyneuropathy, unspecified: Secondary | ICD-10-CM

## 2023-08-01 ENCOUNTER — Other Ambulatory Visit: Payer: Self-pay | Admitting: Family Medicine

## 2023-08-01 DIAGNOSIS — H5410 Blindness, one eye, low vision other eye, unspecified eyes: Secondary | ICD-10-CM

## 2023-08-03 NOTE — Telephone Encounter (Signed)
Patient medication has allergy contraindications. Medication pend and sent to PCP Venita Sheffield, MD for approval.

## 2023-08-05 ENCOUNTER — Encounter: Payer: Medicare Other | Admitting: Family Medicine

## 2023-08-07 ENCOUNTER — Encounter: Payer: Medicare Other | Admitting: Sports Medicine

## 2023-08-17 ENCOUNTER — Ambulatory Visit: Payer: Medicare Other

## 2023-08-17 DIAGNOSIS — G459 Transient cerebral ischemic attack, unspecified: Secondary | ICD-10-CM

## 2023-08-18 LAB — CUP PACEART REMOTE DEVICE CHECK
Date Time Interrogation Session: 20241021004100
Implantable Pulse Generator Implant Date: 20230309
Pulse Gen Serial Number: 172093

## 2023-08-21 ENCOUNTER — Telehealth: Payer: Self-pay | Admitting: Pharmacist

## 2023-08-21 NOTE — Progress Notes (Signed)
08/21/2023  Patient ID: Tina Mercado, female   DOB: [birth year], 89 y.o.   MRN: [id number]  Received a call from the patient today. Unknown how she found my phone number as I have never reached out to this patient. She asked why she received a call from this phone number a few days ago. Advised I do not see any reason listed in her chart and that I did not call her. However, I did verify her upcoming Cardiology appt on 09/01/23 and she wrote it down to remember.    Marlowe Aschoff, PharmD [facility] Phone Number: [phone]

## 2023-08-26 DIAGNOSIS — Z23 Encounter for immunization: Secondary | ICD-10-CM | POA: Diagnosis not present

## 2023-08-28 ENCOUNTER — Encounter: Payer: Self-pay | Admitting: Sports Medicine

## 2023-08-28 ENCOUNTER — Non-Acute Institutional Stay: Payer: Medicare Other | Admitting: Sports Medicine

## 2023-08-28 VITALS — BP 120/88 | HR 67 | Temp 97.4°F | Resp 16 | Ht 62.0 in | Wt 131.6 lb

## 2023-08-28 DIAGNOSIS — J309 Allergic rhinitis, unspecified: Secondary | ICD-10-CM

## 2023-08-28 DIAGNOSIS — G629 Polyneuropathy, unspecified: Secondary | ICD-10-CM

## 2023-08-28 DIAGNOSIS — K5900 Constipation, unspecified: Secondary | ICD-10-CM

## 2023-08-28 DIAGNOSIS — J449 Chronic obstructive pulmonary disease, unspecified: Secondary | ICD-10-CM

## 2023-08-28 DIAGNOSIS — N1832 Chronic kidney disease, stage 3b: Secondary | ICD-10-CM

## 2023-08-28 DIAGNOSIS — G459 Transient cerebral ischemic attack, unspecified: Secondary | ICD-10-CM
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
## 2023-08-30 NOTE — Progress Notes (Unsigned)
Cardiology Office Note:  .   Date:  09/01/2023  ID:  Tina Mercado, DOB 11-Feb-1929, MRN [id number] PCP: Tina Sheffield, MD  [facility] Providers Cardiologist: Tina Millers, MD }   }   History of Present Illness: .   Tina Mercado is a very pleasant 89 y.o. female we are following for ongoing assessment and management of hypertension, history of CVA in 2023 with no significant stenosis per carotid Dopplers in 2023, TIA in July 2023, hyperlipidemia, and mild to moderate mitral regurg.  She has ILR in situ  and is continued on aspirin and Plavix.  Last seen by Dr. Jens Mercado on 08/29/2022.  Most recent ILR interrogation revealed no arrhythmias, she had a normal histogram on review 08/23/2023 by Dr. Lalla Mercado.  She comes today without any cardiac complaints.  She denies any palpitations, dyspnea on exertion, chest pain, or worsening fatigue.  She uses a walker for ambulation.  She states that she was admitted for COPD exacerbation in May 2024 as result of construction going on around her living area at [facility].  She states she has been recently diagnosed with chronic kidney disease 3B and she is interested in being seen by a nephrologist.  She has had recent labs drawn by primary care provider at her living facility.  No medication changes were made.  She was to follow-up in 3 months for repeat labs.  She denies any bleeding, hemoptysis, or melena on clopidogrel.  ROS: As above otherwise negative  Studies Reviewed: Marland Kitchen    EKG Interpretation Date/Time:  Tuesday September 01 2023 11:12:31 EST Ventricular Rate:  66 PR Interval:  274 QRS Duration:  76 QT Interval:  390 QTC Calculation: 408 R Axis:   -6  Text Interpretation: Sinus rhythm with 1st degree A-V block Septal infarct , age undetermined When compared with ECG of 25-Feb-2023 07:47, PREVIOUS ECG IS PRESENT Confirmed by Tina Mercado [phone]) on 09/01/2023 12:06:21 PM    Physical Exam:   VS:  BP  (!) 120/90   Pulse 66   Ht 5\' 2"  (1.575 m)   Wt 126 lb (57.2 kg)   LMP  (LMP Unknown)   SpO2 96%   BMI 23.05 kg/m    Wt Readings from Last 3 Encounters:  09/01/23 126 lb (57.2 kg)  08/28/23 131 lb 9.6 oz (59.7 kg)  04/02/23 121 lb 9.6 oz (55.2 kg)    GEN: Well nourished, well developed in no acute distress NECK: No JVD; No carotid bruits CARDIAC: RRR, soft systolic murmur heard best at the left sternal border, murmurs, rubs, gallops RESPIRATORY:  Clear to auscultation without rales, wheezing or rhonchi  ABDOMEN: Soft, non-tender, non-distended EXTREMITIES:  No edema; No deformity   ASSESSMENT AND PLAN: .    Hypertension: Well-controlled, not on any antihypertensives at this time.  She is a walker for ambulation has not had any episodes of dizziness or excessive fatigue associated with activity.  2.  History of CVA: Continues on clopidogrel 75 mg daily without any bleeding issues.  She is steady and uses a walker for ambulation.  She has not had any falls.  No residual weakness.  No changes in her regimen.  3.  ILR in situ: No arrhythmias were noted on recent download on 08/23/2023.   4.  Hypercholesterolemia: Remains on atorvastatin 40 mg daily.  Labs are followed by PCP.  5.  History of COPD: Exacerbation over the summer during construction near her home causing a lot of dust and  debris.  She remains on albuterol inhaler.           Signed, Tina Mercado. Tina Mercado, ANP, AACC

## 2023-09-01 ENCOUNTER — Encounter: Payer: Self-pay | Admitting: Adult Health

## 2023-09-01 ENCOUNTER — Ambulatory Visit: Payer: Medicare Other | Attending: Adult Health | Admitting: Adult Health

## 2023-09-01 VITALS — BP 120/90 | HR 66 | Ht 62.0 in | Wt 126.0 lb

## 2023-09-01 DIAGNOSIS — E7800 Pure hypercholesterolemia, unspecified: Secondary | ICD-10-CM | POA: Diagnosis not present

## 2023-09-01 DIAGNOSIS — J449 Chronic obstructive pulmonary disease, unspecified: Secondary | ICD-10-CM | POA: Diagnosis not present

## 2023-09-01 DIAGNOSIS — I639 Cerebral infarction, unspecified: Secondary | ICD-10-CM | POA: Insufficient documentation

## 2023-09-01 DIAGNOSIS — I10 Essential (primary) hypertension: Secondary | ICD-10-CM | POA: Diagnosis not present

## 2023-09-01 NOTE — Patient Instructions (Signed)
Medication Instructions:  No Changes *If you need a refill on your cardiac medications before your next appointment, please call your pharmacy*   Lab Work: No Labs If you have labs (blood work) drawn today and your tests are completely normal, you will receive your results only by: MyChart Message (if you have MyChart) OR A paper copy in the mail If you have any lab test that is abnormal or we need to change your treatment, we will call you to review the results.   Testing/Procedures: No Testing   Follow-Up: At [facility], you and your health needs are our priority.  As part of our continuing mission to provide you with exceptional heart care, we have created designated Provider Care Teams.  These Care Teams include your primary Cardiologist (physician) and Advanced Practice Providers (APPs -  Physician Assistants and Nurse Practitioners) who all work together to provide you with the care you need, when you need it.  We recommend signing up for the patient portal called "MyChart".  Sign up information is provided on this After Visit Summary.  MyChart is used to connect with patients for Virtual Visits (Telemedicine).  Patients are able to view lab/test results, encounter notes, upcoming appointments, etc.  Non-urgent messages can be sent to your provider as well.   To learn more about what you can do with MyChart, go to [email or web address].    Your next appointment:   6 month(s)  Provider:   Brian Crenshaw, MD   

## 2023-09-02 NOTE — Progress Notes (Signed)
[place] [facility] Recorder 

## 2023-09-10 ENCOUNTER — Telehealth: Payer: Self-pay

## 2023-09-10 DIAGNOSIS — N1832 Chronic kidney disease, stage 3b: Secondary | ICD-10-CM

## 2023-09-10 NOTE — Telephone Encounter (Signed)
Rosey Bath, Independent nurse for [facility] stopped by the clinic on patients behalf  1.) Patient needs a referral to the nephrologist due to stage 3 kidney disease   2.) Patient needs an alternative to [place], as her insurance will no longer cover   Please advise

## 2023-09-11 MED ORDER — FLUTICASONE-SALMETEROL 100-50 MCG/ACT IN AEPB: 1.0000 | INHALATION_SPRAY | Freq: Two times a day (BID) | RESPIRATORY_TRACT | 3 refills | Status: DC

## 2023-09-11 NOTE — Telephone Encounter (Signed)
Tina Sheffield, MD  You; [facility]38 minutes ago (12:17 PM)    Will switch to advair Referral placed.  Patient is aware of response. Patient states she tried advair before and had a bad experience. Patient is asking for something different   Patient is also asking if she can have a refill on Clotrimazole (was not on medication list, I added)

## 2023-09-11 NOTE — Telephone Encounter (Signed)
Tina Sheffield, MD  [facility]9 minutes ago (3:50 PM)    Can you check if she can take symbicort instead,  I pended the result , let me know    Patient notified and stated that she will try it and would like it sent to her pharmacy.

## 2023-09-14 MED ORDER — BUDESONIDE-FORMOTEROL FUMARATE 80-4.5 MCG/ACT IN AERO: 2.0000 | INHALATION_SPRAY | Freq: Two times a day (BID) | RESPIRATORY_TRACT | 3 refills | Status: DC

## 2023-09-17 ENCOUNTER — Ambulatory Visit (INDEPENDENT_AMBULATORY_CARE_PROVIDER_SITE_OTHER): Payer: Medicare Other

## 2023-09-17 DIAGNOSIS — G459 Transient cerebral ischemic attack, unspecified: Secondary | ICD-10-CM

## 2023-09-21 LAB — CUP PACEART REMOTE DEVICE CHECK
Date Time Interrogation Session: 20241125004600
Implantable Pulse Generator Implant Date: 20230309
Pulse Gen Serial Number: 172093

## 2023-09-27 ENCOUNTER — Other Ambulatory Visit: Payer: Self-pay | Admitting: Nurse Practitioner

## 2023-09-28 NOTE — Telephone Encounter (Signed)
High risk or very high risk warning populated when attempting to refill medication. RX request sent to PCP for review and approval if warranted.

## 2023-10-13 NOTE — Progress Notes (Signed)
Carelink Summary Report / Loop Recorder 

## 2023-10-13 NOTE — Addendum Note (Signed)
Addended by: Elease Etienne A on: 10/13/2023 02:55 PM   Modules accepted: Orders

## 2023-10-16 ENCOUNTER — Encounter (HOSPITAL_COMMUNITY): Payer: Self-pay | Admitting: Emergency Medicine

## 2023-10-16 ENCOUNTER — Emergency Department (HOSPITAL_COMMUNITY)
Admission: EM | Admit: 2023-10-16 | Discharge: 2023-10-17 | Disposition: A | Discharge status: Home / Self Care | Payer: Medicare Other | Attending: Emergency Medicine | Admitting: Emergency Medicine

## 2023-10-16 ENCOUNTER — Other Ambulatory Visit: Payer: Self-pay

## 2023-10-16 DIAGNOSIS — Z7982 Long term (current) use of aspirin: Secondary | ICD-10-CM | POA: Insufficient documentation

## 2023-10-16 DIAGNOSIS — R197 Diarrhea, unspecified: Secondary | ICD-10-CM | POA: Diagnosis not present

## 2023-10-16 DIAGNOSIS — Z20822 Contact with and (suspected) exposure to covid-19: Secondary | ICD-10-CM | POA: Diagnosis not present

## 2023-10-16 DIAGNOSIS — R112 Nausea with vomiting, unspecified: Secondary | ICD-10-CM | POA: Diagnosis not present

## 2023-10-16 DIAGNOSIS — R110 Nausea: Secondary | ICD-10-CM | POA: Diagnosis not present

## 2023-10-16 DIAGNOSIS — Z7951 Long term (current) use of inhaled steroids: Secondary | ICD-10-CM | POA: Insufficient documentation

## 2023-10-16 DIAGNOSIS — T68XXXA Hypothermia, initial encounter: Secondary | ICD-10-CM | POA: Diagnosis not present

## 2023-10-16 DIAGNOSIS — J449 Chronic obstructive pulmonary disease, unspecified: Secondary | ICD-10-CM | POA: Insufficient documentation

## 2023-10-16 DIAGNOSIS — Z79899 Other long term (current) drug therapy: Secondary | ICD-10-CM | POA: Insufficient documentation

## 2023-10-16 DIAGNOSIS — I10 Essential (primary) hypertension: Secondary | ICD-10-CM | POA: Diagnosis not present

## 2023-10-16 DIAGNOSIS — G4489 Other headache syndrome: Secondary | ICD-10-CM | POA: Diagnosis not present

## 2023-10-16 LAB — LIPASE, BLOOD: Lipase: 39 U/L (ref 11–51)

## 2023-10-16 LAB — RESP PANEL BY RT-PCR (RSV, FLU A&B, COVID)  RVPGX2
Influenza A by PCR: NEGATIVE
Influenza B by PCR: NEGATIVE
Resp Syncytial Virus by PCR: NEGATIVE
SARS Coronavirus 2 by RT PCR: NEGATIVE

## 2023-10-16 LAB — CBC WITH DIFFERENTIAL/PLATELET
Abs Immature Granulocytes: 0.05 10*3/uL (ref 0.00–0.07)
Basophils Absolute: 0 10*3/uL (ref 0.0–0.1)
Basophils Relative: 0 %
Eosinophils Absolute: 0.2 10*3/uL (ref 0.0–0.5)
Eosinophils Relative: 1 %
HCT: 43.3 % (ref 36.0–46.0)
Hemoglobin: 13.5 g/dL (ref 12.0–15.0)
Immature Granulocytes: 0 %
Lymphocytes Relative: 3 %
Lymphs Abs: 0.4 10*3/uL — ABNORMAL LOW (ref 0.7–4.0)
MCH: 26.5 pg (ref 26.0–34.0)
MCHC: 31.2 g/dL (ref 30.0–36.0)
MCV: 85.1 fL (ref 80.0–100.0)
Monocytes Absolute: 0.2 10*3/uL (ref 0.1–1.0)
Monocytes Relative: 2 %
Neutro Abs: 10.6 10*3/uL — ABNORMAL HIGH (ref 1.7–7.7)
Neutrophils Relative %: 94 %
Platelets: 195 10*3/uL (ref 150–400)
RBC: 5.09 MIL/uL (ref 3.87–5.11)
RDW: 15.9 % — ABNORMAL HIGH (ref 11.5–15.5)
WBC: 11.4 10*3/uL — ABNORMAL HIGH (ref 4.0–10.5)
nRBC: 0 % (ref 0.0–0.2)

## 2023-10-16 LAB — COMPREHENSIVE METABOLIC PANEL
ALT: 10 U/L (ref 0–44)
AST: 20 U/L (ref 15–41)
Albumin: 3.6 g/dL (ref 3.5–5.0)
Alkaline Phosphatase: 86 U/L (ref 38–126)
Anion gap: 10 (ref 5–15)
BUN: 18 mg/dL (ref 8–23)
CO2: 19 mmol/L — ABNORMAL LOW (ref 22–32)
Calcium: 9.3 mg/dL (ref 8.9–10.3)
Chloride: 109 mmol/L (ref 98–111)
Creatinine, Ser: 1.16 mg/dL — ABNORMAL HIGH (ref 0.44–1.00)
GFR, Estimated: 44 mL/min — ABNORMAL LOW (ref >60)
Glucose, Bld: 113 mg/dL — ABNORMAL HIGH (ref 70–99)
Potassium: 3.9 mmol/L (ref 3.5–5.1)
Sodium: 138 mmol/L (ref 135–145)
Total Bilirubin: 1 mg/dL (ref <1.2)
Total Protein: 7 g/dL (ref 6.5–8.1)

## 2023-10-16 LAB — MAGNESIUM: Magnesium: 2.1 mg/dL (ref 1.7–2.4)

## 2023-10-16 MED ORDER — ONDANSETRON HCL 4 MG/2ML IJ SOLN
4.0000 mg | Freq: Once | INTRAMUSCULAR | Status: AC
  Administered 2023-10-16: 4 mg via INTRAVENOUS
  Filled 2023-10-16: qty 2

## 2023-10-16 MED ORDER — SODIUM CHLORIDE 0.9 % IV BOLUS
1000.0000 mL | Freq: Once | INTRAVENOUS | Status: AC
  Administered 2023-10-16: 1000 mL via INTRAVENOUS

## 2023-10-16 NOTE — ED Notes (Signed)
UA HAS NOT BEEN COLLECTED. WILL COLLECT ASAP

## 2023-10-16 NOTE — ED Notes (Signed)
Pt is on bed pan, will call when finished

## 2023-10-16 NOTE — ED Provider Notes (Signed)
EMERGENCY DEPARTMENT AT [facility] Provider Note   CSN: [id number] Arrival date & time: 10/16/23  1759     History  No chief complaint on file.   Tina Mercado is a 89 y.o. female.  Patient to ED with 3 days of nausea and dry heaving, with diarrhea that is nonbloody. She is here from [facility] and reports several residents ill with similar symptoms after eating shrimp 3 days ago that she felt was undercooked. She denies fever. Denies urinary symptoms.   The history is provided by the patient. No language interpreter was used.       Home Medications Prior to Admission medications   Medication Sig Start Date End Date Taking? Authorizing Provider  albuterol (VENTOLIN HFA) 108 (90 Base) MCG/ACT inhaler Inhale 2 puffs into the lungs every 6 (six) hours as needed for wheezing or shortness of breath. 04/03/23   Mast, Man X, NP  amitriptyline (ELAVIL) 10 MG tablet TAKE 1 TABLET BY MOUTH EVERYDAY AT BEDTIME 04/03/23   Mast, Man X, NP  aspirin EC 81 MG tablet Take 1 tablet (81 mg total) by mouth daily. Swallow whole. 05/06/22   Almon Hercules, MD  bisacodyl (DULCOLAX) 10 MG suppository Place 1 suppository (10 mg total) rectally daily as needed for moderate constipation. 04/03/23   Mast, Man X, NP  budesonide-formoterol (SYMBICORT) 80-4.5 MCG/ACT inhaler Inhale 2 puffs into the lungs 2 (two) times daily. 09/14/23   Venita Sheffield, MD  Cholecalciferol (VITAMIN D3) 50 MCG (2000 UT) TABS TAKE 1 TABLET (2,000 UNITS TOTAL) BY MOUTH AT BEDTIME. 05/08/23   Frederica Kuster, MD  clopidogrel (PLAVIX) 75 MG tablet Take 1 tablet (75 mg total) by mouth daily. 04/03/23   Mast, Man X, NP  clotrimazole (MYCELEX) 10 MG troche Take 10 mg by mouth in the morning, at noon, in the evening, and at bedtime.    [provider]  docusate sodium (COLACE) 100 MG capsule Take 1-2 capsules (100-200 mg total) by mouth See admin instructions. Take 100-200 mg by mouth  at bedtime and HOLD FOR DIARRHEA 04/03/23   Mast, Man X, NP  fluticasone-salmeterol (ADVAIR) 100-50 MCG/ACT AEPB Inhale 1 puff into the lungs 2 (two) times daily. 09/11/23   Venita Sheffield, MD  ketorolac (ACULAR) 0.5 % ophthalmic solution INSTILL 1 DROP INTO RIGHT EYE TWICE A DAY 08/03/23   Venita Sheffield, MD  rosuvastatin (CRESTOR) 40 MG tablet TAKE 1 TABLET BY MOUTH EVERY DAY 09/29/23   Venita Sheffield, MD  timolol (TIMOPTIC) 0.5 % ophthalmic solution Place 1 drop into both eyes 2 (two) times daily. 04/03/23   Mast, Man X, NP      Allergies    Contrast media [iodinated contrast media]; Oscal [oyster shell calcium-d]; Wixela inhub [fluticasone-salmeterol]; Antihistamines, diphenhydramine-type; Celecoxib; Clemastine fumarate; Darvocet [propoxyphene n-acetaminophen]; Diphenhydramine hcl; Feldene [piroxicam]; Gabapentin; Meperidine; Norvasc [amlodipine besylate]; Other; Sulfa antibiotics; Tea; and Iodine-131    Review of Systems   Review of Systems  Physical Exam Updated Vital Signs BP (!) 154/62 (BP Location: Right Arm)   Pulse 94   Temp 97.8 F (36.6 C) (Oral)   Resp 19   LMP  (LMP Unknown)   SpO2 98%  Physical Exam Vitals and nursing note reviewed.  Constitutional:      General: She is not in acute distress.    Appearance: Normal appearance. She is not ill-appearing.  HENT:     Mouth/Throat:     Mouth: Mucous membranes are dry.  Cardiovascular:  Rate and Rhythm: Normal rate and regular rhythm.     Heart sounds: No murmur heard. Pulmonary:     Effort: Pulmonary effort is normal.     Breath sounds: No wheezing, rhonchi or rales.  Abdominal:     General: There is no distension.     Palpations: Abdomen is soft.     Tenderness: There is no abdominal tenderness.  Musculoskeletal:        General: Normal range of motion.     Cervical back: Normal range of motion and neck supple.  Skin:    General: Skin is warm and dry.  Neurological:     Mental Status: She is  alert and oriented to person, place, and time.     ED Results / Procedures / Treatments   Labs (all labs ordered are listed, but only abnormal results are displayed) Labs Reviewed  CBC WITH DIFFERENTIAL/PLATELET - Abnormal; Notable for the following components:      Result Value   WBC 11.4 (*)    RDW 15.9 (*)    Neutro Abs 10.6 (*)    Lymphs Abs 0.4 (*)    All other components within normal limits  COMPREHENSIVE METABOLIC PANEL - Abnormal; Notable for the following components:   CO2 19 (*)    Glucose, Bld 113 (*)    Creatinine, Ser 1.16 (*)    GFR, Estimated 44 (*)    All other components within normal limits  RESP PANEL BY RT-PCR (RSV, FLU A&B, COVID)  RVPGX2  LIPASE, BLOOD  MAGNESIUM  URINALYSIS, ROUTINE W REFLEX MICROSCOPIC   Results for orders placed or performed during the hospital encounter of 10/16/23  CBC with Differential   Collection Time: 10/16/23  6:41 PM  Result Value Ref Range   WBC 11.4 (H) 4.0 - 10.5 K/uL   RBC 5.09 3.87 - 5.11 MIL/uL   Hemoglobin 13.5 12.0 - 15.0 g/dL   HCT 81.1 91.4 - 78.2 %   MCV 85.1 80.0 - 100.0 fL   MCH 26.5 26.0 - 34.0 pg   MCHC 31.2 30.0 - 36.0 g/dL   RDW 95.6 (H) 21.3 - 08.6 %   Platelets 195 150 - 400 K/uL   nRBC 0.0 0.0 - 0.2 %   Neutrophils Relative % 94 %   Neutro Abs 10.6 (H) 1.7 - 7.7 K/uL   Lymphocytes Relative 3 %   Lymphs Abs 0.4 (L) 0.7 - 4.0 K/uL   Monocytes Relative 2 %   Monocytes Absolute 0.2 0.1 - 1.0 K/uL   Eosinophils Relative 1 %   Eosinophils Absolute 0.2 0.0 - 0.5 K/uL   Basophils Relative 0 %   Basophils Absolute 0.0 0.0 - 0.1 K/uL   Immature Granulocytes 0 %   Abs Immature Granulocytes 0.05 0.00 - 0.07 K/uL  Comprehensive metabolic panel   Collection Time: 10/16/23  6:41 PM  Result Value Ref Range   Sodium 138 135 - 145 mmol/L   Potassium 3.9 3.5 - 5.1 mmol/L   Chloride 109 98 - 111 mmol/L   CO2 19 (L) 22 - 32 mmol/L   Glucose, Bld 113 (H) 70 - 99 mg/dL   BUN 18 8 - 23 mg/dL   Creatinine, Ser  5.78 (H) 0.44 - 1.00 mg/dL   Calcium 9.3 8.9 - 46.9 mg/dL   Total Protein 7.0 6.5 - 8.1 g/dL   Albumin 3.6 3.5 - 5.0 g/dL   AST 20 15 - 41 U/L   ALT 10 0 - 44 U/L   Alkaline  Phosphatase 86 38 - 126 U/L   Total Bilirubin 1.0 <1.2 mg/dL   GFR, Estimated 44 (L) >60 mL/min   Anion gap 10 5 - 15  Lipase, blood   Collection Time: 10/16/23  6:41 PM  Result Value Ref Range   Lipase 39 11 - 51 U/L  Magnesium   Collection Time: 10/16/23  6:41 PM  Result Value Ref Range   Magnesium 2.1 1.7 - 2.4 mg/dL  Resp panel by RT-PCR (RSV, Flu A&B, Covid) Anterior Nasal Swab   Collection Time: 10/16/23  9:57 PM   Specimen: Anterior Nasal Swab  Result Value Ref Range   SARS Coronavirus 2 by RT PCR NEGATIVE NEGATIVE   Influenza A by PCR NEGATIVE NEGATIVE   Influenza B by PCR NEGATIVE NEGATIVE   Resp Syncytial Virus by PCR NEGATIVE NEGATIVE     EKG None  Radiology No results found.  Procedures Procedures    Medications Ordered in ED Medications  sodium chloride 0.9 % bolus 1,000 mL (1,000 mLs Intravenous Bolus 10/16/23 2126)  ondansetron (ZOFRAN) injection 4 mg (4 mg Intravenous Given 10/16/23 2127)    ED Course/ Medical Decision Making/ A&P Clinical Course as of 10/17/23 0003  Fri Oct 16, 2023  3328 89 year old female resident at friend[facility] home here with nausea vomiting.  Sounds like a few other residents had similar symptoms.  She is getting some lab work and some hydration.  Will need reassessment and possible return back to her facility. [MB]    Clinical Course User Index [MB] Terrilee Files, MD                                 Medical Decision Making This patient presents to the ED for concern of vomiting and diarrhea, this involves an extensive number of treatment options, and is a complaint that carries with it a high risk of complications and morbidity.  The differential diagnosis includes colitis, diverticulitis, viral gastroenteritis, food poisoning    Co morbidities that  complicate the patient evaluation  Advanced age, COPD, HTN, HLD   Additional history obtained:  Additional history and/or information obtained from chart review, notable for N/A   Lab Tests:  I Ordered, and personally interpreted labs.  The pertinent results include:  WBC 11.4, CO2 19, Cr 1.16    Medicines ordered and prescription drug management:  I ordered medication including Zofran  for nausea Reevaluation of the patient after these medicines showed that the patient improved I have reviewed the patients home medicines and have made adjustments as needed   Test Considered:  Likely here with nontoxic GI symptoms after tainted food making several facility residents ill with diarrhea and vomiting Has a cough, COPD history - viral panel added Has received fluids Will check urinalysis   Problem List / ED Course:  Likely here with nontoxic GI symptoms after tainted food making several facility residents ill with diarrhea and vomiting Has a cough, COPD history - viral panel added Has received fluids Will check urinalysis    Social Determinants of Health:  Independent living facility; spry 89 yo   Disposition:  After consideration of the diagnostic results and the patients response to treatment, I feel that the patient would benefit from   Disposition pending at time of sign out to Sharilyn Sites, PA-C. UA needs to be reviewed prior to anticipated discharge. She is drinking in the ED without vomiting.   Amount and/or Complexity of  Data Reviewed Labs: ordered.           Final Clinical Impression(s) / ED Diagnoses Final diagnoses:  None    Rx / DC Orders ED Discharge Orders     None         Danne Harbor 10/17/23 0003    Terrilee Files, MD 10/17/23 1154

## 2023-10-16 NOTE — ED Notes (Signed)
[phone] Jewel Baize, pt caretaker called for a update.

## 2023-10-16 NOTE — ED Provider Triage Note (Signed)
Emergency Medicine Provider Triage Evaluation Note  Tina Mercado , a 89 y.o. female  was evaluated in triage.  Pt complains of nausea and diarrhea.  Going on for about 12 to 24 hours.  Someone that she lives with has similar symptoms and also came to the ED to be evaluated.  She denies cough congestion or difficulty breathing..  Review of Systems  Positive: N/diarrhea Negative: Abdominal pain, urinary symptoms, cough, sob  Physical Exam  BP (!) 146/66 (BP Location: Right Arm)   Pulse 81   Temp 98.3 F (36.8 C) (Oral)   Resp 20   LMP  (LMP Unknown)   SpO2 99%  Gen:   Awake, no distress   Resp:  Normal effort  MSK:   Moves extremities without difficulty  Other:  Benign abdominal exam  Medical Decision Making  Medically screening exam initiated at 6:40 PM.  Appropriate orders placed.  [name] was informed that the remainder of the evaluation will be completed by another provider, this initial triage assessment does not replace that evaluation, and the importance of remaining in the ED until their evaluation is complete.  89 year old female with COPD with multiple admissions for COPD exacerbation comes in with a chief complaints of nausea and diarrhea.  Likely viral syndrome.  Will check lab work.  Bolus of IV fluids.  Antiemetics.   Melene Plan, DO 10/16/23 [phone]

## 2023-10-16 NOTE — ED Triage Notes (Signed)
PT BIB [facility] from [facility] for nausea/3 episodes diarrhea since 1400 today.  Pt has chills, HA for a few days. Pt felt lightheaded moving from bathroom to living room.    158/69 HR 82 RR 20 96% RA CBG 148 Temp 97

## 2023-10-17 DIAGNOSIS — R197 Diarrhea, unspecified: Secondary | ICD-10-CM | POA: Diagnosis not present

## 2023-10-17 LAB — URINALYSIS, ROUTINE W REFLEX MICROSCOPIC
Bacteria, UA: NONE SEEN
Bilirubin Urine: NEGATIVE
Glucose, UA: NEGATIVE mg/dL
Ketones, ur: NEGATIVE mg/dL
Leukocytes,Ua: NEGATIVE
Nitrite: NEGATIVE
Protein, ur: 30 mg/dL — AB
Specific Gravity, Urine: 1.015 (ref 1.005–1.030)
pH: 5 (ref 5.0–8.0)

## 2023-10-17 NOTE — ED Provider Notes (Signed)
  Assumed care at shift change.  See prior note for full H&P.  Briefly, 89 year old female presenting to the ED with nausea, dry heaving, and diarrhea.  Apparently several residents sick with similar after eating shrimp.  She has not had any vomiting or active diarrhea here.  Labs with minimal leukocytosis, no electrolyte derangement.  Renal function is at baseline.  RVP is negative.  Plan: Awaiting UA.  P.o. trial.  Anticipate discharge back to facility.  UA without signs of infection.  Remains without vomiting or diarrhea here.  Has tolerated PO.  Appropriate for discharge back to facility.  Close PCP follow-up.  Return here for new concerns.   Garlon Hatchet, PA-C 10/17/23 4098    Nira Conn, MD 10/17/23 [phone]

## 2023-10-17 NOTE — Discharge Instructions (Signed)
Labs today reassuring. Symptoms seem related to food in take.  No vomiting/diarrhea in the ED. Follow-up closely with PCP. Return here for new concerns.

## 2023-10-19 ENCOUNTER — Ambulatory Visit: Payer: Medicare Other

## 2023-10-19 DIAGNOSIS — G459 Transient cerebral ischemic attack, unspecified: Secondary | ICD-10-CM

## 2023-10-19 LAB — CUP PACEART REMOTE DEVICE CHECK
Date Time Interrogation Session: 20241223015000
Implantable Pulse Generator Implant Date: 20230309
Pulse Gen Serial Number: 172093

## 2023-10-23 ENCOUNTER — Other Ambulatory Visit: Payer: Self-pay | Admitting: Nurse Practitioner

## 2023-10-23 DIAGNOSIS — H409 Unspecified glaucoma: Secondary | ICD-10-CM

## 2023-10-23 NOTE — Telephone Encounter (Signed)
High Risk Warning Populated when attempting to refill, I will send to Provider for further review 

## 2023-11-05 ENCOUNTER — Encounter: Payer: Self-pay | Admitting: Nurse Practitioner

## 2023-11-05 ENCOUNTER — Non-Acute Institutional Stay: Payer: Medicare Other | Admitting: Nurse Practitioner

## 2023-11-05 VITALS — BP 130/82 | HR 68 | Temp 97.9°F | Resp 17

## 2023-11-05 DIAGNOSIS — R42 Dizziness and giddiness: Secondary | ICD-10-CM | POA: Diagnosis not present

## 2023-11-05 DIAGNOSIS — I639 Cerebral infarction, unspecified: Secondary | ICD-10-CM | POA: Diagnosis not present

## 2023-11-05 DIAGNOSIS — I10 Essential (primary) hypertension: Secondary | ICD-10-CM | POA: Diagnosis not present

## 2023-11-05 DIAGNOSIS — J449 Chronic obstructive pulmonary disease, unspecified: Secondary | ICD-10-CM

## 2023-11-05 DIAGNOSIS — N1831 Chronic kidney disease, stage 3a: Secondary | ICD-10-CM | POA: Diagnosis not present

## 2023-11-05 DIAGNOSIS — K449 Diaphragmatic hernia without obstruction or gangrene: Secondary | ICD-10-CM | POA: Diagnosis not present

## 2023-11-05 NOTE — Assessment & Plan Note (Signed)
Stroke/TIA, takes Plavix, ASA, Rosuvastatin

## 2023-11-05 NOTE — Assessment & Plan Note (Signed)
 Hospitalized 02/25/23-02/28/23 for COPD exacerbation, fully treated with inhalers, antibiotics, and steroids.

## 2023-11-05 NOTE — Assessment & Plan Note (Signed)
controlled blood pressure w/o meds.

## 2023-11-05 NOTE — Assessment & Plan Note (Signed)
 C/o dizziness, Ortho Bp, no orthostatic hypotension, or focal neurological symptoms.  CBC/diff, CMP/eGFR, CT head/cervical spine.

## 2023-11-05 NOTE — Assessment & Plan Note (Signed)
 Bun/creat 18/1.16 10/16/23
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
## 2023-11-05 NOTE — Assessment & Plan Note (Signed)
Stable,  takes Famotidine ?

## 2023-11-09 ENCOUNTER — Encounter: Payer: Self-pay | Admitting: Nurse Practitioner

## 2023-11-10 ENCOUNTER — Other Ambulatory Visit: Payer: Medicare Other

## 2023-11-10 ENCOUNTER — Telehealth: Payer: Self-pay | Admitting: *Deleted

## 2023-11-10 NOTE — Telephone Encounter (Signed)
 Tina Mercado with [facility] Called requesting patient's lab orders to be faxed to her to [phone]  Lab Orders faxed as requested.

## 2023-11-11 DIAGNOSIS — R42 Dizziness and giddiness: Secondary | ICD-10-CM | POA: Diagnosis not present

## 2023-11-12 ENCOUNTER — Encounter: Payer: Self-pay | Admitting: Nurse Practitioner

## 2023-11-12 ENCOUNTER — Non-Acute Institutional Stay: Payer: Medicare Other | Admitting: Nurse Practitioner

## 2023-11-12 VITALS — BP 130/70 | HR 67 | Temp 97.3°F

## 2023-11-12 DIAGNOSIS — R42 Dizziness and giddiness: Secondary | ICD-10-CM | POA: Diagnosis not present

## 2023-11-12 DIAGNOSIS — N1831 Chronic kidney disease, stage 3a: Secondary | ICD-10-CM

## 2023-11-12 DIAGNOSIS — R1310 Dysphagia, unspecified: Secondary | ICD-10-CM | POA: Diagnosis not present

## 2023-11-12 DIAGNOSIS — D649 Anemia, unspecified: Secondary | ICD-10-CM | POA: Diagnosis not present

## 2023-11-12 DIAGNOSIS — K449 Diaphragmatic hernia without obstruction or gangrene: Secondary | ICD-10-CM

## 2023-11-12 DIAGNOSIS — I10 Essential (primary) hypertension: Secondary | ICD-10-CM | POA: Diagnosis not present

## 2023-11-12 DIAGNOSIS — K219 Gastro-esophageal reflux disease without esophagitis: Secondary | ICD-10-CM

## 2023-11-12 DIAGNOSIS — G459 Transient cerebral ischemic attack, unspecified: Secondary | ICD-10-CM

## 2023-11-12 DIAGNOSIS — G629 Polyneuropathy, unspecified: Secondary | ICD-10-CM | POA: Diagnosis not present

## 2023-11-12 LAB — CBC WITH DIFFERENTIAL/PLATELET
Absolute Lymphocytes: 959 {cells}/uL (ref 850–3900)
Absolute Monocytes: 455 {cells}/uL (ref 200–950)
Basophils Absolute: 41 {cells}/uL (ref 0–200)
Basophils Relative: 0.6 %
Eosinophils Absolute: 262 {cells}/uL (ref 15–500)
Eosinophils Relative: 3.8 %
HCT: 39.7 % (ref 35.0–45.0)
Hemoglobin: 12.2 g/dL (ref 11.7–15.5)
MCH: 26.4 pg — ABNORMAL LOW (ref 27.0–33.0)
MCHC: 30.7 g/dL — ABNORMAL LOW (ref 32.0–36.0)
MCV: 85.9 fL (ref 80.0–100.0)
MPV: 9.6 fL (ref 7.5–12.5)
Monocytes Relative: 6.6 %
Neutro Abs: 5182 {cells}/uL (ref 1500–7800)
Neutrophils Relative %: 75.1 %
Platelets: 172 10*3/uL (ref 140–400)
RBC: 4.62 10*6/uL (ref 3.80–5.10)
RDW: 15.7 % — ABNORMAL HIGH (ref 11.0–15.0)
Total Lymphocyte: 13.9 %
WBC: 6.9 10*3/uL (ref 3.8–10.8)

## 2023-11-12 LAB — COMPLETE METABOLIC PANEL WITH GFR
AG Ratio: 1.6 (calc) (ref 1.0–2.5)
ALT: 9 U/L (ref 6–29)
AST: 18 U/L (ref 10–35)
Albumin: 3.9 g/dL (ref 3.6–5.1)
Alkaline phosphatase (APISO): 86 U/L (ref 37–153)
BUN/Creatinine Ratio: 12 (calc) (ref 6–22)
BUN: 13 mg/dL (ref 7–25)
CO2: 21 mmol/L (ref 20–32)
Calcium: 9.2 mg/dL (ref 8.6–10.4)
Chloride: 108 mmol/L (ref 98–110)
Creat: 1.05 mg/dL — ABNORMAL HIGH (ref 0.60–0.95)
Globulin: 2.4 g/dL (ref 1.9–3.7)
Glucose, Bld: 106 mg/dL — ABNORMAL HIGH (ref 65–99)
Potassium: 4 mmol/L (ref 3.5–5.3)
Sodium: 139 mmol/L (ref 135–146)
Total Bilirubin: 0.5 mg/dL (ref 0.2–1.2)
Total Protein: 6.3 g/dL (ref 6.1–8.1)
eGFR: 50 mL/min/{1.73_m2} — ABNORMAL LOW (ref >=60)

## 2023-11-12 NOTE — Assessment & Plan Note (Addendum)
Very occasional heart burns, alleviated with Tums, restart Famotidine

## 2023-11-12 NOTE — Assessment & Plan Note (Signed)
Better, no further occurrence.  c/o dizziness, an episode 2-3 weeks ago when standing up from computer desk, resolved after sitting down, negative Ortho Bp change, no orthostatic hypotension, or focal neurological symptoms. C/o balance issue, feels unsteady when turning her head when walking with Rolator. Hx of TIA, stroke. Pending CT head 11/18/23

## 2023-11-12 NOTE — Assessment & Plan Note (Addendum)
GDR Amitriptyline at night

## 2023-11-12 NOTE — Assessment & Plan Note (Addendum)
Bun/creat 13/1.06 11/11/23

## 2023-11-12 NOTE — Assessment & Plan Note (Signed)
Hgb 12,2 1.15.25

## 2023-11-12 NOTE — Assessment & Plan Note (Signed)
Stroke/TIA, takes Plavix, ASA, Rosuvastatin
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
## 2023-11-12 NOTE — Assessment & Plan Note (Signed)
Difficulty swallowing pills, ST to eval and tx

## 2023-11-12 NOTE — Assessment & Plan Note (Signed)
controlled blood pressure w/o meds.

## 2023-11-13 ENCOUNTER — Encounter: Payer: Self-pay | Admitting: Nurse Practitioner

## 2023-11-17 DIAGNOSIS — I129 Hypertensive chronic kidney disease with stage 1 through stage 4 chronic kidney disease, or unspecified chronic kidney disease: Secondary | ICD-10-CM | POA: Diagnosis not present

## 2023-11-17 DIAGNOSIS — N1832 Chronic kidney disease, stage 3b: Secondary | ICD-10-CM | POA: Diagnosis not present

## 2023-11-17 DIAGNOSIS — G459 Transient cerebral ischemic attack, unspecified: Secondary | ICD-10-CM | POA: Diagnosis not present

## 2023-11-18 ENCOUNTER — Ambulatory Visit (HOSPITAL_COMMUNITY)
Admission: RE | Admit: 2023-11-18 | Discharge: 2023-11-18 | Disposition: A | Discharge status: Home / Self Care | Payer: Medicare Other | Source: Ambulatory Visit | Attending: Nurse Practitioner | Admitting: Nurse Practitioner

## 2023-11-18 DIAGNOSIS — M6281 Muscle weakness (generalized): Secondary | ICD-10-CM | POA: Diagnosis not present

## 2023-11-18 DIAGNOSIS — Z9189 Other specified personal risk factors, not elsewhere classified: Secondary | ICD-10-CM | POA: Diagnosis not present

## 2023-11-18 DIAGNOSIS — R42 Dizziness and giddiness: Secondary | ICD-10-CM | POA: Insufficient documentation

## 2023-11-18 DIAGNOSIS — R29898 Other symptoms and signs involving the musculoskeletal system: Secondary | ICD-10-CM | POA: Diagnosis not present

## 2023-11-18 DIAGNOSIS — R2689 Other abnormalities of gait and mobility: Secondary | ICD-10-CM | POA: Diagnosis not present

## 2023-11-18 DIAGNOSIS — R1312 Dysphagia, oropharyngeal phase: Secondary | ICD-10-CM | POA: Diagnosis not present

## 2023-11-19 ENCOUNTER — Encounter: Payer: Medicare Other | Admitting: Nurse Practitioner

## 2023-11-19 ENCOUNTER — Ambulatory Visit (INDEPENDENT_AMBULATORY_CARE_PROVIDER_SITE_OTHER): Payer: Medicare Other

## 2023-11-19 DIAGNOSIS — I639 Cerebral infarction, unspecified: Secondary | ICD-10-CM | POA: Diagnosis not present

## 2023-11-19 DIAGNOSIS — R2689 Other abnormalities of gait and mobility: Secondary | ICD-10-CM | POA: Diagnosis not present

## 2023-11-19 DIAGNOSIS — M6281 Muscle weakness (generalized): Secondary | ICD-10-CM | POA: Diagnosis not present

## 2023-11-19 DIAGNOSIS — R29898 Other symptoms and signs involving the musculoskeletal system: Secondary | ICD-10-CM | POA: Diagnosis not present

## 2023-11-19 DIAGNOSIS — Z9189 Other specified personal risk factors, not elsewhere classified: Secondary | ICD-10-CM | POA: Diagnosis not present

## 2023-11-19 DIAGNOSIS — R1312 Dysphagia, oropharyngeal phase: Secondary | ICD-10-CM | POA: Diagnosis not present

## 2023-11-19 LAB — CUP PACEART REMOTE DEVICE CHECK
Date Time Interrogation Session: 20250123005500
Implantable Pulse Generator Implant Date: 20230309
Pulse Gen Serial Number: 172093

## 2023-11-22 ENCOUNTER — Encounter: Payer: Self-pay | Admitting: Cardiology

## 2023-11-24 DIAGNOSIS — Z9189 Other specified personal risk factors, not elsewhere classified: Secondary | ICD-10-CM | POA: Diagnosis not present

## 2023-11-24 DIAGNOSIS — R29898 Other symptoms and signs involving the musculoskeletal system: Secondary | ICD-10-CM | POA: Diagnosis not present

## 2023-11-24 DIAGNOSIS — M6281 Muscle weakness (generalized): Secondary | ICD-10-CM | POA: Diagnosis not present

## 2023-11-24 DIAGNOSIS — R2689 Other abnormalities of gait and mobility: Secondary | ICD-10-CM | POA: Diagnosis not present

## 2023-11-24 DIAGNOSIS — R1312 Dysphagia, oropharyngeal phase: Secondary | ICD-10-CM | POA: Diagnosis not present

## 2023-11-25 DIAGNOSIS — Z9189 Other specified personal risk factors, not elsewhere classified: Secondary | ICD-10-CM | POA: Diagnosis not present

## 2023-11-25 DIAGNOSIS — R29898 Other symptoms and signs involving the musculoskeletal system: Secondary | ICD-10-CM | POA: Diagnosis not present

## 2023-11-25 DIAGNOSIS — R1312 Dysphagia, oropharyngeal phase: Secondary | ICD-10-CM | POA: Diagnosis not present

## 2023-11-25 DIAGNOSIS — R2689 Other abnormalities of gait and mobility: Secondary | ICD-10-CM | POA: Diagnosis not present

## 2023-11-25 DIAGNOSIS — M6281 Muscle weakness (generalized): Secondary | ICD-10-CM | POA: Diagnosis not present

## 2023-11-26 NOTE — Progress Notes (Signed)
BSX LOOP RECORDER

## 2023-11-27 DIAGNOSIS — M6281 Muscle weakness (generalized): Secondary | ICD-10-CM | POA: Diagnosis not present

## 2023-11-27 DIAGNOSIS — R2689 Other abnormalities of gait and mobility: Secondary | ICD-10-CM | POA: Diagnosis not present

## 2023-11-27 DIAGNOSIS — R1312 Dysphagia, oropharyngeal phase: Secondary | ICD-10-CM | POA: Diagnosis not present

## 2023-11-27 DIAGNOSIS — Z9189 Other specified personal risk factors, not elsewhere classified: Secondary | ICD-10-CM | POA: Diagnosis not present

## 2023-11-27 DIAGNOSIS — R29898 Other symptoms and signs involving the musculoskeletal system: Secondary | ICD-10-CM | POA: Diagnosis not present

## 2023-12-01 DIAGNOSIS — Z9189 Other specified personal risk factors, not elsewhere classified: Secondary | ICD-10-CM | POA: Diagnosis not present

## 2023-12-01 DIAGNOSIS — R1312 Dysphagia, oropharyngeal phase: Secondary | ICD-10-CM | POA: Diagnosis not present

## 2023-12-01 DIAGNOSIS — R2689 Other abnormalities of gait and mobility: Secondary | ICD-10-CM | POA: Diagnosis not present

## 2023-12-01 DIAGNOSIS — R29898 Other symptoms and signs involving the musculoskeletal system: Secondary | ICD-10-CM | POA: Diagnosis not present

## 2023-12-01 DIAGNOSIS — M6281 Muscle weakness (generalized): Secondary | ICD-10-CM | POA: Diagnosis not present

## 2023-12-02 DIAGNOSIS — R1312 Dysphagia, oropharyngeal phase: Secondary | ICD-10-CM | POA: Diagnosis not present

## 2023-12-02 DIAGNOSIS — R29898 Other symptoms and signs involving the musculoskeletal system: Secondary | ICD-10-CM | POA: Diagnosis not present

## 2023-12-02 DIAGNOSIS — R2689 Other abnormalities of gait and mobility: Secondary | ICD-10-CM | POA: Diagnosis not present

## 2023-12-02 DIAGNOSIS — Z9189 Other specified personal risk factors, not elsewhere classified: Secondary | ICD-10-CM | POA: Diagnosis not present

## 2023-12-02 DIAGNOSIS — M6281 Muscle weakness (generalized): Secondary | ICD-10-CM | POA: Diagnosis not present

## 2023-12-03 ENCOUNTER — Other Ambulatory Visit: Payer: Medicare Other

## 2023-12-04 DIAGNOSIS — R2689 Other abnormalities of gait and mobility: Secondary | ICD-10-CM | POA: Diagnosis not present

## 2023-12-04 DIAGNOSIS — Z9189 Other specified personal risk factors, not elsewhere classified: Secondary | ICD-10-CM | POA: Diagnosis not present

## 2023-12-04 DIAGNOSIS — R29898 Other symptoms and signs involving the musculoskeletal system: Secondary | ICD-10-CM | POA: Diagnosis not present

## 2023-12-04 DIAGNOSIS — M6281 Muscle weakness (generalized): Secondary | ICD-10-CM | POA: Diagnosis not present

## 2023-12-04 DIAGNOSIS — R1312 Dysphagia, oropharyngeal phase: Secondary | ICD-10-CM | POA: Diagnosis not present

## 2023-12-08 DIAGNOSIS — Z9189 Other specified personal risk factors, not elsewhere classified: Secondary | ICD-10-CM | POA: Diagnosis not present

## 2023-12-08 DIAGNOSIS — R2689 Other abnormalities of gait and mobility: Secondary | ICD-10-CM | POA: Diagnosis not present

## 2023-12-08 DIAGNOSIS — M6281 Muscle weakness (generalized): Secondary | ICD-10-CM | POA: Diagnosis not present

## 2023-12-08 DIAGNOSIS — R29898 Other symptoms and signs involving the musculoskeletal system: Secondary | ICD-10-CM | POA: Diagnosis not present

## 2023-12-08 DIAGNOSIS — R1312 Dysphagia, oropharyngeal phase: Secondary | ICD-10-CM | POA: Diagnosis not present

## 2023-12-09 DIAGNOSIS — R1312 Dysphagia, oropharyngeal phase: Secondary | ICD-10-CM | POA: Diagnosis not present

## 2023-12-09 DIAGNOSIS — R29898 Other symptoms and signs involving the musculoskeletal system: Secondary | ICD-10-CM | POA: Diagnosis not present

## 2023-12-09 DIAGNOSIS — M6281 Muscle weakness (generalized): Secondary | ICD-10-CM | POA: Diagnosis not present

## 2023-12-09 DIAGNOSIS — R2689 Other abnormalities of gait and mobility: Secondary | ICD-10-CM | POA: Diagnosis not present

## 2023-12-09 DIAGNOSIS — Z9189 Other specified personal risk factors, not elsewhere classified: Secondary | ICD-10-CM | POA: Diagnosis not present

## 2023-12-11 ENCOUNTER — Encounter: Payer: Self-pay | Admitting: Sports Medicine

## 2023-12-11 ENCOUNTER — Encounter: Payer: Medicare Other | Admitting: Sports Medicine

## 2023-12-11 DIAGNOSIS — M6281 Muscle weakness (generalized): Secondary | ICD-10-CM | POA: Diagnosis not present

## 2023-12-11 DIAGNOSIS — Z9189 Other specified personal risk factors, not elsewhere classified: Secondary | ICD-10-CM | POA: Diagnosis not present

## 2023-12-11 DIAGNOSIS — R2689 Other abnormalities of gait and mobility: Secondary | ICD-10-CM | POA: Diagnosis not present

## 2023-12-11 DIAGNOSIS — R29898 Other symptoms and signs involving the musculoskeletal system: Secondary | ICD-10-CM | POA: Diagnosis not present

## 2023-12-11 DIAGNOSIS — R1312 Dysphagia, oropharyngeal phase: Secondary | ICD-10-CM | POA: Diagnosis not present

## 2023-12-14 ENCOUNTER — Other Ambulatory Visit: Payer: Self-pay | Admitting: *Deleted

## 2023-12-14 NOTE — Telephone Encounter (Signed)
 [facility] called requesting Refill.  Pended Rx and sent to Dr. Jacquenette Shone for approval.

## 2023-12-14 NOTE — Progress Notes (Signed)
 This encounter was created in error - please disregard.

## 2023-12-14 NOTE — Telephone Encounter (Signed)
 Tried calling patient twice, rings then hangs up. Will try again later.

## 2023-12-14 NOTE — Telephone Encounter (Signed)
 Tina Sheffield, MD to Me  [facility]     12/14/23  3:30 PM  Can you check why she needs the medicine for ?

## 2023-12-15 ENCOUNTER — Telehealth: Payer: Self-pay

## 2023-12-15 DIAGNOSIS — M2042 Other hammer toe(s) (acquired), left foot: Secondary | ICD-10-CM | POA: Diagnosis not present

## 2023-12-15 DIAGNOSIS — R29898 Other symptoms and signs involving the musculoskeletal system: Secondary | ICD-10-CM | POA: Diagnosis not present

## 2023-12-15 DIAGNOSIS — R2689 Other abnormalities of gait and mobility: Secondary | ICD-10-CM | POA: Diagnosis not present

## 2023-12-15 DIAGNOSIS — L602 Onychogryphosis: Secondary | ICD-10-CM | POA: Diagnosis not present

## 2023-12-15 DIAGNOSIS — M6281 Muscle weakness (generalized): Secondary | ICD-10-CM | POA: Diagnosis not present

## 2023-12-15 DIAGNOSIS — M2041 Other hammer toe(s) (acquired), right foot: Secondary | ICD-10-CM | POA: Diagnosis not present

## 2023-12-15 DIAGNOSIS — R1312 Dysphagia, oropharyngeal phase: Secondary | ICD-10-CM | POA: Diagnosis not present

## 2023-12-15 DIAGNOSIS — Z9189 Other specified personal risk factors, not elsewhere classified: Secondary | ICD-10-CM | POA: Diagnosis not present

## 2023-12-15 NOTE — Telephone Encounter (Signed)
 Tina Mercado, [place] nurse with [facility] called on behalf of patient requesting her CT results. Patient told Rosey Bath that no one has discussed her results with her. I informed Rosey Bath that we have made several unsuccessful attempts to reach patient and she was suppose to have an appointment last Friday to discuss as well.  Rosey Bath asked that I fax results to [phone], in which I did and she would give patient a copy and discuss with her.

## 2023-12-16 ENCOUNTER — Telehealth: Payer: Self-pay

## 2023-12-16 DIAGNOSIS — R29898 Other symptoms and signs involving the musculoskeletal system: Secondary | ICD-10-CM | POA: Diagnosis not present

## 2023-12-16 DIAGNOSIS — Z9189 Other specified personal risk factors, not elsewhere classified: Secondary | ICD-10-CM | POA: Diagnosis not present

## 2023-12-16 DIAGNOSIS — R1312 Dysphagia, oropharyngeal phase: Secondary | ICD-10-CM | POA: Diagnosis not present

## 2023-12-16 DIAGNOSIS — R2689 Other abnormalities of gait and mobility: Secondary | ICD-10-CM | POA: Diagnosis not present

## 2023-12-16 DIAGNOSIS — M6281 Muscle weakness (generalized): Secondary | ICD-10-CM | POA: Diagnosis not present

## 2023-12-16 NOTE — Telephone Encounter (Signed)
 Tried calling patient, Just Rings. To try again later.

## 2023-12-16 NOTE — Telephone Encounter (Signed)
 Note received from pharmacy stating patient would like a refill on Clotrimazole (never filled there before) and they need a new rx if request is appropriate  Will send to Venita Sheffield, MD to review and advise request for antifungal medication

## 2023-12-16 NOTE — Telephone Encounter (Signed)
 Telephone encounter started. See Telephone Encounter dated 12/16/2023.

## 2023-12-16 NOTE — Telephone Encounter (Signed)
 Tina Sheffield, MD  [facility]52 minutes ago (11:57 AM)    Is it for vaginal itching or oral thrush, can you check with the patient, thanks.     Tried calling patient, Just rings. To try again later.

## 2023-12-17 NOTE — Telephone Encounter (Signed)
 Attempted to call patient about medication refill. No voicemail phone just rings

## 2023-12-18 DIAGNOSIS — R2689 Other abnormalities of gait and mobility: Secondary | ICD-10-CM | POA: Diagnosis not present

## 2023-12-18 DIAGNOSIS — Z9189 Other specified personal risk factors, not elsewhere classified: Secondary | ICD-10-CM | POA: Diagnosis not present

## 2023-12-18 DIAGNOSIS — R1312 Dysphagia, oropharyngeal phase: Secondary | ICD-10-CM | POA: Diagnosis not present

## 2023-12-18 DIAGNOSIS — M6281 Muscle weakness (generalized): Secondary | ICD-10-CM | POA: Diagnosis not present

## 2023-12-18 DIAGNOSIS — R29898 Other symptoms and signs involving the musculoskeletal system: Secondary | ICD-10-CM | POA: Diagnosis not present

## 2023-12-21 ENCOUNTER — Ambulatory Visit (INDEPENDENT_AMBULATORY_CARE_PROVIDER_SITE_OTHER): Payer: Medicare Other

## 2023-12-21 DIAGNOSIS — I639 Cerebral infarction, unspecified: Secondary | ICD-10-CM | POA: Diagnosis not present

## 2023-12-21 LAB — CUP PACEART REMOTE DEVICE CHECK
Date Time Interrogation Session: 20250224010000
Implantable Pulse Generator Implant Date: 20230309
Pulse Gen Serial Number: 172093

## 2023-12-21 NOTE — Telephone Encounter (Signed)
 Rosey Bath with [facility] called and stated patient has been trying to get a refill on her Clotrimazole 10mg .   I told Rosey Bath that we have been trying to contact the patient to find out if she uses the medication for Vaginal Itching or Oral Thrush.  Rosey Bath stated that patient uses an inhaler and it causes Oral Thrush and that is why she uses it.   Pended Rx and sent to Dr. Jacquenette Shone for approval.

## 2023-12-22 ENCOUNTER — Encounter: Payer: Self-pay | Admitting: Cardiology

## 2023-12-22 DIAGNOSIS — R1312 Dysphagia, oropharyngeal phase: Secondary | ICD-10-CM | POA: Diagnosis not present

## 2023-12-22 DIAGNOSIS — R2689 Other abnormalities of gait and mobility: Secondary | ICD-10-CM | POA: Diagnosis not present

## 2023-12-22 DIAGNOSIS — M6281 Muscle weakness (generalized): Secondary | ICD-10-CM | POA: Diagnosis not present

## 2023-12-22 DIAGNOSIS — Z9189 Other specified personal risk factors, not elsewhere classified: Secondary | ICD-10-CM | POA: Diagnosis not present

## 2023-12-22 DIAGNOSIS — R29898 Other symptoms and signs involving the musculoskeletal system: Secondary | ICD-10-CM | POA: Diagnosis not present

## 2023-12-22 MED ORDER — NYSTATIN 100000 UNIT/ML MT SUSP: 5.0000 mL | Freq: Four times a day (QID) | OROMUCOSAL | 3 refills | Status: DC

## 2023-12-22 NOTE — Telephone Encounter (Signed)
 Patient aware of patients response, Verbalized understanding.

## 2023-12-22 NOTE — Telephone Encounter (Addendum)
 Venita Sheffield, MD  [facility]21 minutes ago (9:12 AM)    Will prescribe nystatin oral suspension for oral thrush As clotrimazole can cause liver damage    Outgoing call placed to patient, No answer, No voicemail

## 2023-12-23 DIAGNOSIS — R2689 Other abnormalities of gait and mobility: Secondary | ICD-10-CM | POA: Diagnosis not present

## 2023-12-23 DIAGNOSIS — M6281 Muscle weakness (generalized): Secondary | ICD-10-CM | POA: Diagnosis not present

## 2023-12-23 DIAGNOSIS — R29898 Other symptoms and signs involving the musculoskeletal system: Secondary | ICD-10-CM | POA: Diagnosis not present

## 2023-12-23 DIAGNOSIS — R1312 Dysphagia, oropharyngeal phase: Secondary | ICD-10-CM | POA: Diagnosis not present

## 2023-12-23 DIAGNOSIS — Z9189 Other specified personal risk factors, not elsewhere classified: Secondary | ICD-10-CM | POA: Diagnosis not present

## 2023-12-25 DIAGNOSIS — R1312 Dysphagia, oropharyngeal phase: Secondary | ICD-10-CM | POA: Diagnosis not present

## 2023-12-25 DIAGNOSIS — M6281 Muscle weakness (generalized): Secondary | ICD-10-CM | POA: Diagnosis not present

## 2023-12-25 DIAGNOSIS — Z9189 Other specified personal risk factors, not elsewhere classified: Secondary | ICD-10-CM | POA: Diagnosis not present

## 2023-12-25 DIAGNOSIS — R2689 Other abnormalities of gait and mobility: Secondary | ICD-10-CM | POA: Diagnosis not present

## 2023-12-25 DIAGNOSIS — R29898 Other symptoms and signs involving the musculoskeletal system: Secondary | ICD-10-CM | POA: Diagnosis not present

## 2023-12-29 DIAGNOSIS — R29898 Other symptoms and signs involving the musculoskeletal system: Secondary | ICD-10-CM | POA: Diagnosis not present

## 2023-12-29 DIAGNOSIS — M6281 Muscle weakness (generalized): Secondary | ICD-10-CM | POA: Diagnosis not present

## 2023-12-29 DIAGNOSIS — Z9189 Other specified personal risk factors, not elsewhere classified: Secondary | ICD-10-CM | POA: Diagnosis not present

## 2023-12-29 DIAGNOSIS — R2689 Other abnormalities of gait and mobility: Secondary | ICD-10-CM | POA: Diagnosis not present

## 2023-12-30 DIAGNOSIS — R2689 Other abnormalities of gait and mobility: Secondary | ICD-10-CM | POA: Diagnosis not present

## 2023-12-30 DIAGNOSIS — Z9189 Other specified personal risk factors, not elsewhere classified: Secondary | ICD-10-CM | POA: Diagnosis not present

## 2023-12-30 DIAGNOSIS — M6281 Muscle weakness (generalized): Secondary | ICD-10-CM | POA: Diagnosis not present

## 2023-12-30 DIAGNOSIS — R29898 Other symptoms and signs involving the musculoskeletal system: Secondary | ICD-10-CM | POA: Diagnosis not present

## 2023-12-31 NOTE — Progress Notes (Signed)
 [facility] Summary Report / Loop Recorder

## 2024-01-01 DIAGNOSIS — R2689 Other abnormalities of gait and mobility: Secondary | ICD-10-CM | POA: Diagnosis not present

## 2024-01-01 DIAGNOSIS — Z9189 Other specified personal risk factors, not elsewhere classified: Secondary | ICD-10-CM | POA: Diagnosis not present

## 2024-01-01 DIAGNOSIS — M6281 Muscle weakness (generalized): Secondary | ICD-10-CM | POA: Diagnosis not present

## 2024-01-01 DIAGNOSIS — R29898 Other symptoms and signs involving the musculoskeletal system: Secondary | ICD-10-CM | POA: Diagnosis not present

## 2024-01-05 DIAGNOSIS — M6281 Muscle weakness (generalized): Secondary | ICD-10-CM | POA: Diagnosis not present

## 2024-01-05 DIAGNOSIS — R29898 Other symptoms and signs involving the musculoskeletal system: Secondary | ICD-10-CM | POA: Diagnosis not present

## 2024-01-05 DIAGNOSIS — R2689 Other abnormalities of gait and mobility: Secondary | ICD-10-CM | POA: Diagnosis not present

## 2024-01-05 DIAGNOSIS — Z9189 Other specified personal risk factors, not elsewhere classified: Secondary | ICD-10-CM | POA: Diagnosis not present

## 2024-01-06 DIAGNOSIS — Z9189 Other specified personal risk factors, not elsewhere classified: Secondary | ICD-10-CM | POA: Diagnosis not present

## 2024-01-06 DIAGNOSIS — M6281 Muscle weakness (generalized): Secondary | ICD-10-CM | POA: Diagnosis not present

## 2024-01-06 DIAGNOSIS — R29898 Other symptoms and signs involving the musculoskeletal system: Secondary | ICD-10-CM | POA: Diagnosis not present

## 2024-01-06 DIAGNOSIS — R2689 Other abnormalities of gait and mobility: Secondary | ICD-10-CM | POA: Diagnosis not present

## 2024-01-08 DIAGNOSIS — R29898 Other symptoms and signs involving the musculoskeletal system: Secondary | ICD-10-CM | POA: Diagnosis not present

## 2024-01-08 DIAGNOSIS — M6281 Muscle weakness (generalized): Secondary | ICD-10-CM | POA: Diagnosis not present

## 2024-01-08 DIAGNOSIS — Z9189 Other specified personal risk factors, not elsewhere classified: Secondary | ICD-10-CM | POA: Diagnosis not present

## 2024-01-08 DIAGNOSIS — R2689 Other abnormalities of gait and mobility: Secondary | ICD-10-CM | POA: Diagnosis not present

## 2024-01-12 DIAGNOSIS — M6281 Muscle weakness (generalized): Secondary | ICD-10-CM | POA: Diagnosis not present

## 2024-01-12 DIAGNOSIS — R2689 Other abnormalities of gait and mobility: Secondary | ICD-10-CM | POA: Diagnosis not present

## 2024-01-12 DIAGNOSIS — Z9189 Other specified personal risk factors, not elsewhere classified: Secondary | ICD-10-CM | POA: Diagnosis not present

## 2024-01-12 DIAGNOSIS — R29898 Other symptoms and signs involving the musculoskeletal system: Secondary | ICD-10-CM | POA: Diagnosis not present

## 2024-01-13 DIAGNOSIS — R29898 Other symptoms and signs involving the musculoskeletal system: Secondary | ICD-10-CM | POA: Diagnosis not present

## 2024-01-13 DIAGNOSIS — M6281 Muscle weakness (generalized): Secondary | ICD-10-CM | POA: Diagnosis not present

## 2024-01-13 DIAGNOSIS — R2689 Other abnormalities of gait and mobility: Secondary | ICD-10-CM | POA: Diagnosis not present

## 2024-01-13 DIAGNOSIS — Z9189 Other specified personal risk factors, not elsewhere classified: Secondary | ICD-10-CM | POA: Diagnosis not present

## 2024-01-15 DIAGNOSIS — M6281 Muscle weakness (generalized): Secondary | ICD-10-CM | POA: Diagnosis not present

## 2024-01-15 DIAGNOSIS — R2689 Other abnormalities of gait and mobility: Secondary | ICD-10-CM | POA: Diagnosis not present

## 2024-01-15 DIAGNOSIS — R29898 Other symptoms and signs involving the musculoskeletal system: Secondary | ICD-10-CM | POA: Diagnosis not present

## 2024-01-15 DIAGNOSIS — Z9189 Other specified personal risk factors, not elsewhere classified: Secondary | ICD-10-CM | POA: Diagnosis not present

## 2024-01-19 DIAGNOSIS — R2689 Other abnormalities of gait and mobility: Secondary | ICD-10-CM | POA: Diagnosis not present

## 2024-01-19 DIAGNOSIS — Z9189 Other specified personal risk factors, not elsewhere classified: Secondary | ICD-10-CM | POA: Diagnosis not present

## 2024-01-19 DIAGNOSIS — M6281 Muscle weakness (generalized): Secondary | ICD-10-CM | POA: Diagnosis not present

## 2024-01-19 DIAGNOSIS — R29898 Other symptoms and signs involving the musculoskeletal system: Secondary | ICD-10-CM | POA: Diagnosis not present

## 2024-01-20 DIAGNOSIS — M6281 Muscle weakness (generalized): Secondary | ICD-10-CM | POA: Diagnosis not present

## 2024-01-20 DIAGNOSIS — R2689 Other abnormalities of gait and mobility: Secondary | ICD-10-CM | POA: Diagnosis not present

## 2024-01-20 DIAGNOSIS — R29898 Other symptoms and signs involving the musculoskeletal system: Secondary | ICD-10-CM | POA: Diagnosis not present

## 2024-01-20 DIAGNOSIS — Z9189 Other specified personal risk factors, not elsewhere classified: Secondary | ICD-10-CM | POA: Diagnosis not present

## 2024-01-21 ENCOUNTER — Ambulatory Visit (INDEPENDENT_AMBULATORY_CARE_PROVIDER_SITE_OTHER): Payer: Medicare Other

## 2024-01-21 DIAGNOSIS — I639 Cerebral infarction, unspecified: Secondary | ICD-10-CM

## 2024-01-21 LAB — CUP PACEART REMOTE DEVICE CHECK
Date Time Interrogation Session: 20250327010600
Implantable Pulse Generator Implant Date: 20230309
Pulse Gen Serial Number: 172093

## 2024-01-22 DIAGNOSIS — Z9189 Other specified personal risk factors, not elsewhere classified: Secondary | ICD-10-CM | POA: Diagnosis not present

## 2024-01-22 DIAGNOSIS — R2689 Other abnormalities of gait and mobility: Secondary | ICD-10-CM | POA: Diagnosis not present

## 2024-01-22 DIAGNOSIS — M6281 Muscle weakness (generalized): Secondary | ICD-10-CM | POA: Diagnosis not present

## 2024-01-22 DIAGNOSIS — R29898 Other symptoms and signs involving the musculoskeletal system: Secondary | ICD-10-CM | POA: Diagnosis not present

## 2024-01-23 ENCOUNTER — Encounter: Payer: Self-pay | Admitting: Cardiology

## 2024-01-26 ENCOUNTER — Other Ambulatory Visit: Payer: Self-pay | Admitting: Sports Medicine

## 2024-01-26 DIAGNOSIS — R2681 Unsteadiness on feet: Secondary | ICD-10-CM | POA: Diagnosis not present

## 2024-01-26 DIAGNOSIS — M6281 Muscle weakness (generalized): Secondary | ICD-10-CM | POA: Diagnosis not present

## 2024-01-26 DIAGNOSIS — I69320 Aphasia following cerebral infarction: Secondary | ICD-10-CM | POA: Diagnosis not present

## 2024-01-26 DIAGNOSIS — M25511 Pain in right shoulder: Secondary | ICD-10-CM | POA: Diagnosis not present

## 2024-01-26 DIAGNOSIS — R29898 Other symptoms and signs involving the musculoskeletal system: Secondary | ICD-10-CM | POA: Diagnosis not present

## 2024-01-26 NOTE — Telephone Encounter (Signed)
 Last Fill: Unknown  Last OV: 12/11/23 Next OV: None Scheduled  Routing to provider for review/authorization.

## 2024-01-26 NOTE — Telephone Encounter (Signed)
 Copied from CRM [id number]. Topic: Clinical - Medication Refill >> Jan 26, 2024  3:12 PM Adrianna P wrote: Most Recent Primary Care Visit:  Provider: Venita Sheffield  Department: [facility]  Visit Type: [facility]  Date: 12/11/2023  Medication: mometasone-formoterol (DULERA) 100-5 MCG/ACT AERO  Has the patient contacted their pharmacy? Yes (Agent: If no, request that the patient contact the pharmacy for the refill. If patient does not wish to contact the pharmacy document the reason why and proceed with request.) (Agent: If yes, when and what did the pharmacy advise?)  Is this the correct pharmacy for this prescription? Yes If no, delete pharmacy and type the correct one.  This is the patient's preferred pharmacy: No Pharmacies Listed  Has the prescription been filled recently? No  Is the patient out of the medication? Yes  Has the patient been seen for an appointment in the last year OR does the patient have an upcoming appointment? Yes  Can we respond through MyChart? No  Agent: Please be advised that Rx refills may take up to 3 business days. We ask that you follow-up with your pharmacy.

## 2024-01-26 NOTE — Telephone Encounter (Signed)
 Patient requested refill.  Refill Pended and sent to Dr. Jacquenette Shone for approval due to HIGH ALERT Warning.

## 2024-01-26 NOTE — Progress Notes (Signed)
 Bsx Loop Recorder

## 2024-01-26 NOTE — Telephone Encounter (Signed)
 Copied from CRM [id number]. Topic: Clinical - Medication Refill >> Jan 26, 2024  3:16 PM Adrianna P wrote: Most Recent Primary Care Visit:  Provider: Venita Sheffield  Department: [facility]  Visit Type: [facility]  Date: 12/11/2023  Medication: mometasone-formoterol (DULERA) 100-5 MCG/ACT AERO  Has the patient contacted their pharmacy? Yes (Agent: If no, request that the patient contact the pharmacy for the refill. If patient does not wish to contact the pharmacy document the reason why and proceed with request.) (Agent: If yes, when and what did the pharmacy advise?)  Is this the correct pharmacy for this prescription? Yes If no, delete pharmacy and type the correct one.  This is the patient's preferred pharmacy: [facility]  Has the prescription been filled recently? No  Is the patient out of the medication? Yes  Has the patient been seen for an appointment in the last year OR does the patient have an upcoming appointment? Yes  Can we respond through MyChart? No  Agent: Please be advised that Rx refills may take up to 3 business days. We ask that you follow-up with your pharmacy.

## 2024-01-27 DIAGNOSIS — M25511 Pain in right shoulder: Secondary | ICD-10-CM | POA: Diagnosis not present

## 2024-01-27 DIAGNOSIS — I69320 Aphasia following cerebral infarction: Secondary | ICD-10-CM | POA: Diagnosis not present

## 2024-01-27 DIAGNOSIS — R29898 Other symptoms and signs involving the musculoskeletal system: Secondary | ICD-10-CM | POA: Diagnosis not present

## 2024-01-27 DIAGNOSIS — M6281 Muscle weakness (generalized): Secondary | ICD-10-CM | POA: Diagnosis not present

## 2024-01-27 DIAGNOSIS — R2681 Unsteadiness on feet: Secondary | ICD-10-CM | POA: Diagnosis not present

## 2024-01-27 NOTE — Telephone Encounter (Signed)
 Spoke with patient she stated that she didn't know who changed it and she didn't know when they changed it. She stated she is fine with it as long as she gets an inhaler.

## 2024-01-27 NOTE — Telephone Encounter (Signed)
 High risk warning populated when attempting to refill medication  Please review and approval if appropriate   Thanks,  [facility] W/CMA

## 2024-01-28 ENCOUNTER — Telehealth: Payer: Self-pay

## 2024-01-28 MED ORDER — DULERA 100-5 MCG/ACT IN AERO: 2.0000 | INHALATION_SPRAY | Freq: Two times a day (BID) | RESPIRATORY_TRACT | 5 refills | Status: DC

## 2024-01-28 NOTE — Telephone Encounter (Signed)
 Copied from CRM [id number]. Topic: Clinical - Prescription Issue >> Jan 28, 2024  9:44 AM Thomes Dinning wrote: Reason for CRM: Selena Batten from [facility] [phone]) called to inform Dr. Jacquenette Shone the following medication: mometasone-formoterol (DULERA) 100-5 MCG/ACT AERO will require a prior auth before they are able to fill it.  Spoke with Selena Batten this afternoon and to let her know we got the prior authorization for the medication that needs to sign from Venita Sheffield, MD. Prior authorization is in [place], MD box to signed

## 2024-01-29 DIAGNOSIS — R29898 Other symptoms and signs involving the musculoskeletal system: Secondary | ICD-10-CM | POA: Diagnosis not present

## 2024-01-29 DIAGNOSIS — M25511 Pain in right shoulder: Secondary | ICD-10-CM | POA: Diagnosis not present

## 2024-01-29 DIAGNOSIS — M6281 Muscle weakness (generalized): Secondary | ICD-10-CM | POA: Diagnosis not present

## 2024-01-29 DIAGNOSIS — I69320 Aphasia following cerebral infarction: Secondary | ICD-10-CM | POA: Diagnosis not present

## 2024-01-29 DIAGNOSIS — R2681 Unsteadiness on feet: Secondary | ICD-10-CM | POA: Diagnosis not present

## 2024-01-29 MED ORDER — BUDESONIDE-FORMOTEROL FUMARATE 80-4.5 MCG/ACT IN AERO: 2.0000 | INHALATION_SPRAY | Freq: Two times a day (BID) | RESPIRATORY_TRACT | 3 refills | Status: DC

## 2024-01-29 NOTE — Addendum Note (Signed)
 Addended by: Venita Sheffield on: 01/29/2024 01:10 PM   Modules accepted: Orders

## 2024-01-29 NOTE — Telephone Encounter (Signed)
 Venita Sheffield, MD  [facility]2 hours ago (1:10 PM)    Will change to symbicort as it shows as preferred per insurance and sent to mail order    Patient is aware of medication change

## 2024-01-30 ENCOUNTER — Other Ambulatory Visit: Payer: Self-pay | Admitting: Sports Medicine

## 2024-02-01 DIAGNOSIS — M6281 Muscle weakness (generalized): Secondary | ICD-10-CM | POA: Diagnosis not present

## 2024-02-01 DIAGNOSIS — M25511 Pain in right shoulder: Secondary | ICD-10-CM | POA: Diagnosis not present

## 2024-02-01 DIAGNOSIS — R29898 Other symptoms and signs involving the musculoskeletal system: Secondary | ICD-10-CM | POA: Diagnosis not present

## 2024-02-01 DIAGNOSIS — I69320 Aphasia following cerebral infarction: Secondary | ICD-10-CM | POA: Diagnosis not present

## 2024-02-01 DIAGNOSIS — R2681 Unsteadiness on feet: Secondary | ICD-10-CM | POA: Diagnosis not present

## 2024-02-02 ENCOUNTER — Telehealth: Payer: Self-pay | Admitting: Sports Medicine

## 2024-02-02 NOTE — Telephone Encounter (Signed)
 [facility] called and spoke to [place], [place] regarding the below. She says she will transfer me to someone clinical to assist. She connected me to [employer], Pensions consultant. He says the Rx budesonide-formoterol inhaler was received and there is a red flag alert that the patient is allergic to corticosteroid and is it ok to refill. Advised I will send this to the provider and someone will call back with her recommendation. He says to call back at the number below with the reference number to get to them directly.  Copied from CRM [id number]. Topic: Clinical - Lab/Test Results >> Feb 02, 2024 11:31 AM Thomes Dinning wrote: Reason for CRM:  [facility] would like a call back at [phone] option 2 to confirm the patient allergies. When calling back please use the below reference # Reference # [id number]

## 2024-02-03 ENCOUNTER — Telehealth: Payer: Self-pay

## 2024-02-03 DIAGNOSIS — M25511 Pain in right shoulder: Secondary | ICD-10-CM | POA: Diagnosis not present

## 2024-02-03 DIAGNOSIS — R29898 Other symptoms and signs involving the musculoskeletal system: Secondary | ICD-10-CM | POA: Diagnosis not present

## 2024-02-03 DIAGNOSIS — R2681 Unsteadiness on feet: Secondary | ICD-10-CM | POA: Diagnosis not present

## 2024-02-03 DIAGNOSIS — M6281 Muscle weakness (generalized): Secondary | ICD-10-CM | POA: Diagnosis not present

## 2024-02-03 DIAGNOSIS — I69320 Aphasia following cerebral infarction: Secondary | ICD-10-CM | POA: Diagnosis not present

## 2024-02-03 MED ORDER — BUDESONIDE-FORMOTEROL FUMARATE 80-4.5 MCG/ACT IN AERO: 2.0000 | INHALATION_SPRAY | Freq: Two times a day (BID) | RESPIRATORY_TRACT | 3 refills | Status: AC

## 2024-02-03 NOTE — Telephone Encounter (Signed)
 Copied from CRM [id number]. Topic: Clinical - Prescription Issue >> Jan 28, 2024  9:44 AM Thomes Dinning wrote: Reason for CRM: Selena Batten from [facility] [phone]) called to inform Dr. Jacquenette Shone the following medication: mometasone-formoterol (DULERA) 100-5 MCG/ACT AERO will require a prior auth before they are able to fill it. >> Feb 02, 2024  4:20 PM Maxwell Marion wrote: Nehemiah Settle from [facility] called about medication budesonide-formoterol (SYMBICORT) 80-4.5 MCG/ACT inhaler . Says patient told them the medication would be sent to them and they have been keeping an eye out but haven't received anything. Per notes, it looks like the medication was sent to mail order pharmacy.   Spoke with patient, RX now sent to [facility] as patient is no longer using [facility]. IF a PA is needed it will come via fax

## 2024-02-03 NOTE — Telephone Encounter (Signed)
 Tina Sheffield, MD  [facility]20 hours ago (1:19 PM)    Ok to refill as pt was taking [place] before Switched to symbicort due to [employer] returned to [employer] and providers response given.

## 2024-02-05 DIAGNOSIS — I69320 Aphasia following cerebral infarction: Secondary | ICD-10-CM | POA: Diagnosis not present

## 2024-02-05 DIAGNOSIS — R2681 Unsteadiness on feet: Secondary | ICD-10-CM | POA: Diagnosis not present

## 2024-02-05 DIAGNOSIS — M6281 Muscle weakness (generalized): Secondary | ICD-10-CM | POA: Diagnosis not present

## 2024-02-05 DIAGNOSIS — R29898 Other symptoms and signs involving the musculoskeletal system: Secondary | ICD-10-CM | POA: Diagnosis not present

## 2024-02-05 DIAGNOSIS — M25511 Pain in right shoulder: Secondary | ICD-10-CM | POA: Diagnosis not present

## 2024-02-15 DIAGNOSIS — M25511 Pain in right shoulder: Secondary | ICD-10-CM | POA: Insufficient documentation

## 2024-02-15 DIAGNOSIS — M7541 Impingement syndrome of right shoulder: Secondary | ICD-10-CM | POA: Diagnosis not present

## 2024-02-19 ENCOUNTER — Emergency Department (HOSPITAL_COMMUNITY)

## 2024-02-19 ENCOUNTER — Other Ambulatory Visit: Payer: Self-pay

## 2024-02-19 ENCOUNTER — Encounter (HOSPITAL_COMMUNITY): Payer: Self-pay | Admitting: Radiology

## 2024-02-19 ENCOUNTER — Observation Stay (HOSPITAL_COMMUNITY)
Admission: EM | Admit: 2024-02-19 | Discharge: 2024-02-22 | Disposition: A | Discharge status: Home / Self Care | Attending: Internal Medicine | Admitting: Internal Medicine

## 2024-02-19 DIAGNOSIS — K8020 Calculus of gallbladder without cholecystitis without obstruction: Secondary | ICD-10-CM | POA: Diagnosis not present

## 2024-02-19 DIAGNOSIS — E876 Hypokalemia: Secondary | ICD-10-CM | POA: Insufficient documentation

## 2024-02-19 DIAGNOSIS — I10 Essential (primary) hypertension: Secondary | ICD-10-CM | POA: Diagnosis not present

## 2024-02-19 DIAGNOSIS — Z79899 Other long term (current) drug therapy: Secondary | ICD-10-CM | POA: Insufficient documentation

## 2024-02-19 DIAGNOSIS — Z8673 Personal history of transient ischemic attack (TIA), and cerebral infarction without residual deficits: Secondary | ICD-10-CM | POA: Diagnosis not present

## 2024-02-19 DIAGNOSIS — M549 Dorsalgia, unspecified: Secondary | ICD-10-CM | POA: Diagnosis not present

## 2024-02-19 DIAGNOSIS — K449 Diaphragmatic hernia without obstruction or gangrene: Secondary | ICD-10-CM | POA: Insufficient documentation

## 2024-02-19 DIAGNOSIS — E785 Hyperlipidemia, unspecified: Secondary | ICD-10-CM | POA: Insufficient documentation

## 2024-02-19 DIAGNOSIS — J449 Chronic obstructive pulmonary disease, unspecified: Secondary | ICD-10-CM | POA: Insufficient documentation

## 2024-02-19 DIAGNOSIS — E538 Deficiency of other specified B group vitamins: Secondary | ICD-10-CM | POA: Diagnosis not present

## 2024-02-19 DIAGNOSIS — D649 Anemia, unspecified: Secondary | ICD-10-CM | POA: Diagnosis not present

## 2024-02-19 DIAGNOSIS — K5900 Constipation, unspecified: Secondary | ICD-10-CM | POA: Insufficient documentation

## 2024-02-19 DIAGNOSIS — R531 Weakness: Secondary | ICD-10-CM | POA: Diagnosis not present

## 2024-02-19 DIAGNOSIS — Z7982 Long term (current) use of aspirin: Secondary | ICD-10-CM | POA: Insufficient documentation

## 2024-02-19 DIAGNOSIS — I6389 Other cerebral infarction: Principal | ICD-10-CM | POA: Insufficient documentation

## 2024-02-19 DIAGNOSIS — Z950 Presence of cardiac pacemaker: Secondary | ICD-10-CM | POA: Insufficient documentation

## 2024-02-19 DIAGNOSIS — R001 Bradycardia, unspecified: Secondary | ICD-10-CM

## 2024-02-19 DIAGNOSIS — I6782 Cerebral ischemia: Secondary | ICD-10-CM | POA: Diagnosis not present

## 2024-02-19 DIAGNOSIS — G9389 Other specified disorders of brain: Secondary | ICD-10-CM | POA: Diagnosis not present

## 2024-02-19 DIAGNOSIS — K219 Gastro-esophageal reflux disease without esophagitis: Secondary | ICD-10-CM | POA: Diagnosis not present

## 2024-02-19 DIAGNOSIS — I440 Atrioventricular block, first degree: Secondary | ICD-10-CM | POA: Insufficient documentation

## 2024-02-19 DIAGNOSIS — Z7902 Long term (current) use of antithrombotics/antiplatelets: Secondary | ICD-10-CM | POA: Insufficient documentation

## 2024-02-19 DIAGNOSIS — G319 Degenerative disease of nervous system, unspecified: Secondary | ICD-10-CM | POA: Diagnosis not present

## 2024-02-19 DIAGNOSIS — I129 Hypertensive chronic kidney disease with stage 1 through stage 4 chronic kidney disease, or unspecified chronic kidney disease: Secondary | ICD-10-CM | POA: Insufficient documentation

## 2024-02-19 DIAGNOSIS — E872 Acidosis, unspecified: Secondary | ICD-10-CM | POA: Diagnosis not present

## 2024-02-19 DIAGNOSIS — R42 Dizziness and giddiness: Secondary | ICD-10-CM | POA: Diagnosis not present

## 2024-02-19 DIAGNOSIS — N1831 Chronic kidney disease, stage 3a: Secondary | ICD-10-CM | POA: Diagnosis not present

## 2024-02-19 DIAGNOSIS — R262 Difficulty in walking, not elsewhere classified: Secondary | ICD-10-CM | POA: Diagnosis present

## 2024-02-19 DIAGNOSIS — R269 Unspecified abnormalities of gait and mobility: Secondary | ICD-10-CM

## 2024-02-19 DIAGNOSIS — W19XXXA Unspecified fall, initial encounter: Secondary | ICD-10-CM | POA: Diagnosis not present

## 2024-02-19 DIAGNOSIS — I639 Cerebral infarction, unspecified: Principal | ICD-10-CM | POA: Diagnosis present

## 2024-02-19 DIAGNOSIS — R339 Retention of urine, unspecified: Secondary | ICD-10-CM | POA: Insufficient documentation

## 2024-02-19 LAB — URINALYSIS, ROUTINE W REFLEX MICROSCOPIC
Bilirubin Urine: NEGATIVE
Glucose, UA: NEGATIVE mg/dL
Ketones, ur: NEGATIVE mg/dL
Leukocytes,Ua: NEGATIVE
Nitrite: NEGATIVE
Protein, ur: NEGATIVE mg/dL
Specific Gravity, Urine: 1.011 (ref 1.005–1.030)
pH: 6 (ref 5.0–8.0)

## 2024-02-19 LAB — CBC WITH DIFFERENTIAL/PLATELET
Abs Immature Granulocytes: 0.03 10*3/uL (ref 0.00–0.07)
Basophils Absolute: 0 10*3/uL (ref 0.0–0.1)
Basophils Relative: 0 %
Eosinophils Absolute: 0 10*3/uL (ref 0.0–0.5)
Eosinophils Relative: 0 %
HCT: 33.5 % — ABNORMAL LOW (ref 36.0–46.0)
Hemoglobin: 10.3 g/dL — ABNORMAL LOW (ref 12.0–15.0)
Immature Granulocytes: 0 %
Lymphocytes Relative: 17 %
Lymphs Abs: 1.3 10*3/uL (ref 0.7–4.0)
MCH: 26.7 pg (ref 26.0–34.0)
MCHC: 30.7 g/dL (ref 30.0–36.0)
MCV: 86.8 fL (ref 80.0–100.0)
Monocytes Absolute: 0.8 10*3/uL (ref 0.1–1.0)
Monocytes Relative: 11 %
Neutro Abs: 5.3 10*3/uL (ref 1.7–7.7)
Neutrophils Relative %: 72 %
Platelets: 192 10*3/uL (ref 150–400)
RBC: 3.86 MIL/uL — ABNORMAL LOW (ref 3.87–5.11)
RDW: 16.7 % — ABNORMAL HIGH (ref 11.5–15.5)
WBC: 7.4 10*3/uL (ref 4.0–10.5)
nRBC: 0 % (ref 0.0–0.2)

## 2024-02-19 LAB — I-STAT CHEM 8, ED
BUN: 30 mg/dL — ABNORMAL HIGH (ref 8–23)
Calcium, Ion: 1.16 mmol/L (ref 1.15–1.40)
Chloride: 116 mmol/L — ABNORMAL HIGH (ref 98–111)
Creatinine, Ser: 1 mg/dL (ref 0.44–1.00)
Glucose, Bld: 88 mg/dL (ref 70–99)
HCT: 32 % — ABNORMAL LOW (ref 36.0–46.0)
Hemoglobin: 10.9 g/dL — ABNORMAL LOW (ref 12.0–15.0)
Potassium: 3.8 mmol/L (ref 3.5–5.1)
Sodium: 145 mmol/L (ref 135–145)
TCO2: 20 mmol/L — ABNORMAL LOW (ref 22–32)

## 2024-02-19 LAB — HEMOGLOBIN AND HEMATOCRIT, BLOOD
HCT: 35.8 % — ABNORMAL LOW (ref 36.0–46.0)
Hemoglobin: 10.9 g/dL — ABNORMAL LOW (ref 12.0–15.0)

## 2024-02-19 LAB — RAPID URINE DRUG SCREEN, HOSP PERFORMED
Amphetamines: NOT DETECTED
Barbiturates: NOT DETECTED
Benzodiazepines: NOT DETECTED
Cocaine: NOT DETECTED
Opiates: NOT DETECTED
Tetrahydrocannabinol: NOT DETECTED

## 2024-02-19 LAB — COMPREHENSIVE METABOLIC PANEL WITH GFR
ALT: 14 U/L (ref 0–44)
AST: 16 U/L (ref 15–41)
Albumin: 3.3 g/dL — ABNORMAL LOW (ref 3.5–5.0)
Alkaline Phosphatase: 54 U/L (ref 38–126)
Anion gap: 8 (ref 5–15)
BUN: 26 mg/dL — ABNORMAL HIGH (ref 8–23)
CO2: 19 mmol/L — ABNORMAL LOW (ref 22–32)
Calcium: 8.5 mg/dL — ABNORMAL LOW (ref 8.9–10.3)
Chloride: 115 mmol/L — ABNORMAL HIGH (ref 98–111)
Creatinine, Ser: 0.9 mg/dL (ref 0.44–1.00)
GFR, Estimated: 60 mL/min — ABNORMAL LOW (ref >60)
Glucose, Bld: 96 mg/dL (ref 70–99)
Potassium: 3.4 mmol/L — ABNORMAL LOW (ref 3.5–5.1)
Sodium: 142 mmol/L (ref 135–145)
Total Bilirubin: 0.6 mg/dL (ref 0.0–1.2)
Total Protein: 6 g/dL — ABNORMAL LOW (ref 6.5–8.1)

## 2024-02-19 LAB — BLOOD GAS, VENOUS
Acid-base deficit: 2.3 mmol/L — ABNORMAL HIGH (ref 0.0–2.0)
Bicarbonate: 22.5 mmol/L (ref 20.0–28.0)
O2 Saturation: 54.4 %
Patient temperature: 37
pCO2, Ven: 38 mmHg — ABNORMAL LOW (ref 44–60)
pH, Ven: 7.38 (ref 7.25–7.43)
pO2, Ven: 33 mmHg (ref 32–45)

## 2024-02-19 LAB — LIPID PANEL
Cholesterol: 139 mg/dL (ref 0–200)
HDL: 60 mg/dL (ref >40)
LDL Cholesterol: 55 mg/dL (ref 0–99)
Total CHOL/HDL Ratio: 2.3 ratio
Triglycerides: 118 mg/dL (ref <150)
VLDL: 24 mg/dL (ref 0–40)

## 2024-02-19 LAB — ETHANOL: Alcohol, Ethyl (B): <15 mg/dL (ref <15)

## 2024-02-19 LAB — HEMOGLOBIN A1C
Hgb A1c MFr Bld: 5.7 % — ABNORMAL HIGH (ref 4.8–5.6)
Mean Plasma Glucose: 116.89 mg/dL

## 2024-02-19 LAB — D-DIMER, QUANTITATIVE: D-Dimer, Quant: 0.53 ug{FEU}/mL — ABNORMAL HIGH (ref 0.00–0.50)

## 2024-02-19 LAB — TROPONIN I (HIGH SENSITIVITY): Troponin I (High Sensitivity): 5 ng/L (ref <18)

## 2024-02-19 MED ORDER — STROKE: EARLY STAGES OF RECOVERY BOOK
Freq: Once | Status: AC
  Filled 2024-02-19 (×2): qty 1

## 2024-02-19 MED ORDER — SODIUM CHLORIDE 0.9 % IV BOLUS
500.0000 mL | Freq: Once | INTRAVENOUS | Status: AC
  Administered 2024-02-19: 500 mL via INTRAVENOUS

## 2024-02-19 MED ORDER — MECLIZINE HCL 25 MG PO TABS
25.0000 mg | ORAL_TABLET | Freq: Once | ORAL | Status: AC
  Administered 2024-02-19: 25 mg via ORAL
  Filled 2024-02-19: qty 1

## 2024-02-19 MED ORDER — FENTANYL CITRATE PF 50 MCG/ML IJ SOSY: 50.0000 ug | PREFILLED_SYRINGE | Freq: Once | INTRAMUSCULAR | Status: DC

## 2024-02-19 MED ORDER — ENOXAPARIN SODIUM 30 MG/0.3ML IJ SOSY
30.0000 mg | PREFILLED_SYRINGE | Freq: Every day | INTRAMUSCULAR | Status: DC
  Administered 2024-02-19 – 2024-02-21 (×3): 30 mg via SUBCUTANEOUS
  Filled 2024-02-19 (×3): qty 0.3

## 2024-02-19 MED ORDER — POTASSIUM CHLORIDE CRYS ER 20 MEQ PO TBCR
40.0000 meq | EXTENDED_RELEASE_TABLET | Freq: Once | ORAL | Status: AC
  Administered 2024-02-19: 40 meq via ORAL
  Filled 2024-02-19: qty 2

## 2024-02-19 NOTE — ED Notes (Signed)
 Called lab to request add-on of additional tests as ordered.

## 2024-02-19 NOTE — ED Notes (Signed)
 MD Goldson at bedside

## 2024-02-19 NOTE — ED Provider Notes (Signed)
 [place] EMERGENCY DEPARTMENT AT [facility] Provider Note   CSN: [id number] Arrival date & time: 02/19/24  1148     History  Chief Complaint  Patient presents with   Weakness   Dizziness    Tina Mercado is a 89 y.o. female.  HPI 89 year old female presents with difficulty walking.  She chronically uses a rollator and chronically has balance issues but over the last 2 days she has noticed increase difficulty with ambulating with her walker.  She states she feels like she is leaning though not in 1 particular way.  She feels more off balance than typical.  She also feels like her handwriting and tying her shoes are more difficult than typical.  She has had this before and associated with a mini stroke before.  She states she was dizzy several months ago but no further dizziness.  No headaches, chest pain, shortness of breath.  She states that for several hours yesterday she would have pleuritic back pain but that is no longer present.  No focal weakness or numbness.  No falls.  Home Medications Prior to Admission medications   Medication Sig Start Date End Date Taking? Authorizing Provider  albuterol  (VENTOLIN  HFA) 108 (90 Base) MCG/ACT inhaler Inhale 2 puffs into the lungs every 6 (six) hours as needed for wheezing or shortness of breath. 04/03/23   Mast, Man X, NP  amitriptyline  (ELAVIL ) 10 MG tablet TAKE 1 TABLET BY MOUTH EVERYDAY AT BEDTIME 04/03/23   Mast, Man X, NP  aspirin  EC 81 MG tablet Take 1 tablet (81 mg total) by mouth daily. Swallow whole. 05/06/22   Gonfa, Taye T, MD  bisacodyl  (DULCOLAX) 10 MG suppository Place 1 suppository (10 mg total) rectally daily as needed for moderate constipation. 04/03/23   Mast, Man X, NP  budesonide -formoterol  (SYMBICORT ) 80-4.5 MCG/ACT inhaler Inhale 2 puffs into the lungs 2 (two) times daily. 02/03/24   Tye Gall, MD  Cholecalciferol  (VITAMIN D3) 50 MCG (2000 UT) TABS TAKE 1 TABLET (2,000 UNITS TOTAL) BY MOUTH AT BEDTIME.  05/08/23   Doreen Gamma, MD  clopidogrel  (PLAVIX ) 75 MG tablet TAKE 1 TABLET BY MOUTH EVERY DAY 10/26/23   Tye Gall, MD  docusate sodium  (COLACE) 100 MG capsule Take 1-2 capsules (100-200 mg total) by mouth See admin instructions. Take 100-200 mg by mouth at bedtime and HOLD FOR DIARRHEA 04/03/23   Mast, Man X, NP  ketorolac  (ACULAR ) 0.5 % ophthalmic solution INSTILL 1 DROP INTO RIGHT EYE TWICE A DAY 08/03/23   Tye Gall, MD  nystatin  (MYCOSTATIN ) 100000 UNIT/ML suspension Take 5 mLs (500,000 Units total) by mouth 4 (four) times daily. 12/22/23   Tye Gall, MD  rosuvastatin  (CRESTOR ) 40 MG tablet TAKE 1 TABLET BY MOUTH EVERY DAY 09/29/23   Tye Gall, MD  timolol  (TIMOPTIC ) 0.5 % ophthalmic solution INSTILL 1 DROP INTO BOTH EYES TWICE A DAY 10/26/23   Tye Gall, MD      Allergies    Contrast media [iodinated contrast media]; Oscal [oyster shell calcium -d]; Wixela inhub [fluticasone -salmeterol]; Antihistamines, diphenhydramine-type; Celecoxib; Clemastine fumarate; Darvocet [propoxyphene n-acetaminophen ]; Diphenhydramine hcl; Feldene [piroxicam]; Gabapentin ; Meperidine; Norvasc  [amlodipine  besylate]; Other; Sulfa antibiotics; Tea; and Iodine-131    Review of Systems   Review of Systems  Respiratory:  Negative for shortness of breath.   Cardiovascular:  Negative for chest pain.  Gastrointestinal:  Negative for vomiting.  Musculoskeletal:  Positive for back pain and gait problem.  Neurological:  Negative for weakness, numbness and headaches.  Physical Exam Updated Vital Signs BP (!) 152/119 (BP Location: Left Arm)   Pulse 65   Temp 97.6 F (36.4 C) (Oral)   Resp (!) 22   Ht 5\' 2"  (1.575 m)   Wt 57.2 kg   LMP  (LMP Unknown)   SpO2 98%   BMI 23.06 kg/m  Physical Exam Vitals and nursing note reviewed.  Constitutional:      Appearance: She is well-developed.  HENT:     Head: Normocephalic and atraumatic.  Eyes:      Extraocular Movements: Extraocular movements intact.     Pupils: Pupils are equal, round, and reactive to light.  Cardiovascular:     Rate and Rhythm: Normal rate and regular rhythm.     Heart sounds: Normal heart sounds.  Pulmonary:     Effort: Pulmonary effort is normal.     Breath sounds: Normal breath sounds.  Abdominal:     Palpations: Abdomen is soft.     Tenderness: There is no abdominal tenderness.  Musculoskeletal:     Cervical back: No rigidity.  Skin:    General: Skin is warm and dry.  Neurological:     Mental Status: She is alert.     Comments: CN 3-12 grossly intact. 5/5 strength in all 4 extremities. Grossly normal sensation. Normal finger to nose. Normal heel to shin.     ED Results / Procedures / Treatments   Labs (all labs ordered are listed, but only abnormal results are displayed) Labs Reviewed  CBC WITH DIFFERENTIAL/PLATELET - Abnormal; Notable for the following components:      Result Value   RBC 3.86 (*)    Hemoglobin 10.3 (*)    HCT 33.5 (*)    RDW 16.7 (*)    All other components within normal limits  COMPREHENSIVE METABOLIC PANEL WITH GFR - Abnormal; Notable for the following components:   Potassium 3.4 (*)    Chloride 115 (*)    CO2 19 (*)    BUN 26 (*)    Calcium  8.5 (*)    Total Protein 6.0 (*)    Albumin 3.3 (*)    GFR, Estimated 60 (*)    All other components within normal limits  D-DIMER, QUANTITATIVE - Abnormal; Notable for the following components:   D-Dimer, Quant 0.53 (*)    All other components within normal limits  I-STAT CHEM 8, ED - Abnormal; Notable for the following components:   Chloride 116 (*)    BUN 30 (*)    TCO2 20 (*)    Hemoglobin 10.9 (*)    HCT 32.0 (*)    All other components within normal limits  ETHANOL  RAPID URINE DRUG SCREEN, HOSP PERFORMED  URINALYSIS, ROUTINE W REFLEX MICROSCOPIC  HEMOGLOBIN AND HEMATOCRIT, BLOOD  TROPONIN I (HIGH SENSITIVITY)    EKG EKG Interpretation Date/Time:  Friday February 19 2024 12:28:11 EDT Ventricular Rate:  53 PR Interval:  226 QRS Duration:  88 QT Interval:  426 QTC Calculation: 400 R Axis:   34  Text Interpretation: Sinus bradycardia Prolonged PR interval Confirmed by Jerilynn Montenegro [phone]) on 02/19/2024 1:03:53 PM  Radiology CT HEAD WO CONTRAST Result Date: 02/19/2024 CLINICAL DATA:  Neurologic deficit.  Stroke suspected. EXAM: CT HEAD WITHOUT CONTRAST TECHNIQUE: Contiguous axial images were obtained from the base of the skull through the vertex without intravenous contrast. RADIATION DOSE REDUCTION: This exam was performed according to the departmental dose-optimization program which includes automated exposure control, adjustment of the mA and/or kV according to  patient size and/or use of iterative reconstruction technique. COMPARISON:  Head CT dated 11/18/2023. FINDINGS: Brain: Moderate age-related atrophy and chronic microvascular ischemic changes. Old right MCA territory infarct. There is no acute intracranial hemorrhage. No mass effect or midline shift. No extra-axial fluid collection. Vascular: No hyperdense vessel or unexpected calcification. Skull: Normal. Negative for fracture or focal lesion. Sinuses/Orbits: No acute finding. Other: None IMPRESSION: 1. No acute intracranial pathology. 2. Moderate age-related atrophy and chronic microvascular ischemic changes. Old right MCA territory infarct. Electronically Signed   By: Angus Bark M.D.   On: 02/19/2024 14:13   DG Chest Portable 1 View Result Date: 02/19/2024 CLINICAL DATA:  Transient back pain with weakness and dizziness. Generalized weakness for the past 2 days. EXAM: PORTABLE CHEST 1 VIEW COMPARISON:  02/25/2023 FINDINGS: Stable enlarged cardiac silhouette, tortuous and partially calcified thoracic aorta, loop recorder and large hiatal hernia. Mild linear atelectasis/scarring at the left lung base. The remainder of the lungs are clear, hyperexpanded and have normal vascularity. Diffuse osteopenia.  IMPRESSION: 1. No acute abnormality. 2. COPD. 3. Stable cardiomegaly. 4. Large hiatal hernia. Electronically Signed   By: Catherin Closs M.D.   On: 02/19/2024 13:44    Procedures Procedures    Medications Ordered in ED Medications  sodium chloride  0.9 % bolus 500 mL (500 mLs Intravenous New Bag/Given 02/19/24 1418)    ED Course/ Medical Decision Making/ A&P                                 Medical Decision Making Amount and/or Complexity of Data Reviewed Labs: ordered.    Details: Mild anemia.  Age-adjusted D-dimer negative.  Troponin negative. Radiology: ordered and independent interpretation performed.    Details: No head bleed. ECG/medicine tests: ordered and independent interpretation performed.    Details: No ischemia   Neuroexam while in bed is unremarkable.  Labs show a mild anemia though she has had similar values before.  Mild hypokalemia.  Her D-dimer is age-adjusted negative and I doubt PE in this situation, especially as her transient back pain went away.  No chest pain.  Troponin negative and her symptoms started over a day ago.  Getting an MRI given prior history of stroke and her symptoms, this workup is currently pending.  Care transferred to Dr. Florie Husband.        Final Clinical Impression(s) / ED Diagnoses Final diagnoses:  None    Rx / DC Orders ED Discharge Orders     None         Jerilynn Montenegro, MD 02/19/24 1640

## 2024-02-19 NOTE — ED Triage Notes (Signed)
 BIBA from her ALF- Reports generalized weakness for 2 days. AAOx4. Pt hypertensive, bradycardic in route to ED. Hx of TIA in past. Negative on EMS stroke assessment. Denies chest pain. Endorses feeling like she wants to pass out. Ambulatory with assist on scene.

## 2024-02-19 NOTE — ED Notes (Signed)
 Confirmed that pt came from [facility].

## 2024-02-19 NOTE — ED Notes (Signed)
 Pt is safe to go to MRI without an RN present, verified by primary RN and additional RN present.

## 2024-02-19 NOTE — ED Notes (Signed)
 Output-950

## 2024-02-19 NOTE — ED Notes (Signed)
 Patient transported to MRI
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
## 2024-02-19 NOTE — ED Provider Notes (Signed)
  Physical Exam  BP (!) 185/58   Pulse (!) 51   Temp (!) 97.5 F (36.4 C) (Oral)   Resp 15   Ht 5\' 2"  (1.575 m)   Wt 57.2 kg   LMP  (LMP Unknown)   SpO2 95%   BMI 23.06 kg/m   Physical Exam  Procedures  .Critical Care  Performed by: Nathanael Baker, DO Authorized by: Nathanael Baker, DO   Critical care provider statement:    Critical care time (minutes):  33   Critical care was necessary to treat or prevent imminent or life-threatening deterioration of the following conditions:  CNS failure or compromise   Critical care was time spent personally by me on the following activities:  Development of treatment plan with patient or surrogate, discussions with consultants, evaluation of patient's response to treatment, examination of patient, ordering and review of laboratory studies, ordering and review of radiographic studies, ordering and performing treatments and interventions, pulse oximetry, re-evaluation of patient's condition and review of old charts   ED Course / MDM    Medical Decision Making Amount and/or Complexity of Data Reviewed Labs: ordered. Radiology: ordered.   Melody Spurling, assumed care for this patient.  In brief, this is a 89 year old female came to the emergency room for trouble with her balance.  Patient was signed out pending MRI.  MRI did show frontal infarct.  Spoke with neurology, Dr. Sal.  He recommended admission to [facility].  Will admit patient       Nathanael Baker, DO 02/19/24 1918

## 2024-02-19 NOTE — ED Notes (Signed)
 Pt reports that she feels like she is not completely emptying her bladder when she voids. Bladder scan performed; volume . MD notified.

## 2024-02-20 ENCOUNTER — Encounter (HOSPITAL_COMMUNITY): Payer: Self-pay | Admitting: Internal Medicine

## 2024-02-20 ENCOUNTER — Observation Stay (HOSPITAL_COMMUNITY)

## 2024-02-20 DIAGNOSIS — I6629 Occlusion and stenosis of unspecified posterior cerebral artery: Secondary | ICD-10-CM | POA: Diagnosis not present

## 2024-02-20 DIAGNOSIS — D259 Leiomyoma of uterus, unspecified: Secondary | ICD-10-CM | POA: Diagnosis not present

## 2024-02-20 DIAGNOSIS — Z79899 Other long term (current) drug therapy: Secondary | ICD-10-CM | POA: Diagnosis not present

## 2024-02-20 DIAGNOSIS — I129 Hypertensive chronic kidney disease with stage 1 through stage 4 chronic kidney disease, or unspecified chronic kidney disease: Secondary | ICD-10-CM | POA: Diagnosis not present

## 2024-02-20 DIAGNOSIS — I672 Cerebral atherosclerosis: Secondary | ICD-10-CM | POA: Diagnosis not present

## 2024-02-20 DIAGNOSIS — I771 Stricture of artery: Secondary | ICD-10-CM | POA: Diagnosis not present

## 2024-02-20 DIAGNOSIS — D649 Anemia, unspecified: Secondary | ICD-10-CM

## 2024-02-20 DIAGNOSIS — R001 Bradycardia, unspecified: Secondary | ICD-10-CM | POA: Diagnosis not present

## 2024-02-20 DIAGNOSIS — J449 Chronic obstructive pulmonary disease, unspecified: Secondary | ICD-10-CM | POA: Diagnosis not present

## 2024-02-20 DIAGNOSIS — K8020 Calculus of gallbladder without cholecystitis without obstruction: Secondary | ICD-10-CM | POA: Diagnosis not present

## 2024-02-20 DIAGNOSIS — K449 Diaphragmatic hernia without obstruction or gangrene: Secondary | ICD-10-CM | POA: Diagnosis not present

## 2024-02-20 DIAGNOSIS — E785 Hyperlipidemia, unspecified: Secondary | ICD-10-CM | POA: Diagnosis not present

## 2024-02-20 DIAGNOSIS — K5900 Constipation, unspecified: Secondary | ICD-10-CM | POA: Diagnosis not present

## 2024-02-20 DIAGNOSIS — Z8673 Personal history of transient ischemic attack (TIA), and cerebral infarction without residual deficits: Secondary | ICD-10-CM | POA: Diagnosis not present

## 2024-02-20 DIAGNOSIS — I639 Cerebral infarction, unspecified: Secondary | ICD-10-CM | POA: Diagnosis not present

## 2024-02-20 DIAGNOSIS — K219 Gastro-esophageal reflux disease without esophagitis: Secondary | ICD-10-CM | POA: Diagnosis not present

## 2024-02-20 DIAGNOSIS — Z7902 Long term (current) use of antithrombotics/antiplatelets: Secondary | ICD-10-CM | POA: Diagnosis not present

## 2024-02-20 DIAGNOSIS — I10 Essential (primary) hypertension: Secondary | ICD-10-CM

## 2024-02-20 DIAGNOSIS — E876 Hypokalemia: Secondary | ICD-10-CM | POA: Diagnosis not present

## 2024-02-20 DIAGNOSIS — I440 Atrioventricular block, first degree: Secondary | ICD-10-CM | POA: Diagnosis not present

## 2024-02-20 DIAGNOSIS — E872 Acidosis, unspecified: Secondary | ICD-10-CM | POA: Diagnosis not present

## 2024-02-20 DIAGNOSIS — Z7982 Long term (current) use of aspirin: Secondary | ICD-10-CM | POA: Diagnosis not present

## 2024-02-20 DIAGNOSIS — R339 Retention of urine, unspecified: Secondary | ICD-10-CM | POA: Diagnosis not present

## 2024-02-20 DIAGNOSIS — I6389 Other cerebral infarction: Secondary | ICD-10-CM | POA: Diagnosis not present

## 2024-02-20 DIAGNOSIS — N1831 Chronic kidney disease, stage 3a: Secondary | ICD-10-CM | POA: Diagnosis not present

## 2024-02-20 DIAGNOSIS — Z950 Presence of cardiac pacemaker: Secondary | ICD-10-CM | POA: Diagnosis not present

## 2024-02-20 DIAGNOSIS — R262 Difficulty in walking, not elsewhere classified: Secondary | ICD-10-CM | POA: Diagnosis present

## 2024-02-20 DIAGNOSIS — E538 Deficiency of other specified B group vitamins: Secondary | ICD-10-CM | POA: Diagnosis not present

## 2024-02-20 DIAGNOSIS — M549 Dorsalgia, unspecified: Secondary | ICD-10-CM | POA: Diagnosis not present

## 2024-02-20 LAB — BASIC METABOLIC PANEL WITH GFR
Anion gap: 8 (ref 5–15)
BUN: 23 mg/dL (ref 8–23)
CO2: 19 mmol/L — ABNORMAL LOW (ref 22–32)
Calcium: 8.4 mg/dL — ABNORMAL LOW (ref 8.9–10.3)
Chloride: 115 mmol/L — ABNORMAL HIGH (ref 98–111)
Creatinine, Ser: 1.01 mg/dL — ABNORMAL HIGH (ref 0.44–1.00)
GFR, Estimated: 52 mL/min — ABNORMAL LOW (ref >60)
Glucose, Bld: 93 mg/dL (ref 70–99)
Potassium: 3.7 mmol/L (ref 3.5–5.1)
Sodium: 142 mmol/L (ref 135–145)

## 2024-02-20 LAB — LIPID PANEL
Cholesterol: 128 mg/dL (ref 0–200)
HDL: 50 mg/dL (ref >40)
LDL Cholesterol: 47 mg/dL (ref 0–99)
Total CHOL/HDL Ratio: 2.6 ratio
Triglycerides: 156 mg/dL — ABNORMAL HIGH (ref <150)
VLDL: 31 mg/dL (ref 0–40)

## 2024-02-20 LAB — CBC
HCT: 34.8 % — ABNORMAL LOW (ref 36.0–46.0)
Hemoglobin: 10.8 g/dL — ABNORMAL LOW (ref 12.0–15.0)
MCH: 26.9 pg (ref 26.0–34.0)
MCHC: 31 g/dL (ref 30.0–36.0)
MCV: 86.8 fL (ref 80.0–100.0)
Platelets: 198 10*3/uL (ref 150–400)
RBC: 4.01 MIL/uL (ref 3.87–5.11)
RDW: 16.7 % — ABNORMAL HIGH (ref 11.5–15.5)
WBC: 8.3 10*3/uL (ref 4.0–10.5)
nRBC: 0 % (ref 0.0–0.2)

## 2024-02-20 LAB — TSH: TSH: 6.733 u[IU]/mL — ABNORMAL HIGH (ref 0.350–4.500)

## 2024-02-20 LAB — MAGNESIUM: Magnesium: 2.1 mg/dL (ref 1.7–2.4)

## 2024-02-20 MED ORDER — HYDRALAZINE HCL 20 MG/ML IJ SOLN: 10.0000 mg | Freq: Four times a day (QID) | INTRAMUSCULAR | Status: DC | PRN

## 2024-02-20 MED ORDER — ACETAMINOPHEN 325 MG PO TABS
650.0000 mg | ORAL_TABLET | Freq: Four times a day (QID) | ORAL | Status: DC | PRN
  Administered 2024-02-20 – 2024-02-21 (×3): 650 mg via ORAL
  Filled 2024-02-20 (×3): qty 2

## 2024-02-20 MED ORDER — ATORVASTATIN CALCIUM 40 MG PO TABS
40.0000 mg | ORAL_TABLET | Freq: Every day | ORAL | Status: DC
  Filled 2024-02-20: qty 1

## 2024-02-20 MED ORDER — ROSUVASTATIN CALCIUM 20 MG PO TABS
20.0000 mg | ORAL_TABLET | Freq: Every day | ORAL | Status: DC
  Administered 2024-02-21 – 2024-02-22 (×2): 20 mg via ORAL
  Filled 2024-02-20 (×2): qty 1

## 2024-02-20 MED ORDER — GADOBUTROL 1 MMOL/ML IV SOLN
6.0000 mL | Freq: Once | INTRAVENOUS | Status: AC | PRN
  Administered 2024-02-20: 6 mL via INTRAVENOUS

## 2024-02-20 MED ORDER — LOSARTAN POTASSIUM 50 MG PO TABS
50.0000 mg | ORAL_TABLET | Freq: Every day | ORAL | Status: DC
  Administered 2024-02-21: 50 mg via ORAL
  Filled 2024-02-20: qty 1

## 2024-02-20 MED ORDER — CLOPIDOGREL BISULFATE 75 MG PO TABS
75.0000 mg | ORAL_TABLET | Freq: Every day | ORAL | Status: DC
  Administered 2024-02-20 – 2024-02-22 (×3): 75 mg via ORAL
  Filled 2024-02-20 (×3): qty 1

## 2024-02-20 MED ORDER — LOSARTAN POTASSIUM 50 MG PO TABS
100.0000 mg | ORAL_TABLET | Freq: Every day | ORAL | Status: DC
  Administered 2024-02-20: 100 mg via ORAL
  Filled 2024-02-20: qty 2

## 2024-02-20 MED ORDER — ASPIRIN 81 MG PO TBEC
81.0000 mg | DELAYED_RELEASE_TABLET | Freq: Every day | ORAL | Status: DC
  Administered 2024-02-20 – 2024-02-22 (×3): 81 mg via ORAL
  Filled 2024-02-20 (×3): qty 1

## 2024-02-20 NOTE — Evaluation (Signed)
 Physical Therapy Evaluation  Patient Details Name: Tina Mercado MRN: [id number] DOB: Jan 07, 1929 Today's Date: 02/20/2024  History of Present Illness  Pt is a 89 y/o female who presents 02/19/2024 with CVA. MRI revealed single punctate 4 mm acute ischemic nonhemorrhagic infarct  involving the high posterior left frontal lobe, precentral gyrus. PMH significant for vertigo/dizziness and imbalance, Arrhythmia, COPD, cataract, glaucoma, heart murmur, HTN, mitral valve regurgitation, osteoporosis, TIA, corneal transplant, IM nail 2020 L.   Clinical Impression  Pt admitted with above diagnosis. Pt reports feeling dizzy throughout session, reporting it feels like the world is moving around her, but denies spinning or vertigo sensation. Pt is cautious with head movement with standing/walking activity due to it increasing her imbalance. Pt also states this is not new and is unchanged "for a few years".  Anticipate pt is near baseline of function, however is requiring hands on guarding and assist for balance during standing activity/ambulation. Pt currently with functional limitations due to the deficits listed below (see PT Problem List). Pt will benefit from acute skilled PT to increase their independence and safety with mobility to allow discharge.           If plan is discharge home, recommend the following: A little help with walking and/or transfers;A little help with bathing/dressing/bathroom;Assistance with cooking/housework;Assist for transportation   Can travel by private vehicle        Equipment Recommendations None recommended by PT  Recommendations for Other Services       Functional Status Assessment Patient has had a recent decline in their functional status and demonstrates the ability to make significant improvements in function in a reasonable and predictable amount of time.     Precautions / Restrictions Precautions Precautions: Fall Recall of Precautions/Restrictions:  Intact Restrictions Weight Bearing Restrictions Per Provider Order: No      Mobility  Bed Mobility               General bed mobility comments: Received sitting up EOB with OT present    Transfers Overall transfer level: Needs assistance Equipment used: Rollator (4 wheels) Transfers: Sit to/from Stand Sit to Stand: Min assist           General transfer comment: Assist for power up to full stand and to gain/maintain standing balance.    Ambulation/Gait Ambulation/Gait assistance: Contact guard assist, Min assist Gait Distance (Feet): 200 Feet Assistive device: Rollator (4 wheels) Gait Pattern/deviations: Step-through pattern, Decreased stride length, Trunk flexed Gait velocity: Decreased     General Gait Details: Slow and guarded with mild instability noted when pt looking around hallway. She is mindful of not turning her head too much to avoid feeling off balance. States this is not new and has been her baseline "for a few years". Occasional assist due to unsteadiness required.  Stairs            Wheelchair Mobility     Tilt Bed    Modified Rankin (Stroke Patients Only) Modified Rankin (Stroke Patients Only) Pre-Morbid Rankin Score: Moderate disability Modified Rankin: Moderately severe disability     Balance Overall balance assessment: Needs assistance Sitting-balance support: Feet supported Sitting balance-Leahy Scale: Good Sitting balance - Comments: reaches outside BOS without LOB   Standing balance support: During functional activity, Reliant on assistive device for balance Standing balance-Leahy Scale: Fair Standing balance comment: static standing without AD  Pertinent Vitals/Pain Pain Assessment Pain Assessment: No/denies pain    Home Living Family/patient expects to be discharged to::  ([facility])                   Additional Comments: Studio style apartment with Engineer, structural. Has  a shower seat.    Prior Function Prior Level of Function : Independent/Modified Independent             Mobility Comments: Rollator ADLs Comments: Has not showered recently due to feeling imbalanced; ind with ADLs, goes to cafeteria for meals     Extremity/Trunk Assessment   Upper Extremity Assessment Upper Extremity Assessment: Defer to OT evaluation    Lower Extremity Assessment Lower Extremity Assessment: Overall WFL for tasks assessed (Age appropriate strength with seated MMT)    Cervical / Trunk Assessment Cervical / Trunk Assessment: Kyphotic  Communication   Communication Communication: Impaired Factors Affecting Communication: Hearing impaired    Cognition Arousal: Alert Behavior During Therapy: WFL for tasks assessed/performed   PT - Cognitive impairments: No apparent impairments                         Following commands: Intact       Cueing Cueing Techniques: Verbal cues, Gestural cues     General Comments General comments (skin integrity, edema, etc.): VSS    Exercises     Assessment/Plan    PT Assessment Patient needs continued PT services  PT Problem List Decreased strength;Decreased activity tolerance;Decreased balance;Decreased mobility;Decreased knowledge of use of DME;Decreased safety awareness;Decreased knowledge of precautions;Pain       PT Treatment Interventions DME instruction;Gait training;Therapeutic activities;Functional mobility training;Therapeutic exercise;Balance training;Neuromuscular re-education;Patient/family education    PT Goals (Current goals can be found in the Care Plan section)  Acute Rehab PT Goals Patient Stated Goal: Be able to return to her independent living apartment. PT Goal Formulation: With patient Time For Goal Achievement: 02/27/24 Potential to Achieve Goals: Good    Frequency Min 3X/week     Co-evaluation               AM-PAC PT "6 Clicks" Mobility  Outcome Measure Help needed  turning from your back to your side while in a flat bed without using bedrails?: A Little Help needed moving from lying on your back to sitting on the side of a flat bed without using bedrails?: A Little Help needed moving to and from a bed to a chair (including a wheelchair)?: A Little Help needed standing up from a chair using your arms (e.g., wheelchair or bedside chair)?: A Little Help needed to walk in hospital room?: A Little Help needed climbing 3-5 steps with a railing? : A Lot 6 Click Score: 17    End of Session Equipment Utilized During Treatment: Gait belt Activity Tolerance: Patient tolerated treatment well Patient left: in chair;with call bell/phone within reach;with chair alarm set Nurse Communication: Mobility status PT Visit Diagnosis: Unsteadiness on feet (R26.81);Difficulty in walking, not elsewhere classified (R26.2)    Time: 9528-4132 PT Time Calculation (min) (ACUTE ONLY): 15 min   Charges:   PT Evaluation $PT Eval Moderate Complexity: 1 Mod   PT General Charges $$ ACUTE PT VISIT: 1 Visit         Simone Dubois, PT, DPT Acute Rehabilitation Services Secure Chat Preferred Office: [phone]   Venus Ginsberg 02/20/2024, 12:32 PM

## 2024-02-20 NOTE — Progress Notes (Signed)
 [facility] PROGRESS NOTE   Tina Mercado AYT:[id number] DOB: [birth year] DOA: 02/19/2024  PCP: Tye Gall, MD  Brief History: 89 y.o. female with medical history significant of COPD, hiatal hernia and GERD, hyperlipidemia, hypertension, stroke/TIA, anemia of chronic disease, CKD stage IIIa, hearing impairment presented to the ED with complaints of difficulty walking/trouble with balance and coordination and transient back pain but no falls reported.  Patient found to have acute stroke.  Patient was hospitalized for further management.   Consultants: Neurology  Procedures: Echocardiogram is pending    Subjective/Interval History: Patient complains of imbalance.  Denies any dizziness or lightheadedness.  Denies any chest pain or shortness of breath.  She also mentions that she has been urinating quite frequently for the past few weeks which has worsened in the past few days.  Denies any burning sensation while urinating.  She also feels that she is urinating large volume.    Assessment/Plan:  Acute ischemic stroke Patient was on aspirin  and Plavix  prior to admission. MRI brain has been completed. Patient also underwent MRA head and neck. LDL is noted to be 47.  Patient is already on statin. HbA1c is 5.7. TSH noted to be 6.7.  Will check free T4. Echocardiogram is pending.  PT and OT eval. Antiplatelets as per neurology.  She is on aspirin  and Plavix  currently. Due to complaints of imbalance we will check B12 level as well PT and OT evaluation.  Acute urinary retention Required In-N-Out catheterization in the emergency department.  UA does not suggest infection.  Small amount of blood is noted. Patient with urinary symptoms of frequent urination over the past few weeks.  The reason for this is not entirely clear.  Now with new urinary retention and presence of blood in the urine she may warrant further workup.  Will do CT renal study.  Essential  hypertension Allowing permissive hypertension.  Sinus bradycardia with first-degree AV block noted.  Normocytic anemia Hemoglobin is stable.  Check anemia panel.  Chronic metabolic acidosis Stable.  Etiology unclear.  Chronic kidney disease stage IIIa Stable.  History of COPD Stable.  Hypokalemia Supplemented.   DVT Prophylaxis: Lovenox  Code Status: DNR Family Communication: Discussed with patient Disposition Plan: She lives in an independent living facility  Status is: Observation The patient remains OBS appropriate and may or may not d/c before 2 midnights.      Medications: Scheduled:  aspirin  EC  81 mg Oral Daily   atorvastatin  40 mg Oral Daily   clopidogrel   75 mg Oral Daily   enoxaparin  (LOVENOX ) injection  30 mg Subcutaneous QHS   losartan   100 mg Oral Daily   Continuous: PRN:acetaminophen , hydrALAZINE   Antibiotics: Anti-infectives (From admission, onward)    None       Objective:  Vital Signs  Vitals:   02/20/24 0308 02/20/24 0426 02/20/24 0453 02/20/24 0750  BP: (!) 184/75 (!) 193/167 (!) 202/68 (!) 205/65  Pulse: 60 61 (!) 58 (!) 58  Resp: 18 17 18 18   Temp: 98 F (36.7 C) 98 F (36.7 C) 97.9 F (36.6 C) 97.8 F (36.6 C)  TempSrc:   Oral Oral  SpO2: 97% 94% 98% 97%  Weight:      Height:        Intake/Output Summary (Last 24 hours) at 02/20/2024 0955 Last data filed at 02/19/2024 1722 Gross per 24 hour  Intake 500 ml  Output --  Net 500 ml   Filed Weights   02/19/24 1206  Weight: 57.2 kg  General appearance: Awake alert.  In no distress Resp: Clear to auscultation bilaterally.  Normal effort Cardio: S1-S2 is normal regular.  No S3-S4.  No rubs murmurs or bruit GI: Abdomen is soft.  Nontender nondistended.  Bowel sounds are present normal.  No masses organomegaly Extremities: No edema.  Physical deconditioning is noted Neurologic: Alert and oriented x3.  No focal neurological deficits.    Lab Results:  Data Reviewed:  I have personally reviewed following labs and reports of the imaging studies  CBC: Recent Labs  Lab 02/19/24 1241 02/19/24 1249 02/19/24 1741 02/20/24 0330  WBC 7.4  --   --  8.3  NEUTROABS 5.3  --   --   --   HGB 10.3* 10.9* 10.9* 10.8*  HCT 33.5* 32.0* 35.8* 34.8*  MCV 86.8  --   --  86.8  PLT 192  --   --  198    Basic Metabolic Panel: Recent Labs  Lab 02/19/24 1241 02/19/24 1249 02/20/24 0330  NA 142 145 142  K 3.4* 3.8 3.7  CL 115* 116* 115*  CO2 19*  --  19*  GLUCOSE 96 88 93  BUN 26* 30* 23  CREATININE 0.90 1.00 1.01*  CALCIUM  8.5*  --  8.4*  MG  --   --  2.1    GFR: Estimated Creatinine Clearance: 27.5 mL/min (A) (by C-G formula based on SCr of 1.01 mg/dL (H)).  Liver Function Tests: Recent Labs  Lab 02/19/24 1241  AST 16  ALT 14  ALKPHOS 54  BILITOT 0.6  PROT 6.0*  ALBUMIN 3.3*    HbA1C: Recent Labs    02/19/24 1741  HGBA1C 5.7*    Lipid Profile: Recent Labs    02/19/24 1241 02/20/24 0330  CHOL 139 128  HDL 60 50  LDLCALC 55 47  TRIG 118 156*  CHOLHDL 2.3 2.6    Thyroid  Function Tests: Recent Labs    02/20/24 0330  TSH 6.733*     Radiology Studies: MR ANGIO HEAD WO CONTRAST Result Date: 02/20/2024 CLINICAL DATA:  Stroke workup EXAM: MRA NECK WITHOUT AND WITH CONTRAST MRA HEAD WITHOUT AND WITH CONTRAST TECHNIQUE: Multiplanar and multiecho pulse sequences of the neck were obtained without and with intravenous contrast. Angiographic images of the neck were obtained using MRA technique without and with intravenous contast.; Angiographic images of the Circle of Willis were obtained using MRA technique without and with intravenous contrast. CONTRAST:  6mL GADAVIST  GADOBUTROL  1 MMOL/ML IV SOLN COMPARISON:  05/05/2022 FINDINGS: MRA NECK FINDINGS Antegrade flow in the carotid and vertebral arteries. The arch is not covered. The covered great vessels are patent with mild-to-moderate tortuosity. Low bifurcation of the common carotid in the  bilateral neck. No common or internal carotid stenosis. The vertebral arteries are tortuous but smoothly contoured and widely patent. MRA HEAD FINDINGS Anterior circulation: No branch occlusion, beading, aneurysm, or proximal flow reducing stenosis. Posterior circulation: The vertebral and basilar arteries are tortuous. Atheromatous irregularity of the basilar with up to mild stenosis. Fetal type bilateral PCA with atheromatous irregularity, PCA stenoses on reformats likely related to imaging at edge of the study with signal loss. No proximal flow reducing stenosis. No aneurysm or vascular malformation. IMPRESSION: No emergent finding. No flow reducing stenosis or embolic source to explain the patient's infarct. Arch and great vessel ostia not covered. Electronically Signed   By: Ronnette Coke M.D.   On: 02/20/2024 07:44   MR ANGIO NECK W WO CONTRAST Result Date: 02/20/2024 CLINICAL DATA:  Stroke  workup EXAM: MRA NECK WITHOUT AND WITH CONTRAST MRA HEAD WITHOUT AND WITH CONTRAST TECHNIQUE: Multiplanar and multiecho pulse sequences of the neck were obtained without and with intravenous contrast. Angiographic images of the neck were obtained using MRA technique without and with intravenous contast.; Angiographic images of the Circle of Willis were obtained using MRA technique without and with intravenous contrast. CONTRAST:  6mL GADAVIST  GADOBUTROL  1 MMOL/ML IV SOLN COMPARISON:  05/05/2022 FINDINGS: MRA NECK FINDINGS Antegrade flow in the carotid and vertebral arteries. The arch is not covered. The covered great vessels are patent with mild-to-moderate tortuosity. Low bifurcation of the common carotid in the bilateral neck. No common or internal carotid stenosis. The vertebral arteries are tortuous but smoothly contoured and widely patent. MRA HEAD FINDINGS Anterior circulation: No branch occlusion, beading, aneurysm, or proximal flow reducing stenosis. Posterior circulation: The vertebral and basilar arteries are  tortuous. Atheromatous irregularity of the basilar with up to mild stenosis. Fetal type bilateral PCA with atheromatous irregularity, PCA stenoses on reformats likely related to imaging at edge of the study with signal loss. No proximal flow reducing stenosis. No aneurysm or vascular malformation. IMPRESSION: No emergent finding. No flow reducing stenosis or embolic source to explain the patient's infarct. Arch and great vessel ostia not covered. Electronically Signed   By: Ronnette Coke M.D.   On: 02/20/2024 07:44   MR BRAIN WO CONTRAST Result Date: 02/19/2024 CLINICAL DATA:  Initial evaluation for acute neuro deficit, stroke suspected. EXAM: MRI HEAD WITHOUT CONTRAST TECHNIQUE: Multiplanar, multiecho pulse sequences of the brain and surrounding structures were obtained without intravenous contrast. COMPARISON:  CT from earlier the same day as well as previous MRI from 05/05/2022. FINDINGS: Brain: Diffuse prominence of the CSF containing spaces compatible with generalized cerebral atrophy. Patchy and confluent T2/FLAIR hyperintensity involving the periventricular deep white matter both cerebral hemispheres, consistent with chronic small vessel ischemic disease, moderately advanced in nature. Few scatter remote lacunar infarcts present about the deep gray nuclei and cerebellum. Encephalomalacia and gliosis involving the posterior right frontoparietal region, consistent with a chronic right MCA distribution infarct. Single punctate 4 mm focus of restricted diffusion noted at the high posterior left frontal lobe, precentral gyrus (series 5, image 40). No associated hemorrhage or mass effect. No other evidence for acute or subacute ischemia. Gray-white matter differentiation otherwise maintained. No acute or significant chronic intracranial blood products. No mass lesion or midline shift. No hydrocephalus or extra-axial fluid collection. Pituitary gland and suprasellar region within normal limits. Vascular: Major  intracranial vascular flow voids are maintained. Skull and upper cervical spine: Craniocervical junction within normal limits. Bone marrow signal intensity normal. No scalp soft tissue abnormality. Sinuses/Orbits: Prior ocular lens replacement on the right. Paranasal sinuses are otherwise clear. No mastoid effusion. Other: None. IMPRESSION: 1. Single punctate 4 mm acute ischemic nonhemorrhagic infarct involving the high posterior left frontal lobe, precentral gyrus. 2. Underlying age-related cerebral atrophy with moderate chronic microvascular ischemic disease, with a chronic right MCA distribution infarct. Electronically Signed   By: Virgia Griffins M.D.   On: 02/19/2024 18:17   CT HEAD WO CONTRAST Result Date: 02/19/2024 CLINICAL DATA:  Neurologic deficit.  Stroke suspected. EXAM: CT HEAD WITHOUT CONTRAST TECHNIQUE: Contiguous axial images were obtained from the base of the skull through the vertex without intravenous contrast. RADIATION DOSE REDUCTION: This exam was performed according to the departmental dose-optimization program which includes automated exposure control, adjustment of the mA and/or kV according to patient size and/or use of iterative reconstruction technique. COMPARISON:  Head CT dated 11/18/2023. FINDINGS: Brain: Moderate age-related atrophy and chronic microvascular ischemic changes. Old right MCA territory infarct. There is no acute intracranial hemorrhage. No mass effect or midline shift. No extra-axial fluid collection. Vascular: No hyperdense vessel or unexpected calcification. Skull: Normal. Negative for fracture or focal lesion. Sinuses/Orbits: No acute finding. Other: None IMPRESSION: 1. No acute intracranial pathology. 2. Moderate age-related atrophy and chronic microvascular ischemic changes. Old right MCA territory infarct. Electronically Signed   By: Angus Bark M.D.   On: 02/19/2024 14:13   DG Chest Portable 1 View Result Date: 02/19/2024 CLINICAL DATA:  Transient  back pain with weakness and dizziness. Generalized weakness for the past 2 days. EXAM: PORTABLE CHEST 1 VIEW COMPARISON:  02/25/2023 FINDINGS: Stable enlarged cardiac silhouette, tortuous and partially calcified thoracic aorta, loop recorder and large hiatal hernia. Mild linear atelectasis/scarring at the left lung base. The remainder of the lungs are clear, hyperexpanded and have normal vascularity. Diffuse osteopenia. IMPRESSION: 1. No acute abnormality. 2. COPD. 3. Stable cardiomegaly. 4. Large hiatal hernia. Electronically Signed   By: Catherin Closs M.D.   On: 02/19/2024 13:44       LOS: 0 days   [place] Weekly Lyndon Santiago  [facility] Pager on [email or web address]  02/20/2024, 9:55 AM

## 2024-02-20 NOTE — Evaluation (Signed)
 Occupational Therapy Evaluation Patient Details Name: Tina Mercado MRN: [id number] DOB: 1929-03-01 Today's Date: 02/20/2024   History of Present Illness   Pt is a 89 y/o female who presents 02/19/2024 with CVA. MRI revealed single punctate 4 mm acute ischemic nonhemorrhagic infarct  involving the high posterior left frontal lobe, precentral gyrus. PMH significant for vertigo/dizziness and imbalance, Arrhythmia, COPD, cataract, glaucoma, heart murmur, HTN, mitral valve regurgitation, osteoporosis, TIA, corneal transplant, IM nail 2020 L.     Clinical Impressions Pt reports ind at baseline with ADLs, uses rollator for mobility. Pt needing up to min A for ADLs, min A for bed mobility and min A for transfers with rollator. Pt reports feeling unsteady and noted 1 LOB episode to the L with ambulation to bathroom. Pt presenting with impairments listed below, will follow acutely. Recommend HHOT at d/c pending progression.     If plan is discharge home, recommend the following:   A little help with walking and/or transfers;A little help with bathing/dressing/bathroom;Assistance with cooking/housework;Assist for transportation;Help with stairs or ramp for entrance     Functional Status Assessment   Patient has had a recent decline in their functional status and demonstrates the ability to make significant improvements in function in a reasonable and predictable amount of time.     Equipment Recommendations   None recommended by OT     Recommendations for Other Services   PT consult     Precautions/Restrictions   Precautions Precautions: Fall Recall of Precautions/Restrictions: Intact Restrictions Weight Bearing Restrictions Per Provider Order: No     Mobility Bed Mobility Overal bed mobility: Needs Assistance Bed Mobility: Supine to Sit     Supine to sit: Min assist          Transfers Overall transfer level: Needs assistance Equipment used: Rollator (4  wheels) Transfers: Sit to/from Stand Sit to Stand: Min assist                  Balance Overall balance assessment: Needs assistance Sitting-balance support: Feet supported Sitting balance-Leahy Scale: Good Sitting balance - Comments: reaches outside BOS without LOB   Standing balance support: During functional activity, Reliant on assistive device for balance Standing balance-Leahy Scale: Fair Standing balance comment: static standing without AD                           ADL either performed or assessed with clinical judgement   ADL Overall ADL's : Needs assistance/impaired Eating/Feeding: Set up;Sitting   Grooming: Set up;Wash/dry hands;Standing   Upper Body Bathing: Sitting;Minimal assistance   Lower Body Bathing: Sitting/lateral leans;Minimal assistance   Upper Body Dressing : Supervision/safety;Sitting   Lower Body Dressing: Set up;Sitting/lateral leans   Toilet Transfer: Minimal assistance;Ambulation;Rollator (4 wheels);Regular Toilet   Toileting- Clothing Manipulation and Hygiene: Minimal assistance       Functional mobility during ADLs: Minimal assistance;Rollator (4 wheels)       Vision   Additional Comments: reports mulitple prior eye surgeries, hx of cataract and AMD, WFL for BADL     Perception Perception: Not tested       Praxis Praxis: Not tested       Pertinent Vitals/Pain Pain Assessment Pain Assessment: No/denies pain     Extremity/Trunk Assessment Upper Extremity Assessment Upper Extremity Assessment: Generalized weakness   Lower Extremity Assessment Lower Extremity Assessment: Defer to PT evaluation   Cervical / Trunk Assessment Cervical / Trunk Assessment: Kyphotic   Communication Communication Communication: Impaired Factors Affecting  Communication: Hearing impaired   Cognition Arousal: Alert Behavior During Therapy: WFL for tasks assessed/performed Cognition: No apparent impairments                                Following commands: Intact       Cueing  General Comments   Cueing Techniques: Verbal cues;Gestural cues  VSS   Exercises     Shoulder Instructions      Home Living Family/patient expects to be discharged to::  ([facility])                                 Additional Comments: Studio style apartment with Engineer, structural. Has a shower seat.      Prior Functioning/Environment Prior Level of Function : Independent/Modified Independent             Mobility Comments: Rollator ADLs Comments: Has not showered recently due to feeling imbalanced; ind with ADLs, goes to cafeteria for meals    OT Problem List: Decreased strength;Decreased range of motion;Decreased activity tolerance;Impaired balance (sitting and/or standing);Decreased coordination   OT Treatment/Interventions: Self-care/ADL training;Therapeutic exercise;Energy conservation;DME and/or AE instruction;Therapeutic activities;Patient/family education;Balance training      OT Goals(Current goals can be found in the care plan section)   Acute Rehab OT Goals Patient Stated Goal: improve balance OT Goal Formulation: With patient Time For Goal Achievement: 03/05/24 Potential to Achieve Goals: Good ADL Goals Pt Will Perform Upper Body Dressing: Independently;sitting Pt Will Perform Lower Body Dressing: Independently;sitting/lateral leans;sit to/from stand Pt Will Transfer to Toilet: Independently;ambulating;regular height toilet Pt Will Perform Tub/Shower Transfer: Shower transfer;ambulating;shower seat Additional ADL Goal #1: pt will demonstrate good standing balance in prep for OOB activity   OT Frequency:  Min 2X/week    Co-evaluation              AM-PAC OT "6 Clicks" Daily Activity     Outcome Measure Help from another person eating meals?: None Help from another person taking care of personal grooming?: A Little Help from another person toileting, which includes  using toliet, bedpan, or urinal?: A Little Help from another person bathing (including washing, rinsing, drying)?: A Little Help from another person to put on and taking off regular upper body clothing?: A Little Help from another person to put on and taking off regular lower body clothing?: A Little 6 Click Score: 19   End of Session Equipment Utilized During Treatment: Gait belt;Rollator (4 wheels) Nurse Communication: Mobility status  Activity Tolerance: Patient tolerated treatment well Patient left: Other (comment) (handoff to PT in room)  OT Visit Diagnosis: Unsteadiness on feet (R26.81);Other abnormalities of gait and mobility (R26.89);Muscle weakness (generalized) (M62.81)                Time: 1610-9604 OT Time Calculation (min): 14 min Charges:  OT General Charges $OT Visit: 1 Visit OT Evaluation $OT Eval Low Complexity: 1 Low  Cristal Howatt K, OTD, OTR/L SecureChat Preferred Acute Rehab [phone]   Kartik Fernando K Koonce 02/20/2024, 10:22 AM

## 2024-02-20 NOTE — ED Notes (Signed)
[facility] called 

## 2024-02-20 NOTE — ED Notes (Signed)
 Report called to Arlin Benes RN 3w

## 2024-02-20 NOTE — Progress Notes (Addendum)
 STROKE TEAM PROGRESS NOTE    SIGNIFICANT HOSPITAL EVENTS Neurology consulted for concern for potential BPPV.  MRI shows incidental left punctate frontal stroke  INTERIM HISTORY/SUBJECTIVE  On exam, patient sitting up in bed.  No family at bedside.  Neurological exam stable with no focal deficits seen.   Recommend Loop Recorder interrogation.   Recommend patient continues aspirin  and Plavix .  Recommend PCP follow-up for improved blood pressure control, antihypertensive medication management for further secondary stroke prevention.   OBJECTIVE  CBC    Component Value Date/Time   WBC 8.3 02/20/2024 0330   RBC 4.01 02/20/2024 0330   HGB 10.8 (L) 02/20/2024 0330   HGB 13.7 10/15/2018 1552   HCT 34.8 (L) 02/20/2024 0330   HCT 41.3 10/15/2018 1552   PLT 198 02/20/2024 0330   PLT 260 10/15/2018 1552   MCV 86.8 02/20/2024 0330   MCV 90 10/15/2018 1552   MCH 26.9 02/20/2024 0330   MCHC 31.0 02/20/2024 0330   RDW 16.7 (H) 02/20/2024 0330   RDW 14.4 10/15/2018 1552   LYMPHSABS 1.3 02/19/2024 1241   LYMPHSABS 1.2 10/15/2018 1552   MONOABS 0.8 02/19/2024 1241   EOSABS 0.0 02/19/2024 1241   EOSABS 0.2 10/15/2018 1552   BASOSABS 0.0 02/19/2024 1241   BASOSABS 0.1 10/15/2018 1552    BMET    Component Value Date/Time   NA 142 02/20/2024 0330   NA 140 06/02/2020 0000   K 3.7 02/20/2024 0330   CL 115 (H) 02/20/2024 0330   CO2 19 (L) 02/20/2024 0330   GLUCOSE 93 02/20/2024 0330   BUN 23 02/20/2024 0330   BUN 13 06/02/2020 0000   CREATININE 1.01 (H) 02/20/2024 0330   CREATININE 1.05 (H) 11/11/2023 1530   CALCIUM  8.4 (L) 02/20/2024 0330   CALCIUM  8.7 03/06/2019 0224   EGFR 50 (L) 11/11/2023 1530   GFRNONAA 52 (L) 02/20/2024 0330    IMAGING past 24 hours MR ANGIO HEAD WO CONTRAST Result Date: 02/20/2024 CLINICAL DATA:  Stroke workup EXAM: MRA NECK WITHOUT AND WITH CONTRAST MRA HEAD WITHOUT AND WITH CONTRAST TECHNIQUE: Multiplanar and multiecho pulse sequences of the neck were  obtained without and with intravenous contrast. Angiographic images of the neck were obtained using MRA technique without and with intravenous contast.; Angiographic images of the Circle of [place] were obtained using MRA technique without and with intravenous contrast. CONTRAST:  6mL GADAVIST  GADOBUTROL  1 MMOL/ML IV SOLN COMPARISON:  05/05/2022 FINDINGS: MRA NECK FINDINGS Antegrade flow in the carotid and vertebral arteries. The arch is not covered. The covered great vessels are patent with mild-to-moderate tortuosity. Low bifurcation of the common carotid in the bilateral neck. No common or internal carotid stenosis. The vertebral arteries are tortuous but smoothly contoured and widely patent. MRA HEAD FINDINGS Anterior circulation: No branch occlusion, beading, aneurysm, or proximal flow reducing stenosis. Posterior circulation: The vertebral and basilar arteries are tortuous. Atheromatous irregularity of the basilar with up to mild stenosis. Fetal type bilateral PCA with atheromatous irregularity, PCA stenoses on reformats likely related to imaging at edge of the study with signal loss. No proximal flow reducing stenosis. No aneurysm or vascular malformation. IMPRESSION: No emergent finding. No flow reducing stenosis or embolic source to explain the patient's infarct. Arch and great vessel ostia not covered. Electronically Signed   By: Ronnette Coke M.D.   On: 02/20/2024 07:44   MR ANGIO NECK W WO CONTRAST Result Date: 02/20/2024 CLINICAL DATA:  Stroke workup EXAM: MRA NECK WITHOUT AND WITH CONTRAST MRA HEAD WITHOUT AND  WITH CONTRAST TECHNIQUE: Multiplanar and multiecho pulse sequences of the neck were obtained without and with intravenous contrast. Angiographic images of the neck were obtained using MRA technique without and with intravenous contast.; Angiographic images of the Circle of Willis were obtained using MRA technique without and with intravenous contrast. CONTRAST:  6mL GADAVIST  GADOBUTROL  1  MMOL/ML IV SOLN COMPARISON:  05/05/2022 FINDINGS: MRA NECK FINDINGS Antegrade flow in the carotid and vertebral arteries. The arch is not covered. The covered great vessels are patent with mild-to-moderate tortuosity. Low bifurcation of the common carotid in the bilateral neck. No common or internal carotid stenosis. The vertebral arteries are tortuous but smoothly contoured and widely patent. MRA HEAD FINDINGS Anterior circulation: No branch occlusion, beading, aneurysm, or proximal flow reducing stenosis. Posterior circulation: The vertebral and basilar arteries are tortuous. Atheromatous irregularity of the basilar with up to mild stenosis. Fetal type bilateral PCA with atheromatous irregularity, PCA stenoses on reformats likely related to imaging at edge of the study with signal loss. No proximal flow reducing stenosis. No aneurysm or vascular malformation. IMPRESSION: No emergent finding. No flow reducing stenosis or embolic source to explain the patient's infarct. Arch and great vessel ostia not covered. Electronically Signed   By: Ronnette Coke M.D.   On: 02/20/2024 07:44   MR BRAIN WO CONTRAST Result Date: 02/19/2024 CLINICAL DATA:  Initial evaluation for acute neuro deficit, stroke suspected. EXAM: MRI HEAD WITHOUT CONTRAST TECHNIQUE: Multiplanar, multiecho pulse sequences of the brain and surrounding structures were obtained without intravenous contrast. COMPARISON:  CT from earlier the same day as well as previous MRI from 05/05/2022. FINDINGS: Brain: Diffuse prominence of the CSF containing spaces compatible with generalized cerebral atrophy. Patchy and confluent T2/FLAIR hyperintensity involving the periventricular deep white matter both cerebral hemispheres, consistent with chronic small vessel ischemic disease, moderately advanced in nature. Few scatter remote lacunar infarcts present about the deep gray nuclei and cerebellum. Encephalomalacia and gliosis involving the posterior right  frontoparietal region, consistent with a chronic right MCA distribution infarct. Single punctate 4 mm focus of restricted diffusion noted at the high posterior left frontal lobe, precentral gyrus (series 5, image 40). No associated hemorrhage or mass effect. No other evidence for acute or subacute ischemia. Gray-white matter differentiation otherwise maintained. No acute or significant chronic intracranial blood products. No mass lesion or midline shift. No hydrocephalus or extra-axial fluid collection. Pituitary gland and suprasellar region within normal limits. Vascular: Major intracranial vascular flow voids are maintained. Skull and upper cervical spine: Craniocervical junction within normal limits. Bone marrow signal intensity normal. No scalp soft tissue abnormality. Sinuses/Orbits: Prior ocular lens replacement on the right. Paranasal sinuses are otherwise clear. No mastoid effusion. Other: None. IMPRESSION: 1. Single punctate 4 mm acute ischemic nonhemorrhagic infarct involving the high posterior left frontal lobe, precentral gyrus. 2. Underlying age-related cerebral atrophy with moderate chronic microvascular ischemic disease, with a chronic right MCA distribution infarct. Electronically Signed   By: Virgia Griffins M.D.   On: 02/19/2024 18:17    Vitals:   02/20/24 0426 02/20/24 0453 02/20/24 0750 02/20/24 1233  BP: (!) 193/167 (!) 202/68 (!) 205/65 110/74  Pulse: 61 (!) 58 (!) 58 68  Resp: 17 18 18 18   Temp: 98 F (36.7 C) 97.9 F (36.6 C) 97.8 F (36.6 C) 97.6 F (36.4 C)  TempSrc:  Oral Oral Oral  SpO2: 94% 98% 97% 95%  Weight:      Height:         PHYSICAL EXAM General:  Alert,  well-nourished, well-developed patient in no acute distress Psych:  Mood and affect appropriate for situation CV: Regular rate and rhythm on monitor Respiratory:  Regular, unlabored respirations on room air GI: Abdomen soft and nontender   NEURO:  Mental Status: AA&Ox3 Speech/Language: speech is  without dysarthria or aphasia.  Naming, repetition, fluency, and comprehension intact.  Cranial Nerves:  II: PERRL. Visual fields full.  III, IV, VI: EOMI. Eyelids elevate symmetrically.  V: Sensation is intact to light touch and symmetrical to face.  VII: Face is symmetrical resting and smiling VIII: hearing intact to voice. IX, X: Palate elevates symmetrically. Phonation is normal.  HQ:IONGEXBM shrug 5/5. XII: midline tongue protrusion Motor: Generalized weakness with 4+/5 overall strength. No focal deficits or drifts seen.  Tone: is normal and bulk is normal Sensation- Intact to light touch bilaterally.   Coordination: FTN intact but slow bilaterally. Gait- deferred  Most Recent NIH: 0    ASSESSMENT/PLAN  GERRIANN EA is a 89 y.o. female with hx of COPD, GERD, HTN, HLD, prior strokes/TIA, ACD, hearing impairment who presented with back pain, weakness and feeling wobbly. Reports has vertigo for the last few years, with the weakness, she feels more wobbly. She was found to have an incidental punctate high posterior left frontal lobe stroke.  NIH on Admission: 0.  Hypertensive encephalopathy  Chronic gait disorder Home meds:  none BP high on admission and during hospitalization Presented with back pain and imbalance Losartan  100->50 mg added Long term BP goal normotensive  Recommend PCP follow-up with better BP control, antihypertensive medication management for secondary stroke prevention.    Incidental stroke:  left frontal lobe precentral gyrus punctate infarct, etiology:  likely small vessel disease vs. Cardioembolic source CT head: No acute abnormality. Small vessel disease. Atrophy.  Old right MCA territory infarct MRI  Single punctate 4 mm acute ischemic infarct high posterior left frontal lobe. Chronic right MCA distribution infarct MRA Head and Neck No flow reducing stenosis or embolic source to explain infarct. 2D Echo: PENDING Pending Loop recorder  interrogation No arrythmias seen from 2023 to end of March 2025 LDL 47 HgbA1c 5.7 UDS neg VTE prophylaxis - lovenox  aspirin  81 mg daily and clopidogrel  75 mg daily prior to admission, continue aspirin  81 mg daily and clopidogrel  75 mg daily  Therapy recommendations:  Home Health PT and Home Health OT.   Disposition:  back to facility  Hx of Stroke/TIA Chronic right MCA infarct seen on MRI 12/2021 admitted for aphasia, vound to have right CR infarct and old right parietal infarct. MRA negative with left fetal PCA. CUS neg. EF 60-65%. No DVT. LDL 238 and A1C 5.4. loop placed and discharged with DAPT and crestor  40.   Hyperlipidemia Home meds: Crestor  40 mg, resumed in hospital LDL 47, goal < 70 Not tolerating lipitor in the past Now on crestor  20 Continue statin at discharge  Other Stroke Risk Factors Advanced Age  Other Active Problems Chronic subjective dizziness with gait disorder Reported during PT and OT sessions, not noted while in bed Meclizine  given x1. Due to age, recommend avoiding further administration if able.  B12 pending Continue follow up with Dr. Omar Bibber at [facility] Acute Urinary Retention Negative UA Hematuria noted on attending note this am could be from in and out intervention, not seen today by RN. No RBCs in UA. No need to stop DAPT at this time.  COPD Glaucoma  Hospital day # 0   Pt seen by Neuro NP/APP and later by MD. Note/plan  to be edited by MD as needed.    Audrene Lease, DNP, AGACNP-BC [facility] Please use AMION for contact information & EPIC for messaging.  ATTENDING NOTE: I reviewed above note and agree with the assessment and plan. Pt was seen and examined.   No family at the bedside. Pt reclining in chair, AAO x 3, mild hearing loss, neuro intact, no focal deficit. Pt stated that she has chronic gait disability, followed with Dr. Omar Bibber at [facility], currently walk with walker. Currently BP high, put on losartan . Stroke on MRI looks like  incidental finding. Continue DAPT and crestor . Will follow  For detailed assessment and plan, please refer to above as I have made changes wherever appropriate.   Consuelo Denmark, MD PhD Stroke Neurology 02/20/2024 10:13 PM   To contact Stroke Continuity provider, please refer to [email or web address]. After hours, contact General Neurology
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
## 2024-02-20 NOTE — Care Management Obs Status (Signed)
 MEDICARE OBSERVATION STATUS NOTIFICATION   Patient Details  Name: Tina Mercado MRN: [id number] Date of Birth: 10-28-1928   Medicare Observation Status Notification Given:  Yes    Cindia Crease, RN 02/20/2024, 4:00 PM

## 2024-02-21 ENCOUNTER — Observation Stay (HOSPITAL_BASED_OUTPATIENT_CLINIC_OR_DEPARTMENT_OTHER)

## 2024-02-21 ENCOUNTER — Encounter (HOSPITAL_COMMUNITY): Payer: Self-pay | Admitting: Internal Medicine

## 2024-02-21 DIAGNOSIS — I10 Essential (primary) hypertension: Secondary | ICD-10-CM | POA: Diagnosis not present

## 2024-02-21 DIAGNOSIS — E538 Deficiency of other specified B group vitamins: Secondary | ICD-10-CM | POA: Diagnosis not present

## 2024-02-21 DIAGNOSIS — D649 Anemia, unspecified: Secondary | ICD-10-CM | POA: Diagnosis not present

## 2024-02-21 DIAGNOSIS — D519 Vitamin B12 deficiency anemia, unspecified: Secondary | ICD-10-CM

## 2024-02-21 DIAGNOSIS — I639 Cerebral infarction, unspecified: Secondary | ICD-10-CM | POA: Diagnosis not present

## 2024-02-21 DIAGNOSIS — I6389 Other cerebral infarction: Secondary | ICD-10-CM | POA: Diagnosis not present

## 2024-02-21 LAB — BASIC METABOLIC PANEL WITH GFR
Anion gap: 9 (ref 5–15)
BUN: 25 mg/dL — ABNORMAL HIGH (ref 8–23)
CO2: 19 mmol/L — ABNORMAL LOW (ref 22–32)
Calcium: 8.8 mg/dL — ABNORMAL LOW (ref 8.9–10.3)
Chloride: 114 mmol/L — ABNORMAL HIGH (ref 98–111)
Creatinine, Ser: 1.22 mg/dL — ABNORMAL HIGH (ref 0.44–1.00)
GFR, Estimated: 41 mL/min — ABNORMAL LOW (ref >60)
Glucose, Bld: 99 mg/dL (ref 70–99)
Potassium: 3.9 mmol/L (ref 3.5–5.1)
Sodium: 142 mmol/L (ref 135–145)

## 2024-02-21 LAB — IRON AND TIBC
Iron: 27 ug/dL — ABNORMAL LOW (ref 28–170)
Saturation Ratios: 7 % — ABNORMAL LOW (ref 10.4–31.8)
TIBC: 365 ug/dL (ref 250–450)
UIBC: 338 ug/dL

## 2024-02-21 LAB — ECHOCARDIOGRAM COMPLETE
AV Mean grad: 4 mmHg
AV Peak grad: 6.8 mmHg
Ao pk vel: 1.3 m/s
Area-P 1/2: 2.22 cm2
Calc EF: 68.6 %
Height: 62 in
Single Plane A2C EF: 75.5 %
Single Plane A4C EF: 65 %
Weight: 2017.65 [oz_av]

## 2024-02-21 LAB — CBC
HCT: 34.1 % — ABNORMAL LOW (ref 36.0–46.0)
Hemoglobin: 10.8 g/dL — ABNORMAL LOW (ref 12.0–15.0)
MCH: 26.8 pg (ref 26.0–34.0)
MCHC: 31.7 g/dL (ref 30.0–36.0)
MCV: 84.6 fL (ref 80.0–100.0)
Platelets: 203 10*3/uL (ref 150–400)
RBC: 4.03 MIL/uL (ref 3.87–5.11)
RDW: 16.5 % — ABNORMAL HIGH (ref 11.5–15.5)
WBC: 6.4 10*3/uL (ref 4.0–10.5)
nRBC: 0 % (ref 0.0–0.2)

## 2024-02-21 LAB — T4, FREE: Free T4: 1.16 ng/dL — ABNORMAL HIGH (ref 0.61–1.12)

## 2024-02-21 LAB — RETICULOCYTES
Immature Retic Fract: 19.7 % — ABNORMAL HIGH (ref 2.3–15.9)
RBC.: 3.99 MIL/uL (ref 3.87–5.11)
Retic Count, Absolute: 53.9 10*3/uL (ref 19.0–186.0)
Retic Ct Pct: 1.4 % (ref 0.4–3.1)

## 2024-02-21 LAB — VITAMIN B12: Vitamin B-12: 163 pg/mL — ABNORMAL LOW (ref 180–914)

## 2024-02-21 LAB — FOLATE: Folate: 6.9 ng/mL (ref >5.9)

## 2024-02-21 LAB — FERRITIN: Ferritin: 10 ng/mL — ABNORMAL LOW (ref 11–307)

## 2024-02-21 MED ORDER — POLYETHYLENE GLYCOL 3350 17 G PO PACK: 17.0000 g | PACK | Freq: Every day | ORAL | Status: DC | PRN

## 2024-02-21 MED ORDER — SODIUM CHLORIDE 0.45 % IV SOLN: INTRAVENOUS | Status: AC

## 2024-02-21 MED ORDER — SENNOSIDES-DOCUSATE SODIUM 8.6-50 MG PO TABS
2.0000 | ORAL_TABLET | Freq: Two times a day (BID) | ORAL | Status: DC
  Administered 2024-02-21: 2 via ORAL
  Filled 2024-02-21 (×3): qty 2

## 2024-02-21 MED ORDER — VITAMIN B-12 1000 MCG PO TABS: 1000.0000 ug | ORAL_TABLET | Freq: Every day | ORAL | Status: DC

## 2024-02-21 MED ORDER — CYANOCOBALAMIN 1000 MCG/ML IJ SOLN
1000.0000 ug | Freq: Every day | INTRAMUSCULAR | Status: DC
  Administered 2024-02-21 – 2024-02-22 (×2): 1000 ug via INTRAMUSCULAR
  Filled 2024-02-21 (×2): qty 1

## 2024-02-21 MED ORDER — LOSARTAN POTASSIUM 50 MG PO TABS
100.0000 mg | ORAL_TABLET | Freq: Every day | ORAL | Status: DC
  Administered 2024-02-22: 100 mg via ORAL
  Filled 2024-02-21: qty 2

## 2024-02-21 NOTE — Progress Notes (Signed)
  Echocardiogram 2D Echocardiogram has been performed.  Annis Kinder, RDCS 02/21/2024, 9:56 AM

## 2024-02-21 NOTE — Plan of Care (Signed)

## 2024-02-21 NOTE — Progress Notes (Addendum)
 STROKE TEAM PROGRESS NOTE    SIGNIFICANT HOSPITAL EVENTS Neurology consulted for concern for potential BPPV.  MRI shows incidental left punctate frontal stroke  INTERIM HISTORY/SUBJECTIVE  Neuro exam stable. Patient sitting up in bed. No family at bedside.   B12 came back low, supplementation started.   Pending Loop recorder interrogation Monday.    OBJECTIVE  CBC    Component Value Date/Time   WBC 6.4 02/21/2024 0729   RBC 4.03 02/21/2024 0729   RBC 3.99 02/21/2024 0729   HGB 10.8 (L) 02/21/2024 0729   HGB 13.7 10/15/2018 1552   HCT 34.1 (L) 02/21/2024 0729   HCT 41.3 10/15/2018 1552   PLT 203 02/21/2024 0729   PLT 260 10/15/2018 1552   MCV 84.6 02/21/2024 0729   MCV 90 10/15/2018 1552   MCH 26.8 02/21/2024 0729   MCHC 31.7 02/21/2024 0729   RDW 16.5 (H) 02/21/2024 0729   RDW 14.4 10/15/2018 1552   LYMPHSABS 1.3 02/19/2024 1241   LYMPHSABS 1.2 10/15/2018 1552   MONOABS 0.8 02/19/2024 1241   EOSABS 0.0 02/19/2024 1241   EOSABS 0.2 10/15/2018 1552   BASOSABS 0.0 02/19/2024 1241   BASOSABS 0.1 10/15/2018 1552    BMET    Component Value Date/Time   NA 142 02/20/2024 0330   NA 140 06/02/2020 0000   K 3.7 02/20/2024 0330   CL 115 (H) 02/20/2024 0330   CO2 19 (L) 02/20/2024 0330   GLUCOSE 93 02/20/2024 0330   BUN 23 02/20/2024 0330   BUN 13 06/02/2020 0000   CREATININE 1.01 (H) 02/20/2024 0330   CREATININE 1.05 (H) 11/11/2023 1530   CALCIUM  8.4 (L) 02/20/2024 0330   CALCIUM  8.7 03/06/2019 0224   EGFR 50 (L) 11/11/2023 1530   GFRNONAA 52 (L) 02/20/2024 0330    IMAGING past 24 hours CT RENAL STONE STUDY Result Date: 02/20/2024 CLINICAL DATA:  Hematuria and urinary retention1. EXAM: CT ABDOMEN AND PELVIS WITHOUT CONTRAST TECHNIQUE: Multidetector CT imaging of the abdomen and pelvis was performed following the standard protocol without IV contrast. RADIATION DOSE REDUCTION: This exam was performed according to the departmental dose-optimization program which  includes automated exposure control, adjustment of the mA and/or kV according to patient size and/or use of iterative reconstruction technique. COMPARISON:  04/01/2016 FINDINGS: Lower chest: Clear lung bases. Normal heart size without pericardial or pleural effusion. Right coronary artery calcification. Incompletely imaged moderate to large hiatal hernia with approximately 1/2 of the stomach positioned in the lower chest. Hepatobiliary: Normal liver. Multiple small gallstones without acute cholecystitis or biliary duct dilatation. Pancreas: Normal for age.  No duct dilatation or acute inflammation. Spleen: Normal in size, without focal abnormality. Adrenals/Urinary Tract: Normal adrenal glands. No hydronephrosis. Decrease sensitivity for collecting system calculi secondary to contrast within the collecting systems and ureters, presumably from today's MRA. Contrast within the dependent bladder. Stomach/Bowel: Possible gastric antral wall thickening in the setting of underdistention. Scattered colonic diverticula. Colonic stool burden suggests constipation. Normal terminal ileum. Appendix not visualized. Normal small bowel. Vascular/Lymphatic: Advanced aortic and branch vessel atherosclerosis. No abdominopelvic adenopathy. Reproductive: Calcified uterine fundal 1.0 cm fibroid. No adnexal mass. Other: No significant free fluid. No free intraperitoneal air. Gas within the anterior abdominal subcutaneous tissues is likely iatrogenic. Musculoskeletal: Left proximal femur fixation. Left greater than right hip osteoarthritis. Osteopenia. Lumbosacral spondylosis. Remote left inferior pubic ramus fracture. IMPRESSION: 1. No evidence of hydroureteronephrosis or explanation for urinary retention. 2. Presumably MR contrast within renal collecting systems, ureters, and bladder, limiting evaluation for urinary tract  calculi. 3. Large hiatal hernia 4.  Possible constipation. 5. Cholelithiasis 6. Possible gastric antral wall  thickening. Correlate with symptoms of gastritis. 7. Uterine fibroid. 8. Coronary artery atherosclerosis. Aortic Atherosclerosis (ICD10-I70.0). Electronically Signed   By: Lore Rode M.D.   On: 02/20/2024 15:55    Vitals:   02/20/24 2045 02/21/24 0014 02/21/24 0421 02/21/24 0827  BP: (!) 131/53 132/62 (!) 144/70 (!) 168/56  Pulse: 65 69 63 78  Resp: 19  18   Temp: 98.4 F (36.9 C) 97.8 F (36.6 C) 97.9 F (36.6 C) 97.6 F (36.4 C)  TempSrc: Oral Oral Oral Oral  SpO2: 95% 98% 98% 95%  Weight:      Height:         PHYSICAL EXAM General:  Alert, well-nourished, well-developed patient in no acute distress Psych: Calm and cooperative CV: Regular rate and rhythm on monitor Respiratory:  Regular, unlabored respirations on room air   NEURO:  Mental Status: AA&Ox3 Speech/Language: speech is without dysarthria or aphasia.  Naming, repetition, fluency, and comprehension intact.  Cranial Nerves:  II: PERRL. Visual fields full.  III, IV, VI: EOMI. Eyelids elevate symmetrically.  V: Sensation is intact to light touch and symmetrical to face.  VII: Face is symmetrical resting and smiling VIII: hearing intact to voice. IX, X: Palate elevates symmetrically. Phonation is normal.  ZO:[id number] shrug 5/5. XII: midline tongue protrusion Motor: Generalized weakness with 4+/5 overall strength. No focal deficits or drifts seen.  Tone: is normal and bulk is normal Sensation- Intact to light touch bilaterally.   Coordination: FTN intact but slow bilaterally. Gait- deferred  Most Recent NIH: 0    ASSESSMENT/PLAN  Tina Mercado is a 89 y.o. female with hx of COPD, GERD, HTN, HLD, prior strokes/TIA, ACD, hearing impairment who presented with back pain, weakness and feeling wobbly. Reports has vertigo for the last few years, with the weakness, she feels more wobbly. She was found to have an incidental punctate high posterior left frontal lobe stroke.  NIH on Admission: 0.  Hypertensive  encephalopathy  Chronic gait disorder B12 deficiency Home meds:  none BP high on admission and during hospitalization Presented with back pain and imbalance Added Losartan  100->50->100 mg  Long term BP goal normotensive  Recommend PCP follow-up with better BP control, antihypertensive medication management for secondary stroke prevention.   B12 =163 on supplementation. Could definitely be contributing factor to patients reported dizziness.  Continue follow up with Dr. Omar Bibber at [facility]  Incidental stroke:  left frontal lobe precentral gyrus punctate infarct, etiology:  likely small vessel disease vs. Cardioembolic source CT head: No acute abnormality. Small vessel disease. Atrophy.  Old right MCA territory infarct MRI  Single punctate 4 mm acute ischemic infarct high posterior left frontal lobe. Chronic right MCA distribution infarct MRA Head and Neck No flow reducing stenosis or embolic source to explain infarct. 2D Echo: LVEF 60 to 65%  Pending Loop recorder interrogation No arrythmias seen from 2023 to end of March 2025 LDL 47 HgbA1c 5.7 UDS neg VTE prophylaxis - lovenox  aspirin  81 mg daily and clopidogrel  75 mg daily prior to admission, continue aspirin  81 mg daily and clopidogrel  75 mg daily  Therapy recommendations:  Home Health PT and Home Health OT.   Disposition:  back to facility  Hx of Stroke/TIA Chronic right MCA infarct seen on MRI 12/2021 admitted for aphasia, vound to have right CR infarct and old right parietal infarct. MRA negative with left fetal PCA. CUS neg. EF 60-65%. No  DVT. LDL 238 and A1C 5.4. loop placed and discharged with DAPT and crestor  40.   Hyperlipidemia Home meds: Crestor  40 mg, resumed in hospital LDL 47, goal < 70 Not tolerating lipitor in the past Now on crestor  20 Continue statin at discharge  Other Stroke Risk Factors Advanced Age  Other Active Problems Acute Urinary Retention Negative UA Hematuria noted on attending note 4/26 could be from  in and out intervention, not seen today by RN. No RBCs in UA. No need to stop DAPT at this time.  COPD Glaucoma Macrocytic anemia - low iron and ferritin level, recommend supplement  Hospital day # 0   Pt seen by Neuro NP/APP and later by MD. Note/plan to be edited by MD as needed.    Audrene Lease, DNP, AGACNP-BC [facility] Please use AMION for contact information & EPIC for messaging.  ATTENDING NOTE: I reviewed above note and agree with the assessment and plan. Pt was seen and examined.   Patient sitting in chair, no family at bedside.  No complaints and no acute event overnight.  2D echo done unremarkable.  B12 only 163 with B12 deficiency which can explaining her chronic dizziness and gait difficulty.  Put on B12 supplement.  Continue follow-up with Dr. Omar Bibber at [facility].  Still pending loop recorder interrogation.  Continue DAPT and statin.  Will follow  For detailed assessment and plan, please refer to above as I have made changes wherever appropriate.   Tina Denmark, MD PhD Stroke Neurology 02/21/2024 1:56 PM

## 2024-02-21 NOTE — Progress Notes (Addendum)
 Referral received to arrange [facility] PT/OT. Met with pt at bedside. She plans to return to [facility]. She agrees with [facility] therapy. Contacted Jeannett Million at [employer] for [facility] referral. Medical records faxed to [place]. Fax successfully sent.

## 2024-02-21 NOTE — Progress Notes (Signed)
 [facility] PROGRESS NOTE   WILLIAM TORSIELLO ZOX:[id number] DOB: [birth year] DOA: 02/19/2024  PCP: Tye Gall, MD  Brief History: 89 y.o. female with medical history significant of COPD, hiatal hernia and GERD, hyperlipidemia, hypertension, stroke/TIA, anemia of chronic disease, CKD stage IIIa, hearing impairment presented to the ED with complaints of difficulty walking/trouble with balance and coordination and transient back pain but no falls reported.  Patient found to have acute stroke.  Patient was hospitalized for further management.   Consultants: Neurology  Procedures: Echocardiogram is pending    Subjective/Interval History: Patient denies any new complaints this morning.  Continues to have some balance issues when she worked with physical therapy yesterday.  No other complaints offered at this time.  No urinary complaints this morning.    Assessment/Plan:  Acute ischemic stroke Patient was on aspirin  and Plavix  prior to admission. MRI brain has been completed. Patient also underwent MRA head and neck. LDL is noted to be 47.  Patient is already on statin. HbA1c is 5.7. TSH noted to be 6.7.  Free T4 is noted to be 1.16.  Recommend rechecking thyroid  function test in a few weeks. Echocardiogram is pending.   Seen by PT and OT.  Home health is recommended.   Seen by stroke service.  They recommend continuing aspirin  and Plavix . Loop recorder to be interrogated prior to discharge. B12 level is noted to be low which could be the reason for her imbalance.  See below.  Acute urinary retention Required In-N-Out catheterization in the emergency department.  UA does not suggest infection.  Small amount of blood is noted. Patient with urinary symptoms of frequent urination over the past few weeks.  The reason for this is not entirely clear.  Now with new urinary retention and presence of blood in the urine she may warrant further workup.  CT renal stone study did  not show any significant findings.  Could not rule out stone due to presence of MR contrast. Bladder scans without any significant retention.  Continue to mobilize.  Hold off on Flomax for now.  Cholelithiasis Incidentally noted on CT scan.  Asymptomatic.  LFTs were normal.  Large hiatal hernia/findings of gastritis Noted to be on famotidine  prior to admission which can be continued at discharge. Currently asymptomatic.  Constipation Initiate bowel regimen.  Essential hypertension Allowing permissive hypertension.  Sinus bradycardia with first-degree AV block noted.  Normocytic anemia/iron deficiency Hemoglobin is stable.  Anemia panel shows ferritin of 10, iron of 27, TIBC 365, percent saturation 7.  Folic acid 6.9.  B12 163. Iron supplements to be prescribed at discharge.  Further management of iron deficiency per PCP.  Vitamin B12 deficiency Start aggressive supplementation.  Be responsible for her imbalance.  Chronic metabolic acidosis Stable.  Etiology unclear.  Chronic kidney disease stage IIIa Creatinine noted to be slightly worse today compared to yesterday.  Still reasonably close to her baseline.  Will recheck labs in the morning.  History of COPD Stable.  Hypokalemia Supplemented.   DVT Prophylaxis: Lovenox  Code Status: DNR Family Communication: Discussed with patient Disposition Plan: She lives in an independent living facility     Medications: Scheduled:  aspirin  EC  81 mg Oral Daily   clopidogrel   75 mg Oral Daily   enoxaparin  (LOVENOX ) injection  30 mg Subcutaneous QHS   losartan   50 mg Oral Daily   rosuvastatin   20 mg Oral Daily   Continuous: PRN:acetaminophen , hydrALAZINE    Objective:  Vital Signs  Vitals:   02/20/24  2045 02/21/24 0014 02/21/24 0421 02/21/24 0827  BP: (!) 131/53 132/62 (!) 144/70 (!) 168/56  Pulse: 65 69 63 78  Resp: 19  18   Temp: 98.4 F (36.9 C) 97.8 F (36.6 C) 97.9 F (36.6 C) 97.6 F (36.4 C)  TempSrc: Oral  Oral Oral Oral  SpO2: 95% 98% 98% 95%  Weight:      Height:       No intake or output data in the 24 hours ending 02/21/24 0920  Filed Weights   02/19/24 1206  Weight: 57.2 kg    General appearance: Awake alert.  In no distress Resp: Clear to auscultation bilaterally.  Normal effort Cardio: S1-S2 is normal regular.  No S3-S4.  No rubs murmurs or bruit GI: Abdomen is soft.  Nontender nondistended.  Bowel sounds are present normal.  No masses organomegaly    Lab Results:  Data Reviewed: I have personally reviewed following labs and reports of the imaging studies  CBC: Recent Labs  Lab 02/19/24 1241 02/19/24 1249 02/19/24 1741 02/20/24 0330 02/21/24 0729  WBC 7.4  --   --  8.3 6.4  NEUTROABS 5.3  --   --   --   --   HGB 10.3* 10.9* 10.9* 10.8* 10.8*  HCT 33.5* 32.0* 35.8* 34.8* 34.1*  MCV 86.8  --   --  86.8 84.6  PLT 192  --   --  198 203    Basic Metabolic Panel: Recent Labs  Lab 02/19/24 1241 02/19/24 1249 02/20/24 0330 02/21/24 0729  NA 142 145 142 142  K 3.4* 3.8 3.7 3.9  CL 115* 116* 115* 114*  CO2 19*  --  19* 19*  GLUCOSE 96 88 93 99  BUN 26* 30* 23 25*  CREATININE 0.90 1.00 1.01* 1.22*  CALCIUM  8.5*  --  8.4* 8.8*  MG  --   --  2.1  --     GFR: Estimated Creatinine Clearance: 22.8 mL/min (A) (by C-G formula based on SCr of 1.22 mg/dL (H)).  Liver Function Tests: Recent Labs  Lab 02/19/24 1241  AST 16  ALT 14  ALKPHOS 54  BILITOT 0.6  PROT 6.0*  ALBUMIN 3.3*    HbA1C: Recent Labs    02/19/24 1741  HGBA1C 5.7*    Lipid Profile: Recent Labs    02/19/24 1241 02/20/24 0330  CHOL 139 128  HDL 60 50  LDLCALC 55 47  TRIG 118 156*  CHOLHDL 2.3 2.6    Thyroid  Function Tests: Recent Labs    02/20/24 0330 02/21/24 0729  TSH 6.733*  --   FREET4  --  1.16*     Radiology Studies: CT RENAL STONE STUDY Result Date: 02/20/2024 CLINICAL DATA:  Hematuria and urinary retention1. EXAM: CT ABDOMEN AND PELVIS WITHOUT CONTRAST  TECHNIQUE: Multidetector CT imaging of the abdomen and pelvis was performed following the standard protocol without IV contrast. RADIATION DOSE REDUCTION: This exam was performed according to the departmental dose-optimization program which includes automated exposure control, adjustment of the mA and/or kV according to patient size and/or use of iterative reconstruction technique. COMPARISON:  04/01/2016 FINDINGS: Lower chest: Clear lung bases. Normal heart size without pericardial or pleural effusion. Right coronary artery calcification. Incompletely imaged moderate to large hiatal hernia with approximately 1/2 of the stomach positioned in the lower chest. Hepatobiliary: Normal liver. Multiple small gallstones without acute cholecystitis or biliary duct dilatation. Pancreas: Normal for age.  No duct dilatation or acute inflammation. Spleen: Normal in size, without focal abnormality. Adrenals/Urinary  Tract: Normal adrenal glands. No hydronephrosis. Decrease sensitivity for collecting system calculi secondary to contrast within the collecting systems and ureters, presumably from today's MRA. Contrast within the dependent bladder. Stomach/Bowel: Possible gastric antral wall thickening in the setting of underdistention. Scattered colonic diverticula. Colonic stool burden suggests constipation. Normal terminal ileum. Appendix not visualized. Normal small bowel. Vascular/Lymphatic: Advanced aortic and branch vessel atherosclerosis. No abdominopelvic adenopathy. Reproductive: Calcified uterine fundal 1.0 cm fibroid. No adnexal mass. Other: No significant free fluid. No free intraperitoneal air. Gas within the anterior abdominal subcutaneous tissues is likely iatrogenic. Musculoskeletal: Left proximal femur fixation. Left greater than right hip osteoarthritis. Osteopenia. Lumbosacral spondylosis. Remote left inferior pubic ramus fracture. IMPRESSION: 1. No evidence of hydroureteronephrosis or explanation for urinary  retention. 2. Presumably MR contrast within renal collecting systems, ureters, and bladder, limiting evaluation for urinary tract calculi. 3. Large hiatal hernia 4.  Possible constipation. 5. Cholelithiasis 6. Possible gastric antral wall thickening. Correlate with symptoms of gastritis. 7. Uterine fibroid. 8. Coronary artery atherosclerosis. Aortic Atherosclerosis (ICD10-I70.0). Electronically Signed   By: Lore Rode M.D.   On: 02/20/2024 15:55   MR ANGIO HEAD WO CONTRAST Result Date: 02/20/2024 CLINICAL DATA:  Stroke workup EXAM: MRA NECK WITHOUT AND WITH CONTRAST MRA HEAD WITHOUT AND WITH CONTRAST TECHNIQUE: Multiplanar and multiecho pulse sequences of the neck were obtained without and with intravenous contrast. Angiographic images of the neck were obtained using MRA technique without and with intravenous contast.; Angiographic images of the Circle of Willis were obtained using MRA technique without and with intravenous contrast. CONTRAST:  6mL GADAVIST  GADOBUTROL  1 MMOL/ML IV SOLN COMPARISON:  05/05/2022 FINDINGS: MRA NECK FINDINGS Antegrade flow in the carotid and vertebral arteries. The arch is not covered. The covered great vessels are patent with mild-to-moderate tortuosity. Low bifurcation of the common carotid in the bilateral neck. No common or internal carotid stenosis. The vertebral arteries are tortuous but smoothly contoured and widely patent. MRA HEAD FINDINGS Anterior circulation: No branch occlusion, beading, aneurysm, or proximal flow reducing stenosis. Posterior circulation: The vertebral and basilar arteries are tortuous. Atheromatous irregularity of the basilar with up to mild stenosis. Fetal type bilateral PCA with atheromatous irregularity, PCA stenoses on reformats likely related to imaging at edge of the study with signal loss. No proximal flow reducing stenosis. No aneurysm or vascular malformation. IMPRESSION: No emergent finding. No flow reducing stenosis or embolic source to  explain the patient's infarct. Arch and great vessel ostia not covered. Electronically Signed   By: Ronnette Coke M.D.   On: 02/20/2024 07:44   MR ANGIO NECK W WO CONTRAST Result Date: 02/20/2024 CLINICAL DATA:  Stroke workup EXAM: MRA NECK WITHOUT AND WITH CONTRAST MRA HEAD WITHOUT AND WITH CONTRAST TECHNIQUE: Multiplanar and multiecho pulse sequences of the neck were obtained without and with intravenous contrast. Angiographic images of the neck were obtained using MRA technique without and with intravenous contast.; Angiographic images of the Circle of Willis were obtained using MRA technique without and with intravenous contrast. CONTRAST:  6mL GADAVIST  GADOBUTROL  1 MMOL/ML IV SOLN COMPARISON:  05/05/2022 FINDINGS: MRA NECK FINDINGS Antegrade flow in the carotid and vertebral arteries. The arch is not covered. The covered great vessels are patent with mild-to-moderate tortuosity. Low bifurcation of the common carotid in the bilateral neck. No common or internal carotid stenosis. The vertebral arteries are tortuous but smoothly contoured and widely patent. MRA HEAD FINDINGS Anterior circulation: No branch occlusion, beading, aneurysm, or proximal flow reducing stenosis. Posterior circulation: The vertebral and basilar  arteries are tortuous. Atheromatous irregularity of the basilar with up to mild stenosis. Fetal type bilateral PCA with atheromatous irregularity, PCA stenoses on reformats likely related to imaging at edge of the study with signal loss. No proximal flow reducing stenosis. No aneurysm or vascular malformation. IMPRESSION: No emergent finding. No flow reducing stenosis or embolic source to explain the patient's infarct. Arch and great vessel ostia not covered. Electronically Signed   By: Ronnette Coke M.D.   On: 02/20/2024 07:44   MR BRAIN WO CONTRAST Result Date: 02/19/2024 CLINICAL DATA:  Initial evaluation for acute neuro deficit, stroke suspected. EXAM: MRI HEAD WITHOUT CONTRAST  TECHNIQUE: Multiplanar, multiecho pulse sequences of the brain and surrounding structures were obtained without intravenous contrast. COMPARISON:  CT from earlier the same day as well as previous MRI from 05/05/2022. FINDINGS: Brain: Diffuse prominence of the CSF containing spaces compatible with generalized cerebral atrophy. Patchy and confluent T2/FLAIR hyperintensity involving the periventricular deep white matter both cerebral hemispheres, consistent with chronic small vessel ischemic disease, moderately advanced in nature. Few scatter remote lacunar infarcts present about the deep gray nuclei and cerebellum. Encephalomalacia and gliosis involving the posterior right frontoparietal region, consistent with a chronic right MCA distribution infarct. Single punctate 4 mm focus of restricted diffusion noted at the high posterior left frontal lobe, precentral gyrus (series 5, image 40). No associated hemorrhage or mass effect. No other evidence for acute or subacute ischemia. Gray-white matter differentiation otherwise maintained. No acute or significant chronic intracranial blood products. No mass lesion or midline shift. No hydrocephalus or extra-axial fluid collection. Pituitary gland and suprasellar region within normal limits. Vascular: Major intracranial vascular flow voids are maintained. Skull and upper cervical spine: Craniocervical junction within normal limits. Bone marrow signal intensity normal. No scalp soft tissue abnormality. Sinuses/Orbits: Prior ocular lens replacement on the right. Paranasal sinuses are otherwise clear. No mastoid effusion. Other: None. IMPRESSION: 1. Single punctate 4 mm acute ischemic nonhemorrhagic infarct involving the high posterior left frontal lobe, precentral gyrus. 2. Underlying age-related cerebral atrophy with moderate chronic microvascular ischemic disease, with a chronic right MCA distribution infarct. Electronically Signed   By: Virgia Griffins M.D.   On:  02/19/2024 18:17   CT HEAD WO CONTRAST Result Date: 02/19/2024 CLINICAL DATA:  Neurologic deficit.  Stroke suspected. EXAM: CT HEAD WITHOUT CONTRAST TECHNIQUE: Contiguous axial images were obtained from the base of the skull through the vertex without intravenous contrast. RADIATION DOSE REDUCTION: This exam was performed according to the departmental dose-optimization program which includes automated exposure control, adjustment of the mA and/or kV according to patient size and/or use of iterative reconstruction technique. COMPARISON:  Head CT dated 11/18/2023. FINDINGS: Brain: Moderate age-related atrophy and chronic microvascular ischemic changes. Old right MCA territory infarct. There is no acute intracranial hemorrhage. No mass effect or midline shift. No extra-axial fluid collection. Vascular: No hyperdense vessel or unexpected calcification. Skull: Normal. Negative for fracture or focal lesion. Sinuses/Orbits: No acute finding. Other: None IMPRESSION: 1. No acute intracranial pathology. 2. Moderate age-related atrophy and chronic microvascular ischemic changes. Old right MCA territory infarct. Electronically Signed   By: Angus Bark M.D.   On: 02/19/2024 14:13   DG Chest Portable 1 View Result Date: 02/19/2024 CLINICAL DATA:  Transient back pain with weakness and dizziness. Generalized weakness for the past 2 days. EXAM: PORTABLE CHEST 1 VIEW COMPARISON:  02/25/2023 FINDINGS: Stable enlarged cardiac silhouette, tortuous and partially calcified thoracic aorta, loop recorder and large hiatal hernia. Mild linear atelectasis/scarring at the left  lung base. The remainder of the lungs are clear, hyperexpanded and have normal vascularity. Diffuse osteopenia. IMPRESSION: 1. No acute abnormality. 2. COPD. 3. Stable cardiomegaly. 4. Large hiatal hernia. Electronically Signed   By: Catherin Closs M.D.   On: 02/19/2024 13:44       LOS: 0 days   Gusta Marksberry Lyndon Santiago  [facility] Pager on  [email or web address]  02/21/2024, 9:20 AM

## 2024-02-22 ENCOUNTER — Other Ambulatory Visit: Payer: Self-pay | Admitting: Nurse Practitioner

## 2024-02-22 ENCOUNTER — Ambulatory Visit (INDEPENDENT_AMBULATORY_CARE_PROVIDER_SITE_OTHER): Payer: Medicare Other

## 2024-02-22 DIAGNOSIS — I6389 Other cerebral infarction: Secondary | ICD-10-CM | POA: Diagnosis not present

## 2024-02-22 DIAGNOSIS — I639 Cerebral infarction, unspecified: Secondary | ICD-10-CM | POA: Diagnosis not present

## 2024-02-22 LAB — BASIC METABOLIC PANEL WITH GFR
Anion gap: 9 (ref 5–15)
BUN: 24 mg/dL — ABNORMAL HIGH (ref 8–23)
CO2: 20 mmol/L — ABNORMAL LOW (ref 22–32)
Calcium: 8.7 mg/dL — ABNORMAL LOW (ref 8.9–10.3)
Chloride: 111 mmol/L (ref 98–111)
Creatinine, Ser: 1.17 mg/dL — ABNORMAL HIGH (ref 0.44–1.00)
GFR, Estimated: 44 mL/min — ABNORMAL LOW (ref >60)
Glucose, Bld: 100 mg/dL — ABNORMAL HIGH (ref 70–99)
Potassium: 4.4 mmol/L (ref 3.5–5.1)
Sodium: 140 mmol/L (ref 135–145)

## 2024-02-22 LAB — GLUCOSE, CAPILLARY: Glucose-Capillary: 99 mg/dL (ref 70–99)

## 2024-02-22 MED ORDER — FERROUS SULFATE 325 (65 FE) MG PO TBEC: 325.0000 mg | DELAYED_RELEASE_TABLET | Freq: Every day | ORAL | 3 refills | Status: AC

## 2024-02-22 MED ORDER — SENNOSIDES-DOCUSATE SODIUM 8.6-50 MG PO TABS: 2.0000 | ORAL_TABLET | Freq: Every day | ORAL | 0 refills | Status: DC

## 2024-02-22 MED ORDER — ROSUVASTATIN CALCIUM 20 MG PO TABS: 20.0000 mg | ORAL_TABLET | Freq: Every day | ORAL | 2 refills | Status: AC

## 2024-02-22 MED ORDER — CYANOCOBALAMIN 1000 MCG PO TABS: 1000.0000 ug | ORAL_TABLET | Freq: Every day | ORAL | 2 refills | Status: AC

## 2024-02-22 MED ORDER — LOSARTAN POTASSIUM 100 MG PO TABS: 100.0000 mg | ORAL_TABLET | Freq: Every day | ORAL | 1 refills | Status: DC

## 2024-02-22 NOTE — Progress Notes (Signed)
 STROKE TEAM PROGRESS NOTE    SIGNIFICANT HOSPITAL EVENTS Neurology consulted for concern for potential BPPV.  MRI shows incidental left punctate frontal stroke  INTERIM HISTORY/SUBJECTIVE No acute event overnight, no complains. Loop recorder interrogation no afib.    OBJECTIVE  CBC    Component Value Date/Time   WBC 6.4 02/21/2024 0729   RBC 4.03 02/21/2024 0729   RBC 3.99 02/21/2024 0729   HGB 10.8 (L) 02/21/2024 0729   HGB 13.7 10/15/2018 1552   HCT 34.1 (L) 02/21/2024 0729   HCT 41.3 10/15/2018 1552   PLT 203 02/21/2024 0729   PLT 260 10/15/2018 1552   MCV 84.6 02/21/2024 0729   MCV 90 10/15/2018 1552   MCH 26.8 02/21/2024 0729   MCHC 31.7 02/21/2024 0729   RDW 16.5 (H) 02/21/2024 0729   RDW 14.4 10/15/2018 1552   LYMPHSABS 1.3 02/19/2024 1241   LYMPHSABS 1.2 10/15/2018 1552   MONOABS 0.8 02/19/2024 1241   EOSABS 0.0 02/19/2024 1241   EOSABS 0.2 10/15/2018 1552   BASOSABS 0.0 02/19/2024 1241   BASOSABS 0.1 10/15/2018 1552    BMET    Component Value Date/Time   NA 140 02/22/2024 0638   NA 140 06/02/2020 0000   K 4.4 02/22/2024 0638   CL 111 02/22/2024 0638   CO2 20 (L) 02/22/2024 0638   GLUCOSE 100 (H) 02/22/2024 0638   BUN 24 (H) 02/22/2024 0638   BUN 13 06/02/2020 0000   CREATININE 1.17 (H) 02/22/2024 0638   CREATININE 1.05 (H) 11/11/2023 1530   CALCIUM  8.7 (L) 02/22/2024 0638   CALCIUM  8.7 03/06/2019 0224   EGFR 50 (L) 11/11/2023 1530   GFRNONAA 44 (L) 02/22/2024 4098    IMAGING past 24 hours No results found.   Vitals:   02/21/24 1948 02/21/24 2333 02/22/24 0422 02/22/24 0813  BP: (!) 150/65 (!) 153/80 (!) 171/68 (!) 158/62  Pulse: 81 71 63 65  Resp: 18 16 16 16   Temp: 98.2 F (36.8 C) 97.8 F (36.6 C) 97.8 F (36.6 C) 97.9 F (36.6 C)  TempSrc: Oral Oral Oral Oral  SpO2: 98% 98% 98% 97%  Weight:      Height:         PHYSICAL EXAM General:  Alert, well-nourished, well-developed patient in no acute distress Psych: Calm and  cooperative CV: Regular rate and rhythm on monitor Respiratory:  Regular, unlabored respirations on room air   NEURO:  Mental Status: AA&Ox3 Speech/Language: speech is without dysarthria or aphasia.  Naming, repetition, fluency, and comprehension intact.  Cranial Nerves:  II: PERRL. Visual fields full.  III, IV, VI: EOMI. Eyelids elevate symmetrically.  V: Sensation is intact to light touch and symmetrical to face.  VII: Face is symmetrical resting and smiling VIII: hearing intact to voice. IX, X: Palate elevates symmetrically. Phonation is normal.  JX:[id number] shrug 5/5. XII: midline tongue protrusion Motor: Generalized weakness with 4+/5 overall strength. No focal deficits or drifts seen.  Tone: is normal and bulk is normal Sensation- Intact to light touch bilaterally.   Coordination: FTN intact but slow bilaterally. Gait- deferred  Most Recent NIH: 0    ASSESSMENT/PLAN  Tina Mercado is a 89 y.o. female with hx of COPD, GERD, HTN, HLD, prior strokes/TIA, ACD, hearing impairment who presented with back pain, weakness and feeling wobbly. Reports has vertigo for the last few years, with the weakness, she feels more wobbly. She was found to have an incidental punctate high posterior left frontal lobe stroke.  NIH on Admission: 0.  Hypertensive encephalopathy  Chronic gait disorder B12 deficiency Home meds:  none BP high on admission and during hospitalization Presented with back pain and imbalance Added Losartan  100->50->100 mg  Long term BP goal normotensive  Recommend PCP follow-up with better BP control, antihypertensive medication management for secondary stroke prevention.   B12 =163 on supplementation. Could definitely be contributing factor to patients reported dizziness.  Continue follow up with Dr. Omar Bibber at [facility]  Incidental stroke:  left frontal lobe precentral gyrus punctate infarct, etiology:  likely small vessel disease vs. Cardioembolic source CT head: No  acute abnormality. Small vessel disease. Atrophy.  Old right MCA territory infarct MRI  Single punctate 4 mm acute ischemic infarct high posterior left frontal lobe. Chronic right MCA distribution infarct MRA Head and Neck No flow reducing stenosis or embolic source to explain infarct. 2D Echo: LVEF 60 to 65%  Loop recorder interrogation no afib LDL 47 HgbA1c 5.7 UDS neg VTE prophylaxis - lovenox  aspirin  81 mg daily and clopidogrel  75 mg daily prior to admission, continue aspirin  81 mg daily and clopidogrel  75 mg daily  Therapy recommendations:  Home Health PT and Home Health OT.   Disposition:  back to facility  Hx of Stroke/TIA Chronic right MCA infarct seen on MRI 12/2021 admitted for aphasia, vound to have right CR infarct and old right parietal infarct. MRA negative with left fetal PCA. CUS neg. EF 60-65%. No DVT. LDL 238 and A1C 5.4. loop placed and discharged with DAPT and crestor  40.   Hyperlipidemia Home meds: Crestor  40 mg, resumed in hospital LDL 47, goal < 70 Not tolerating lipitor in the past Now on crestor  20 Continue statin at discharge  Other Stroke Risk Factors Advanced Age  Other Active Problems Acute Urinary Retention Negative UA Hematuria noted on attending note 4/26 could be from in and out intervention, not seen today by RN. No RBCs in UA. No need to stop DAPT at this time.  COPD Glaucoma Macrocytic anemia - low iron and ferritin level, recommend supplement  Hospital day # 0  Neurology will sign off. Please call with questions. Pt will follow up with Dr. Omar Bibber at [facility] in about 4-6 weeks. Thanks for the consult.   Consuelo Denmark, MD PhD Stroke Neurology 02/22/2024 11:11 AM

## 2024-02-22 NOTE — Progress Notes (Signed)
 Attempted to call pt's daughter per pt's request but sent straight to voicemail and unable to leave voicemail.

## 2024-02-22 NOTE — Progress Notes (Signed)
 Occupational Therapy Treatment Patient Details Name: Tina Mercado MRN: [id number] DOB: April 14, 1929 Today's Date: 02/22/2024   History of present illness Pt is a 89 y/o female who presents 02/19/2024 with CVA. MRI revealed single punctate 4 mm acute ischemic nonhemorrhagic infarct  involving the high posterior left frontal lobe, precentral gyrus. PMH significant for vertigo/dizziness and imbalance, Arrhythmia, COPD, cataract, glaucoma, heart murmur, HTN, mitral valve regurgitation, osteoporosis, TIA, corneal transplant, IM nail 2020 L.   OT comments  Pt progressing toward goals this session, needing CGA- min A for toileting and standing grooming tasks this session, CGA for bed mobility and min A for transfers/hall ambulation with mild L lateral lean intermittently when ambulating with rollator. Pt presenting with impairments listed below, will follow acutely. Recommend HHOT at d/c.       If plan is discharge home, recommend the following:  A little help with walking and/or transfers;A little help with bathing/dressing/bathroom;Assistance with cooking/housework;Assist for transportation;Help with stairs or ramp for entrance   Equipment Recommendations  None recommended by OT    Recommendations for Other Services PT consult    Precautions / Restrictions Precautions Precautions: Fall Recall of Precautions/Restrictions: Intact Restrictions Weight Bearing Restrictions Per Provider Order: No       Mobility Bed Mobility Overal bed mobility: Needs Assistance Bed Mobility: Supine to Sit     Supine to sit: Contact guard          Transfers Overall transfer level: Needs assistance Equipment used: Rollator (4 wheels) Transfers: Sit to/from Stand Sit to Stand: Min assist                 Balance Overall balance assessment: Needs assistance Sitting-balance support: Feet supported Sitting balance-Leahy Scale: Good     Standing balance support: During functional activity,  Reliant on assistive device for balance Standing balance-Leahy Scale: Fair Standing balance comment: static standing without AD                           ADL either performed or assessed with clinical judgement   ADL Overall ADL's : Needs assistance/impaired     Grooming: Wash/dry hands;Oral care;Contact guard assist;Standing Grooming Details (indicate cue type and reason): standing at sink                 Toilet Transfer: Minimal assistance;Rollator (4 wheels) Toilet Transfer Details (indicate cue type and reason): 3n1 over commode Toileting- Clothing Manipulation and Hygiene: Supervision/safety       Functional mobility during ADLs: Minimal assistance;Rollator (4 wheels)      Extremity/Trunk Assessment Upper Extremity Assessment Upper Extremity Assessment: Generalized weakness   Lower Extremity Assessment Lower Extremity Assessment: Defer to PT evaluation        Vision   Additional Comments: reports mulitple prior eye surgeries, hx of cataract and AMD, WFL for BADL   Perception Perception Perception: Not tested   Praxis Praxis Praxis: Not tested   Communication Communication Communication: Impaired Factors Affecting Communication: Hearing impaired   Cognition Arousal: Alert Behavior During Therapy: WFL for tasks assessed/performed Cognition: No apparent impairments                               Following commands: Intact        Cueing   Cueing Techniques: Verbal cues, Gestural cues  Exercises      Shoulder Instructions       General Comments SpO2 97% RA  HR 89 post ambulation    Pertinent Vitals/ Pain       Pain Assessment Pain Assessment: No/denies pain  Home Living                                          Prior Functioning/Environment              Frequency  Min 2X/week        Progress Toward Goals  OT Goals(current goals can now be found in the care plan section)  Progress  towards OT goals: Progressing toward goals  Acute Rehab OT Goals Patient Stated Goal: improve balance OT Goal Formulation: With patient Time For Goal Achievement: 03/05/24 Potential to Achieve Goals: Good ADL Goals Pt Will Perform Upper Body Dressing: Independently;sitting Pt Will Perform Lower Body Dressing: Independently;sitting/lateral leans;sit to/from stand Pt Will Transfer to Toilet: Independently;ambulating;regular height toilet Pt Will Perform Tub/Shower Transfer: Shower transfer;ambulating;shower seat Additional ADL Goal #1: pt will demonstrate good standing balance in prep for OOB activity  Plan      Co-evaluation                 AM-PAC OT "6 Clicks" Daily Activity     Outcome Measure   Help from another person eating meals?: None Help from another person taking care of personal grooming?: A Little Help from another person toileting, which includes using toliet, bedpan, or urinal?: A Little Help from another person bathing (including washing, rinsing, drying)?: A Little Help from another person to put on and taking off regular upper body clothing?: A Little Help from another person to put on and taking off regular lower body clothing?: A Little 6 Click Score: 19    End of Session Equipment Utilized During Treatment: Gait belt;Rollator (4 wheels)  OT Visit Diagnosis: Unsteadiness on feet (R26.81);Other abnormalities of gait and mobility (R26.89);Muscle weakness (generalized) (M62.81)   Activity Tolerance Patient tolerated treatment well   Patient Left in chair;with call bell/phone within reach;with chair alarm set   Nurse Communication Mobility status        Time: 8756-4332 OT Time Calculation (min): 29 min  Charges: OT General Charges $OT Visit: 1 Visit OT Treatments $Self Care/Home Management : 8-22 mins $Therapeutic Activity: 8-22 mins  Tina Mercado, OTD, OTR/L SecureChat Preferred Acute Rehab [phone]   Tina Mercado 02/22/2024,  9:39 AM

## 2024-02-22 NOTE — Plan of Care (Signed)
  Problem: Education: Goal: Knowledge of disease or condition will improve Outcome: Progressing   Problem: Education: Goal: Knowledge of secondary prevention will improve (MUST DOCUMENT ALL) Outcome: Progressing   Problem: Education: Goal: Knowledge of patient specific risk factors will improve (DELETE if not current risk factor) Outcome: Progressing   Problem: Ischemic Stroke/TIA Tissue Perfusion: Goal: Complications of ischemic stroke/TIA will be minimized Outcome: Progressing   Problem: Pain Managment: Goal: General experience of comfort will improve and/or be controlled Outcome: Progressing   Problem: Safety: Goal: Ability to remain free from injury will improve Outcome: Progressing

## 2024-02-22 NOTE — TOC Transition Note (Signed)
 Transition of Care [facility]) - Discharge Note   Patient Details  Name: Tina Mercado MRN: [id number] Date of Birth: [birth year]  Transition of Care [facility]) CM/SW Contact:  Jonathan Neighbor, RN Phone Number: 02/22/2024, 8:58 AM   Clinical Narrative:     Pt is discharging back to [facility]. HH arranged with [facility].  Pt has transportation home.  Final next level of care: Home w Home Health Services Barriers to Discharge: No Barriers Identified   Patient Goals and CMS Choice   CMS Medicare.gov Compare Post Acute Care list provided to:: Patient Choice offered to / list presented to : Patient      Discharge Placement                       Discharge Plan and Services Additional resources added to the After Visit Summary for                            [facility] Arranged: PT, OT HH Agency: Other - See comment International aid/development worker) Date [facility] Agency Contacted: 02/22/24      Social Drivers of Health (SDOH) Interventions SDOH Screenings   Food Insecurity: No Food Insecurity (02/20/2024)  Housing: Low Risk  (02/20/2024)  Transportation Needs: No Transportation Needs (02/20/2024)  Utilities: Not At Risk (02/20/2024)  Depression (PHQ2-9): Low Risk  (02/12/2023)  Social Connections: Socially Isolated (02/21/2024)  Tobacco Use: Low Risk  (02/21/2024)     Readmission Risk Interventions     No data to display

## 2024-02-22 NOTE — Progress Notes (Signed)
 DC paperwork reviewed with pt who verbalized understanding.  Tele and IV removed.  Pt only had her clothing, cell phone, and the jewelry that she was wearing.  Pt brought out for DC via WC with NT.  [facility] is here to pick the pt up.

## 2024-02-22 NOTE — Discharge Summary (Signed)
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   cyanocobalamin  1000 MCG tablet Take 1 tablet (1,000 mcg total) by mouth daily. Start taking on: February 24, 2024   famotidine  20 MG tablet Commonly known as: PEPCID  Take 20 mg by mouth daily.   ferrous sulfate 325 (  65 FE) MG EC tablet Take 1 tablet (325 mg total) by mouth daily with breakfast.   ketorolac  0.5 % ophthalmic solution Commonly known as: ACULAR  INSTILL 1 DROP INTO RIGHT EYE TWICE A DAY   losartan  100 MG tablet Commonly known as: COZAAR  Take 1 tablet (100 mg total) by mouth daily.   rosuvastatin  20 MG tablet Commonly known as: CRESTOR  Take 1 tablet (20 mg total) by mouth daily. What changed:  medication strength how much to take   senna-docusate 8.6-50 MG tablet Commonly known as: Senokot-S Take 2 tablets by mouth at bedtime.   timolol  0.5 % ophthalmic solution Commonly known as: TIMOPTIC  INSTILL 1 DROP INTO BOTH EYES TWICE A DAY   Vitamin D3 50 MCG (2000 UT) Tabs TAKE 1 TABLET (2,000 UNITS TOTAL) BY MOUTH AT BEDTIME.          Follow-up Information     Debbra Fairy, MD. Schedule an appointment as soon as possible for a visit in 1 month(s).    Specialties: Neurology, Radiology Contact information: [address] [place] [address] [phone]         [employer] Follow up.   Specialty: Physical Therapy Why: [facility] will arrange home health physical and occupational therapy. Contact information: [address] [place] [address] [phone]         Tye Gall, MD. Schedule an appointment as soon as possible for a visit in 1 week(s).   Specialty: Internal Medicine Why: post hospitalization follow up Contact information: [address] [place] [address] [phone]                 TOTAL DISCHARGE TIME: 35 minutes  Jillienne Egner Lyndon Santiago  [facility] Pager on [email or web address]  02/23/2024, 10:46 AM

## 2024-02-23 ENCOUNTER — Encounter: Payer: Self-pay | Admitting: Nurse Practitioner

## 2024-02-23 ENCOUNTER — Non-Acute Institutional Stay (SKILLED_NURSING_FACILITY): Payer: Self-pay | Admitting: Nurse Practitioner

## 2024-02-23 DIAGNOSIS — K449 Diaphragmatic hernia without obstruction or gangrene: Secondary | ICD-10-CM | POA: Diagnosis not present

## 2024-02-23 DIAGNOSIS — N1831 Chronic kidney disease, stage 3a: Secondary | ICD-10-CM

## 2024-02-23 DIAGNOSIS — K8020 Calculus of gallbladder without cholecystitis without obstruction: Secondary | ICD-10-CM | POA: Insufficient documentation

## 2024-02-23 DIAGNOSIS — M25511 Pain in right shoulder: Secondary | ICD-10-CM | POA: Diagnosis not present

## 2024-02-23 DIAGNOSIS — G629 Polyneuropathy, unspecified: Secondary | ICD-10-CM

## 2024-02-23 DIAGNOSIS — K219 Gastro-esophageal reflux disease without esophagitis: Secondary | ICD-10-CM

## 2024-02-23 DIAGNOSIS — K8070 Calculus of gallbladder and bile duct without cholecystitis without obstruction: Secondary | ICD-10-CM

## 2024-02-23 DIAGNOSIS — R29898 Other symptoms and signs involving the musculoskeletal system: Secondary | ICD-10-CM | POA: Diagnosis not present

## 2024-02-23 DIAGNOSIS — I639 Cerebral infarction, unspecified: Secondary | ICD-10-CM | POA: Diagnosis not present

## 2024-02-23 DIAGNOSIS — M6281 Muscle weakness (generalized): Secondary | ICD-10-CM | POA: Diagnosis not present

## 2024-02-23 DIAGNOSIS — R2681 Unsteadiness on feet: Secondary | ICD-10-CM | POA: Diagnosis not present

## 2024-02-23 DIAGNOSIS — R7989 Other specified abnormal findings of blood chemistry: Secondary | ICD-10-CM | POA: Diagnosis not present

## 2024-02-23 DIAGNOSIS — E7800 Pure hypercholesterolemia, unspecified: Secondary | ICD-10-CM | POA: Diagnosis not present

## 2024-02-23 DIAGNOSIS — J449 Chronic obstructive pulmonary disease, unspecified: Secondary | ICD-10-CM | POA: Diagnosis not present

## 2024-02-23 DIAGNOSIS — D649 Anemia, unspecified: Secondary | ICD-10-CM | POA: Diagnosis not present

## 2024-02-23 DIAGNOSIS — E538 Deficiency of other specified B group vitamins: Secondary | ICD-10-CM | POA: Diagnosis not present

## 2024-02-23 DIAGNOSIS — R001 Bradycardia, unspecified: Secondary | ICD-10-CM

## 2024-02-23 DIAGNOSIS — I69320 Aphasia following cerebral infarction: Secondary | ICD-10-CM | POA: Diagnosis not present

## 2024-02-23 DIAGNOSIS — I10 Essential (primary) hypertension: Secondary | ICD-10-CM

## 2024-02-23 NOTE — Assessment & Plan Note (Signed)
 balance issue, on Vit B12, f/u Vit B12 and TSH level, CBC/diff.

## 2024-02-23 NOTE — Assessment & Plan Note (Signed)
 s/p Loop recorder insertion 01/02/22, f/u cardiology. Echo normal LVEF, HR 53 02/19/24 EKG

## 2024-02-23 NOTE — Assessment & Plan Note (Signed)
 TSH 6.7 02/20/24

## 2024-02-23 NOTE — Assessment & Plan Note (Signed)
 cholelithiasis, LFT wnl, asymptomatic.

## 2024-02-23 NOTE — Assessment & Plan Note (Signed)
 LDL 47, on Rosuvastatin 

## 2024-02-23 NOTE — Assessment & Plan Note (Signed)
Stable, on Famotidine

## 2024-02-23 NOTE — Assessment & Plan Note (Addendum)
 normocytic, Fe 27, B12 163, HGb 10.8 02/21/24, placed on Vit B12, off Fe

## 2024-02-23 NOTE — Assessment & Plan Note (Signed)
 Hospitalized 02/19/24-02/22/24 for CVA, ambulates with walker and SBA, f/u neurology, already on ASA/Plavix , MRI brain 02/20/24 showed  No emergent finding. No flow reducing stenosis or embolic source to explain the patient's infarct. Arch and great vessel ostia not covered.

## 2024-02-23 NOTE — Assessment & Plan Note (Signed)
 Bun/creat 24/1.17 02/22/24, update CMP/eGFR

## 2024-02-23 NOTE — Assessment & Plan Note (Signed)
 controlled blood pressure, on Losartan 

## 2024-02-23 NOTE — Progress Notes (Unsigned)
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 the brain and surrounding structures were obtained without intravenous contrast. COMPARISON:  CT from earlier the same day as well as previous MRI from 05/05/2022. FINDINGS: Brain: Diffuse prominence of the CSF containing spaces compatible with generalized cerebral atrophy. Patchy and confluent T2/FLAIR hyperintensity involving the periventricular deep white matter both cerebral hemispheres, consistent with chronic small vessel ischemic disease, moderately advanced in nature. Few scatter remote lacunar infarcts present about the deep gray nuclei and cerebellum. Encephalomalacia and gliosis involving the posterior right frontoparietal region, consistent with a chronic right MCA distribution infarct. Single punctate 4 mm focus of restricted diffusion noted at the high posterior left frontal lobe, precentral gyrus (series 5, image 40). No associated hemorrhage or mass effect. No other evidence for acute or subacute ischemia. Gray-white matter  differentiation otherwise maintained. No acute or significant chronic intracranial blood products. No mass lesion or midline shift. No hydrocephalus or extra-axial fluid collection. Pituitary gland and suprasellar region within normal limits. Vascular: Major intracranial vascular flow voids are maintained. Skull and upper cervical spine: Craniocervical junction within normal limits. Bone marrow signal intensity normal. No scalp soft tissue abnormality. Sinuses/Orbits: Prior ocular lens replacement on the right. Paranasal sinuses are otherwise clear. No mastoid effusion. Other: None. IMPRESSION: 1. Single punctate 4 mm acute ischemic nonhemorrhagic infarct involving the high posterior left frontal lobe, precentral gyrus. 2. Underlying age-related cerebral atrophy with moderate chronic microvascular ischemic disease, with a chronic right MCA distribution infarct. Electronically Signed   By: Virgia Griffins M.D.   On: 02/19/2024 18:17   CT HEAD WO CONTRAST Result Date: 02/19/2024 CLINICAL DATA:  Neurologic deficit.  Stroke suspected. EXAM: CT HEAD WITHOUT CONTRAST TECHNIQUE: Contiguous axial images were obtained from the base of the skull through the vertex without  intravenous contrast. RADIATION DOSE REDUCTION: This exam was performed according to the departmental dose-optimization program which includes automated exposure control, adjustment of the mA and/or kV according to patient size and/or use of iterative reconstruction technique. COMPARISON:  Head CT dated 11/18/2023. FINDINGS: Brain: Moderate age-related atrophy and chronic microvascular ischemic changes. Old right MCA territory infarct. There is no acute intracranial hemorrhage. No mass effect or midline shift. No extra-axial fluid collection. Vascular: No hyperdense vessel or unexpected calcification. Skull: Normal. Negative for fracture or focal lesion. Sinuses/Orbits: No acute finding. Other: None IMPRESSION: 1. No acute intracranial pathology. 2.  Moderate age-related atrophy and chronic microvascular ischemic changes. Old right MCA territory infarct. Electronically Signed   By: Angus Bark M.D.   On: 02/19/2024 14:13   DG Chest Portable 1 View Result Date: 02/19/2024 CLINICAL DATA:  Transient back pain with weakness and dizziness. Generalized weakness for the past 2 days. EXAM: PORTABLE CHEST 1 VIEW COMPARISON:  02/25/2023 FINDINGS: Stable enlarged cardiac silhouette, tortuous and partially calcified thoracic aorta, loop recorder and large hiatal hernia. Mild linear atelectasis/scarring at the left lung base. The remainder of the lungs are clear, hyperexpanded and have normal vascularity. Diffuse osteopenia. IMPRESSION: 1. No acute abnormality. 2. COPD. 3. Stable cardiomegaly. 4. Large hiatal hernia. Electronically Signed   By: Catherin Closs M.D.   On: 02/19/2024 13:44    Assessment/Plan: Acute CVA (cerebrovascular accident) [facility]) Hospitalized 02/19/24-02/22/24 for CVA, ambulates with walker and SBA, f/u neurology, already on ASA/Plavix , MRI brain 02/20/24 showed  No emergent finding. No flow reducing stenosis or embolic source to explain the patient's infarct. Arch and great vessel ostia not covered.  Cholelithiasis cholelithiasis, LFT wnl, asymptomatic.   Vitamin B12 deficiency  balance issue, on Vit B12, f/u Vit B12 and TSH level, CBC/diff.   Elevated TSH TSH 6.7 02/20/24  Sinus bradycardia  s/p Loop recorder insertion 01/02/22, f/u cardiology. Echo normal LVEF, HR 53 02/19/24 EKG  Hiatal hernia with GERD Stable, on Famotidine   Essential hypertension controlled blood pressure, on Losartan   Chronic kidney disease, stage 3a (HCC) Bun/creat 24/1.17 02/22/24, update CMP/eGFR  Pure hypercholesterolemia LDL 47, on Rosuvastatin   Normocytic anemia  normocytic, Fe 27, B12 163, HGb 10.8 02/21/24, placed on Vit B12, off Fe  Neuropathy  off  Amitriptyline    COPD mixed type (HCC)  stable, on Budesonide /formoterol  inh, Fexofenadine .      Family/ staff Communication: plan of care reviewed with the patient and charge nurse  Labs/tests ordered:  CBC/diff, CMP/eGFR, TSH, Vit B12 in 2weeks.

## 2024-02-23 NOTE — Assessment & Plan Note (Signed)
 off  Amitriptyline 

## 2024-02-23 NOTE — Assessment & Plan Note (Signed)
 stable, on Budesonide /formoterol  inh, Fexofenadine .

## 2024-02-24 DIAGNOSIS — I69320 Aphasia following cerebral infarction: Secondary | ICD-10-CM | POA: Diagnosis not present

## 2024-02-24 DIAGNOSIS — R2681 Unsteadiness on feet: Secondary | ICD-10-CM | POA: Diagnosis not present

## 2024-02-24 DIAGNOSIS — M25511 Pain in right shoulder: Secondary | ICD-10-CM | POA: Diagnosis not present

## 2024-02-24 DIAGNOSIS — R29898 Other symptoms and signs involving the musculoskeletal system: Secondary | ICD-10-CM | POA: Diagnosis not present

## 2024-02-24 DIAGNOSIS — M6281 Muscle weakness (generalized): Secondary | ICD-10-CM | POA: Diagnosis not present

## 2024-02-24 LAB — CUP PACEART REMOTE DEVICE CHECK
Date Time Interrogation Session: 20250428083600
Implantable Pulse Generator Implant Date: 20230309
Pulse Gen Serial Number: 172093

## 2024-02-25 DIAGNOSIS — M25511 Pain in right shoulder: Secondary | ICD-10-CM | POA: Diagnosis not present

## 2024-02-25 DIAGNOSIS — R29898 Other symptoms and signs involving the musculoskeletal system: Secondary | ICD-10-CM | POA: Diagnosis not present

## 2024-02-25 DIAGNOSIS — R2681 Unsteadiness on feet: Secondary | ICD-10-CM | POA: Diagnosis not present

## 2024-02-25 DIAGNOSIS — M6281 Muscle weakness (generalized): Secondary | ICD-10-CM | POA: Diagnosis not present

## 2024-02-25 DIAGNOSIS — I69320 Aphasia following cerebral infarction: Secondary | ICD-10-CM | POA: Diagnosis not present

## 2024-02-25 DIAGNOSIS — Z23 Encounter for immunization: Secondary | ICD-10-CM | POA: Diagnosis not present

## 2024-02-26 ENCOUNTER — Non-Acute Institutional Stay (SKILLED_NURSING_FACILITY): Payer: Self-pay | Admitting: Sports Medicine

## 2024-02-26 DIAGNOSIS — E559 Vitamin D deficiency, unspecified: Secondary | ICD-10-CM | POA: Diagnosis not present

## 2024-02-26 DIAGNOSIS — J449 Chronic obstructive pulmonary disease, unspecified: Secondary | ICD-10-CM

## 2024-02-26 DIAGNOSIS — J302 Other seasonal allergic rhinitis: Secondary | ICD-10-CM

## 2024-02-26 DIAGNOSIS — I639 Cerebral infarction, unspecified: Secondary | ICD-10-CM

## 2024-02-26 DIAGNOSIS — I10 Essential (primary) hypertension: Secondary | ICD-10-CM

## 2024-02-26 DIAGNOSIS — Z09 Encounter for follow-up examination after completed treatment for conditions other than malignant neoplasm: Secondary | ICD-10-CM | POA: Diagnosis not present

## 2024-02-26 DIAGNOSIS — E538 Deficiency of other specified B group vitamins: Secondary | ICD-10-CM
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
## 2024-02-28 ENCOUNTER — Encounter: Payer: Self-pay | Admitting: Cardiology

## 2024-02-28 DIAGNOSIS — R29898 Other symptoms and signs involving the musculoskeletal system: Secondary | ICD-10-CM | POA: Diagnosis not present

## 2024-02-28 DIAGNOSIS — M25511 Pain in right shoulder: Secondary | ICD-10-CM | POA: Diagnosis not present

## 2024-02-28 DIAGNOSIS — R2681 Unsteadiness on feet: Secondary | ICD-10-CM | POA: Diagnosis not present

## 2024-02-28 DIAGNOSIS — M6281 Muscle weakness (generalized): Secondary | ICD-10-CM | POA: Diagnosis not present

## 2024-02-28 DIAGNOSIS — I69320 Aphasia following cerebral infarction: Secondary | ICD-10-CM | POA: Diagnosis not present

## 2024-02-29 ENCOUNTER — Encounter: Payer: Self-pay | Admitting: Sports Medicine

## 2024-03-01 DIAGNOSIS — M25511 Pain in right shoulder: Secondary | ICD-10-CM | POA: Diagnosis not present

## 2024-03-01 DIAGNOSIS — I69320 Aphasia following cerebral infarction: Secondary | ICD-10-CM | POA: Diagnosis not present

## 2024-03-01 DIAGNOSIS — M6281 Muscle weakness (generalized): Secondary | ICD-10-CM | POA: Diagnosis not present

## 2024-03-01 DIAGNOSIS — R2681 Unsteadiness on feet: Secondary | ICD-10-CM | POA: Diagnosis not present

## 2024-03-01 DIAGNOSIS — R29898 Other symptoms and signs involving the musculoskeletal system: Secondary | ICD-10-CM | POA: Diagnosis not present

## 2024-03-02 DIAGNOSIS — R29898 Other symptoms and signs involving the musculoskeletal system: Secondary | ICD-10-CM | POA: Diagnosis not present

## 2024-03-02 DIAGNOSIS — R2681 Unsteadiness on feet: Secondary | ICD-10-CM | POA: Diagnosis not present

## 2024-03-02 DIAGNOSIS — I69320 Aphasia following cerebral infarction: Secondary | ICD-10-CM | POA: Diagnosis not present

## 2024-03-02 DIAGNOSIS — M25511 Pain in right shoulder: Secondary | ICD-10-CM | POA: Diagnosis not present

## 2024-03-02 DIAGNOSIS — M6281 Muscle weakness (generalized): Secondary | ICD-10-CM | POA: Diagnosis not present

## 2024-03-02 NOTE — Progress Notes (Signed)
 [facility] Summary Report / Loop Recorder

## 2024-03-03 DIAGNOSIS — M25511 Pain in right shoulder: Secondary | ICD-10-CM | POA: Diagnosis not present

## 2024-03-03 DIAGNOSIS — I69320 Aphasia following cerebral infarction: Secondary | ICD-10-CM | POA: Diagnosis not present

## 2024-03-03 DIAGNOSIS — R2681 Unsteadiness on feet: Secondary | ICD-10-CM | POA: Diagnosis not present

## 2024-03-03 DIAGNOSIS — R29898 Other symptoms and signs involving the musculoskeletal system: Secondary | ICD-10-CM | POA: Diagnosis not present

## 2024-03-03 DIAGNOSIS — M6281 Muscle weakness (generalized): Secondary | ICD-10-CM | POA: Diagnosis not present

## 2024-03-08 DIAGNOSIS — E782 Mixed hyperlipidemia: Secondary | ICD-10-CM | POA: Diagnosis not present

## 2024-03-08 DIAGNOSIS — I69320 Aphasia following cerebral infarction: Secondary | ICD-10-CM | POA: Diagnosis not present

## 2024-03-08 DIAGNOSIS — M25511 Pain in right shoulder: Secondary | ICD-10-CM | POA: Diagnosis not present

## 2024-03-08 DIAGNOSIS — M6281 Muscle weakness (generalized): Secondary | ICD-10-CM | POA: Diagnosis not present

## 2024-03-08 DIAGNOSIS — R2681 Unsteadiness on feet: Secondary | ICD-10-CM | POA: Diagnosis not present

## 2024-03-08 DIAGNOSIS — R29898 Other symptoms and signs involving the musculoskeletal system: Secondary | ICD-10-CM | POA: Diagnosis not present

## 2024-03-09 DIAGNOSIS — M6281 Muscle weakness (generalized): Secondary | ICD-10-CM | POA: Diagnosis not present

## 2024-03-09 DIAGNOSIS — I69320 Aphasia following cerebral infarction: Secondary | ICD-10-CM | POA: Diagnosis not present

## 2024-03-09 DIAGNOSIS — R2681 Unsteadiness on feet: Secondary | ICD-10-CM | POA: Diagnosis not present

## 2024-03-09 DIAGNOSIS — M25511 Pain in right shoulder: Secondary | ICD-10-CM | POA: Diagnosis not present

## 2024-03-09 DIAGNOSIS — R29898 Other symptoms and signs involving the musculoskeletal system: Secondary | ICD-10-CM | POA: Diagnosis not present

## 2024-03-10 DIAGNOSIS — R29898 Other symptoms and signs involving the musculoskeletal system: Secondary | ICD-10-CM | POA: Diagnosis not present

## 2024-03-10 DIAGNOSIS — R2681 Unsteadiness on feet: Secondary | ICD-10-CM | POA: Diagnosis not present

## 2024-03-10 DIAGNOSIS — I69320 Aphasia following cerebral infarction: Secondary | ICD-10-CM | POA: Diagnosis not present

## 2024-03-10 DIAGNOSIS — M25511 Pain in right shoulder: Secondary | ICD-10-CM | POA: Diagnosis not present

## 2024-03-10 DIAGNOSIS — M6281 Muscle weakness (generalized): Secondary | ICD-10-CM | POA: Diagnosis not present

## 2024-03-11 DIAGNOSIS — R29898 Other symptoms and signs involving the musculoskeletal system: Secondary | ICD-10-CM | POA: Diagnosis not present

## 2024-03-11 DIAGNOSIS — M6281 Muscle weakness (generalized): Secondary | ICD-10-CM | POA: Diagnosis not present

## 2024-03-11 DIAGNOSIS — M25511 Pain in right shoulder: Secondary | ICD-10-CM | POA: Diagnosis not present

## 2024-03-11 DIAGNOSIS — R2681 Unsteadiness on feet: Secondary | ICD-10-CM | POA: Diagnosis not present

## 2024-03-11 DIAGNOSIS — I69320 Aphasia following cerebral infarction: Secondary | ICD-10-CM | POA: Diagnosis not present

## 2024-03-14 ENCOUNTER — Non-Acute Institutional Stay (SKILLED_NURSING_FACILITY): Payer: Self-pay | Admitting: Nurse Practitioner

## 2024-03-14 ENCOUNTER — Encounter: Payer: Self-pay | Admitting: Nurse Practitioner

## 2024-03-14 DIAGNOSIS — R7989 Other specified abnormal findings of blood chemistry: Secondary | ICD-10-CM

## 2024-03-14 DIAGNOSIS — E538 Deficiency of other specified B group vitamins: Secondary | ICD-10-CM | POA: Diagnosis not present

## 2024-03-14 DIAGNOSIS — K219 Gastro-esophageal reflux disease without esophagitis: Secondary | ICD-10-CM

## 2024-03-14 DIAGNOSIS — R2689 Other abnormalities of gait and mobility: Secondary | ICD-10-CM | POA: Insufficient documentation

## 2024-03-14 DIAGNOSIS — E7800 Pure hypercholesterolemia, unspecified: Secondary | ICD-10-CM | POA: Diagnosis not present

## 2024-03-14 DIAGNOSIS — N1831 Chronic kidney disease, stage 3a: Secondary | ICD-10-CM

## 2024-03-14 DIAGNOSIS — Z8673 Personal history of transient ischemic attack (TIA), and cerebral infarction without residual deficits: Secondary | ICD-10-CM

## 2024-03-14 DIAGNOSIS — D649 Anemia, unspecified: Secondary | ICD-10-CM

## 2024-03-14 DIAGNOSIS — I69320 Aphasia following cerebral infarction: Secondary | ICD-10-CM | POA: Diagnosis not present

## 2024-03-14 DIAGNOSIS — J449 Chronic obstructive pulmonary disease, unspecified: Secondary | ICD-10-CM | POA: Diagnosis not present

## 2024-03-14 DIAGNOSIS — R2681 Unsteadiness on feet: Secondary | ICD-10-CM | POA: Diagnosis not present

## 2024-03-14 DIAGNOSIS — M25511 Pain in right shoulder: Secondary | ICD-10-CM | POA: Diagnosis not present

## 2024-03-14 DIAGNOSIS — I10 Essential (primary) hypertension: Secondary | ICD-10-CM | POA: Diagnosis not present

## 2024-03-14 DIAGNOSIS — R29898 Other symptoms and signs involving the musculoskeletal system: Secondary | ICD-10-CM | POA: Diagnosis not present

## 2024-03-14 DIAGNOSIS — Z95818 Presence of other cardiac implants and grafts: Secondary | ICD-10-CM | POA: Diagnosis not present

## 2024-03-14 DIAGNOSIS — M6281 Muscle weakness (generalized): Secondary | ICD-10-CM | POA: Diagnosis not present

## 2024-03-14 NOTE — Assessment & Plan Note (Signed)
 LDL 47 02/20/24, on Rosuvastatin 

## 2024-03-14 NOTE — Assessment & Plan Note (Signed)
 balance issue, on Vit B12, Vit B12 694, Folate 4.2-added folic acid  1mg  every day 03/24/24. Also wants to try meclizine  25mg  every day x 1week, f/u ENT

## 2024-03-14 NOTE — Assessment & Plan Note (Signed)
 TSH 6.7 02/20/24>> 35 02/1324

## 2024-03-14 NOTE — Progress Notes (Signed)
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 Coke M.D.   On: 02/20/2024 07:44   MR BRAIN WO CONTRAST Result Date: 02/19/2024 CLINICAL DATA:  Initial evaluation for acute neuro deficit, stroke suspected. EXAM: MRI HEAD WITHOUT CONTRAST TECHNIQUE: Multiplanar, multiecho pulse sequences of the brain and surrounding structures were obtained without intravenous contrast. COMPARISON:  CT from earlier the same day as well as previous MRI from 05/05/2022. FINDINGS: Brain: Diffuse prominence of the CSF containing spaces compatible with generalized cerebral atrophy. Patchy and confluent T2/FLAIR hyperintensity involving the periventricular deep white matter both cerebral hemispheres, consistent with chronic small vessel ischemic disease, moderately advanced in nature. Few scatter remote lacunar infarcts present about the deep gray nuclei and cerebellum. Encephalomalacia and gliosis involving the posterior right frontoparietal region, consistent with a chronic right MCA distribution infarct. Single punctate 4 mm  focus of restricted diffusion noted at the high posterior left frontal lobe, precentral gyrus (series 5, image 40). No associated hemorrhage or mass effect. No other evidence for acute or subacute ischemia. Gray-white matter differentiation otherwise maintained. No acute or significant chronic intracranial blood products. No mass lesion or midline shift. No hydrocephalus or extra-axial fluid collection. Pituitary gland and suprasellar region within normal limits. Vascular: Major intracranial vascular flow voids are maintained. Skull and upper cervical spine: Craniocervical junction within normal limits. Bone marrow signal intensity normal. No scalp soft tissue abnormality. Sinuses/Orbits: Prior ocular lens replacement on the right.  Paranasal sinuses are otherwise clear. No mastoid effusion. Other: None. IMPRESSION: 1. Single punctate 4 mm acute ischemic nonhemorrhagic infarct involving the high posterior left frontal lobe, precentral gyrus. 2. Underlying age-related cerebral atrophy with moderate chronic microvascular ischemic disease, with a chronic right MCA distribution infarct. Electronically Signed   By: Virgia Griffins M.D.   On: 02/19/2024 18:17   CT HEAD WO CONTRAST Result Date: 02/19/2024 CLINICAL DATA:  Neurologic deficit.  Stroke suspected. EXAM: CT HEAD WITHOUT CONTRAST TECHNIQUE: Contiguous axial images were obtained from the base of the skull through the vertex without intravenous contrast. RADIATION DOSE REDUCTION: This exam was performed according to the departmental dose-optimization program which includes automated exposure control, adjustment of the mA and/or kV according to patient size and/or use of iterative reconstruction technique. COMPARISON:  Head CT dated 11/18/2023. FINDINGS: Brain: Moderate age-related atrophy and chronic microvascular ischemic changes. Old right MCA territory infarct. There is no acute intracranial hemorrhage. No mass effect or midline shift. No extra-axial  fluid collection. Vascular: No hyperdense vessel or unexpected calcification. Skull: Normal. Negative for fracture or focal lesion. Sinuses/Orbits: No acute finding. Other: None IMPRESSION: 1. No acute intracranial pathology. 2. Moderate age-related atrophy and chronic microvascular ischemic changes. Old right MCA territory infarct. Electronically Signed   By: Angus Bark M.D.   On: 02/19/2024 14:13   DG Chest Portable 1 View Result Date: 02/19/2024 CLINICAL DATA:  Transient back pain with weakness and dizziness. Generalized weakness for the past 2 days. EXAM: PORTABLE CHEST 1 VIEW COMPARISON:  02/25/2023 FINDINGS: Stable enlarged cardiac silhouette, tortuous and partially calcified thoracic aorta, loop recorder and large hiatal hernia. Mild linear atelectasis/scarring at the left lung base. The remainder of the lungs are clear, hyperexpanded and have normal vascularity. Diffuse osteopenia. IMPRESSION: 1. No acute abnormality. 2. COPD. 3. Stable cardiomegaly. 4. Large hiatal hernia. Electronically Signed   By: Catherin Closs M.D.   On: 02/19/2024 13:44    Assessment/Plan  Vitamin B12 deficiency balance issue, on Vit B12, Vit B12 694, Folate 4.2-added folic acid  1mg  every day 03/24/24. Also wants to try meclizine  25mg  every day x 1week, f/u ENT  Normocytic anemia  normocytic, Fe 27, B12 163, HGb 10.8 02/21/24>> 9.8 03/08/24, placed on Vit B12, not on Fe due to intolerance-wants to try NuIron, added folic acid  1mg  every day 03/14/24 2/2 folate 4.2 03/08/24  Elevated TSH  TSH 6.7 02/20/24>> 35 02/1324  Status post placement of implantable loop recorder s/p Loop recorder insertion 01/02/22, f/u cardiology. Echo normal LVEF 02/21/24. HR 53 02/19/24 EKG  GERD (gastroesophageal reflux disease) on Famotidine   Essential hypertension controlled blood pressure, on Losartan   Chronic kidney disease, stage 3a (HCC) Bun/creat 24/1.17 02/22/24<<28/1.12 03/08/24  History of CVA (cerebrovascular accident)  takes  Plavix , ASA, Rosuvastatin , f/u neurology.   Pure hypercholesterolemia LDL 47 02/20/24, on Rosuvastatin   COPD mixed type (HCC) stable, on Budesonide /formoterol  inh, Fexofenadine .   Balance problem balance issue, on Vit B12, Vit B12 694, Folate 4.2-added folic acid  1mg  every day 03/24/24. Also wants to try meclizine  25mg  every day x 1week, f/u ENT   Family/ staff Communication: plan of care reviewed with the patient and charge nurse.   Labs/tests ordered:  none

## 2024-03-14 NOTE — Assessment & Plan Note (Signed)
 stable, on Budesonide /formoterol  inh, Fexofenadine .

## 2024-03-14 NOTE — Assessment & Plan Note (Addendum)
 balance issue, on Vit B12, Vit B12 694, Folate 4.2-added folic acid  1mg  every day 03/24/24. Also wants to try meclizine  25mg  every day x 1week, f/u ENT

## 2024-03-14 NOTE — Assessment & Plan Note (Signed)
 takes Plavix , ASA, Rosuvastatin , f/u neurology.

## 2024-03-14 NOTE — Assessment & Plan Note (Signed)
 Bun/creat 24/1.17 02/22/24<<28/1.12 03/08/24

## 2024-03-14 NOTE — Assessment & Plan Note (Signed)
on Famotidine

## 2024-03-14 NOTE — Assessment & Plan Note (Addendum)
 normocytic, Fe 27, B12 163, HGb 10.8 02/21/24>> 9.8 03/08/24, placed on Vit B12, not on Fe due to intolerance-wants to try NuIron, added folic acid  1mg  every day 03/14/24 2/2 folate 4.2 03/08/24

## 2024-03-14 NOTE — Assessment & Plan Note (Signed)
 s/p Loop recorder insertion 01/02/22, f/u cardiology. Echo normal LVEF 02/21/24. HR 53 02/19/24 EKG

## 2024-03-14 NOTE — Assessment & Plan Note (Signed)
 controlled blood pressure, on Losartan 

## 2024-03-15 DIAGNOSIS — R2681 Unsteadiness on feet: Secondary | ICD-10-CM | POA: Diagnosis not present

## 2024-03-15 DIAGNOSIS — M6281 Muscle weakness (generalized): Secondary | ICD-10-CM | POA: Diagnosis not present

## 2024-03-15 DIAGNOSIS — M25511 Pain in right shoulder: Secondary | ICD-10-CM | POA: Diagnosis not present

## 2024-03-15 DIAGNOSIS — I69320 Aphasia following cerebral infarction: Secondary | ICD-10-CM | POA: Diagnosis not present

## 2024-03-15 DIAGNOSIS — R29898 Other symptoms and signs involving the musculoskeletal system: Secondary | ICD-10-CM | POA: Diagnosis not present

## 2024-03-16 DIAGNOSIS — R29898 Other symptoms and signs involving the musculoskeletal system: Secondary | ICD-10-CM | POA: Diagnosis not present

## 2024-03-16 DIAGNOSIS — R2681 Unsteadiness on feet: Secondary | ICD-10-CM | POA: Diagnosis not present

## 2024-03-16 DIAGNOSIS — M25511 Pain in right shoulder: Secondary | ICD-10-CM | POA: Diagnosis not present

## 2024-03-16 DIAGNOSIS — M6281 Muscle weakness (generalized): Secondary | ICD-10-CM | POA: Diagnosis not present

## 2024-03-16 DIAGNOSIS — I69320 Aphasia following cerebral infarction: Secondary | ICD-10-CM | POA: Diagnosis not present

## 2024-03-17 DIAGNOSIS — M6281 Muscle weakness (generalized): Secondary | ICD-10-CM | POA: Diagnosis not present

## 2024-03-17 DIAGNOSIS — I69320 Aphasia following cerebral infarction: Secondary | ICD-10-CM | POA: Diagnosis not present

## 2024-03-17 DIAGNOSIS — R29898 Other symptoms and signs involving the musculoskeletal system: Secondary | ICD-10-CM | POA: Diagnosis not present

## 2024-03-17 DIAGNOSIS — R2681 Unsteadiness on feet: Secondary | ICD-10-CM | POA: Diagnosis not present

## 2024-03-17 DIAGNOSIS — M25511 Pain in right shoulder: Secondary | ICD-10-CM | POA: Diagnosis not present

## 2024-03-18 DIAGNOSIS — I69320 Aphasia following cerebral infarction: Secondary | ICD-10-CM | POA: Diagnosis not present

## 2024-03-18 DIAGNOSIS — M25511 Pain in right shoulder: Secondary | ICD-10-CM | POA: Diagnosis not present

## 2024-03-18 DIAGNOSIS — M6281 Muscle weakness (generalized): Secondary | ICD-10-CM | POA: Diagnosis not present

## 2024-03-18 DIAGNOSIS — R29898 Other symptoms and signs involving the musculoskeletal system: Secondary | ICD-10-CM | POA: Diagnosis not present

## 2024-03-18 DIAGNOSIS — R2681 Unsteadiness on feet: Secondary | ICD-10-CM | POA: Diagnosis not present

## 2024-03-22 DIAGNOSIS — I69320 Aphasia following cerebral infarction: Secondary | ICD-10-CM | POA: Diagnosis not present

## 2024-03-22 DIAGNOSIS — R29898 Other symptoms and signs involving the musculoskeletal system: Secondary | ICD-10-CM | POA: Diagnosis not present

## 2024-03-22 DIAGNOSIS — R2681 Unsteadiness on feet: Secondary | ICD-10-CM | POA: Diagnosis not present

## 2024-03-22 DIAGNOSIS — M6281 Muscle weakness (generalized): Secondary | ICD-10-CM | POA: Diagnosis not present

## 2024-03-22 DIAGNOSIS — M25511 Pain in right shoulder: Secondary | ICD-10-CM | POA: Diagnosis not present

## 2024-03-23 DIAGNOSIS — R2681 Unsteadiness on feet: Secondary | ICD-10-CM | POA: Diagnosis not present

## 2024-03-23 DIAGNOSIS — M25511 Pain in right shoulder: Secondary | ICD-10-CM | POA: Diagnosis not present

## 2024-03-23 DIAGNOSIS — R29898 Other symptoms and signs involving the musculoskeletal system: Secondary | ICD-10-CM | POA: Diagnosis not present

## 2024-03-23 DIAGNOSIS — M6281 Muscle weakness (generalized): Secondary | ICD-10-CM | POA: Diagnosis not present

## 2024-03-23 DIAGNOSIS — I69320 Aphasia following cerebral infarction: Secondary | ICD-10-CM | POA: Diagnosis not present

## 2024-03-24 ENCOUNTER — Ambulatory Visit: Payer: Medicare Other

## 2024-03-24 DIAGNOSIS — Z8673 Personal history of transient ischemic attack (TIA), and cerebral infarction without residual deficits: Secondary | ICD-10-CM | POA: Diagnosis not present

## 2024-03-24 DIAGNOSIS — R2681 Unsteadiness on feet: Secondary | ICD-10-CM | POA: Diagnosis not present

## 2024-03-24 DIAGNOSIS — M6281 Muscle weakness (generalized): Secondary | ICD-10-CM | POA: Diagnosis not present

## 2024-03-24 DIAGNOSIS — R29898 Other symptoms and signs involving the musculoskeletal system: Secondary | ICD-10-CM | POA: Diagnosis not present

## 2024-03-24 DIAGNOSIS — I639 Cerebral infarction, unspecified: Secondary | ICD-10-CM | POA: Diagnosis not present

## 2024-03-24 DIAGNOSIS — Z947 Corneal transplant status: Secondary | ICD-10-CM | POA: Diagnosis not present

## 2024-03-24 DIAGNOSIS — H401211 Low-tension glaucoma, right eye, mild stage: Secondary | ICD-10-CM | POA: Diagnosis not present

## 2024-03-24 DIAGNOSIS — H2512 Age-related nuclear cataract, left eye: Secondary | ICD-10-CM | POA: Diagnosis not present

## 2024-03-24 DIAGNOSIS — Z961 Presence of intraocular lens: Secondary | ICD-10-CM | POA: Diagnosis not present

## 2024-03-24 DIAGNOSIS — H34832 Tributary (branch) retinal vein occlusion, left eye, with macular edema: Secondary | ICD-10-CM | POA: Diagnosis not present

## 2024-03-24 DIAGNOSIS — H353132 Nonexudative age-related macular degeneration, bilateral, intermediate dry stage: Secondary | ICD-10-CM | POA: Diagnosis not present

## 2024-03-24 DIAGNOSIS — I69320 Aphasia following cerebral infarction: Secondary | ICD-10-CM | POA: Diagnosis not present

## 2024-03-24 DIAGNOSIS — M25511 Pain in right shoulder: Secondary | ICD-10-CM | POA: Diagnosis not present

## 2024-03-24 LAB — CUP PACEART REMOTE DEVICE CHECK
Date Time Interrogation Session: 20250529014300
Implantable Pulse Generator Implant Date: 20230309
Pulse Gen Serial Number: 172093

## 2024-03-25 DIAGNOSIS — M25511 Pain in right shoulder: Secondary | ICD-10-CM | POA: Diagnosis not present

## 2024-03-25 DIAGNOSIS — R29898 Other symptoms and signs involving the musculoskeletal system: Secondary | ICD-10-CM | POA: Diagnosis not present

## 2024-03-25 DIAGNOSIS — R2681 Unsteadiness on feet: Secondary | ICD-10-CM | POA: Diagnosis not present

## 2024-03-25 DIAGNOSIS — I69320 Aphasia following cerebral infarction: Secondary | ICD-10-CM | POA: Diagnosis not present

## 2024-03-25 DIAGNOSIS — M6281 Muscle weakness (generalized): Secondary | ICD-10-CM | POA: Diagnosis not present

## 2024-03-26 DIAGNOSIS — M25511 Pain in right shoulder: Secondary | ICD-10-CM | POA: Diagnosis not present

## 2024-03-26 DIAGNOSIS — R29898 Other symptoms and signs involving the musculoskeletal system: Secondary | ICD-10-CM | POA: Diagnosis not present

## 2024-03-26 DIAGNOSIS — R2681 Unsteadiness on feet: Secondary | ICD-10-CM | POA: Diagnosis not present

## 2024-03-26 DIAGNOSIS — I69320 Aphasia following cerebral infarction: Secondary | ICD-10-CM | POA: Diagnosis not present

## 2024-03-26 DIAGNOSIS — M6281 Muscle weakness (generalized): Secondary | ICD-10-CM | POA: Diagnosis not present

## 2024-03-27 ENCOUNTER — Ambulatory Visit: Payer: Self-pay | Admitting: Cardiology

## 2024-03-28 ENCOUNTER — Non-Acute Institutional Stay (SKILLED_NURSING_FACILITY): Payer: Self-pay | Admitting: Nurse Practitioner

## 2024-03-28 DIAGNOSIS — Z0000 Encounter for general adult medical examination without abnormal findings: Secondary | ICD-10-CM

## 2024-03-28 NOTE — Progress Notes (Signed)
 Subjective:   Tina Mercado is a 89 y.o. female who presents for Medicare Annual (Subsequent) preventive examination [facility]  Visit Complete: In person  Patient Medicare AWV questionnaire was completed by the patient on 03/28/24; I have confirmed that all information answered by patient is correct and no changes since this date.  Cardiac Risk Factors include: advanced age (>36men, >33 women);hypertension     Objective:     Today's Vitals   03/28/24 1059  BP: (!) 152/69  Pulse: 76  Resp: 14  Temp: 98.3 F (36.8 C)  SpO2: 95%  Weight: 121 lb (54.9 kg)   Body mass index is 22.13 kg/m.     02/20/2024    5:33 AM 12/11/2023    8:49 AM 11/05/2023    2:19 PM 10/16/2023    6:13 PM 08/28/2023   10:32 AM 04/02/2023    1:06 PM 04/01/2023    9:25 AM  Advanced Directives  Does Patient Have a Medical Advance Directive? No Yes Yes Yes Yes Yes Yes  Type of Forensic scientist Power of [employer] Power of [place];Living will;Out of facility DNR (pink MOST or yellow form) Out of facility DNR (pink MOST or yellow form) Out of facility DNR (pink MOST or yellow form)  Does patient want to make changes to medical advance directive?  No - Patient declined No - Patient declined No - Patient declined No - Patient declined No - Patient declined No - Patient declined  Copy of Healthcare Power of Attorney in Chart?     No - copy requested    Would patient like information on creating a medical advance directive? No - Patient declined        Pre-existing out of facility DNR order (yellow form or pink MOST form)      Yellow form placed in chart (order not valid for inpatient use) Yellow form placed in chart (order not valid for inpatient use)    Current Medications (verified) Outpatient Encounter Medications as of 03/28/2024  Medication Sig   aspirin  EC 81 MG tablet Take 1 tablet (81 mg total) by mouth daily. Swallow whole.    bisacodyl  (DULCOLAX) 10 MG suppository Place 1 suppository (10 mg total) rectally daily as needed for moderate constipation.   budesonide -formoterol  (SYMBICORT ) 80-4.5 MCG/ACT inhaler Inhale 2 puffs into the lungs 2 (two) times daily.   Cholecalciferol  (VITAMIN D3) 50 MCG (2000 UT) TABS TAKE 1 TABLET (2,000 UNITS TOTAL) BY MOUTH AT BEDTIME.   clopidogrel  (PLAVIX ) 75 MG tablet TAKE 1 TABLET BY MOUTH EVERY DAY   cyanocobalamin  1000 MCG tablet Take 1 tablet (1,000 mcg total) by mouth daily.   famotidine  (PEPCID ) 20 MG tablet Take 20 mg by mouth daily.   ferrous sulfate  325 (65 FE) MG EC tablet Take 1 tablet (325 mg total) by mouth daily with breakfast.   ketorolac  (ACULAR ) 0.5 % ophthalmic solution INSTILL 1 DROP INTO RIGHT EYE TWICE A DAY   losartan  (COZAAR ) 25 MG tablet Take 25 mg by mouth daily.   rosuvastatin  (CRESTOR ) 20 MG tablet Take 1 tablet (20 mg total) by mouth daily.   senna-docusate (SENOKOT-S) 8.6-50 MG tablet Take 2 tablets by mouth at bedtime.   timolol  (TIMOPTIC ) 0.5 % ophthalmic solution INSTILL 1 DROP INTO BOTH EYES TWICE A DAY   Facility-Administered Encounter Medications as of 03/28/2024  Medication   fexofenadine  (ALLEGRA ) tablet 180 mg    Allergies (verified) Contrast media [iodinated contrast media];  Oscal [oyster shell calcium -d]; Wixela inhub [fluticasone -salmeterol]; Antihistamines, diphenhydramine-type; Celecoxib; Clemastine fumarate; Darvocet [propoxyphene n-acetaminophen ]; Diphenhydramine hcl; Feldene [piroxicam]; Gabapentin ; Meperidine; Norvasc  [amlodipine  besylate]; Other; Sulfa antibiotics; Tea; and Iodine-131   History: Past Medical History:  Diagnosis Date   Allergy    Arrhythmia    Cataract    COPD (chronic obstructive pulmonary disease) (HCC)    GERD (gastroesophageal reflux disease)    Glaucoma    Hearing loss    does not wear hearing aids   Heart murmur    History of anemia    History of diastolic dysfunction    History of seasonal allergies     Hyperlipidemia    Hypertension    Large hiatal hernia    see on thoracic spine xray   Mitral regurgitation    mild to moderate   Osteoporosis    TIA (transient ischemic attack)    Wears glasses    readers   Past Surgical History:  Procedure Laterality Date   APPENDECTOMY     BREAST SURGERY     childbirth     x 1   CORNEAL TRANSPLANT  04/26/2006   EYE SURGERY     INTRAMEDULLARY (IM) NAIL INTERTROCHANTERIC Left 03/03/2019   Procedure: INTRAMEDULLARY (IM) NAIL INTERTROCHANTRIC;  Surgeon: Laneta Pintos, MD;  Location: [facility] OR;  Service: Orthopedics;  Laterality: Left;   LOOP RECORDER INSERTION N/A 01/02/2022   Procedure: LOOP RECORDER INSERTION;  Surgeon: Boyce Byes, MD;  Location: [facility] INVASIVE CV LAB;  Service: Cardiovascular;  Laterality: N/A;   TONSILLECTOMY AND ADENOIDECTOMY     age 84yrs   Family History  Problem Relation Age of Onset   Heart disease Mother    Emphysema Father    Emphysema Brother    COPD Brother    Social History   Socioeconomic History   Marital status: Single    Spouse name: Not on file   Number of children: Not on file   Years of education: Not on file   Highest education level: Not on file  Occupational History   Not on file  Tobacco Use   Smoking status: Never   Smokeless tobacco: Never  Vaping Use   Vaping status: Never Used  Substance and Sexual Activity   Alcohol use: No    Alcohol/week: 0.0 standard drinks of alcohol   Drug use: No   Sexual activity: Never    Birth control/protection: Abstinence, Post-menopausal  Other Topics Concern   Not on file  Social History Narrative   Right handed   Lives at [facility] in independent living    Social Drivers of Health   Financial Resource Strain: Not on file  Food Insecurity: No Food Insecurity (02/20/2024)   Hunger Vital Sign    Worried About Running Out of Food in the Last Year: Never true    Ran Out of Food in the Last Year: Never true  Transportation  Needs: No Transportation Needs (02/20/2024)   PRAPARE - Administrator, Civil Service (Medical): No    Lack of Transportation (Non-Medical): No  Physical Activity: Not on file  Stress: Not on file  Social Connections: Socially Isolated (02/21/2024)   Social Connection and Isolation Panel [NHANES]    Frequency of Communication with Friends and Family: More than three times a week    Frequency of Social Gatherings with Friends and Family: More than three times a week    Attends Religious Services: Never    Database administrator or  Organizations: No    Attends Banker Meetings: Never    Marital Status: Widowed    Tobacco Counseling Counseling given: Not Answered   Clinical Intake:  Pre-visit preparation completed: Yes  Pain : No/denies pain     BMI - recorded: 23.52 Nutritional Status: BMI of 19-24  Normal Nutritional Risks: None Diabetes: No  How often do you need to have someone help you when you read instructions, pamphlets, or other written materials from your doctor or pharmacy?: 2 - Rarely What is the last grade level you completed in school?: college  Interpreter Needed?: No  Information entered by :: Dae Highley Daylene Evangelist NP   Activities of Daily Living    03/28/2024    4:10 PM 03/28/2024    4:09 PM  In your present state of health, do you have any difficulty performing the following activities:  Managing your Finances? N   Housekeeping or managing your Housekeeping? Colie Dawes    Patient Care Team: Tye Gall, MD as PCP - General (Internal Medicine) Audery Blazing Deannie Fabian, MD as PCP - Cardiology (Cardiology) Lorraine Roses, MD as Consulting Physician (Pulmonary Disease) Lenise Quince, MD as Consulting Physician (Cardiology) Edyth Grana, MD as Referring Physician (Ophthalmology) Ladon Pickler, MD as Referring Physician (Ophthalmology) Duke, Warren Haber, PA as Physician Assistant (Cardiology)  Indicate any recent Medical Services  you may have received from other than [facility] providers in the past year (date may be approximate).     Assessment:    This is a routine wellness examination for [place].  Hearing/Vision screen No results found.   Goals Addressed             This Visit's Progress    Maintain Mobility and Function       Evidence-based guidance:  Emphasize the importance of physical activity and aerobic exercise as included in treatment plan; assess barriers to adherence; consider patient's abilities and preferences.  Encourage gradual increase in activity or exercise instead of stopping if pain occurs.  Reinforce individual therapy exercise prescription, such as strengthening, stabilization and stretching programs.  Promote optimal body mechanics to stabilize the spine with lifting and functional activity.  Encourage activity and mobility modifications to facilitate optimal function, such as using a log roll for bed mobility or dressing from a seated position.  Reinforce individual adaptive equipment recommendations to limit excessive spinal movements, such as a Event organiser.  Assess adequacy of sleep; encourage use of sleep hygiene techniques, such as bedtime routine; use of white noise; dark, cool bedroom; avoiding daytime naps, heavy meals or exercise before bedtime.  Promote positions and modification to optimize sleep and sexual activity; consider pillows or positioning devices to assist in maintaining neutral spine.  Explore options for applying ergonomic principles at work and home, such as frequent position changes, using ergonomically designed equipment and working at optimal height.  Promote modifications to increase comfort with driving such as lumbar support, optimizing seat and steering wheel position, using cruise control and taking frequent rest stops to stretch and walk.   Notes:        Depression Screen    03/28/2024    4:12 PM 02/12/2023    3:20 PM 10/15/2018   12:33 PM  11/12/2017    3:22 PM 10/26/2017   11:57 AM 09/28/2017    3:08 PM 09/15/2017    3:54 PM  PHQ 2/9 Scores  PHQ - 2 Score 0 0 1 0 0 0 0    Fall Risk  11/05/2023    2:18 PM 08/28/2023   10:32 AM 04/02/2023    1:06 PM 02/12/2023    3:55 PM 02/12/2023    3:19 PM  Fall Risk   Falls in the past year? 0 0 0 0 0  Number falls in past yr: 0 0 0  0  Injury with Fall? 0 0   0  Risk for fall due to :  No Fall Risks History of fall(s)  History of fall(s)  Follow up  Falls evaluation completed;Education provided;Falls prevention discussed Falls evaluation completed  Falls evaluation completed    MEDICARE RISK AT HOME: Medicare Risk at Home Any stairs in or around the home?: Yes If so, are there any without handrails?: No Home free of loose throw rugs in walkways, pet beds, electrical cords, etc?: Yes Adequate lighting in your home to reduce risk of falls?: Yes Life alert?: No Use of a cane, walker or w/c?: Yes Grab bars in the bathroom?: Yes Shower chair or bench in shower?: Yes Elevated toilet seat or a handicapped toilet?: Yes  TIMED UP AND GO:  Was the test performed?  Yes  Length of time to ambulate 10 feet: 18 sec Gait slow and steady with assistive device    Cognitive Function:    02/12/2023    3:21 PM 10/15/2018    1:17 PM  MMSE - Mini Mental State Exam  Orientation to time 5 5  Orientation to Place 5 5  Registration 3 3  Attention/ Calculation 5 5  Recall 3 3  Language- name 2 objects 2 2  Language- repeat 1 1  Language- follow 3 step command 3 3  Language- read & follow direction 1 1  Write a sentence 1 1  Copy design 1 1  Total score 30 30        10/15/2018   11:35 AM  6CIT Screen  What Year? 0 points  What month? 0 points  What time? 0 points  Count back from 20 0 points  Months in reverse 0 points  Repeat phrase 0 points  Total Score 0 points    Immunizations Immunization History  Administered Date(s) Administered   Fluad Trivalent(High Dose 65+)  08/26/2023   Influenza Split 07/27/2014   Influenza, High Dose Seasonal PF 08/30/2014, 10/10/2015, 08/13/2016, 07/19/2017, 10/05/2018   Influenza,inj,Quad PF,6+ Mos 08/28/2015   Moderna Sars-Covid-2 Vaccination 10/31/2019, 11/28/2019   Pneumococcal Conjugate-13 07/06/2014   Pneumococcal Polysaccharide-23 09/28/2017   Tdap 04/01/2016    TDAP status: Up to date  Flu Vaccine status: Up to date  Pneumococcal vaccine status: Up to date  Covid-19 vaccine status: Declined, Education has been provided regarding the importance of this vaccine but patient still declined. Advised may receive this vaccine at local pharmacy or Health Dept.or vaccine clinic. Aware to provide a copy of the vaccination record if obtained from local pharmacy or Health Dept. Verbalized acceptance and understanding.  Qualifies for Shingles Vaccine? Yes   Zostavax completed Yes   Shingrix Completed?: No.    Education has been provided regarding the importance of this vaccine. Patient has been advised to call insurance company to determine out of pocket expense if they have not yet received this vaccine. Advised may also receive vaccine at local pharmacy or Health Dept. Verbalized acceptance and understanding.  Screening Tests Health Maintenance  Topic Date Due   INFLUENZA VACCINE  05/27/2024   Medicare Annual Wellness (AWV)  03/28/2025   DTaP/Tdap/Td (2 - Td or Tdap) 04/01/2026   Pneumonia Vaccine  84+ Years old  Completed   DEXA SCAN  Completed   HPV VACCINES  Aged Out   Meningococcal B Vaccine  Aged Out   COVID-19 Vaccine  Discontinued   Zoster Vaccines- Shingrix  Discontinued    Health Maintenance  There are no preventive care reminders to display for this patient.   Colorectal cancer screening: No longer required.   Mammogram status: No longer required due to aged out.  Bone Density status: Completed 2019. Results reflect: Bone density results: OSTEOPOROSIS. Repeat every never per patient's request  years.  Lung Cancer Screening: (Low Dose CT Chest recommended if Age 43-80 years, 20 pack-year currently smoking OR have quit w/in 15years.) does not qualify.   Lung Cancer Screening Referral: NA  Additional Screening:  Hepatitis C Screening: does not qualify;   Vision Screening: Recommended annual ophthalmology exams for early detection of glaucoma and other disorders of the eye. Is the patient up to date with their annual eye exam?  Yes  Who is the provider or what is the name of the office in which the patient attends annual eye exams? The patient will provide If pt is not established with a provider, would they like to be referred to a provider to establish care? Yes .   Dental Screening: Recommended annual dental exams for proper oral hygiene  Diabetic Foot Exam: NA  Community Resource Referral / Chronic Care Management: CRR required this visit?  No   CCM required this visit?  No     Plan:     I have personally reviewed and noted the following in the patient's chart:   Medical and social history Use of alcohol, tobacco or illicit drugs  Current medications and supplements including opioid prescriptions. Patient is not currently taking opioid prescriptions. Functional ability and status Nutritional status Physical activity Advanced directives List of other physicians Hospitalizations, surgeries, and ER visits in previous 12 months Vitals Screenings to include cognitive, depression, and falls Referrals and appointments  In addition, I have reviewed and discussed with patient certain preventive protocols, quality metrics, and best practice recommendations. A written personalized care plan for preventive services as well as general preventive health recommendations were provided to patient.     Gracelee Stemmler X Everard Interrante, NP   03/29/2024   After Visit Summary: (In Person-Declined) Patient declined AVS at this time.

## 2024-03-29 ENCOUNTER — Encounter: Payer: Self-pay | Admitting: Nurse Practitioner

## 2024-03-29 DIAGNOSIS — M6281 Muscle weakness (generalized): Secondary | ICD-10-CM | POA: Diagnosis not present

## 2024-03-29 DIAGNOSIS — I69320 Aphasia following cerebral infarction: Secondary | ICD-10-CM | POA: Diagnosis not present

## 2024-03-29 DIAGNOSIS — M25511 Pain in right shoulder: Secondary | ICD-10-CM | POA: Diagnosis not present

## 2024-03-29 DIAGNOSIS — R29898 Other symptoms and signs involving the musculoskeletal system: Secondary | ICD-10-CM | POA: Diagnosis not present

## 2024-03-29 DIAGNOSIS — R2681 Unsteadiness on feet: Secondary | ICD-10-CM | POA: Diagnosis not present

## 2024-03-29 DIAGNOSIS — R278 Other lack of coordination: Secondary | ICD-10-CM | POA: Diagnosis not present

## 2024-03-30 DIAGNOSIS — R2681 Unsteadiness on feet: Secondary | ICD-10-CM | POA: Diagnosis not present

## 2024-03-30 DIAGNOSIS — R278 Other lack of coordination: Secondary | ICD-10-CM | POA: Diagnosis not present

## 2024-03-30 DIAGNOSIS — M25511 Pain in right shoulder: Secondary | ICD-10-CM | POA: Diagnosis not present

## 2024-03-30 DIAGNOSIS — M6281 Muscle weakness (generalized): Secondary | ICD-10-CM | POA: Diagnosis not present

## 2024-03-30 DIAGNOSIS — I69320 Aphasia following cerebral infarction: Secondary | ICD-10-CM | POA: Diagnosis not present

## 2024-03-30 DIAGNOSIS — R29898 Other symptoms and signs involving the musculoskeletal system: Secondary | ICD-10-CM | POA: Diagnosis not present

## 2024-03-31 DIAGNOSIS — M6281 Muscle weakness (generalized): Secondary | ICD-10-CM | POA: Diagnosis not present

## 2024-03-31 DIAGNOSIS — M25511 Pain in right shoulder: Secondary | ICD-10-CM | POA: Diagnosis not present

## 2024-03-31 DIAGNOSIS — I69320 Aphasia following cerebral infarction: Secondary | ICD-10-CM | POA: Diagnosis not present

## 2024-03-31 DIAGNOSIS — R29898 Other symptoms and signs involving the musculoskeletal system: Secondary | ICD-10-CM | POA: Diagnosis not present

## 2024-03-31 DIAGNOSIS — R278 Other lack of coordination: Secondary | ICD-10-CM | POA: Diagnosis not present

## 2024-03-31 DIAGNOSIS — R2681 Unsteadiness on feet: Secondary | ICD-10-CM | POA: Diagnosis not present

## 2024-04-01 DIAGNOSIS — R278 Other lack of coordination: Secondary | ICD-10-CM | POA: Diagnosis not present

## 2024-04-01 DIAGNOSIS — R29898 Other symptoms and signs involving the musculoskeletal system: Secondary | ICD-10-CM | POA: Diagnosis not present

## 2024-04-01 DIAGNOSIS — M6281 Muscle weakness (generalized): Secondary | ICD-10-CM | POA: Diagnosis not present

## 2024-04-01 DIAGNOSIS — M25511 Pain in right shoulder: Secondary | ICD-10-CM | POA: Diagnosis not present

## 2024-04-01 DIAGNOSIS — R2681 Unsteadiness on feet: Secondary | ICD-10-CM | POA: Diagnosis not present

## 2024-04-01 DIAGNOSIS — I69320 Aphasia following cerebral infarction: Secondary | ICD-10-CM | POA: Diagnosis not present

## 2024-04-06 DIAGNOSIS — M6281 Muscle weakness (generalized): Secondary | ICD-10-CM | POA: Diagnosis not present

## 2024-04-06 DIAGNOSIS — R278 Other lack of coordination: Secondary | ICD-10-CM | POA: Diagnosis not present

## 2024-04-06 DIAGNOSIS — R2681 Unsteadiness on feet: Secondary | ICD-10-CM | POA: Diagnosis not present

## 2024-04-06 DIAGNOSIS — M25511 Pain in right shoulder: Secondary | ICD-10-CM | POA: Diagnosis not present

## 2024-04-06 DIAGNOSIS — I69320 Aphasia following cerebral infarction: Secondary | ICD-10-CM | POA: Diagnosis not present

## 2024-04-06 DIAGNOSIS — R29898 Other symptoms and signs involving the musculoskeletal system: Secondary | ICD-10-CM | POA: Diagnosis not present

## 2024-04-08 ENCOUNTER — Encounter: Payer: Self-pay | Admitting: Sports Medicine

## 2024-04-08 ENCOUNTER — Non-Acute Institutional Stay: Admitting: Sports Medicine

## 2024-04-08 DIAGNOSIS — K219 Gastro-esophageal reflux disease without esophagitis: Secondary | ICD-10-CM | POA: Diagnosis not present

## 2024-04-08 DIAGNOSIS — N1831 Chronic kidney disease, stage 3a: Secondary | ICD-10-CM

## 2024-04-08 DIAGNOSIS — J449 Chronic obstructive pulmonary disease, unspecified: Secondary | ICD-10-CM

## 2024-04-08 DIAGNOSIS — R278 Other lack of coordination: Secondary | ICD-10-CM | POA: Diagnosis not present

## 2024-04-08 DIAGNOSIS — E538 Deficiency of other specified B group vitamins: Secondary | ICD-10-CM

## 2024-04-08 DIAGNOSIS — E559 Vitamin D deficiency, unspecified: Secondary | ICD-10-CM | POA: Diagnosis not present

## 2024-04-08 DIAGNOSIS — I10 Essential (primary) hypertension: Secondary | ICD-10-CM

## 2024-04-08 DIAGNOSIS — R2681 Unsteadiness on feet: Secondary | ICD-10-CM | POA: Diagnosis not present

## 2024-04-08 DIAGNOSIS — R29898 Other symptoms and signs involving the musculoskeletal system: Secondary | ICD-10-CM | POA: Diagnosis not present

## 2024-04-08 DIAGNOSIS — I69320 Aphasia following cerebral infarction: Secondary | ICD-10-CM | POA: Diagnosis not present

## 2024-04-08 DIAGNOSIS — Z862 Personal history of diseases of the blood and blood-forming organs and certain disorders involving the immune mechanism: Secondary | ICD-10-CM

## 2024-04-08 DIAGNOSIS — I639 Cerebral infarction, unspecified: Secondary | ICD-10-CM

## 2024-04-08 DIAGNOSIS — M25511 Pain in right shoulder: Secondary | ICD-10-CM | POA: Diagnosis not present

## 2024-04-08 DIAGNOSIS — M6281 Muscle weakness (generalized): Secondary | ICD-10-CM | POA: Diagnosis not present
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
## 2024-04-12 DIAGNOSIS — M810 Age-related osteoporosis without current pathological fracture: Secondary | ICD-10-CM | POA: Diagnosis not present

## 2024-04-12 DIAGNOSIS — I10 Essential (primary) hypertension: Secondary | ICD-10-CM | POA: Diagnosis not present

## 2024-04-12 NOTE — Progress Notes (Signed)
 Bsx Loop Recorder

## 2024-04-12 NOTE — Addendum Note (Signed)
 Addended by: Edra Govern D on: 04/12/2024 12:38 PM   Modules accepted: Orders

## 2024-04-13 DIAGNOSIS — H02401 Unspecified ptosis of right eyelid: Secondary | ICD-10-CM | POA: Diagnosis not present

## 2024-04-13 DIAGNOSIS — H353132 Nonexudative age-related macular degeneration, bilateral, intermediate dry stage: Secondary | ICD-10-CM | POA: Diagnosis not present

## 2024-04-13 DIAGNOSIS — H2512 Age-related nuclear cataract, left eye: Secondary | ICD-10-CM | POA: Diagnosis not present

## 2024-04-13 DIAGNOSIS — H34832 Tributary (branch) retinal vein occlusion, left eye, with macular edema: Secondary | ICD-10-CM | POA: Diagnosis not present

## 2024-04-13 DIAGNOSIS — H401211 Low-tension glaucoma, right eye, mild stage: Secondary | ICD-10-CM | POA: Diagnosis not present

## 2024-04-13 DIAGNOSIS — H18512 Endothelial corneal dystrophy, left eye: Secondary | ICD-10-CM | POA: Diagnosis not present

## 2024-04-13 DIAGNOSIS — Z961 Presence of intraocular lens: Secondary | ICD-10-CM | POA: Diagnosis not present

## 2024-04-13 DIAGNOSIS — Z947 Corneal transplant status: Secondary | ICD-10-CM | POA: Diagnosis not present

## 2024-04-14 DIAGNOSIS — R29898 Other symptoms and signs involving the musculoskeletal system: Secondary | ICD-10-CM | POA: Diagnosis not present

## 2024-04-14 DIAGNOSIS — M25511 Pain in right shoulder: Secondary | ICD-10-CM | POA: Diagnosis not present

## 2024-04-14 DIAGNOSIS — R2681 Unsteadiness on feet: Secondary | ICD-10-CM | POA: Diagnosis not present

## 2024-04-14 DIAGNOSIS — M6281 Muscle weakness (generalized): Secondary | ICD-10-CM | POA: Diagnosis not present

## 2024-04-14 DIAGNOSIS — R278 Other lack of coordination: Secondary | ICD-10-CM | POA: Diagnosis not present

## 2024-04-14 DIAGNOSIS — I69320 Aphasia following cerebral infarction: Secondary | ICD-10-CM | POA: Diagnosis not present

## 2024-04-18 DIAGNOSIS — M25511 Pain in right shoulder: Secondary | ICD-10-CM | POA: Diagnosis not present

## 2024-04-18 DIAGNOSIS — I69320 Aphasia following cerebral infarction: Secondary | ICD-10-CM | POA: Diagnosis not present

## 2024-04-18 DIAGNOSIS — R2681 Unsteadiness on feet: Secondary | ICD-10-CM | POA: Diagnosis not present

## 2024-04-18 DIAGNOSIS — R29898 Other symptoms and signs involving the musculoskeletal system: Secondary | ICD-10-CM | POA: Diagnosis not present

## 2024-04-18 DIAGNOSIS — R278 Other lack of coordination: Secondary | ICD-10-CM | POA: Diagnosis not present

## 2024-04-18 DIAGNOSIS — M6281 Muscle weakness (generalized): Secondary | ICD-10-CM | POA: Diagnosis not present

## 2024-04-19 ENCOUNTER — Non-Acute Institutional Stay: Payer: Self-pay | Admitting: Nurse Practitioner

## 2024-04-19 ENCOUNTER — Encounter: Payer: Self-pay | Admitting: Nurse Practitioner

## 2024-04-19 DIAGNOSIS — I10 Essential (primary) hypertension: Secondary | ICD-10-CM

## 2024-04-19 DIAGNOSIS — R001 Bradycardia, unspecified: Secondary | ICD-10-CM | POA: Diagnosis not present

## 2024-04-19 DIAGNOSIS — K219 Gastro-esophageal reflux disease without esophagitis: Secondary | ICD-10-CM | POA: Diagnosis not present

## 2024-04-19 DIAGNOSIS — H5410 Blindness, one eye, low vision other eye, unspecified eyes: Secondary | ICD-10-CM | POA: Diagnosis not present

## 2024-04-19 DIAGNOSIS — E538 Deficiency of other specified B group vitamins: Secondary | ICD-10-CM | POA: Diagnosis not present

## 2024-04-19 DIAGNOSIS — D649 Anemia, unspecified: Secondary | ICD-10-CM

## 2024-04-19 DIAGNOSIS — I639 Cerebral infarction, unspecified: Secondary | ICD-10-CM | POA: Diagnosis not present

## 2024-04-19 DIAGNOSIS — R7989 Other specified abnormal findings of blood chemistry: Secondary | ICD-10-CM | POA: Diagnosis not present

## 2024-04-19 DIAGNOSIS — E7800 Pure hypercholesterolemia, unspecified: Secondary | ICD-10-CM | POA: Diagnosis not present

## 2024-04-19 NOTE — Assessment & Plan Note (Signed)
 controlled blood pressure, on Losartan , CKD Bun/creat 24/1.17 02/22/24<<28/1.12 03/08/24

## 2024-04-19 NOTE — Assessment & Plan Note (Signed)
 Ambulates with walker, no residual weakness.

## 2024-04-19 NOTE — Assessment & Plan Note (Signed)
 normocytic, Fe 27, B12 163, HGb 10.8 02/21/24>> 9.8 03/08/24>>9.7 04/12/24, placed on Vit B12, not on Fe due to intolerance-desire to try NuIron, added folic acid  1mg  every day 03/14/24 2/2 folate 4.2 03/08/24
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
## 2024-04-19 NOTE — Assessment & Plan Note (Signed)
 balance issue, on Vit B12, Vit B12 694, Folate 4.2-added folic acid  1mg  every day 03/24/24.

## 2024-04-19 NOTE — Assessment & Plan Note (Addendum)
 TSH 6.7 02/20/24>>2.84 04/12/24

## 2024-04-19 NOTE — Assessment & Plan Note (Signed)
 on Famotidine , stable

## 2024-04-19 NOTE — Assessment & Plan Note (Signed)
 Sinus bradycardia, s/p Loop recorder insertion 01/02/22, f/u cardiology 03/27/24 no arrhythmias, normal histogram, battery ok, programmed parameters are appropriate. Echo normal LVEF 02/21/24. HR 53 02/19/24 EKG

## 2024-04-19 NOTE — Assessment & Plan Note (Signed)
 LDL 47 02/20/24, on Rosuvastatin 

## 2024-04-21 DIAGNOSIS — M6281 Muscle weakness (generalized): Secondary | ICD-10-CM | POA: Diagnosis not present

## 2024-04-21 DIAGNOSIS — R2689 Other abnormalities of gait and mobility: Secondary | ICD-10-CM | POA: Diagnosis not present

## 2024-04-21 DIAGNOSIS — M25511 Pain in right shoulder: Secondary | ICD-10-CM | POA: Diagnosis not present

## 2024-04-21 DIAGNOSIS — I69320 Aphasia following cerebral infarction: Secondary | ICD-10-CM | POA: Diagnosis not present

## 2024-04-21 DIAGNOSIS — H9113 Presbycusis, bilateral: Secondary | ICD-10-CM | POA: Diagnosis not present

## 2024-04-21 DIAGNOSIS — R278 Other lack of coordination: Secondary | ICD-10-CM | POA: Diagnosis not present

## 2024-04-21 DIAGNOSIS — R29898 Other symptoms and signs involving the musculoskeletal system: Secondary | ICD-10-CM | POA: Diagnosis not present

## 2024-04-21 DIAGNOSIS — R2681 Unsteadiness on feet: Secondary | ICD-10-CM | POA: Diagnosis not present

## 2024-04-25 ENCOUNTER — Ambulatory Visit: Payer: Medicare Other

## 2024-04-25 DIAGNOSIS — M25511 Pain in right shoulder: Secondary | ICD-10-CM | POA: Diagnosis not present

## 2024-04-25 DIAGNOSIS — R29898 Other symptoms and signs involving the musculoskeletal system: Secondary | ICD-10-CM | POA: Diagnosis not present

## 2024-04-25 DIAGNOSIS — R2681 Unsteadiness on feet: Secondary | ICD-10-CM | POA: Diagnosis not present

## 2024-04-25 DIAGNOSIS — I69320 Aphasia following cerebral infarction: Secondary | ICD-10-CM | POA: Diagnosis not present

## 2024-04-25 DIAGNOSIS — R278 Other lack of coordination: Secondary | ICD-10-CM | POA: Diagnosis not present

## 2024-04-25 DIAGNOSIS — M6281 Muscle weakness (generalized): Secondary | ICD-10-CM | POA: Diagnosis not present

## 2024-04-27 DIAGNOSIS — R278 Other lack of coordination: Secondary | ICD-10-CM | POA: Diagnosis not present

## 2024-05-05 DIAGNOSIS — H903 Sensorineural hearing loss, bilateral: Secondary | ICD-10-CM | POA: Diagnosis not present

## 2024-05-05 DIAGNOSIS — R2689 Other abnormalities of gait and mobility: Secondary | ICD-10-CM | POA: Diagnosis not present

## 2024-05-09 ENCOUNTER — Ambulatory Visit

## 2024-05-09 ENCOUNTER — Ambulatory Visit: Payer: Self-pay | Admitting: Cardiology

## 2024-05-09 DIAGNOSIS — I639 Cerebral infarction, unspecified: Secondary | ICD-10-CM | POA: Diagnosis not present

## 2024-05-09 LAB — CUP PACEART REMOTE DEVICE CHECK
Date Time Interrogation Session: 20250713064900
Implantable Pulse Generator Implant Date: 20230309
Pulse Gen Serial Number: 172093

## 2024-05-13 NOTE — Progress Notes (Signed)
 [facility] Summary Report / Loop Recorder

## 2024-05-26 ENCOUNTER — Ambulatory Visit: Payer: Medicare Other

## 2024-06-09 ENCOUNTER — Ambulatory Visit

## 2024-06-16 DIAGNOSIS — H35353 Cystoid macular degeneration, bilateral: Secondary | ICD-10-CM | POA: Diagnosis not present

## 2024-06-16 DIAGNOSIS — H401211 Low-tension glaucoma, right eye, mild stage: Secondary | ICD-10-CM | POA: Diagnosis not present

## 2024-06-16 DIAGNOSIS — H2512 Age-related nuclear cataract, left eye: Secondary | ICD-10-CM | POA: Diagnosis not present

## 2024-06-16 DIAGNOSIS — Z947 Corneal transplant status: Secondary | ICD-10-CM | POA: Diagnosis not present

## 2024-06-16 DIAGNOSIS — H34832 Tributary (branch) retinal vein occlusion, left eye, with macular edema: Secondary | ICD-10-CM | POA: Diagnosis not present

## 2024-06-16 DIAGNOSIS — H35361 Drusen (degenerative) of macula, right eye: Secondary | ICD-10-CM | POA: Diagnosis not present

## 2024-06-16 DIAGNOSIS — H353132 Nonexudative age-related macular degeneration, bilateral, intermediate dry stage: Secondary | ICD-10-CM | POA: Diagnosis not present

## 2024-06-16 DIAGNOSIS — Z961 Presence of intraocular lens: Secondary | ICD-10-CM | POA: Diagnosis not present

## 2024-06-20 ENCOUNTER — Ambulatory Visit (INDEPENDENT_AMBULATORY_CARE_PROVIDER_SITE_OTHER)

## 2024-06-20 DIAGNOSIS — I639 Cerebral infarction, unspecified: Secondary | ICD-10-CM | POA: Diagnosis not present

## 2024-06-21 LAB — CUP PACEART REMOTE DEVICE CHECK
Date Time Interrogation Session: 20250825065400
Implantable Pulse Generator Implant Date: 20230309
Pulse Gen Serial Number: 172093

## 2024-06-22 ENCOUNTER — Ambulatory Visit: Payer: Self-pay | Admitting: Cardiology

## 2024-06-28 ENCOUNTER — Ambulatory Visit: Payer: Medicare Other

## 2024-07-11 ENCOUNTER — Ambulatory Visit

## 2024-07-13 NOTE — Progress Notes (Signed)
 Remote Loop Recorder Transmission

## 2024-07-18 ENCOUNTER — Encounter: Payer: Self-pay | Admitting: Sports Medicine

## 2024-07-18 ENCOUNTER — Non-Acute Institutional Stay: Payer: Self-pay | Admitting: Sports Medicine

## 2024-07-18 DIAGNOSIS — N1831 Chronic kidney disease, stage 3a: Secondary | ICD-10-CM

## 2024-07-18 DIAGNOSIS — E538 Deficiency of other specified B group vitamins: Secondary | ICD-10-CM | POA: Diagnosis not present

## 2024-07-18 DIAGNOSIS — J449 Chronic obstructive pulmonary disease, unspecified: Secondary | ICD-10-CM

## 2024-07-18 DIAGNOSIS — D509 Iron deficiency anemia, unspecified: Secondary | ICD-10-CM

## 2024-07-18 DIAGNOSIS — I639 Cerebral infarction, unspecified: Secondary | ICD-10-CM

## 2024-07-18 DIAGNOSIS — I10 Essential (primary) hypertension: Secondary | ICD-10-CM

## 2024-07-18 DIAGNOSIS — E559 Vitamin D deficiency, unspecified: Secondary | ICD-10-CM | POA: Diagnosis not present
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
## 2024-07-21 ENCOUNTER — Ambulatory Visit

## 2024-07-21 DIAGNOSIS — I639 Cerebral infarction, unspecified: Secondary | ICD-10-CM

## 2024-07-21 LAB — CUP PACEART REMOTE DEVICE CHECK
Date Time Interrogation Session: 20250925005700
Implantable Pulse Generator Implant Date: 20230309
Pulse Gen Serial Number: 172093

## 2024-07-26 DIAGNOSIS — Z8673 Personal history of transient ischemic attack (TIA), and cerebral infarction without residual deficits: Secondary | ICD-10-CM | POA: Diagnosis not present

## 2024-07-26 DIAGNOSIS — I10 Essential (primary) hypertension: Secondary | ICD-10-CM | POA: Diagnosis not present

## 2024-07-26 DIAGNOSIS — M810 Age-related osteoporosis without current pathological fracture: Secondary | ICD-10-CM | POA: Diagnosis not present

## 2024-07-26 DIAGNOSIS — J449 Chronic obstructive pulmonary disease, unspecified: Secondary | ICD-10-CM | POA: Diagnosis not present

## 2024-07-27 NOTE — Progress Notes (Signed)
 Remote Loop Recorder Transmission

## 2024-07-28 ENCOUNTER — Ambulatory Visit: Payer: Medicare Other

## 2024-07-28 ENCOUNTER — Ambulatory Visit: Payer: Self-pay | Admitting: Cardiology

## 2024-08-03 NOTE — Progress Notes (Signed)
 Remote Loop Recorder Transmission

## 2024-08-11 ENCOUNTER — Ambulatory Visit

## 2024-08-22 ENCOUNTER — Ambulatory Visit (INDEPENDENT_AMBULATORY_CARE_PROVIDER_SITE_OTHER)

## 2024-08-22 DIAGNOSIS — G459 Transient cerebral ischemic attack, unspecified: Secondary | ICD-10-CM

## 2024-08-22 LAB — CUP PACEART REMOTE DEVICE CHECK
Date Time Interrogation Session: 20251027040200
Implantable Pulse Generator Implant Date: 20230309
Pulse Gen Serial Number: 172093

## 2024-08-24 ENCOUNTER — Ambulatory Visit: Payer: Self-pay | Admitting: Cardiology

## 2024-08-25 NOTE — Progress Notes (Signed)
 Remote Loop Recorder Transmission

## 2024-08-29 ENCOUNTER — Ambulatory Visit: Payer: Medicare Other

## 2024-09-22 ENCOUNTER — Ambulatory Visit

## 2024-09-22 DIAGNOSIS — G459 Transient cerebral ischemic attack, unspecified: Secondary | ICD-10-CM

## 2024-09-23 LAB — CUP PACEART REMOTE DEVICE CHECK
Date Time Interrogation Session: 20251127020800
Implantable Pulse Generator Implant Date: 20230309
Pulse Gen Serial Number: 172093

## 2024-09-26 ENCOUNTER — Ambulatory Visit: Payer: Self-pay | Admitting: Cardiology

## 2024-09-27 NOTE — Progress Notes (Signed)
 Remote Loop Recorder Transmission

## 2024-10-06 DIAGNOSIS — Z947 Corneal transplant status: Secondary | ICD-10-CM | POA: Diagnosis not present

## 2024-10-06 DIAGNOSIS — H353132 Nonexudative age-related macular degeneration, bilateral, intermediate dry stage: Secondary | ICD-10-CM | POA: Diagnosis not present

## 2024-10-06 DIAGNOSIS — H35353 Cystoid macular degeneration, bilateral: Secondary | ICD-10-CM | POA: Diagnosis not present

## 2024-10-06 DIAGNOSIS — Z961 Presence of intraocular lens: Secondary | ICD-10-CM | POA: Diagnosis not present

## 2024-10-06 DIAGNOSIS — H401211 Low-tension glaucoma, right eye, mild stage: Secondary | ICD-10-CM | POA: Diagnosis not present

## 2024-10-06 DIAGNOSIS — H2512 Age-related nuclear cataract, left eye: Secondary | ICD-10-CM | POA: Diagnosis not present

## 2024-10-10 ENCOUNTER — Encounter: Payer: Self-pay | Admitting: Nurse Practitioner

## 2024-10-10 ENCOUNTER — Non-Acute Institutional Stay: Payer: Self-pay | Admitting: Nurse Practitioner

## 2024-10-10 DIAGNOSIS — I10 Essential (primary) hypertension: Secondary | ICD-10-CM | POA: Diagnosis not present

## 2024-10-10 DIAGNOSIS — I872 Venous insufficiency (chronic) (peripheral): Secondary | ICD-10-CM | POA: Insufficient documentation

## 2024-10-10 DIAGNOSIS — R001 Bradycardia, unspecified: Secondary | ICD-10-CM

## 2024-10-10 DIAGNOSIS — D649 Anemia, unspecified: Secondary | ICD-10-CM | POA: Diagnosis not present

## 2024-10-10 DIAGNOSIS — Z8673 Personal history of transient ischemic attack (TIA), and cerebral infarction without residual deficits: Secondary | ICD-10-CM

## 2024-10-10 DIAGNOSIS — K219 Gastro-esophageal reflux disease without esophagitis: Secondary | ICD-10-CM

## 2024-10-10 DIAGNOSIS — N1831 Chronic kidney disease, stage 3a: Secondary | ICD-10-CM

## 2024-10-10 DIAGNOSIS — E538 Deficiency of other specified B group vitamins: Secondary | ICD-10-CM

## 2024-10-10 DIAGNOSIS — J449 Chronic obstructive pulmonary disease, unspecified: Secondary | ICD-10-CM

## 2024-10-10 NOTE — Assessment & Plan Note (Signed)
 Vit B12 deficiency, balance issue, on Vit B12, Vit B12 694, Folate 4.2-added folic acid  1mg  every day 03/24/24.  Update Vit B12

## 2024-10-10 NOTE — Assessment & Plan Note (Signed)
 stable, on Budesonide /formoterol  inh, Fexofenadine . Prn Nystatin  S/S

## 2024-10-10 NOTE — Assessment & Plan Note (Signed)
 No focal weakness residual Continue aspirin /Plavix 

## 2024-10-10 NOTE — Assessment & Plan Note (Signed)
 CBC/diff, CMP/eGFR, TSH, Hgb A1c, lipids, Vit B12, Vit D  BLE, varicose venins, brownish dark discoloration, improved when BLE elevated, dorsalis pedis pulses present, denies pain.

## 2024-10-10 NOTE — Assessment & Plan Note (Signed)
 Bun/creat 21/1.08 07/26/24

## 2024-10-10 NOTE — Assessment & Plan Note (Signed)
 controlled blood pressure, on Losartan 

## 2024-10-10 NOTE — Assessment & Plan Note (Signed)
 Stable

## 2024-10-10 NOTE — Assessment & Plan Note (Signed)
 Hgb 12.3 07/26/24, on Fe, Vit B12

## 2024-10-10 NOTE — Assessment & Plan Note (Signed)
Stable, continue Famotidine.  
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
## 2024-10-23 ENCOUNTER — Ambulatory Visit

## 2024-10-23 DIAGNOSIS — G459 Transient cerebral ischemic attack, unspecified: Secondary | ICD-10-CM

## 2024-10-24 LAB — CUP PACEART REMOTE DEVICE CHECK
Date Time Interrogation Session: 20251228041300
Implantable Pulse Generator Implant Date: 20230309
Pulse Gen Serial Number: 172093

## 2024-10-26 ENCOUNTER — Ambulatory Visit: Payer: Self-pay | Admitting: Cardiovascular Disease

## 2024-10-31 NOTE — Progress Notes (Signed)
 Remote Loop Recorder Transmission

## 2024-11-23 ENCOUNTER — Encounter

## 2024-11-23 DIAGNOSIS — I639 Cerebral infarction, unspecified: Secondary | ICD-10-CM

## 2024-11-23 LAB — CUP PACEART REMOTE DEVICE CHECK
Date Time Interrogation Session: 20260128041900
Implantable Pulse Generator Implant Date: 20230309
Pulse Gen Serial Number: 172093

## 2024-11-24 ENCOUNTER — Ambulatory Visit: Payer: Self-pay | Admitting: Cardiovascular Disease

## 2024-11-29 ENCOUNTER — Encounter: Payer: Self-pay | Admitting: Adult Health

## 2024-11-29 ENCOUNTER — Non-Acute Institutional Stay: Payer: Self-pay | Admitting: Adult Health

## 2024-11-29 DIAGNOSIS — H5410 Blindness, one eye, low vision other eye, unspecified eyes: Secondary | ICD-10-CM

## 2024-12-01 NOTE — Progress Notes (Signed)
 Remote Loop Recorder Transmission

## 2024-12-24 ENCOUNTER — Encounter

## 2025-01-24 ENCOUNTER — Encounter

## 2025-02-24 ENCOUNTER — Encounter

## 2025-03-27 ENCOUNTER — Encounter

## 2025-04-27 ENCOUNTER — Encounter

## 2025-05-28 ENCOUNTER — Encounter

## 2025-06-28 ENCOUNTER — Encounter

## 2025-07-29 ENCOUNTER — Encounter

## 2025-08-29 ENCOUNTER — Encounter

## 2025-09-29 ENCOUNTER — Encounter
